# Patient Record
Sex: Male | Born: 1960 | Race: Black or African American | Hispanic: No | Marital: Single | Smoking: Current every day smoker
Health system: Southern US, Community
[De-identification: ages and names within clinical notes are randomized; demographics above are authoritative.]

## PROBLEM LIST (undated history)

## (undated) DIAGNOSIS — E785 Hyperlipidemia, unspecified: Secondary | ICD-10-CM

## (undated) DIAGNOSIS — I10 Essential (primary) hypertension: Secondary | ICD-10-CM

## (undated) DIAGNOSIS — I517 Cardiomegaly: Secondary | ICD-10-CM

## (undated) DIAGNOSIS — G4730 Sleep apnea, unspecified: Secondary | ICD-10-CM

## (undated) DIAGNOSIS — K219 Gastro-esophageal reflux disease without esophagitis: Secondary | ICD-10-CM

## (undated) DIAGNOSIS — G4733 Obstructive sleep apnea (adult) (pediatric): Secondary | ICD-10-CM

## (undated) DIAGNOSIS — I209 Angina pectoris, unspecified: Secondary | ICD-10-CM

## (undated) DIAGNOSIS — E042 Nontoxic multinodular goiter: Secondary | ICD-10-CM

## (undated) DIAGNOSIS — R9431 Abnormal electrocardiogram [ECG] [EKG]: Secondary | ICD-10-CM

## (undated) DIAGNOSIS — Z0000 Encounter for general adult medical examination without abnormal findings: Secondary | ICD-10-CM

## (undated) DIAGNOSIS — N189 Chronic kidney disease, unspecified: Secondary | ICD-10-CM

## (undated) DIAGNOSIS — Z8719 Personal history of other diseases of the digestive system: Secondary | ICD-10-CM

## (undated) DIAGNOSIS — K649 Unspecified hemorrhoids: Secondary | ICD-10-CM

## (undated) DIAGNOSIS — R079 Chest pain, unspecified: Secondary | ICD-10-CM

## (undated) DIAGNOSIS — R42 Dizziness and giddiness: Secondary | ICD-10-CM

## (undated) DIAGNOSIS — E119 Type 2 diabetes mellitus without complications: Secondary | ICD-10-CM

## (undated) HISTORY — DX: Obstructive sleep apnea (adult) (pediatric): G47.33

## (undated) HISTORY — PX: POLYPECTOMY: SHX149

## (undated) HISTORY — DX: Essential (primary) hypertension: I10

## (undated) HISTORY — DX: Personal history of other diseases of the digestive system: Z87.19

## (undated) HISTORY — DX: Chest pain, unspecified: R07.9

## (undated) HISTORY — DX: Hyperlipidemia, unspecified: E78.5

## (undated) HISTORY — DX: Dizziness and giddiness: R42

## (undated) HISTORY — DX: Cardiomegaly: I51.7

## (undated) HISTORY — DX: Nontoxic multinodular goiter: E04.2

## (undated) HISTORY — PX: COLONOSCOPY: SHX174

## (undated) HISTORY — DX: Sleep apnea, unspecified: G47.30

## (undated) HISTORY — DX: Gastro-esophageal reflux disease without esophagitis: K21.9

## (undated) HISTORY — DX: Abnormal electrocardiogram (ECG) (EKG): R94.31

## (undated) HISTORY — DX: Type 2 diabetes mellitus without complications: E11.9

## (undated) HISTORY — DX: Unspecified hemorrhoids: K64.9

## (undated) HISTORY — DX: Encounter for general adult medical examination without abnormal findings: Z00.00

---

## 2002-01-07 ENCOUNTER — Inpatient Hospital Stay (HOSPITAL_COMMUNITY): Admission: EM | Admit: 2002-01-07 | Discharge: 2002-01-08 | Payer: Self-pay | Admitting: *Deleted

## 2002-03-15 ENCOUNTER — Emergency Department (HOSPITAL_COMMUNITY): Admission: EM | Admit: 2002-03-15 | Discharge: 2002-03-15 | Payer: Self-pay | Admitting: Emergency Medicine

## 2002-03-27 ENCOUNTER — Emergency Department (HOSPITAL_COMMUNITY): Admission: EM | Admit: 2002-03-27 | Discharge: 2002-03-27 | Payer: Self-pay | Admitting: Emergency Medicine

## 2002-03-27 ENCOUNTER — Encounter: Payer: Self-pay | Admitting: Emergency Medicine

## 2002-05-10 DIAGNOSIS — R42 Dizziness and giddiness: Secondary | ICD-10-CM

## 2002-05-10 HISTORY — DX: Dizziness and giddiness: R42

## 2006-08-11 DIAGNOSIS — R9431 Abnormal electrocardiogram [ECG] [EKG]: Secondary | ICD-10-CM

## 2006-08-11 HISTORY — DX: Abnormal electrocardiogram (ECG) (EKG): R94.31

## 2007-06-08 ENCOUNTER — Emergency Department (HOSPITAL_COMMUNITY): Admission: EM | Admit: 2007-06-08 | Discharge: 2007-06-08 | Payer: Self-pay | Admitting: Emergency Medicine

## 2007-06-08 IMAGING — CR DG CHEST 1V PORT
1 series · 1 of 1 positions shown · non-contrast
Comparison: Report of a prior study [DATE]

CLINICAL DATA: Chest pain.
 PORTABLE CHEST - 1 VIEW:

[AP]
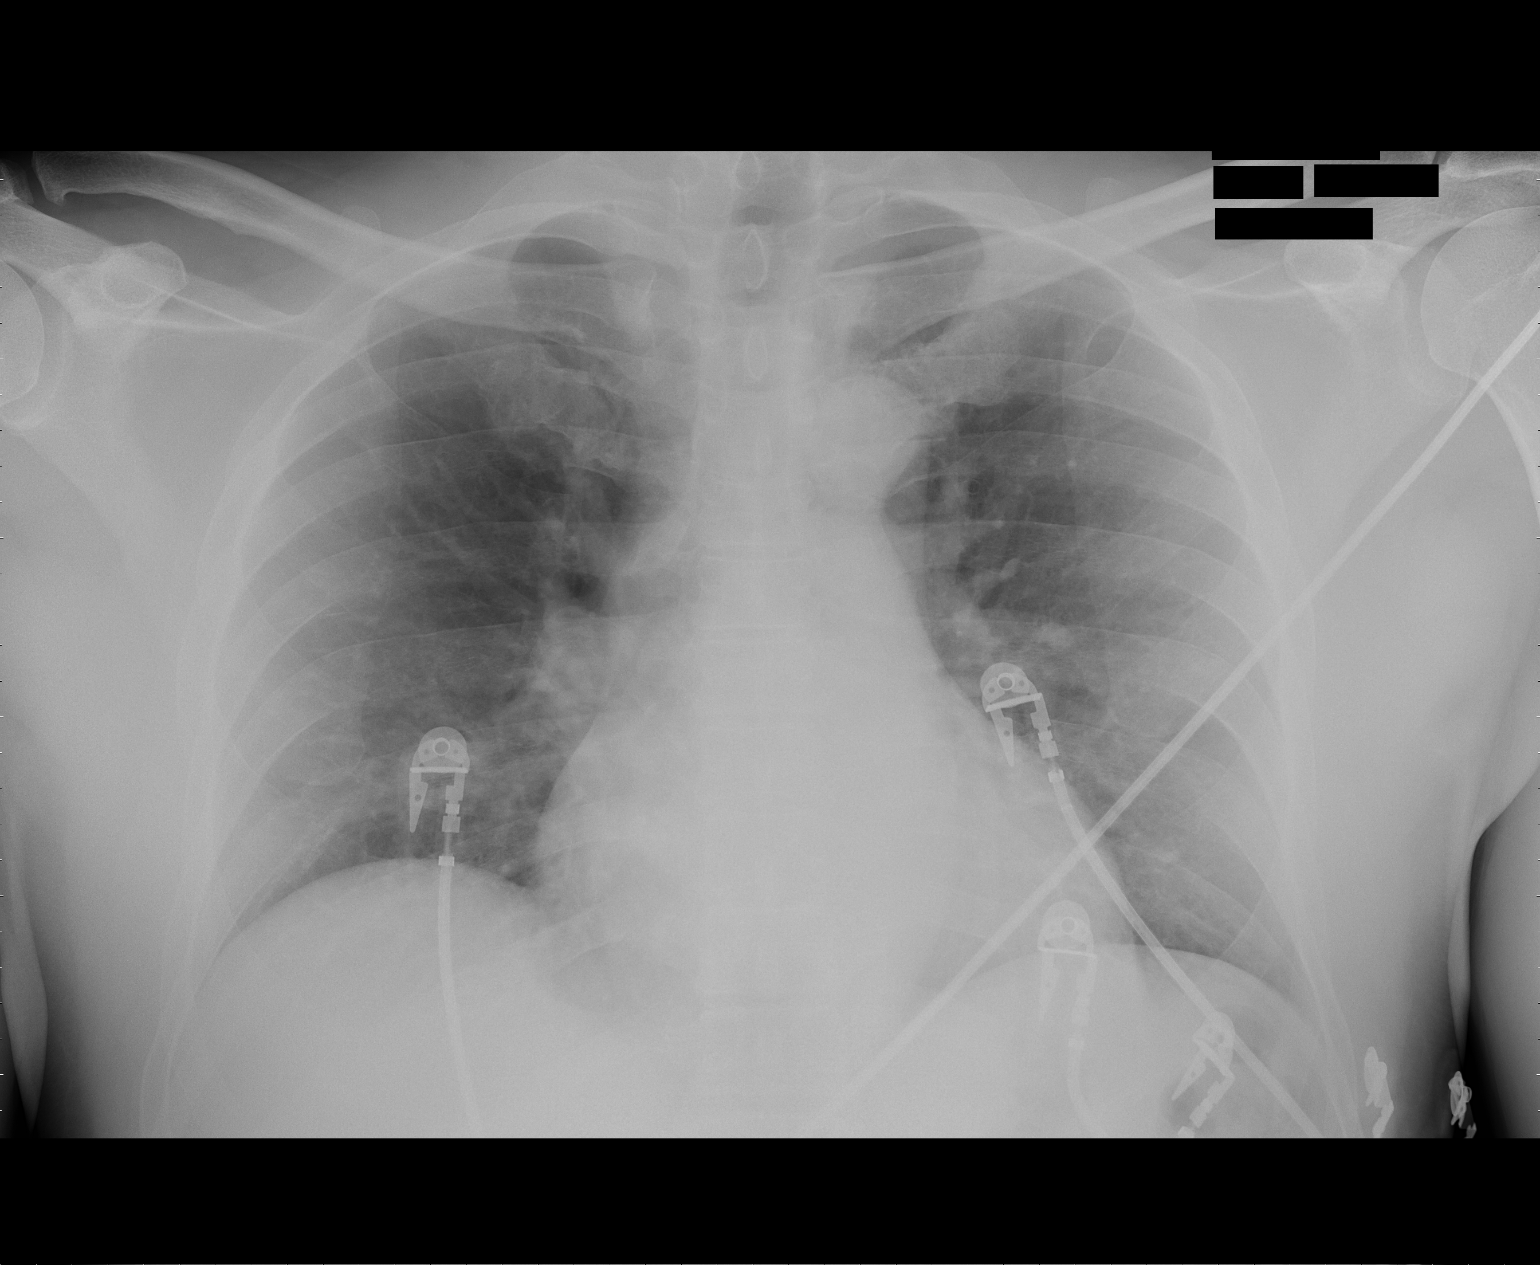

[1 of 1 positions shown; findings below may reference images not displayed]

FINDINGS: Suboptimal level of inspiration.  Heart size within normal limits considering low level of inspiration and AP projection.  Lungs clear.  No heart failure.
IMPRESSION: Suboptimal inspiration ? no active disease.

## 2007-06-09 DIAGNOSIS — R079 Chest pain, unspecified: Secondary | ICD-10-CM

## 2007-06-09 HISTORY — DX: Chest pain, unspecified: R07.9

## 2008-01-14 ENCOUNTER — Ambulatory Visit: Payer: Self-pay | Admitting: Internal Medicine

## 2008-01-14 DIAGNOSIS — G4733 Obstructive sleep apnea (adult) (pediatric): Secondary | ICD-10-CM | POA: Insufficient documentation

## 2008-01-14 DIAGNOSIS — I10 Essential (primary) hypertension: Secondary | ICD-10-CM

## 2008-01-14 HISTORY — DX: Essential (primary) hypertension: I10

## 2008-01-14 HISTORY — DX: Obstructive sleep apnea (adult) (pediatric): G47.33

## 2008-01-14 LAB — CONVERTED CEMR LAB
ALT: 30 units/L (ref 0–53)
AST: 26 units/L (ref 0–37)
Albumin: 4.1 g/dL (ref 3.5–5.2)
Alkaline Phosphatase: 84 units/L (ref 39–117)
BUN: 10 mg/dL (ref 6–23)
Basophils Absolute: 0 10*3/uL (ref 0.0–0.1)
Basophils Relative: 0.7 % (ref 0.0–3.0)
Bilirubin Urine: NEGATIVE
Bilirubin, Direct: 0.2 mg/dL (ref 0.0–0.3)
CO2: 30 meq/L (ref 19–32)
Calcium: 9.3 mg/dL (ref 8.4–10.5)
Chloride: 108 meq/L (ref 96–112)
Cholesterol: 204 mg/dL (ref 0–200)
Creatinine, Ser: 0.9 mg/dL (ref 0.4–1.5)
Crystals: NEGATIVE
Direct LDL: 117.9 mg/dL
Eosinophils Absolute: 0.4 10*3/uL (ref 0.0–0.7)
Eosinophils Relative: 6.2 % — ABNORMAL HIGH (ref 0.0–5.0)
GFR calc Af Amer: 116 mL/min
GFR calc non Af Amer: 96 mL/min
Glucose, Bld: 102 mg/dL — ABNORMAL HIGH (ref 70–99)
HCT: 43.8 % (ref 39.0–52.0)
HDL: 42.2 mg/dL (ref >39.0)
Hemoglobin, Urine: NEGATIVE
Hemoglobin: 15 g/dL (ref 13.0–17.0)
Ketones, ur: NEGATIVE mg/dL
Lymphocytes Relative: 36.3 % (ref 12.0–46.0)
MCHC: 34.1 g/dL (ref 30.0–36.0)
MCV: 95.6 fL (ref 78.0–100.0)
Monocytes Absolute: 1 10*3/uL (ref 0.1–1.0)
Monocytes Relative: 16 % — ABNORMAL HIGH (ref 3.0–12.0)
Neutro Abs: 2.5 10*3/uL (ref 1.4–7.7)
Neutrophils Relative %: 40.8 % — ABNORMAL LOW (ref 43.0–77.0)
Nitrite: NEGATIVE
PSA: 0.52 ng/mL (ref 0.10–4.00)
Platelets: 250 10*3/uL (ref 150–400)
Potassium: 3.8 meq/L (ref 3.5–5.1)
RBC / HPF: NONE SEEN
RBC: 4.58 M/uL (ref 4.22–5.81)
RDW: 13 % (ref 11.5–14.6)
Sodium: 143 meq/L (ref 135–145)
Specific Gravity, Urine: 1.025 (ref 1.000–1.03)
TSH: 0.74 microintl units/mL (ref 0.35–5.50)
Total Bilirubin: 0.8 mg/dL (ref 0.3–1.2)
Total CHOL/HDL Ratio: 4.8
Total Protein, Urine: NEGATIVE mg/dL
Total Protein: 6.9 g/dL (ref 6.0–8.3)
Triglycerides: 131 mg/dL (ref 0–149)
Urine Glucose: NEGATIVE mg/dL
Urobilinogen, UA: 0.2 (ref 0.0–1.0)
VLDL: 26 mg/dL (ref 0–40)
WBC: 6.1 10*3/uL (ref 4.5–10.5)
pH: 5.5 (ref 5.0–8.0)

## 2008-08-13 DIAGNOSIS — G4730 Sleep apnea, unspecified: Secondary | ICD-10-CM

## 2008-08-13 HISTORY — DX: Sleep apnea, unspecified: G47.30

## 2008-10-20 ENCOUNTER — Observation Stay (HOSPITAL_COMMUNITY): Admission: EM | Admit: 2008-10-20 | Discharge: 2008-10-23 | Payer: Self-pay | Admitting: Emergency Medicine

## 2008-10-20 IMAGING — CR DG CHEST 2V
2 series · 2 of 2 positions shown · non-contrast
Comparison: [DATE]

CLINICAL DATA: Chest pain

CHEST - 2 VIEW

[w chest pa]
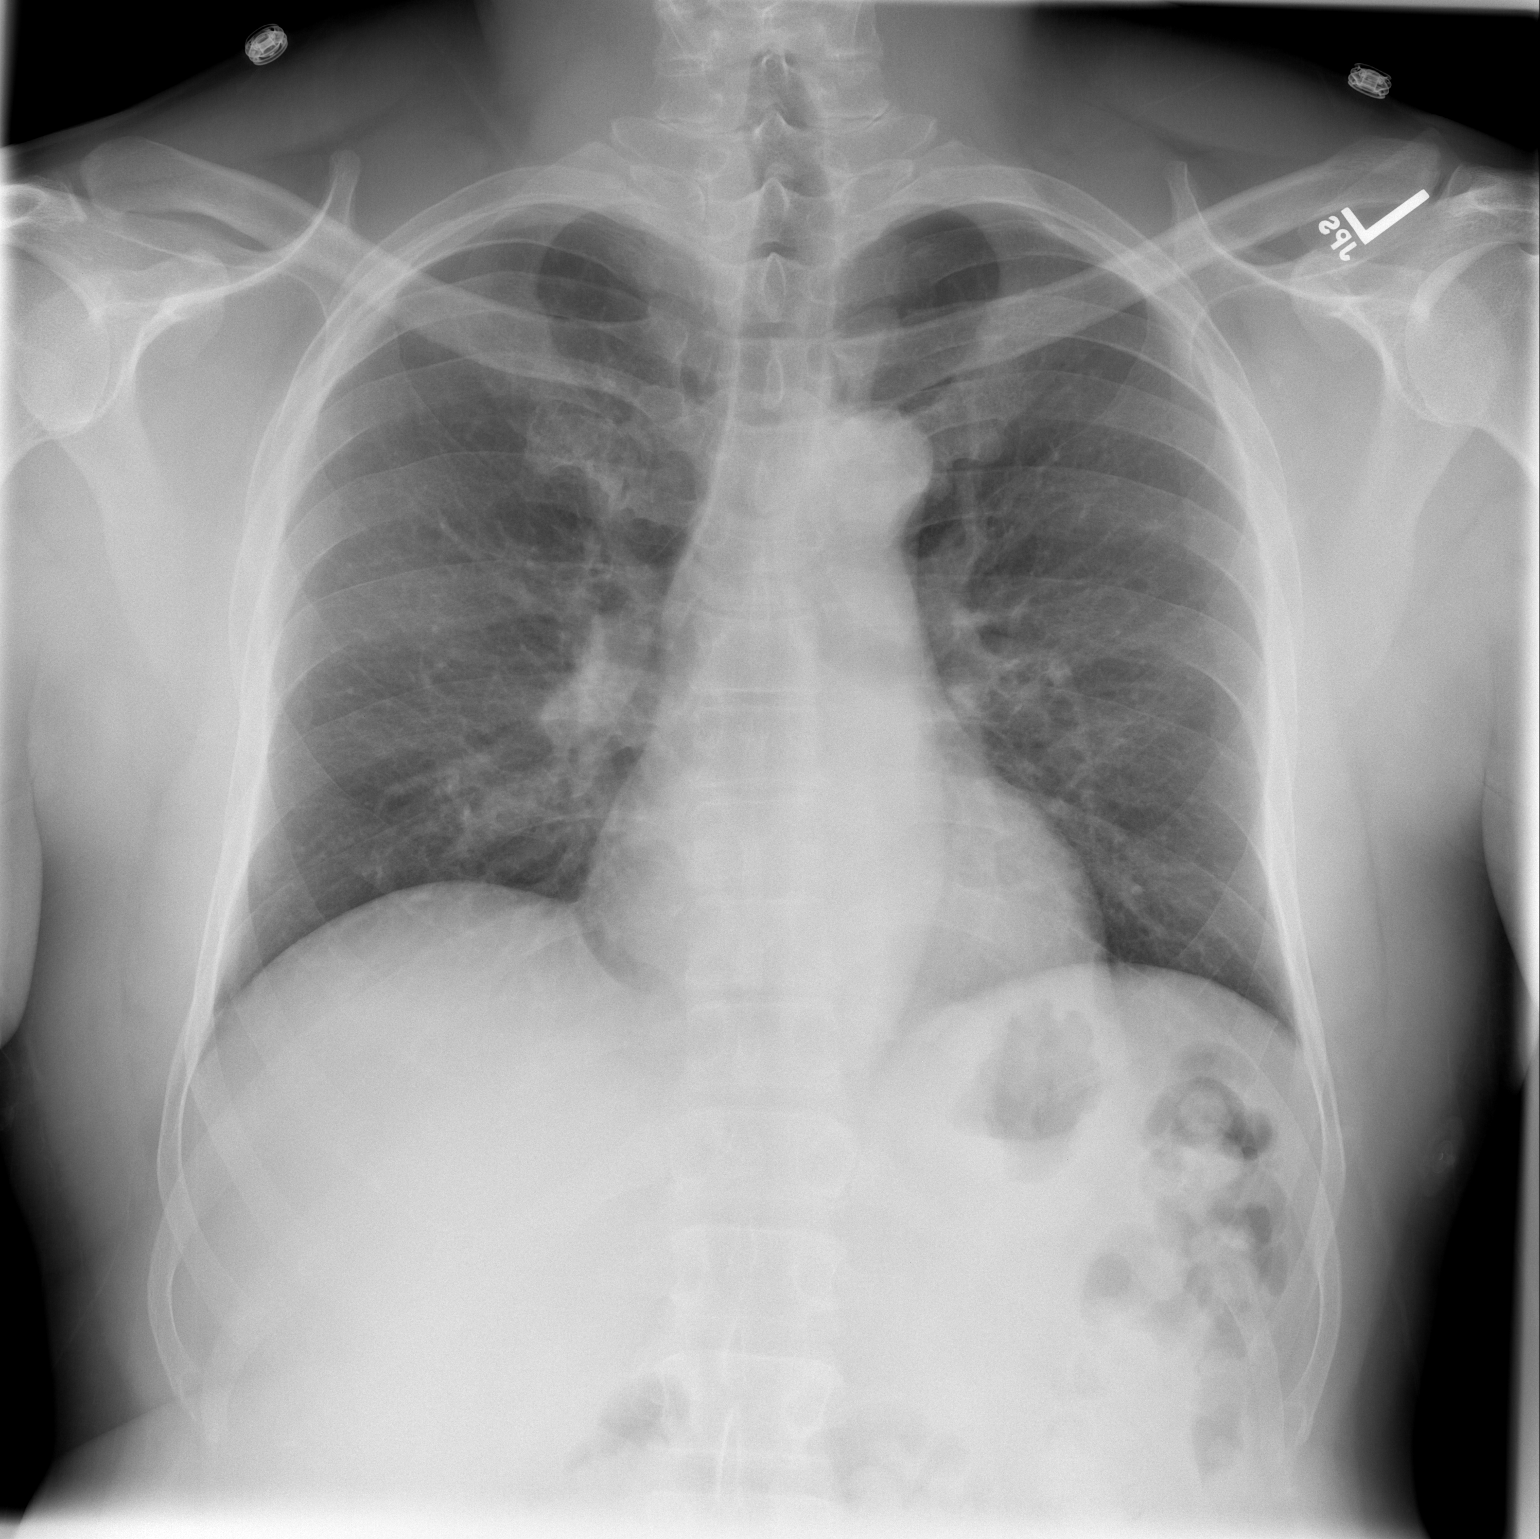

[w chest lat]
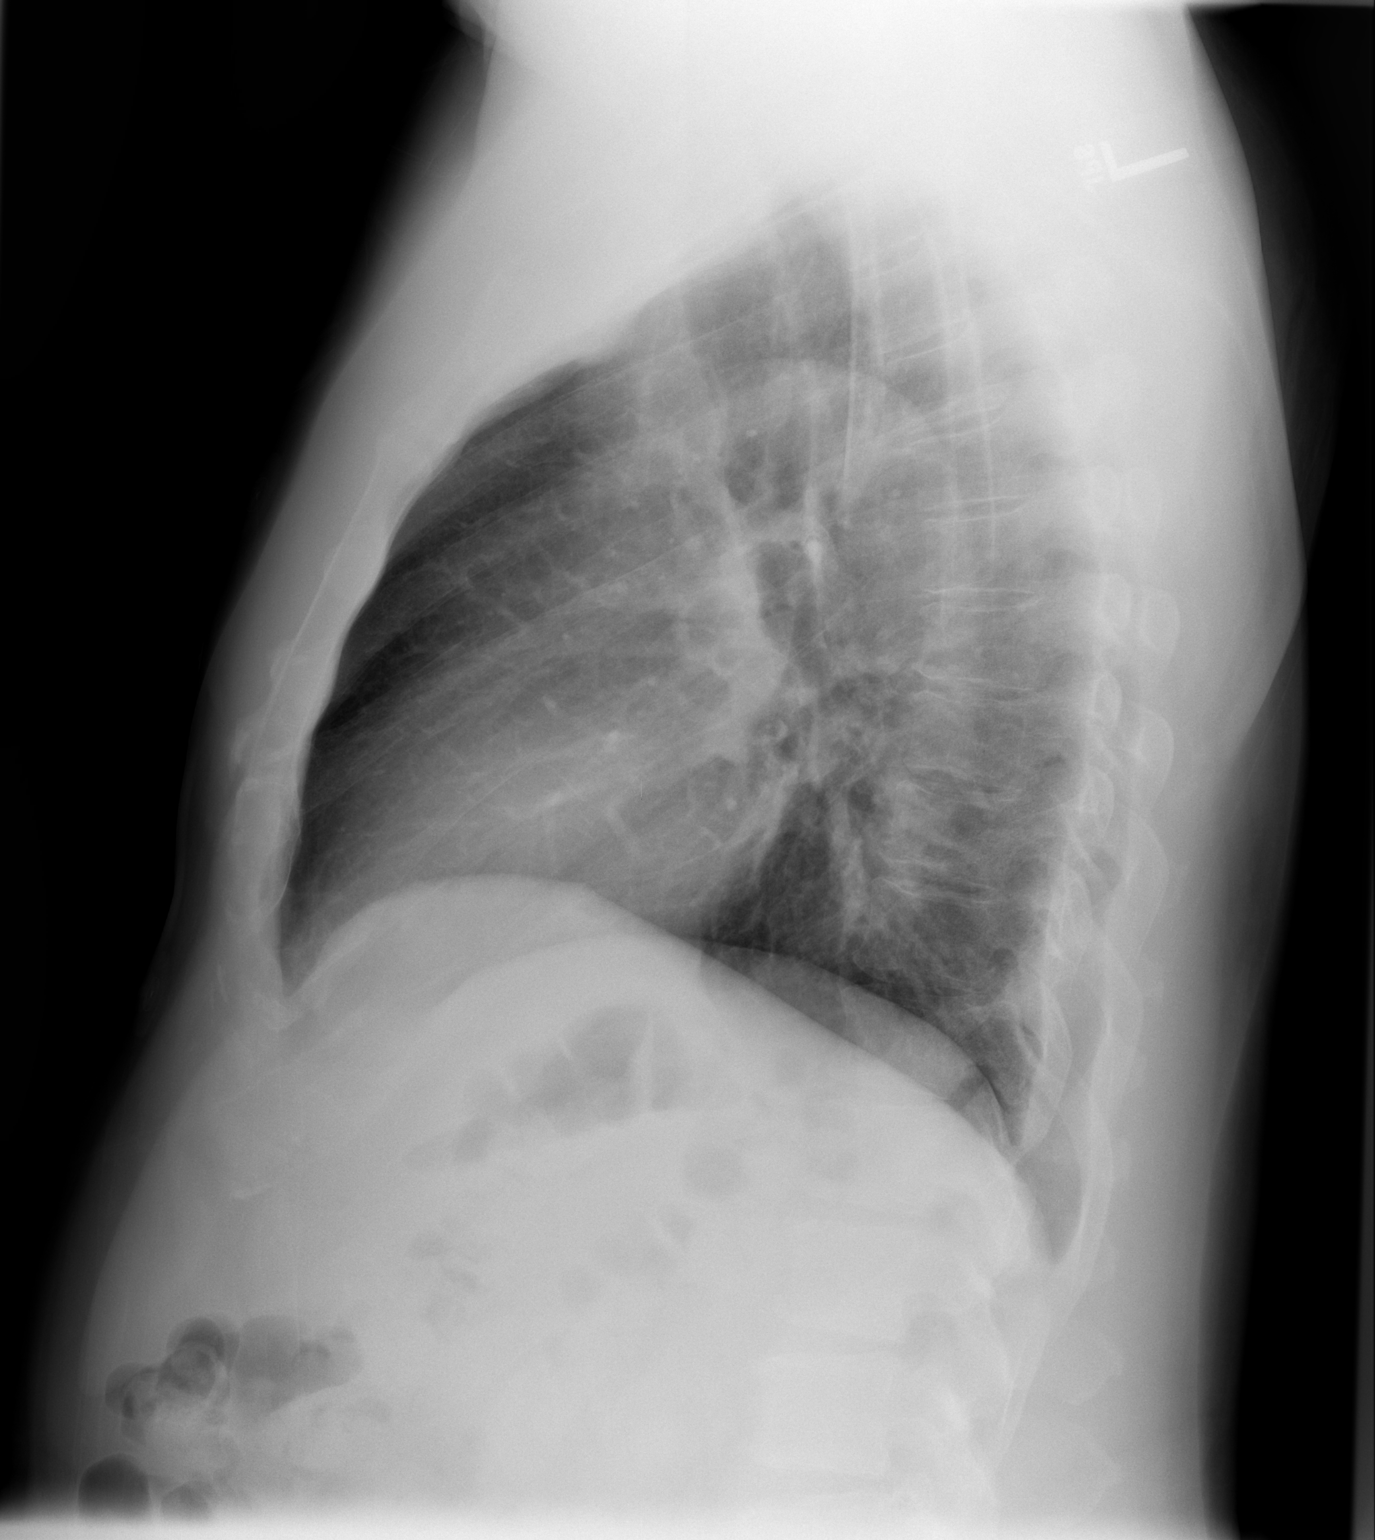

[2 of 2 positions shown; findings below may reference images not displayed]

FINDINGS: Cardiomediastinal silhouette is stable.  No acute
infiltrate or pleural effusion.  No pulmonary edema.  Bony thorax
is stable.
IMPRESSION: No active disease.  No significant change.

## 2008-10-23 HISTORY — PX: CARDIAC CATHETERIZATION: SHX172

## 2009-08-14 ENCOUNTER — Encounter: Payer: Self-pay | Admitting: Internal Medicine

## 2009-08-24 ENCOUNTER — Ambulatory Visit: Payer: Self-pay | Admitting: Internal Medicine

## 2009-08-25 LAB — CONVERTED CEMR LAB
ALT: 38 units/L (ref 0–53)
AST: 28 units/L (ref 0–37)
Albumin: 4.4 g/dL (ref 3.5–5.2)
Alkaline Phosphatase: 89 units/L (ref 39–117)
BUN: 12 mg/dL (ref 6–23)
Basophils Absolute: 0.1 10*3/uL (ref 0.0–0.1)
Basophils Relative: 0.8 % (ref 0.0–3.0)
Bilirubin, Direct: 0.1 mg/dL (ref 0.0–0.3)
CO2: 28 meq/L (ref 19–32)
Calcium: 9.4 mg/dL (ref 8.4–10.5)
Chloride: 110 meq/L (ref 96–112)
Cholesterol: 200 mg/dL (ref 0–200)
Creatinine, Ser: 0.9 mg/dL (ref 0.4–1.5)
Eosinophils Absolute: 0.3 10*3/uL (ref 0.0–0.7)
Eosinophils Relative: 4.9 % (ref 0.0–5.0)
GFR calc non Af Amer: 113.29 mL/min (ref >60)
Glucose, Bld: 130 mg/dL — ABNORMAL HIGH (ref 70–99)
HCT: 43.1 % (ref 39.0–52.0)
HDL: 42.1 mg/dL (ref >39.00)
Hemoglobin: 14.9 g/dL (ref 13.0–17.0)
LDL Cholesterol: 132 mg/dL — ABNORMAL HIGH (ref 0–99)
Lymphocytes Relative: 31.1 % (ref 12.0–46.0)
Lymphs Abs: 2.2 10*3/uL (ref 0.7–4.0)
MCHC: 34.6 g/dL (ref 30.0–36.0)
MCV: 95.7 fL (ref 78.0–100.0)
Monocytes Absolute: 0.8 10*3/uL (ref 0.1–1.0)
Monocytes Relative: 10.7 % (ref 3.0–12.0)
Neutro Abs: 3.7 10*3/uL (ref 1.4–7.7)
Neutrophils Relative %: 52.5 % (ref 43.0–77.0)
PSA: 0.32 ng/mL (ref 0.10–4.00)
Platelets: 263 10*3/uL (ref 150.0–400.0)
Potassium: 3.9 meq/L (ref 3.5–5.1)
RBC: 4.5 M/uL (ref 4.22–5.81)
RDW: 13.6 % (ref 11.5–14.6)
Sodium: 144 meq/L (ref 135–145)
TSH: 0.59 microintl units/mL (ref 0.35–5.50)
Total Bilirubin: 0.8 mg/dL (ref 0.3–1.2)
Total CHOL/HDL Ratio: 5
Total Protein: 7 g/dL (ref 6.0–8.3)
Triglycerides: 132 mg/dL (ref 0.0–149.0)
VLDL: 26.4 mg/dL (ref 0.0–40.0)
WBC: 7 10*3/uL (ref 4.5–10.5)

## 2009-08-29 ENCOUNTER — Ambulatory Visit: Payer: Self-pay | Admitting: Internal Medicine

## 2009-08-29 DIAGNOSIS — E739 Lactose intolerance, unspecified: Secondary | ICD-10-CM | POA: Insufficient documentation

## 2009-08-29 DIAGNOSIS — E785 Hyperlipidemia, unspecified: Secondary | ICD-10-CM

## 2009-08-29 DIAGNOSIS — E119 Type 2 diabetes mellitus without complications: Secondary | ICD-10-CM | POA: Insufficient documentation

## 2009-08-29 HISTORY — DX: Hyperlipidemia, unspecified: E78.5

## 2009-08-29 HISTORY — DX: Type 2 diabetes mellitus without complications: E11.9

## 2009-08-31 ENCOUNTER — Encounter (INDEPENDENT_AMBULATORY_CARE_PROVIDER_SITE_OTHER): Payer: Self-pay | Admitting: *Deleted

## 2009-09-03 ENCOUNTER — Telehealth: Payer: Self-pay | Admitting: Internal Medicine

## 2009-09-17 ENCOUNTER — Encounter: Payer: Self-pay | Admitting: Internal Medicine

## 2009-09-21 ENCOUNTER — Encounter (INDEPENDENT_AMBULATORY_CARE_PROVIDER_SITE_OTHER): Payer: Self-pay | Admitting: *Deleted

## 2009-09-24 ENCOUNTER — Ambulatory Visit: Payer: Self-pay | Admitting: Gastroenterology

## 2009-10-04 ENCOUNTER — Ambulatory Visit: Payer: Self-pay | Admitting: Gastroenterology

## 2009-10-04 LAB — HM COLONOSCOPY

## 2009-10-09 ENCOUNTER — Encounter: Payer: Self-pay | Admitting: Gastroenterology

## 2009-10-29 ENCOUNTER — Encounter: Payer: Self-pay | Admitting: Internal Medicine

## 2009-12-05 ENCOUNTER — Ambulatory Visit: Payer: Self-pay | Admitting: Internal Medicine

## 2009-12-05 DIAGNOSIS — IMO0002 Reserved for concepts with insufficient information to code with codable children: Secondary | ICD-10-CM | POA: Insufficient documentation

## 2010-02-04 ENCOUNTER — Ambulatory Visit: Payer: Self-pay | Admitting: Internal Medicine

## 2010-02-04 LAB — CONVERTED CEMR LAB
CO2: 26 meq/L (ref 19–32)
Chloride: 107 meq/L (ref 96–112)
Glucose, Bld: 144 mg/dL — ABNORMAL HIGH (ref 70–99)
HDL: 42.1 mg/dL (ref >39.00)
Hgb A1c MFr Bld: 7.2 % — ABNORMAL HIGH (ref 4.6–6.5)
LDL Cholesterol: 109 mg/dL — ABNORMAL HIGH (ref 0–99)
Potassium: 3.9 meq/L (ref 3.5–5.1)
Sodium: 144 meq/L (ref 135–145)
Total CHOL/HDL Ratio: 4
VLDL: 22.8 mg/dL (ref 0.0–40.0)

## 2010-02-06 ENCOUNTER — Ambulatory Visit: Payer: Self-pay | Admitting: Internal Medicine

## 2010-02-06 DIAGNOSIS — R51 Headache: Secondary | ICD-10-CM | POA: Insufficient documentation

## 2010-02-06 DIAGNOSIS — R519 Headache, unspecified: Secondary | ICD-10-CM | POA: Insufficient documentation

## 2010-02-18 ENCOUNTER — Ambulatory Visit: Payer: Self-pay | Admitting: Internal Medicine

## 2010-03-04 ENCOUNTER — Ambulatory Visit: Payer: Self-pay | Admitting: Internal Medicine

## 2010-04-28 LAB — CONVERTED CEMR LAB: Hgb A1c MFr Bld: 6.8 % — ABNORMAL HIGH (ref 4.6–6.5)

## 2010-04-30 NOTE — Miscellaneous (Signed)
Summary: [facility]  Clinical Lists Changes  Medications: Added new medication of MOVIPREP 100 GM  SOLR (PEG-KCL-NACL-NASULF-NA ASC-C) As per prep instructions. - Signed Rx of MOVIPREP 100 GM  SOLR (PEG-KCL-NACL-NASULF-NA ASC-C) As per prep instructions.;  #1 x 0;  Signed;  Entered by: Ezra Sites RN;  Authorized by: Louis Meckel MD;  Method used: [facility]  [address] [phone]*, [address] [place], [place], [place]  [address], Ph: [phone] or [phone], Fax: [phone] Observations: Added new observation of NKA: T (09/24/2009 16:20)    Prescriptions: MOVIPREP 100 GM  SOLR (PEG-KCL-NACL-NASULF-NA ASC-C) As per prep instructions.  #1 x 0   Entered by:   Ezra Sites RN   Authorized by:   Louis Meckel MD   Signed by:   Ezra Sites RN on 09/24/2009   Method used:   [facility]        [facility]  [address] [phone]* (retail)       [address]       [place], [place]  [address]       Ph: [phone] or [phone]       Fax: [phone]   RxID:   [id number]

## 2010-04-30 NOTE — Letter (Signed)
Summary: Previsit letter  [facility]  [address] [place], [place] [address]   Phone: [phone]  Fax: [phone]       08/31/2009 MRN: [id number]  [facility] [address] [place], [place]  [address]  Dear [name],  Welcome to the Gastroenterology Division at [facility].    You are scheduled to see a nurse for your pre-procedure visit on 09-24-09 at 4:30p.m. on the 3rd floor at [facility], [address]  We ask that you try to arrive at our office 15 minutes prior to your appointment time to allow for check-in.  Your nurse visit will consist of discussing your medical and surgical history, your immediate family medical history, and your medications.    Please bring a complete list of all your medications or, if you prefer, bring the medication bottles and we will list them.  We will need to be aware of both prescribed and over the counter drugs.  We will need to know exact dosage information as well.  If you are on blood thinners (Coumadin, Plavix, Aggrenox, Ticlid, etc.) please call our office today/prior to your appointment, as we need to consult with your physician about holding your medication.   Please be prepared to read and sign documents such as consent forms, a financial agreement, and acknowledgement forms.  If necessary, and with your consent, a friend or relative is welcome to sit-in on the nurse visit with you.  Please bring your insurance card so that we may make a copy of it.  If your insurance requires a referral to see a specialist, please bring your referral form from your primary care physician.  No co-pay is required for this nurse visit.     If you cannot keep your appointment, please call [phone] to cancel or reschedule prior to your appointment date.  This allows [place] the opportunity to schedule an appointment for another patient in need of care.    Thank you for choosing [facility] for your  medical needs.  We appreciate the opportunity to care for you.  Please visit [place] at our website  to learn more about our practice.                     Sincerely.                                                                                                                   The Gastroenterology Division
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
## 2010-04-30 NOTE — Progress Notes (Signed)
  Phone Note Call from Patient   Caller: Patient Summary of Call: Patient called upon receiving letter to call office about labs. Informed patient of lab information that has new diabetes no new medication at this time. Dr. Jonny Ruiz request to see pt for ROV in 6 months (not one year) with labs prior hgba1c, Lipids, BMET 250.02 (scheduled 03/04/2010) and pt did schedule ROV for December 2011. Initial call taken by: Scharlene Gloss,  September 03, 2009 10:10 AM

## 2010-04-30 NOTE — Letter (Signed)
Summary: Patient Notice- Polyp Results  [facility]  [address] [place], [place] [address]   Phone: [phone]  Fax: [phone]        October 09, 2009 MRN: [id number]    [facility] [address] [place], [place]  [address]    Dear [name],  I am pleased to inform you that the colon polyp(s) removed during your recent colonoscopy was (were) found to be benign (no cancer detected) upon pathologic examination.  I recommend you have a repeat colonoscopy examination in 3_ years to look for recurrent polyps, as having colon polyps increases your risk for having recurrent polyps or even colon cancer in the future.  Should you develop new or worsening symptoms of abdominal pain, bowel habit changes or bleeding from the rectum or bowels, please schedule an evaluation with either your primary care physician or with me.  Additional information/recommendations:  __ No further action with gastroenterology is needed at this time. Please      follow-up with your primary care physician for your other healthcare      needs.  __ Please call [phone] to schedule a return visit to review your      situation.  __ Please keep your follow-up visit as already scheduled.  _x_ Continue treatment plan as outlined the day of your exam.  Please call us if you are having persistent problems or have questions about your condition that have not been fully answered at this time.  Sincerely,  Jonathan Meckel MD  This letter has been electronically signed by your physician.  Appended Document: Patient Notice- Polyp Results letter mailed.

## 2010-04-30 NOTE — Procedures (Signed)
Summary: Colonoscopy  Patient: [name] Note: All result statuses are Final unless otherwise noted.  Tests: (1) Colonoscopy (COL)   COL Colonoscopy           DONE     [facility]     [address]     [place], [place]  [address]           COLONOSCOPY PROCEDURE REPORT           PATIENT:  [name], [name]  MR#:  [id number]     BIRTHDATE:  03/26/1961, 49 yrs. old  GENDER:  male           ENDOSCOPIST:  Barbette Hair. Arlyce Dice, MD     Referred by:  Oliver Barre, M.D.           PROCEDURE DATE:  10/04/2009     PROCEDURE:  Colonoscopy with biopsy, Colonoscopy with snare     polypectomy     ASA CLASS:  Class II     INDICATIONS:  1) Routine Risk Screening           MEDICATIONS:   Fentanyl 50 mcg IV, Versed 5 mg IV           DESCRIPTION OF PROCEDURE:   After the risks benefits and     alternatives of the procedure were thoroughly explained, informed     consent was obtained.  Digital rectal exam was performed and     revealed no abnormalities.   The [id number] endoscope was     introduced through the anus and advanced to the cecum, which was     identified by both the appendix and ileocecal valve, without     limitations.  The quality of the prep was excellent, using     MoviPrep.  The instrument was then slowly withdrawn as the colon     was fully examined.     <<PROCEDUREIMAGES>>           FINDINGS:  A pedunculated polyp was found in the descending colon.     It was 2 cm in size. Polyp was snared, then cauterized with     monopolar cautery. Retrieval was successful (see image11). snare     polyp  A sessile polyp was found in the descending colon. It was 3     mm in size. Polyp was snared without cautery. Retrieval was     successful (see image12). snare polyp  other finding in the     rectum. Enlarged but soft rectal folds just inside anal canal. Bxs     taken (see image17 and image16).  This was otherwise a normal     examination of the colon (see image3, image4,  image6, image8,     image9, image13, and image14).   Retroflexed views in the rectum     revealed no abnormalities.    The time to cecum =  2.0  minutes.     The scope was then withdrawn (time =  11.75  min) from the patient     and the procedure completed.           COMPLICATIONS:  None           ENDOSCOPIC IMPRESSION:     1) 2 cm pedunculated polyp in the descending colon     2) 3 mm sessile polyp in the descending colon     3) Other finding in the rectum     4) Otherwise normal examination  RECOMMENDATIONS:     1) Colonoscopy in 3 years in view of polyp size           REPEAT EXAM:  In 3 year(s) for Colonoscopy.           ______________________________     Barbette Hair. Arlyce Dice, MD           CC:           n.     eSIGNED:   Barbette Hair. Lurine Imel at 10/04/2009 11:50 AM           Page 2 of 3   [place], [place], [id number]  Note: An exclamation mark (!) indicates a result that was not dispersed into the flowsheet. Document Creation Date: 10/04/2009 11:51 AM _______________________________________________________________________  (1) Order result status: Final Collection or observation date-time: 10/04/2009 11:44 Requested date-time:  Receipt date-time:  Reported date-time:  Referring Physician:   Ordering Physician: Melvia Heaps [phone]) Specimen Source:  Source: Launa Grill Order Number: [id number] Lab site:   Appended Document: Colonoscopy     Procedures Next Due Date:    Colonoscopy: 09/2012

## 2010-04-30 NOTE — Assessment & Plan Note (Signed)
Summary: CPX/UNITED HC/#/CD   Vital Signs:  Patient profile:   50 year old male Height:      73 inches Weight:      214.75 pounds BMI:     28.44 O2 Sat:      98 % on Room air Temp:     99.2 degrees F oral Pulse rate:   70 / minute BP sitting:   130 / 82  (left arm) Cuff size:   large  Vitals Entered ByZella Ball Ewing (August 29, 2009 10:45 AM)  O2 Flow:  Room air  CC: Adult Physical/RE   CC:  Adult Physical/RE.  History of Present Illness: here to f/u - on antibx per urology /dr nesi with f/u june 20;  overall o/w donig well, Pt denies CP, sob, doe, wheezing, orthopnea, pnd, worsening LE edema, palps, dizziness or syncope  Pt denies new neuro symptoms such as headache, facial or extremity weakness  Denies polydipsia, polyuria  Preventive Screening-Counseling & Management      Drug Use:  no.    Problems Prior to Update: 1)  Diabetes Mellitus, Type II  (ICD-250.00) 2)  Glucose Intolerance  (ICD-271.3) 3)  Hyperlipidemia  (ICD-272.4) 4)  Preventive Health Care  (ICD-V70.0) 5)  Sleep Apnea, Obstructive  (ICD-327.23) 6)  Hypertension  (ICD-401.9)  Medications Prior to Update: 1)  Lotrel 10-20 Mg Caps (Amlodipine Besy-Benazepril Hcl) .Marland Kitchen.. 1 By Mouth Daily 2)  Bystolic 2.5 Mg Tabs (Nebivolol Hcl) .Marland Kitchen.. 1 By Mouth Daily 3)  Adult Aspirin Ec Low Strength 81 Mg Tbec (Aspirin) .Marland Kitchen.. 1po Once Daily 4)  Cpap At Night  Current Medications (verified): 1)  Lotrel 10-20 Mg Caps (Amlodipine Besy-Benazepril Hcl) .Marland Kitchen.. 1 By Mouth Daily 2)  Bystolic 2.5 Mg Tabs (Nebivolol Hcl) .Marland Kitchen.. 1 By Mouth Daily 3)  Adult Aspirin Ec Low Strength 81 Mg Tbec (Aspirin) .Marland Kitchen.. 1po Once Daily 4)  Cpap At Night 5)  Pravachol 20 Mg Tabs (Pravastatin Sodium) .Marland Kitchen.. 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 mother wtih HTN father with MI - died January 20, 20189yo  Social History: Reviewed history from 01/14/2008 and no changes required. 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         all otherwise negative per pt -    Physical Exam  General:  alert and overweight-appearing.   Head:  normocephalic and atraumatic.   Eyes:  vision grossly intact, pupils equal, and pupils round.   Ears:  R ear normal and L ear normal.   Nose:  no external deformity and no  nasal discharge.   Mouth:  no gingival abnormalities and pharynx pink and moist.   Neck:  supple and no masses.   Lungs:  normal respiratory effort and normal breath sounds.   Heart:  normal rate and regular rhythm.   Abdomen:  soft, non-tender, and normal bowel sounds.   Msk:  no joint tenderness and no joint swelling.   Extremities:  no edema, no erythema  Neurologic:  cranial nerves II-XII intact and strength normal in all extremities.     Impression & Recommendations:  Problem # 1:   PREVENTIVE HEALTH CARE (ICD-V70.0)  Overall doing well, age appropriate education and counseling updated and referral for appropriate preventive services done unless declined, immunizations up to date or declined, diet counseling done if overweight, urged to quit smoking if smokes , most recent labs reviewed and current ordered if appropriate, ecg reviewed or declined (interpretation per ECG scanned in the EMR if done); information regarding Medicare Prevention requirements given if appropriate; speciality referrals updated as appropriate   Orders: EKG w/ Interpretation (93000) Gastroenterology Referral (GI)  Problem # 2:  HYPERLIPIDEMIA (ICD-272.4) Assessment: Improved  His updated medication list for this problem includes:    Pravachol 20 Mg Tabs (Pravastatin sodium) .Marland Kitchen... 1 by mouth once daily treat as above, f/u any worsening signs or symptoms   Labs Reviewed: SGOT: 28 (08/24/2009)   SGPT: 38 (08/24/2009)   HDL:42.10 (08/24/2009), 42.2 (01/14/2008)  LDL:132 (08/24/2009), DEL (01/14/2008)  Chol:200 (08/24/2009), 204 (01/14/2008)  Trig:132.0 (08/24/2009), 131 (01/14/2008)  Problem # 3:  GLUCOSE INTOLERANCE (ICD-271.3) asympt - to check a1c Orders: TLB-A1C / Hgb A1C (Glycohemoglobin) (83036-A1C)  Complete Medication List: 1)  Lotrel 10-20 Mg Caps (Amlodipine besy-benazepril hcl) .Marland Kitchen.. 1 by mouth daily 2)  Bystolic 2.5 Mg Tabs (Nebivolol hcl) .Marland Kitchen.. 1 by mouth daily 3)  Adult Aspirin Ec Low Strength 81 Mg Tbec (Aspirin) .Marland Kitchen.. 1po once daily 4)  Cpap At Night  5)  Pravachol 20 Mg Tabs (Pravastatin sodium) .Marland Kitchen.. 1 by mouth once daily  Other Orders: Tdap => 45yrs IM (21308) Admin 1st Vaccine (65784)  Patient Instructions: 1)  you had the tetanus shot today 2)  You will be contacted about the referral(s) to: colonoscopy 3)  Please go to the Lab in the basement for your blood  tests today  - the [facility] 4)  start the pravachol at 20 mg per day 5)  Continue all previous medications as before  this visit 6)  Please schedule a follow-up appointment in 1 year or sooner if needed Prescriptions: BYSTOLIC 2.5 MG TABS (NEBIVOLOL HCL) 1 by mouth daily  #90 x 3   Entered and Authorized by:   Corwin Levins MD   Signed by:   Corwin Levins MD on 08/29/2009   Method used:   Print then Give to Patient   RxID:   [id number] LOTREL 10-20 MG CAPS (AMLODIPINE BESY-BENAZEPRIL HCL) 1 by mouth daily  #90 x 3   Entered and Authorized by:   Corwin Levins MD   Signed by:   Corwin Levins MD on 08/29/2009   Method used:   Print then Give to Patient   RxID:   [id number] PRAVACHOL 20 MG TABS (PRAVASTATIN SODIUM) 1 by mouth once daily  #90 x 3   Entered and Authorized by:   Corwin Levins MD   Signed by:   Corwin Levins MD on 08/29/2009   Method used:   Print then Give to Patient   RxID:  [id number]    Immunizations Administered:  Tetanus Vaccine:    Vaccine Type: Tdap    Site: left deltoid    Mfr: GlaxoSmithKline    Dose: 0.5 ml    Route: IM    Given by: Robin Ewing    Exp. Date: 06/23/2011    Lot #: [id number]    VIS given: 02/16/07 version given August 29, 2009.

## 2010-04-30 NOTE — Letter (Signed)
Summary: [facility]  [facility]   Imported By: Lester [place] 08/22/2009 10:35:44  _____________________________________________________________________  External Attachment:    Type:   Image     Comment:   External Document

## 2010-04-30 NOTE — Letter (Signed)
Summary: [facility] Instructions  [facility]  [address] [place], [place] [address]   Phone: [phone]  Fax: [phone]       [name]    1960-05-24    MRN: [id number]        Procedure Day /Date: Thursday 10-04-09     Arrival Time: 10:00 a.m.     Procedure Time: 11:00 a.m.     Location of Procedure:                    _x_  [facility] (4th Floor)                        PREPARATION FOR COLONOSCOPY WITH MOVIPREP   Starting 5 days prior to your procedure Sat. 09-29-09 do not eat nuts, seeds, popcorn, corn, beans, peas,  salads, or any raw vegetables.  Do not take any fiber supplements (e.g. Metamucil, Citrucel, and Benefiber).  THE DAY BEFORE YOUR PROCEDURE         DATE:  10-03-09  DAY:  Wednesday  1.  Drink clear liquids the entire day-NO SOLID FOOD  2.  Do not drink anything colored red or purple.  Avoid juices with pulp.  No orange juice.  3.  Drink at least 64 oz. (8 glasses) of fluid/clear liquids during the day to prevent dehydration and help the prep work efficiently.  CLEAR LIQUIDS INCLUDE: Water Jello Ice Popsicles Tea (sugar ok, no milk/cream) Powdered fruit flavored drinks Coffee (sugar ok, no milk/cream) Gatorade Juice: apple, white grape, white cranberry  Lemonade Clear bullion, consomm, broth Carbonated beverages (any kind) Strained chicken noodle soup Hard Candy                             4.  In the morning, mix first dose of MoviPrep solution:    Empty 1 Pouch A and 1 Pouch B into the disposable container    Add lukewarm drinking water to the top line of the container. Mix to dissolve    Refrigerate (mixed solution should be used within 24 hrs)  5.  Begin drinking the prep at 5:00 p.m. The MoviPrep container is divided by 4 marks.   Every 15 minutes drink the solution down to the next mark (approximately 8 oz) until the full liter is complete.   6.  Follow completed prep with 16 oz of clear liquid of your  choice (Nothing red or purple).  Continue to drink clear liquids until bedtime.  7.  Before going to bed, mix second dose of MoviPrep solution:    Empty 1 Pouch A and 1 Pouch B into the disposable container    Add lukewarm drinking water to the top line of the container. Mix to dissolve    Refrigerate  THE DAY OF YOUR PROCEDURE      DATE:  10-04-09  DAY:  Thursday  Beginning at  6:00 a.m. (5 hours before procedure):         1. Every 15 minutes, drink the solution down to the next mark (approx 8 oz) until the full liter is complete.  2. Follow completed prep with 16 oz. of clear liquid of your choice.    3. You may drink clear liquids until  9:00 a.m. (2 HOURS BEFORE PROCEDURE).   MEDICATION INSTRUCTIONS  Unless otherwise instructed, you should take regular prescription medications with a small sip of water  as early as possible the morning of your procedure.           OTHER INSTRUCTIONS  You will need a responsible adult at least 51 years of age to accompany you and drive you home.   This person must remain in the waiting room during your procedure.  Wear loose fitting clothing that is easily removed.  Leave jewelry and other valuables at home.  However, you may wish to bring a book to read or  an iPod/MP3 player to listen to music as you wait for your procedure to start.  Remove all body piercing jewelry and leave at home.  Total time from sign-in until discharge is approximately 2-3 hours.  You should go home directly after your procedure and rest.  You can resume normal activities the  day after your procedure.  The day of your procedure you should not:   Drive   Make legal decisions   Operate machinery   Drink alcohol   Return to work  You will receive specific instructions about eating, activities and medications before you leave.    The above instructions have been reviewed and explained to me by   Ezra Sites RN  September 24, 2009 4:46 PM     I  fully understand and can verbalize these instructions _____________________________ Date _________

## 2010-04-30 NOTE — Letter (Signed)
Summary: [facility]  [facility]   Imported By: Lester [place] 11/08/2009 09:51:01  _____________________________________________________________________  External Attachment:    Type:   Image     Comment:   External Document

## 2010-04-30 NOTE — Letter (Signed)
Summary: [facility]  [facility]   Imported By: Lester [place] 09/19/2009 09:44:46  _____________________________________________________________________  External Attachment:    Type:   Image     Comment:   External Document
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
## 2010-04-30 NOTE — Letter (Signed)
Summary: Generic Letter  [facility]  [address] [place], [place] [address]   Phone: [phone]  Fax: [phone]    08/31/2009  [facility] [address] [place], [place]  [address]  Dear Mr. Losada,   I have tried several times to reach you by phone and have been unable. Dr. Jonny Ruiz does have your lab results back and could you please call our office so we can inform you of your results. Our number is [phone] and you can ask for Robin.        Sincerely,   Robin Ewing

## 2010-07-07 LAB — CBC
HCT: 44.7 % (ref 39.0–52.0)
Hemoglobin: 14.1 g/dL (ref 13.0–17.0)
Hemoglobin: 14.9 g/dL (ref 13.0–17.0)
MCHC: 33.4 g/dL (ref 30.0–36.0)
MCV: 94.8 fL (ref 78.0–100.0)
RBC: 4.16 MIL/uL — ABNORMAL LOW (ref 4.22–5.81)
RBC: 4.36 MIL/uL (ref 4.22–5.81)
RDW: 13.6 % (ref 11.5–15.5)
WBC: 6.3 10*3/uL (ref 4.0–10.5)
WBC: 7.2 10*3/uL (ref 4.0–10.5)

## 2010-07-07 LAB — POCT CARDIAC MARKERS
CKMB, poc: 1.6 ng/mL (ref 1.0–8.0)
CKMB, poc: 2.4 ng/mL (ref 1.0–8.0)
Myoglobin, poc: 119 ng/mL (ref 12–200)
Troponin i, poc: <0.05 ng/mL (ref 0.00–0.09)
Troponin i, poc: <0.05 ng/mL (ref 0.00–0.09)

## 2010-07-07 LAB — TROPONIN I: Troponin I: 0.01 ng/mL (ref 0.00–0.06)

## 2010-07-07 LAB — BASIC METABOLIC PANEL
CO2: 26 mEq/L (ref 19–32)
Calcium: 9.6 mg/dL (ref 8.4–10.5)
Chloride: 108 mEq/L (ref 96–112)
GFR calc Af Amer: >60 mL/min (ref >60)
GFR calc non Af Amer: >60 mL/min (ref >60)
Glucose, Bld: 117 mg/dL — ABNORMAL HIGH (ref 70–99)
Potassium: 3.7 mEq/L (ref 3.5–5.1)
Sodium: 141 mEq/L (ref 135–145)
Sodium: 142 mEq/L (ref 135–145)

## 2010-07-07 LAB — CARDIAC PANEL(CRET KIN+CKTOT+MB+TROPI)
CK, MB: 3.9 ng/mL (ref 0.3–4.0)
Total CK: 400 U/L — ABNORMAL HIGH (ref 7–232)

## 2010-07-07 LAB — LIPID PANEL
Cholesterol: 180 mg/dL (ref 0–200)
HDL: 25 mg/dL — ABNORMAL LOW (ref >39)
LDL Cholesterol: UNDETERMINED mg/dL (ref 0–99)
Total CHOL/HDL Ratio: 7.2 RATIO

## 2010-07-07 LAB — CK TOTAL AND CKMB (NOT AT ARMC)
Relative Index: 1 (ref 0.0–2.5)
Relative Index: 1 (ref 0.0–2.5)
Total CK: 529 U/L — ABNORMAL HIGH (ref 7–232)

## 2010-07-07 LAB — DIFFERENTIAL
Lymphocytes Relative: 29 % (ref 12–46)
Monocytes Absolute: 0.8 10*3/uL (ref 0.1–1.0)
Monocytes Relative: 12 % (ref 3–12)
Neutro Abs: 3.3 10*3/uL (ref 1.7–7.7)
Neutrophils Relative %: 53 % (ref 43–77)

## 2010-07-07 LAB — MAGNESIUM: Magnesium: 2 mg/dL (ref 1.5–2.5)

## 2010-07-07 LAB — PROTIME-INR
INR: 1 (ref 0.00–1.49)
Prothrombin Time: 13.1 seconds (ref 11.6–15.2)

## 2010-07-22 ENCOUNTER — Telehealth: Payer: Self-pay

## 2010-07-22 NOTE — Telephone Encounter (Signed)
Blurred vision in his case more likely due to elevated blood sugar, where the metformin is not enough  If he can, he should check CBG at home, or can make nurse visit to check CBG here, or OV with me before his regular appt scheduled

## 2010-07-22 NOTE — Telephone Encounter (Signed)
Pt called stating he has developed blurred vision x 1-2 weeks. Pt says she believes it to be caused by Metformin even though he has been taking medication since 01/2010. Pt has been monitoring his BP and he says it is always within normal range. Please advise, pt has appt in 1 month.

## 2010-07-23 NOTE — Telephone Encounter (Signed)
Pt advised and states that he normally does not check his blood sugar at home. Pt will start monitoring and call back depending on reading if sooner appt is needed.

## 2010-07-23 NOTE — Telephone Encounter (Signed)
Noted  Ok to close note

## 2010-08-05 ENCOUNTER — Emergency Department (HOSPITAL_COMMUNITY): Payer: 59

## 2010-08-05 ENCOUNTER — Inpatient Hospital Stay (HOSPITAL_COMMUNITY)
Admission: EM | Admit: 2010-08-05 | Discharge: 2010-08-07 | DRG: 639 | Disposition: A | Discharge status: Home / Self Care | Payer: 59 | Attending: Internal Medicine | Admitting: Internal Medicine

## 2010-08-05 DIAGNOSIS — Z91199 Patient's noncompliance with other medical treatment and regimen due to unspecified reason: Secondary | ICD-10-CM

## 2010-08-05 DIAGNOSIS — E860 Dehydration: Secondary | ICD-10-CM | POA: Diagnosis present

## 2010-08-05 DIAGNOSIS — I10 Essential (primary) hypertension: Secondary | ICD-10-CM | POA: Diagnosis present

## 2010-08-05 DIAGNOSIS — Z9119 Patient's noncompliance with other medical treatment and regimen: Secondary | ICD-10-CM

## 2010-08-05 DIAGNOSIS — E1100 Type 2 diabetes mellitus with hyperosmolarity without nonketotic hyperglycemic-hyperosmolar coma (NKHHC): Principal | ICD-10-CM | POA: Diagnosis present

## 2010-08-05 DIAGNOSIS — F17200 Nicotine dependence, unspecified, uncomplicated: Secondary | ICD-10-CM | POA: Diagnosis present

## 2010-08-05 DIAGNOSIS — G4733 Obstructive sleep apnea (adult) (pediatric): Secondary | ICD-10-CM | POA: Diagnosis present

## 2010-08-05 DIAGNOSIS — K219 Gastro-esophageal reflux disease without esophagitis: Secondary | ICD-10-CM | POA: Diagnosis present

## 2010-08-05 DIAGNOSIS — E785 Hyperlipidemia, unspecified: Secondary | ICD-10-CM | POA: Diagnosis present

## 2010-08-05 DIAGNOSIS — E876 Hypokalemia: Secondary | ICD-10-CM | POA: Diagnosis present

## 2010-08-05 LAB — GLUCOSE, CAPILLARY
Glucose-Capillary: 562 mg/dL (ref 70–99)
Glucose-Capillary: 378 mg/dL — ABNORMAL HIGH (ref 70–99)
Glucose-Capillary: 290 mg/dL — ABNORMAL HIGH (ref 70–99)
Glucose-Capillary: 243 mg/dL — ABNORMAL HIGH (ref 70–99)
Glucose-Capillary: 190 mg/dL — ABNORMAL HIGH (ref 70–99)

## 2010-08-05 LAB — BASIC METABOLIC PANEL
BUN: 8 mg/dL (ref 6–23)
CO2: 25 mEq/L (ref 19–32)
Chloride: 107 mEq/L (ref 96–112)
GFR calc Af Amer: >60 mL/min (ref >60)
GFR calc non Af Amer: >60 mL/min (ref >60)
Glucose, Bld: 155 mg/dL — ABNORMAL HIGH (ref 70–99)
Glucose, Bld: 247 mg/dL — ABNORMAL HIGH (ref 70–99)
Potassium: 3.1 mEq/L — ABNORMAL LOW (ref 3.5–5.1)
Potassium: 3 mEq/L — ABNORMAL LOW (ref 3.5–5.1)
Sodium: 138 mEq/L (ref 135–145)

## 2010-08-05 LAB — DIFFERENTIAL
Basophils Absolute: 0 10*3/uL (ref 0.0–0.1)
Basophils Relative: 1 % (ref 0–1)
Eosinophils Relative: 2 % (ref 0–5)
Lymphocytes Relative: 18 % (ref 12–46)
Monocytes Absolute: 0.9 10*3/uL (ref 0.1–1.0)

## 2010-08-05 LAB — BLOOD GAS, VENOUS
Acid-Base Excess: 1.2 mmol/L (ref 0.0–2.0)
pCO2, Ven: 47.2 mmHg (ref 45.0–50.0)

## 2010-08-05 LAB — COMPREHENSIVE METABOLIC PANEL
ALT: 26 U/L (ref 0–53)
AST: 17 U/L (ref 0–37)
Albumin: 4.3 g/dL (ref 3.5–5.2)
CO2: 26 mEq/L (ref 19–32)
Calcium: 10.3 mg/dL (ref 8.4–10.5)
GFR calc Af Amer: >60 mL/min (ref >60)
Sodium: 130 mEq/L — ABNORMAL LOW (ref 135–145)
Total Protein: 7 g/dL (ref 6.0–8.3)

## 2010-08-05 LAB — TSH: TSH: 0.332 u[IU]/mL — ABNORMAL LOW (ref 0.350–4.500)

## 2010-08-05 LAB — URINALYSIS, ROUTINE W REFLEX MICROSCOPIC
Ketones, ur: NEGATIVE mg/dL
Leukocytes, UA: NEGATIVE
Nitrite: NEGATIVE
Specific Gravity, Urine: 1.039 — ABNORMAL HIGH (ref 1.005–1.030)
Urobilinogen, UA: 0.2 mg/dL (ref 0.0–1.0)
pH: 5 (ref 5.0–8.0)

## 2010-08-05 LAB — CBC
HCT: 42.6 % (ref 39.0–52.0)
MCHC: 34.7 g/dL (ref 30.0–36.0)
RDW: 11.6 % (ref 11.5–15.5)

## 2010-08-05 LAB — PROTIME-INR
INR: 1.09 (ref 0.00–1.49)
Prothrombin Time: 14.3 seconds (ref 11.6–15.2)

## 2010-08-05 LAB — CARDIAC PANEL(CRET KIN+CKTOT+MB+TROPI)
CK, MB: 3.9 ng/mL (ref 0.3–4.0)
Relative Index: 1.7 (ref 0.0–2.5)
Total CK: 227 U/L (ref 7–232)

## 2010-08-05 LAB — URINE MICROSCOPIC-ADD ON

## 2010-08-05 IMAGING — CR DG CHEST 2V
2 series · 2 of 2 positions shown · non-contrast
Comparison: [DATE]

CLINICAL DATA: Pain, fatigue, weakness.

CHEST - 2 VIEW

[w chest pa]
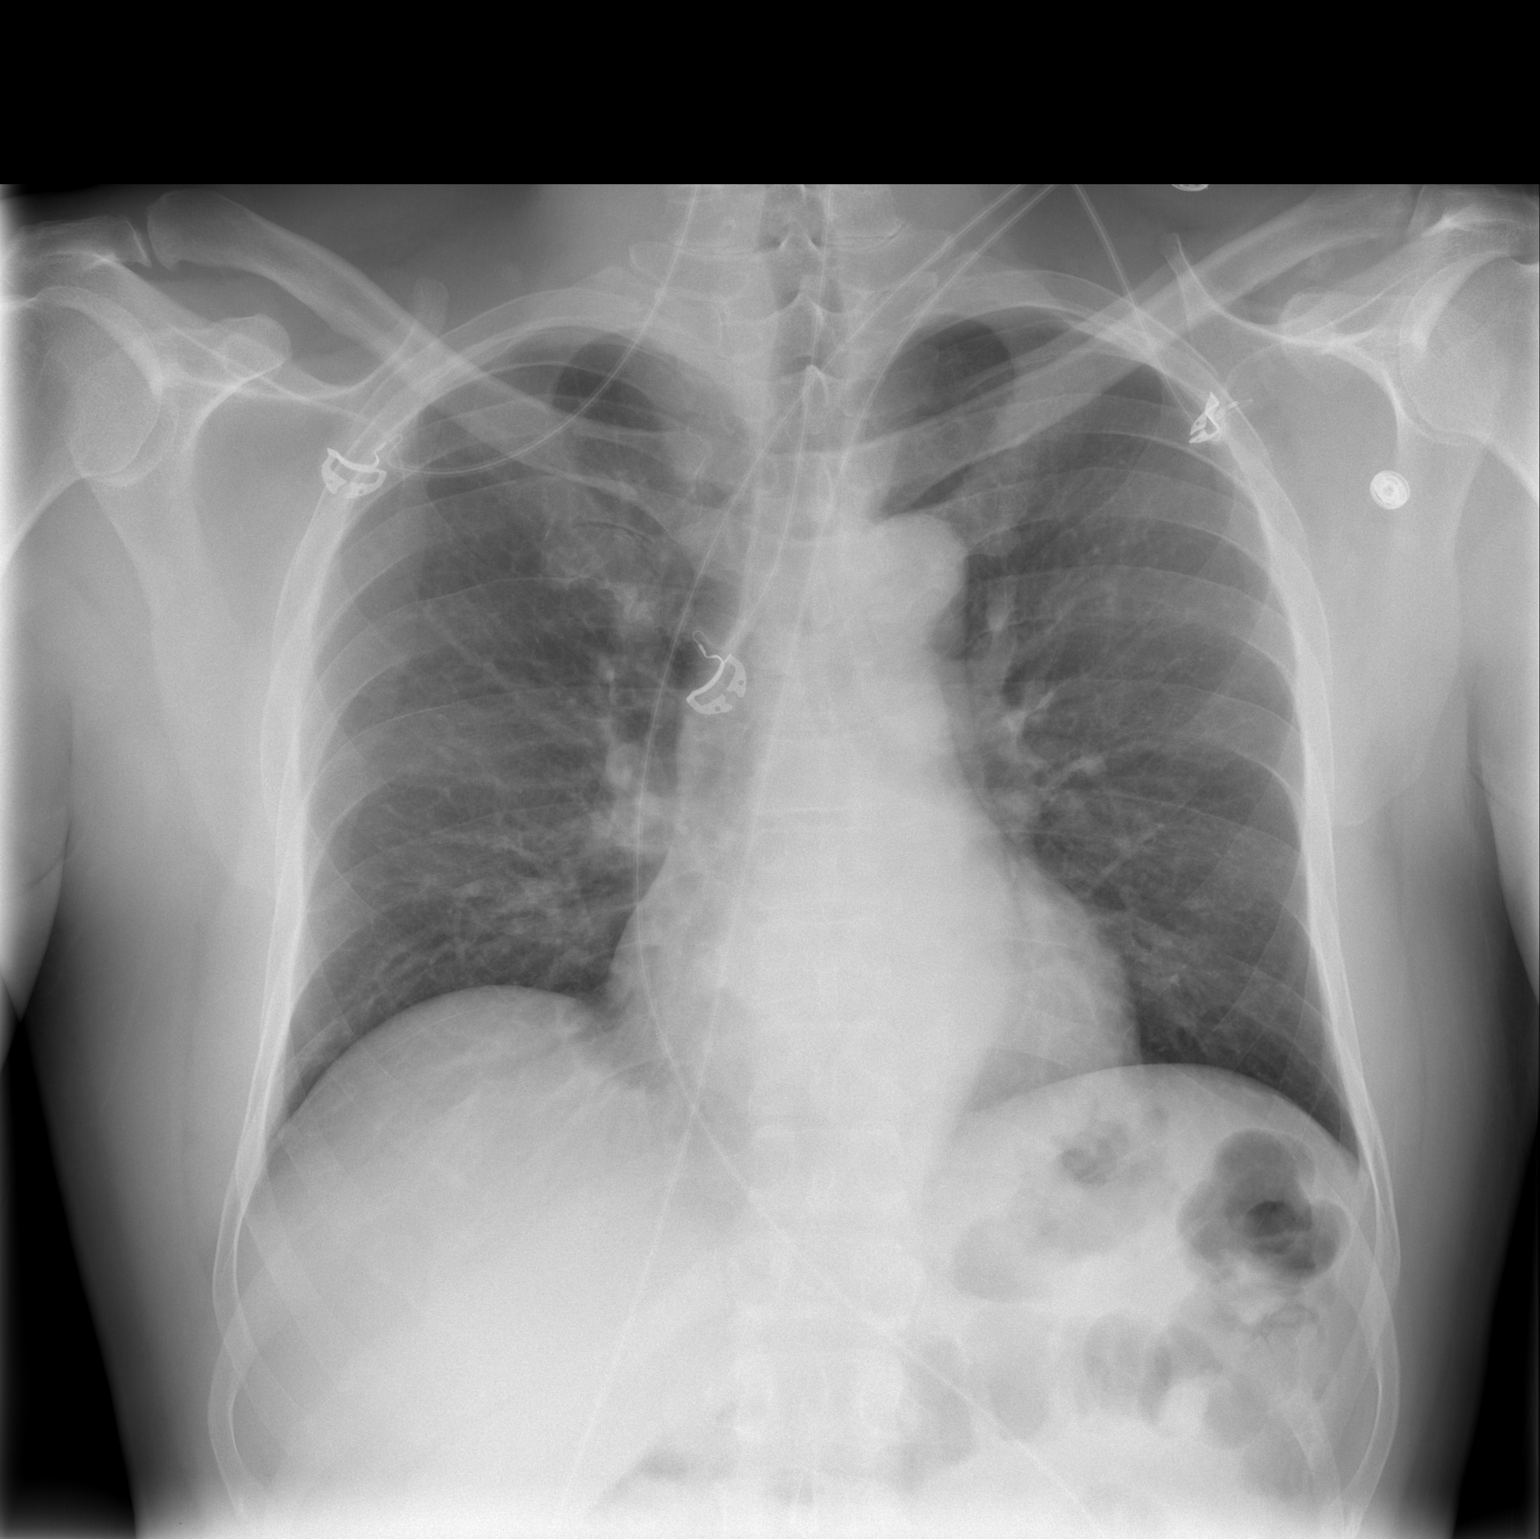

[w chest lat]
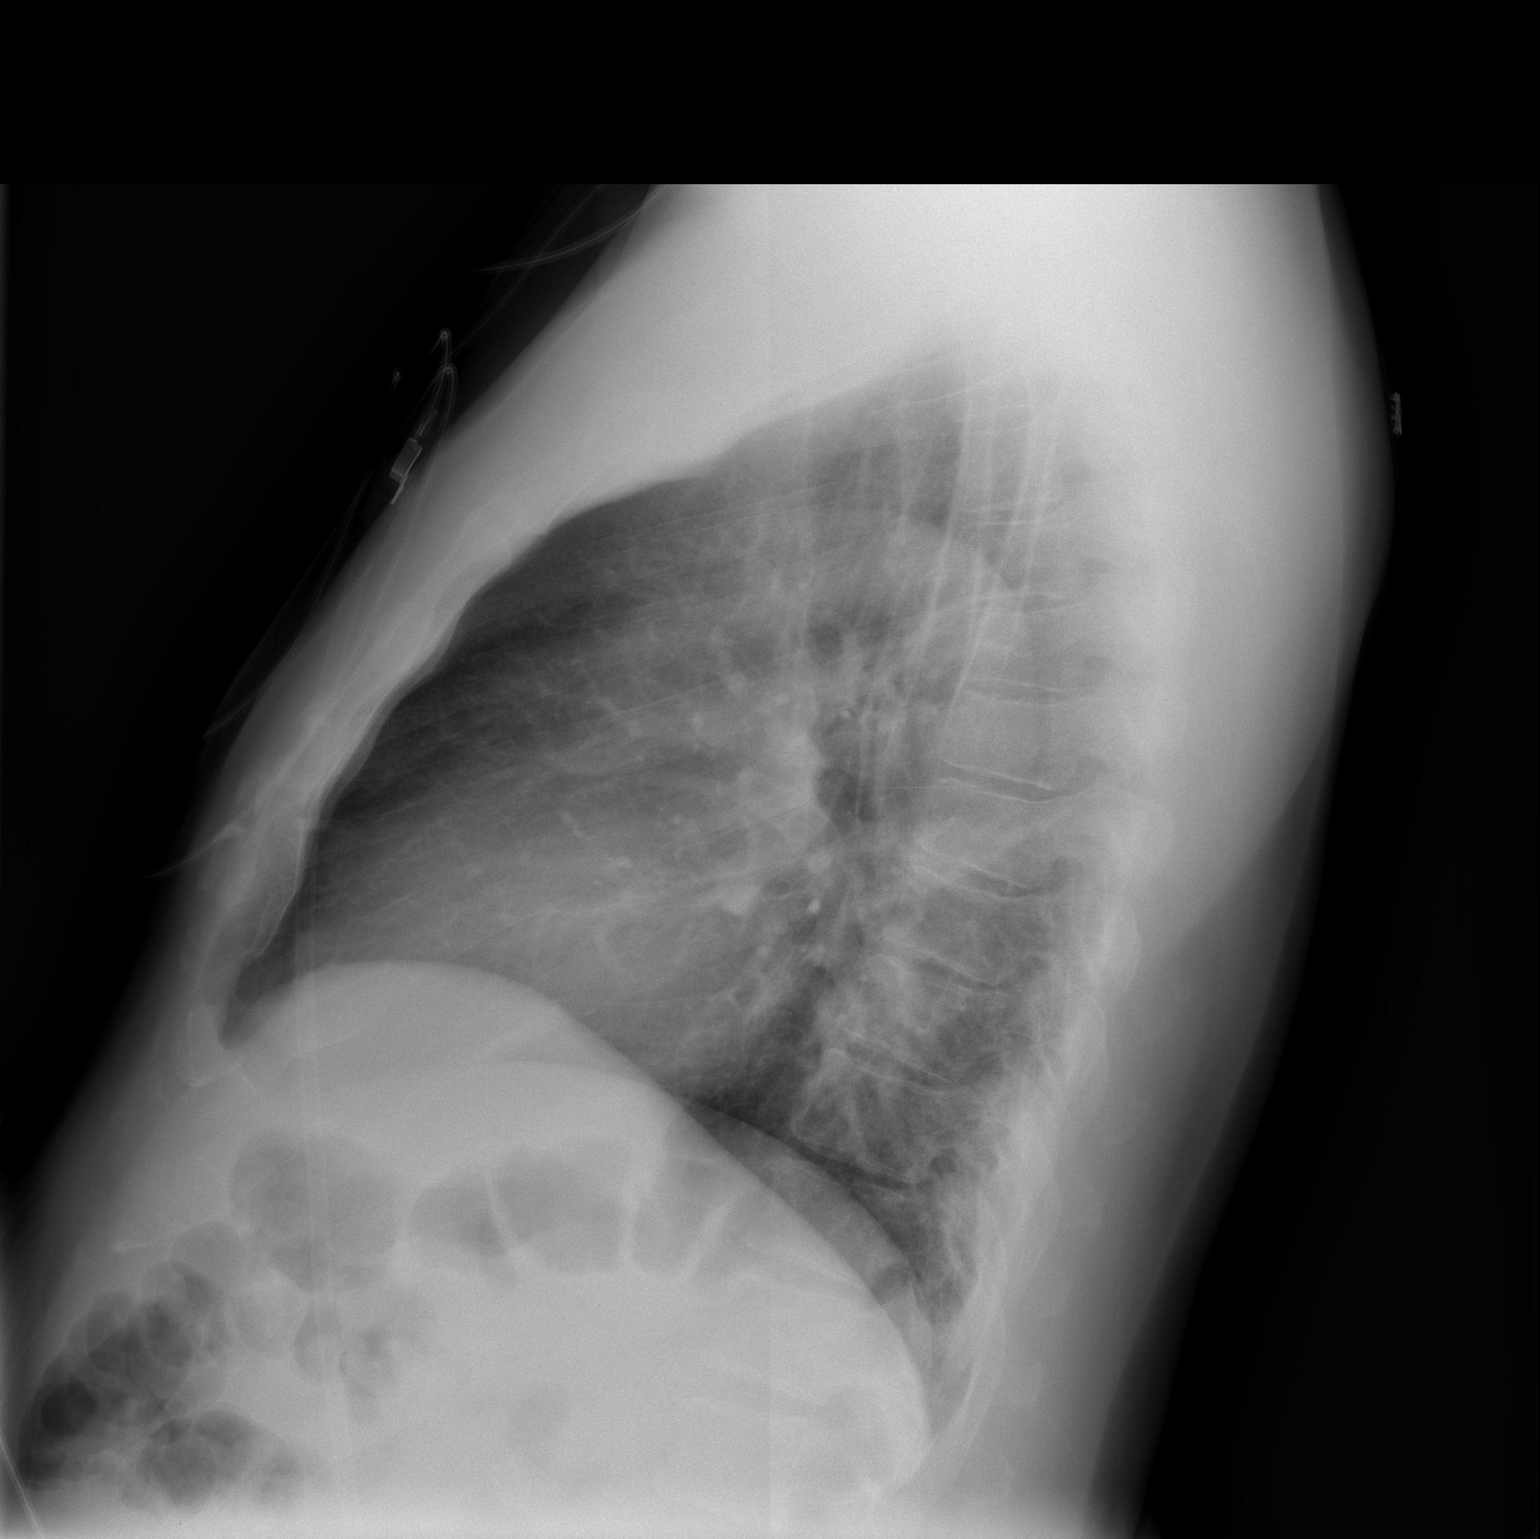

[2 of 2 positions shown; findings below may reference images not displayed]

FINDINGS: Heart and mediastinal contours are within normal limits.
No focal opacities or effusions.  No acute bony abnormality.
IMPRESSION: No active disease.

## 2010-08-06 ENCOUNTER — Other Ambulatory Visit: Payer: Self-pay

## 2010-08-06 ENCOUNTER — Other Ambulatory Visit: Payer: Self-pay | Admitting: Internal Medicine

## 2010-08-06 DIAGNOSIS — IMO0001 Reserved for inherently not codable concepts without codable children: Secondary | ICD-10-CM

## 2010-08-06 DIAGNOSIS — Z0000 Encounter for general adult medical examination without abnormal findings: Secondary | ICD-10-CM

## 2010-08-06 LAB — BASIC METABOLIC PANEL
CO2: 24 mEq/L (ref 19–32)
CO2: 22 mEq/L (ref 19–32)
CO2: 24 mEq/L (ref 19–32)
CO2: 23 mEq/L (ref 19–32)
Calcium: 8.8 mg/dL (ref 8.4–10.5)
Calcium: 8.4 mg/dL (ref 8.4–10.5)
Calcium: 9 mg/dL (ref 8.4–10.5)
Calcium: 8.9 mg/dL (ref 8.4–10.5)
Chloride: 102 mEq/L (ref 96–112)
Chloride: 102 mEq/L (ref 96–112)
Chloride: 101 mEq/L (ref 96–112)
Creatinine, Ser: 0.8 mg/dL (ref 0.4–1.5)
GFR calc Af Amer: >60 mL/min (ref >60)
GFR calc Af Amer: >60 mL/min (ref >60)
GFR calc Af Amer: >60 mL/min (ref >60)
GFR calc Af Amer: >60 mL/min (ref >60)
GFR calc Af Amer: >60 mL/min (ref >60)
GFR calc non Af Amer: >60 mL/min (ref >60)
GFR calc non Af Amer: >60 mL/min (ref >60)
Glucose, Bld: 185 mg/dL — ABNORMAL HIGH (ref 70–99)
Potassium: 3.2 mEq/L — ABNORMAL LOW (ref 3.5–5.1)
Potassium: 3.3 mEq/L — ABNORMAL LOW (ref 3.5–5.1)
Potassium: 3.4 mEq/L — ABNORMAL LOW (ref 3.5–5.1)
Potassium: 3.6 mEq/L (ref 3.5–5.1)
Sodium: 138 mEq/L (ref 135–145)
Sodium: 135 mEq/L (ref 135–145)
Sodium: 135 mEq/L (ref 135–145)
Sodium: 133 mEq/L — ABNORMAL LOW (ref 135–145)

## 2010-08-06 LAB — CARDIAC PANEL(CRET KIN+CKTOT+MB+TROPI)
CK, MB: 3.7 ng/mL (ref 0.3–4.0)
CK, MB: 3.8 ng/mL (ref 0.3–4.0)
Relative Index: 1.7 (ref 0.0–2.5)
Relative Index: 1.7 (ref 0.0–2.5)
Total CK: 218 U/L (ref 7–232)
Total CK: 222 U/L (ref 7–232)
Troponin I: <0.3 ng/mL (ref <0.30)
Troponin I: <0.3 ng/mL (ref <0.30)

## 2010-08-06 LAB — URINALYSIS, ROUTINE W REFLEX MICROSCOPIC
Ketones, ur: NEGATIVE mg/dL
Leukocytes, UA: NEGATIVE
Nitrite: NEGATIVE
pH: 6 (ref 5.0–8.0)

## 2010-08-06 LAB — GLUCOSE, CAPILLARY
Glucose-Capillary: 198 mg/dL — ABNORMAL HIGH (ref 70–99)
Glucose-Capillary: 278 mg/dL — ABNORMAL HIGH (ref 70–99)

## 2010-08-06 LAB — CBC
MCH: 30.9 pg (ref 26.0–34.0)
MCHC: 34.7 g/dL (ref 30.0–36.0)
Platelets: 215 10*3/uL (ref 150–400)
RBC: 4.47 MIL/uL (ref 4.22–5.81)

## 2010-08-06 LAB — DIFFERENTIAL
Basophils Absolute: 0 10*3/uL (ref 0.0–0.1)
Basophils Relative: 0 % (ref 0–1)
Eosinophils Absolute: 0.2 10*3/uL (ref 0.0–0.7)
Monocytes Relative: 8 % (ref 3–12)
Neutro Abs: 3.7 10*3/uL (ref 1.7–7.7)
Neutrophils Relative %: 53 % (ref 43–77)

## 2010-08-06 LAB — HEPATIC FUNCTION PANEL
ALT: 23 U/L (ref 0–53)
Albumin: 3.4 g/dL — ABNORMAL LOW (ref 3.5–5.2)
Indirect Bilirubin: 0.4 mg/dL (ref 0.3–0.9)
Total Protein: 5.8 g/dL — ABNORMAL LOW (ref 6.0–8.3)

## 2010-08-06 LAB — HEMOGLOBIN A1C: Hgb A1c MFr Bld: 13.9 % — ABNORMAL HIGH (ref <5.7)

## 2010-08-06 LAB — LIPID PANEL
Cholesterol: 131 mg/dL (ref 0–200)
LDL Cholesterol: 73 mg/dL (ref 0–99)

## 2010-08-07 LAB — BASIC METABOLIC PANEL
BUN: 8 mg/dL (ref 6–23)
Chloride: 101 mEq/L (ref 96–112)
GFR calc non Af Amer: >60 mL/min (ref >60)
Glucose, Bld: 252 mg/dL — ABNORMAL HIGH (ref 70–99)
Potassium: 3.3 mEq/L — ABNORMAL LOW (ref 3.5–5.1)

## 2010-08-07 LAB — GLUCOSE, CAPILLARY: Glucose-Capillary: 263 mg/dL — ABNORMAL HIGH (ref 70–99)
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
## 2010-08-08 LAB — URINE CULTURE
Colony Count: NO GROWTH
Culture: NO GROWTH

## 2010-08-09 ENCOUNTER — Telehealth: Payer: Self-pay

## 2010-08-09 NOTE — Telephone Encounter (Signed)
Pt called statin this CBG this am is 308. Pt was recently hospitalized (Monday) with blood sugar 700+. Pt's medications have been adjusted and his blood sugars have been coming down but slowly.Pt is concerned that the medications may not be working and is requesting further advisement form MD. Pt has appt Monday 05/14, please advise

## 2010-08-09 NOTE — Telephone Encounter (Signed)
Ok to increase the metformin to 1000 bid (2 x 500 mg twice per day)  rx done per emr  Will need ROV 6 wks (if does not already have OV scheduled)  With CPX labs , and hgba1c  250.02

## 2010-08-09 NOTE — Telephone Encounter (Signed)
Pt advised and has OV scheduled Monday 05/14

## 2010-08-09 NOTE — Telephone Encounter (Signed)
Pt says he takes Januvia 100 mg qam and Metformin 500 mg tid with meal. Pt CBG at time of call was 274.  He has not yet taken his medication or eaten

## 2010-08-09 NOTE — Telephone Encounter (Signed)
Pt last seen here dec 2011, and has been to ER  Please clarify his DM medications

## 2010-08-11 ENCOUNTER — Encounter: Payer: Self-pay | Admitting: Internal Medicine

## 2010-08-11 DIAGNOSIS — Z0000 Encounter for general adult medical examination without abnormal findings: Secondary | ICD-10-CM

## 2010-08-11 DIAGNOSIS — R7302 Impaired glucose tolerance (oral): Secondary | ICD-10-CM | POA: Insufficient documentation

## 2010-08-11 HISTORY — DX: Encounter for general adult medical examination without abnormal findings: Z00.00

## 2010-08-12 ENCOUNTER — Ambulatory Visit (INDEPENDENT_AMBULATORY_CARE_PROVIDER_SITE_OTHER): Payer: 59 | Admitting: Internal Medicine

## 2010-08-12 ENCOUNTER — Encounter: Payer: Self-pay | Admitting: Internal Medicine

## 2010-08-12 VITALS — BP 122/80 | HR 66 | Temp 98.8°F | Ht 73.0 in | Wt 201.2 lb

## 2010-08-12 DIAGNOSIS — Z0000 Encounter for general adult medical examination without abnormal findings: Secondary | ICD-10-CM

## 2010-08-12 DIAGNOSIS — K219 Gastro-esophageal reflux disease without esophagitis: Secondary | ICD-10-CM

## 2010-08-12 DIAGNOSIS — E119 Type 2 diabetes mellitus without complications: Secondary | ICD-10-CM

## 2010-08-12 HISTORY — DX: Gastro-esophageal reflux disease without esophagitis: K21.9

## 2010-08-12 NOTE — Assessment & Plan Note (Signed)
Improved on current meds, now on 4 per day metformin 500's and januvia 100;  Had DM educatioin in the hospital and declines further, to check sugars once daily (at different times of the day) and follow diet/excercise recommendations; encouraged med compliance as well; check f/u labs in 6 wks, as well as prior to next visit

## 2010-08-12 NOTE — Assessment & Plan Note (Addendum)

## 2010-08-12 NOTE — Progress Notes (Signed)
Subjective:    Patient ID: [name], male    DOB: Oct 25, 1960, 50 y.o.   MRN: [id number]  HPI  Here to f/u; overall doing ok after recent hospn for hyperosmolar elev glucose after stopping his meds,  Pt denies chest pain, increased sob or doe, wheezing, orthopnea, PND, increased LE swelling, palpitations, dizziness or syncope.  Pt denies new neurological symptoms such as new headache, or facial or extremity weakness or numbness   Pt denies polydipsia, polyuria, or low sugar symptoms such as weakness or confusion improved with po intake.  Pt states overall good compliance with meds, trying to follow lower cholesterol, diabetic diet, wt overall stable but little exercise however.  Tolerating new meds well. Most recent a1c over 13, prior to that 7.2 last nov 2011.  Needs ok to go back to work tomorrow Past Medical History  Diagnosis Date  . Hypertension   . Hyperlipidemia   . Diabetes mellitus     type II  . LVH (left ventricular hypertrophy)     with systolic dysfunction by echo 10/03, EF 45-50% - Dr. Elsie Lincoln  . OSA (obstructive sleep apnea)   . DIABETES MELLITUS, TYPE II 08/29/2009  . HYPERLIPIDEMIA 08/29/2009  . HYPERTENSION 01/14/2008  . SLEEP APNEA, OBSTRUCTIVE 01/14/2008  . GERD (gastroesophageal reflux disease) 08/12/2010   No past surgical history on file.  reports that he has been smoking.  He does not have any smokeless tobacco history on file. He reports that he drinks alcohol. He reports that he does not use illicit drugs. family history includes Hypertension in his mother. No Known Allergies Current Outpatient Prescriptions on File Prior to Visit  Medication Sig Dispense Refill  . amLODipine-benazepril (LOTREL) 10-20 MG per capsule Take 1 capsule by mouth daily.        . Nebivolol HCl (BYSTOLIC) 20 MG TABS Take by mouth daily.        . pravastatin (PRAVACHOL) 40 MG tablet Take 40 mg by mouth daily.        Marland Kitchen aspirin 81 MG EC tablet Take 81 mg by mouth daily.        . silver  sulfADIAZINE (SILVADENE) 1 % cream Apply topically daily.        Marland Kitchen DISCONTD: metFORMIN (GLUMETZA) 500 MG (MOD) 24 hr tablet Take 500 mg by mouth daily.         Review of Systems Review of Systems  Constitutional: Negative for diaphoresis, activity change, appetite change and unexpected weight change.  HENT: Negative for hearing loss, ear pain, facial swelling, mouth sores and neck stiffness.   Eyes: Negative for pain, redness and visual disturbance.  Respiratory: Negative for shortness of breath and wheezing.   Cardiovascular: Negative for chest pain and palpitations.  Gastrointestinal: Negative for diarrhea, blood in stool, abdominal distention and rectal pain.  Genitourinary: Negative for hematuria, flank pain and decreased urine volume.  Musculoskeletal: Negative for myalgias and joint swelling.  Skin: Negative for color change and wound.  Neurological: Negative for syncope and numbness.  Hematological: Negative for adenopathy.  Psychiatric/Behavioral: Negative for hallucinations, self-injury, decreased concentration and agitation.      Objective:   Physical Exam BP 122/80  Pulse 66  Temp(Src) 98.8 F (37.1 C) (Oral)  Ht 6\' 1"  (1.854 m)  Wt 201 lb 4 oz (91.286 kg)  BMI 26.55 kg/m2  SpO2 97% Physical Exam  VS noted Constitutional: Pt is oriented to person, place, and time. Appears well-developed and well-nourished.  HENT:  Head: Normocephalic and atraumatic.  Right  Ear: External ear normal.  Left Ear: External ear normal.  Nose: Nose normal.  Mouth/Throat: Oropharynx is clear and moist.  Eyes: Conjunctivae and EOM are normal. Pupils are equal, round, and reactive to light.  Neck: Normal range of motion. Neck supple. No JVD present. No tracheal deviation present.  Cardiovascular: Normal rate, regular rhythm, normal heart sounds and intact distal pulses.   Pulmonary/Chest: Effort normal and breath sounds normal.  Abdominal: Soft. Bowel sounds are normal. There is no  tenderness.  Musculoskeletal: Normal range of motion. Exhibits no edema.  Lymphadenopathy:  Has no cervical adenopathy.  Neurological: Pt is alert and oriented to person, place, and time. Pt has normal reflexes. No cranial nerve deficit.  Skin: Skin is warm and dry. No rash noted.  Psychiatric:  Has  normal mood and affect. Behavior is normal.        Assessment & Plan:

## 2010-08-12 NOTE — Patient Instructions (Addendum)
Continue all other medications as before Please check your sugars once per day, at different times of the day before meals Please return for LAB only in 6 weeks Please call the phone number [phone] (the [employer]Tree System) for results of testing in 2-3 days;  When calling, simply dial the number, and when prompted enter the MRN number above (the Medical Record Number) and the # key, then the message should start. Please return in 6 mo with Lab testing done 3-5 days before as well

## 2010-08-13 ENCOUNTER — Ambulatory Visit: Payer: Self-pay | Admitting: Internal Medicine

## 2010-08-13 NOTE — H&P (Signed)
NAMEREGINAL, WOJCICKI NO.:  [id number]   MEDICAL RECORD NO.:  [id number]          PATIENT TYPE:  EMS   LOCATION:  MAJO                         FACILITY:  [facility]   PHYSICIAN:  Nanetta Batty, M.D.   DATE OF BIRTH:  [birth year]   DATE OF ADMISSION:  10/20/2008  DATE OF DISCHARGE:                              HISTORY & PHYSICAL   CHIEF COMPLAINT:  Chest pain.   History of 1-2 weeks of intermittent chest pain, would come and go at  any time, not really with exertion at all.  He was more frustrated it  was left anterior chest pressure, though. Sometimes he would belch, and  it would feel better but described as a pressure. No associated symptoms  of nausea, shortness of breath or vomiting.  No dizziness or  lightheadedness. Would come on any time with rest or exertion the last  15 minutes or so and would not awaken him from sleep.  The patient does  continue to smoke 1 pack per day.   On the initial exam the patient did not want to stay, and Dr. Allyson Sabal was  notified to see the patient.   PAST MEDICAL HISTORY:  The patient does have hypertension and  dyslipidemia and has been on Prilosec in the past.  He underwent cardiac  catheterization in 2003 at [facility] due to abnormal  Cardiolite with suggestion of anterior wall ischemia. He had  hypertensive heart disease, patent coronary arteries but EF was down at  45%.  He also had normal renal arteries and normal abdominal aorta and  common iliacs.  He since that time had a stress test in March 2009 that  was negative for ischemia and EF was 53%. A 2-D echo at the same time,  EF was 45-50%.  There was mild global hypokinesis of the left ventricle.  LA was mildly dilated.  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 Mild MR and mild TR. Mild pulmonic valve  regurgitation.  The patient had been doing well until the last couple of  weeks.   FAMILY HISTORY:  Without change.   SOCIAL  HISTORY:  Single. His girlfriend and mother are with him here  today.  The patient does smoke 1 pack per day and continues to do so.   REVIEW OF SYSTEMS:  GENERAL:  No colds or fevers.  SKIN: No rashes.  HEENT: No blurred vision.  CARDIOVASCULAR:  As stated.  PULMONARY: No  shortness of breath. Positive tobacco use.  GI: No diarrhea,  constipation or melena.  GU: No hematuria or dysuria.  MUSCULOSKELETAL:  Negative.  NEURO:  No lightheadedness, dizziness, or syncope.   VITAL SIGNS:  Today blood pressure 146/89, pulse 78, respirations 20,  temperature 99, oxygen saturation 99%.  Blood pressure at times has been  as high as 141/104.   PHYSICAL EXAMINATION:  GENERAL:  Alert, oriented African-American male,  somewhat agitated currently, tired of waiting.  SKIN:  Warm and dry.  Brisk capillary refill.  HEENT: Normocephalic.  Sclerae are clear.  NECK: Supple.  No JVD, no bruits.  LUNGS:  Clear without rales, rhonchi or wheezes.  HEART:  S1 and S2. Regular rate and rhythm without obvious murmur,  gallop, rub, or click.  ABDOMEN: Soft, nontender, positive bowel sounds.  Do not palpate liver,  spleen, or masses.  EXTREMITIES:  Lower extremities without edema.  Pedal pulses are  present.  NEURO:  Alert and oriented x3.  Follows commands.   ASSESSMENT:  1. Chest pain, currently pain-free, rule out myocardial infarction.  2. Known decreased ejection fraction.  3. Hypertension.  4. Dyslipidemia.  5. Tobacco abuse.   PLAN:  Admit to telemetry bed.  Serial CK-MBs. Lovenox 1 mg/kg. Will  also add nitro paste, Xanax 0.5 mg q.12 h routinely and q.8 h p.r.n.  Oxygen 2 liters as tolerated.  And plans will be for cardiac  catheterization on Monday. Please note initially the patient refused to  stay in the hospital and was going to sign out AMA, but after discussion  with his mother and girlfriend, he has decided to stay in the hospital.  Currently he is scheduled for second case on Monday morning  with Dr.  Allyson Sabal.  The patient prefers to have it done as soon as possible on  Monday.      Darcella Gasman. Annie Paras, N.P.      Nanetta Batty, M.D.  Electronically Signed    LRI/MEDQ  D:  10/20/2008  T:  10/20/2008  Job:  [id number]   cc:   Nanetta Batty, M.D.  Corwin Levins, MD

## 2010-08-13 NOTE — Cardiovascular Report (Signed)
[name], [name] NO.:  [id number]   MEDICAL RECORD NO.:  [id number]          PATIENT TYPE:  INP   LOCATION:  2503                         FACILITY:  [facility]   PHYSICIAN:  Nanetta Batty, M.D.   DATE OF BIRTH:  1960/06/13   DATE OF PROCEDURE:  DATE OF DISCHARGE:  10/23/2008                            CARDIAC CATHETERIZATION   [name] is a 50 year old mildly overweight, married African  American male, positive risk factors, admitted on October 20, 2008, with  crescendo angina.  He ruled out by enzymes and was placed on IV heparin.  There were no EKG changes.  He presents now for diagnostic coronary  arteriography via the right radial approach.   DESCRIPTION OF PROCEDURE:  The patient was brought to the Second Floor  [facility] Cardiac Cath Lab in a postabsorptive state.  He was  premedicated with p.o. Valium.  His right radial and groin were prepped  and shaved in the usual sterile fashion.  Xylocaine 1% was used for  local anesthesia.  A 5-French Terumo sheath was inserted into the right  radial artery using standard Seldinger technique.  A 5-French Terumo  Tiger catheter and pigtail catheter were used for selective  cholangiography and left ventriculography respectively.  Visipaque dye  was used for the entirety case.  Retrograde aorta, left ventricular, and  pullback pressures were recorded.   HEMODYNAMICS:  1. Aortic systolic pressure 109, diastolic pressure 72.  2. Left ventricular systolic pressure 110 and end-diastolic pressure      7.   SELECTIVE CORONARY ANGIOGRAPHY:  1. Left main normal.  2. LAD normal.  3. Left circumflex normal.  4. Right coronary is dominant normal.  5. Left ventriculography; RAO left ventriculogram was performed using      25 mL of Visipaque dye at 12 mL per second.  The overall LVEF was      estimated a greater than 60% without focal wall motion abnormality.   IMPRESSION:  [name] has essentially normal coronaries  and normal  left ventricular function.  I believe his chest pain is noncardiac.  Empiric antireflux therapy will be recommended.  The patient did receive  2000 units of heparin intravenously as well as standard radial  cocktail for anti-spasm.  The arterial sheath was removed and a TR bit  band was applied at 12 mL of air achieving hemostasis.  The patient left  the Lab in stable condition.  He will be discharged home later today and  will be seen back in the office in followup.      Nanetta Batty, M.D.  Electronically Signed     JB/MEDQ  D:  10/23/2008  T:  10/24/2008  Job:  [id number]   cc:   [facility]

## 2010-08-13 NOTE — Discharge Summary (Signed)
[name], [name]           ACCOUNT NO.:  [id number]   MEDICAL RECORD NO.:  [id number]          PATIENT TYPE:  INP   LOCATION:  2503                         FACILITY:  [facility]   PHYSICIAN:  Nanetta Batty, M.D.   DATE OF BIRTH:  1961/01/24   DATE OF ADMISSION:  10/20/2008  DATE OF DISCHARGE:  10/23/2008                               DISCHARGE SUMMARY   DISCHARGE DIAGNOSES:  1. Chest pain most likely gastrointestinal.      a.     Cardiac catheterization with normal coronary arteries,       negative myocardial infarction, also normal left ventricular       function improved from previous cath in 2003.  2. Hypertension, controlled.  3. Obstructive sleep apnea with continuous positive airway pressure.  4. Tobacco use.  5. Anxiety/anger issues.   DISCHARGE CONDITION:  Improved.   PROCEDURES:  Combined left heart cath on October 23, 2008, by Dr. Nanetta Batty with normal course and normal LV function.   HOSPITAL COURSE:  The patient was admitted October 20, 2008, please see H  and P for complete details, secondary to 1-2 weeks of intermittent chest  pain.  It would come and go, not really with exertion.  He became  frustrated with his chest pressure.  Sometimes, he could belch and would  feel better, but other times it would not.  He had no associated  symptoms.  On the day of admission, he had increasing discomfort and was  instructed to come to the emergency room.  The patient does continue to  smoke one pack per day.  He had a cardiac cath in 2003 due to a positive  stress test.  He had patent coronary arteries, but his EF was 45%.  He  also had normal renals and normal abdominal aorta and common iliac  arteries.  Previously, he had a nuclear study in 2009 negative for  ischemia, EF was 53% and 2D echo, EF was 45-50 in 2009.   Initially, the patient was angry that having to wait in the ER, was  going to sign out AMA, but after discussion with his family, he decided  to stay for  the weekend for cardiac catheterization on Monday, which he  underwent and tolerated well.  It was a radial case.  He is now  postintervention and ready for discharge home.  He was instructed on  post-radial site catheterization sheet by his nurse.   DISCHARGE MEDICATIONS:  1. Lotrel 10/20 daily.  2. Bystolic 5 mg half a tab daily.  3. Aspirin 81 mg daily.  Continue CPAP as before and he was given a      prescription for Prilosec 20 mg daily.   DISCHARGE INSTRUCTIONS:  1. Increase activity slowly.  May shower, October 24, 2008.  No lifting      for 4 days.  No driving for 2 days.  No sexual activity for 2 days.  2. Wash cath site with soap and water.  Call if any bleeding,      swelling, or drainage.  3. Stop smoking.  4. Follow up with Dr. Allyson Sabal on  November 13, 2008, at 11:45 a.m.   The patient will follow up with Dr. Allyson Sabal as stated.      Darcella Gasman. Annie Paras, N.P.      Nanetta Batty, M.D.  Electronically Signed    LRI/MEDQ  D:  10/23/2008  T:  10/24/2008  Job:  [id number]   cc:   Corwin Levins, MD

## 2010-08-16 NOTE — Discharge Summary (Signed)
NAMEKAYRON, HICKLIN                       ACCOUNT NO.:  [id number]   MEDICAL RECORD NO.:  [id number]                   PATIENT TYPE:  INP   LOCATION:  0376                                 FACILITY:  [facility]   PHYSICIAN:  Deirdre Peer. Polite, M.D.              DATE OF BIRTH:  December 03, 1960   DATE OF ADMISSION:  01/06/2002  DATE OF DISCHARGE:  01/08/2002                                 DISCHARGE SUMMARY   DISCHARGE DIAGNOSES:  1. Hypertensive crisis with associated headache and shortness of breath, now     resolved, blood pressure controlled.  2. Left ventricular dysfunction per 2-D echocardiogram, ejection fraction 45     to 50%.  3. Acute bronchitis, improving.  4. Tobacco abuse.   DISCHARGE MEDICATIONS:  1. Lopressor 50 mg b.i.d.  2. Altace 2.5 mg q.d.  3. Azithromax 500 mg q.d. x2 days.   CONSULTATIONS:  None this hospitalization.   PROCEDURES AND STUDIES:  1. EKG on 01/06/02, with normal sinus rhythm, ST wave abnormality,     ventricular rate 86.  2. Chest x-ray on 01/06/02, with cardiomegaly, mild pulmonary vascular     congestion, question mild/incipient congestive heart failure.  3. CT scan of the head on 01/06/02, some atrophic changes involving the     frontal lobes, no acute intracranial abnormality.  4. A 2-D echocardiogram on 01/07/02, with decreased systolic function,     ejection fraction 45 to 50%, increased left ventricular wall thickness.     No wall motion abnormalities noted.   LABORATORY DATA:  White blood cell count 7.8, RBC 4.37, hemoglobin 13.7,  hematocrit 40.7, MCV 93.2.  Sodium 138, potassium 3.6, chloride 106, CO2 27,  glucose 124, BUN 11, creatinine 1.1.  Serial cardiac enzymes x3, CK 311,  281, 262, CK-MB 3.7, 3.1, 2.6, troponin 0.01, 0.01, 0.02.  Lipid panel with  a total cholesterol of 173, triglycerides 44, HDL 59, LDL 105.  TSH 0.289.  Cortisol level 33.4.  Urine drug screen was negative.  Urinalysis was  negative.  Glycosylated hemoglobin  5.6.   DISPOSITION:  The patient will be discharged home.   HISTORY OF PRESENT ILLNESS:  This is a 50 year old man with no previous past  medical history who presented to [facility] Emergency Room on 01/07/02,  with complaints of headache and elevated blood pressure for two days.  At  presentation, the patient did complain of some mild subjective shortness of  breath.  He denied any chest pain or palpitations, nor visual changes, no  history of drug use, no cough or sputum production.  His highest recorded  diastolic blood pressure in the emergency room was 126.  The patient did  report having used Sudafed recently for nasal congestion.  Initial chest x-  ray did reveal cardiomegaly with mild pulmonary edema.  He was admitted for  hypertensive crisis and to rule out myocardial infarction.   HOSPITAL COURSE:  #1 -  HYPERTENSIVE  CRISIS:  In the emergency room, the  patient was initially treated with IV Lopressor 5 mg x2, and  hydrochlorothiazide.  The patient's blood pressure did come down  significantly.  He was continued on Lopressor 50 mg b.i.d., along with  Altace 2.5 mg q.d.  His blood pressure responded nicely on this regimen.  Discharge blood pressure was 134/94.  Other causes of possible hypertensive  crisis were investigated.  TSH was normal at 0.289.  Cortisol normal at  33.4.  Urine drug screen was negative.  At discharge, his urine  catecholamine was pending.  An urinalysis was negative.  At this time, this  most likely represents an uncontrolled episode of essential hypertension.  We will continue the patient on a beta blocker and ACE inhibitor with  further evaluation and titration of his medications to be followed by his  primary care physician.  #2 -  HEADACHE:  Most likely associated with elevated blood pressure.  CT  scan of the head with results as noted above.  Complete resolution of his  headache was noted with control of his blood pressure.  #3 -  LEFT VENTRICULAR  DYSFUNCTION:  Again, initial chest x-ray revealed  questionable pulmonary edema/congestive heart failure.  A 2-D echocardiogram  was performed which did reveal left ventricular dysfunction with ejection  fraction of 45 to 50%, increased left ventricular wall thickness with no  wall motion abnormalities.  Ischemia was ruled out.  CK's were negative.  EKG was essentially normal with LV strain pattern.  Recommendations are for  an outpatient stress test.  #4 -  HYPERGLYCEMIA:  There was one episode of increased blood sugar on his  metabolic panel. A hemoglobin A1C was obtained with results as above.  #5 -  ACUTE BRONCHITIS:  The patient is a smoker with a history of one pack  per day for approximately 20 years.  Azithromax was started with good  results along with Combivent inhaler.  At discharge, his lung sounds are  clear.  He is afebrile.  Will continue Azithromax for two more days.  The  patient with no cough or sputum production.  #6 -  TOBACCO ABUSE:  The patient was strongly advised on tobacco cessation.   FOLLOWUP:  The patient was instructed to follow up with Dr. Dorothyann Peng,  and advised to get a followup stress test on an outpatient basis.      Stephanie Swaziland, NP                      Deirdre Peer. Polite, M.D.    SJ/MEDQ  D:  01/08/2002  T:  01/08/2002  Job:  [id number]   cc:   Candyce Churn. Allyne Gee, M.D.

## 2010-08-16 NOTE — H&P (Signed)
NAMEVENNIE, WAYMIRE                       ACCOUNT NO.:  [id number]   MEDICAL RECORD NO.:  [id number]                   PATIENT TYPE:  EMS   LOCATION:  ED                                   FACILITY:  [facility]   PHYSICIAN:  Jackie Plum, M.D.             DATE OF BIRTH:  01-23-1961   DATE OF ADMISSION:  01/06/2002  DATE OF DISCHARGE:                                HISTORY & PHYSICAL   PROBLEM LIST:  1. Reason for hospitalization:  Hypertensive emergency, resolving.     A. Highest recorded diastolic blood pressure at ED more than 120 mmHg        (126 mmHg) with complaints of headache, subjective mild breathing        difficulty with bilateral basilar rales and cardiomegaly and mild        pulmonary vascular congestion on chest x-ray, consistent with possible        mild pulmonary edema/heart failure.     B. BNP, 2-D echo, urine catecholamines ordered, results pending.  2. History of cigarette smoking one pack a day for more than 20 years.   CHIEF COMPLAINT:  I have a headache, and my blood pressure is up over the  last two days.   HISTORY OF PRESENT ILLNESS:  The patient is a 50 year old African-American  gentleman with history of no prior history of hypertension who presents with  headache associated with hypertension.  No evidence of chest pain,  palpitations, paroxysmal nocturnal dyspnea, orthopnea, cough, sputum  production but admitted to mild subjective breathing difficulty.  No history  of visual changes.  Denies dizziness or decreased urinary output.  No  history of illicit drug use.  Denies history of fever, chills, sore throat,  but admits to nasal congestion.  Denies abdominal pain, nausea, vomiting,  diaphoresis, diarrhea, black or bloody stools.   He has been having headaches over the last two days.  During a routine blood  drive he was found to be hypertensive with an unrecordable blood pressure by  the sphygmomanometer.  He did not seek medical attention.   However, with  worsening of his headaches he has had to come to [facility] Room  today for that reason.  According to the ED records they had recorded the  filling pressure was 128 mmHg.  He was given Lopressor 5 mg IV x 2 and  hydrochlorothiazide, and his blood pressure came down somewhat.   PAST MEDICAL HISTORY:  The patient denies any history of hypertension,  diabetes, heart disease, stroke previously.   ALLERGIES:  He is not allergic to any medicine.   MEDICATIONS:  He has been taking over-the-counter Sudafed for his nasal  congestion over the last few days.   FAMILY HISTORY:  He denies any family history of hypertension, diabetes,  heart disease, kidney disease, or stroke.   SOCIAL HISTORY:  The patient has never married.  He is a Naval architect.  Has  a 40 year old daughter.  He drinks alcohol occasionally.  Denies illicit  drug use.  He smokes one pack of cigarettes per day for more than 20 years.  He has two other siblings who are all healthy and well.   REVIEW OF SYSTEMS:  Significant positives as noted in the HPI, but otherwise  the review of systems was unremarkable.   PHYSICAL EXAMINATION:  VITAL SIGNS:  Blood pressure was 154/95 at the time  of my evaluation with a heart rate of 71, respiratory rate of 22 per minute,  O2 saturation of 98% on room air.  GENERAL:  Notable for a middle-aged gentleman lying on a stretcher.  He was  not in acute cardiopulmonary distress.  He was not pale-looking.  HEENT:  Normocephalic, atraumatic.  Pupils were equal, round, and reactive  to light.  Funduscopic exam was suboptimal on account of the patient's  inability to cooperate.  However, no hypertensive retinopathic changes were  appreciated.  He was not pale.  He was not icteric.  The oropharynx was  moist, without any exudate or erythema.  TMs were unremarkable.  The nares  were notable for flare, nasal discharge, without any erythema of the  turbinates.  NECK:  Supple.   No JVD was appreciated.  No thyromegaly was appreciated.  LUNGS:  He had vesicular breath sounds which were mildly decreased at the  bases with bilateral basilar rales.  No wheezes were appreciated.  CARDIAC:  PMI was palpable at the left intercostal space lateral to the  midclavicular line.  He had regular rate and rhythm, without any gallops or  murmurs.  ABDOMEN:  Abdomen was full.  Bowel sounds were present.  They were  normoactive.  No tenderness, no hepatosplenomegaly was appreciated.  EXTREMITIES:  He did not have any cyanosis or clubbing of the extremities.  There was no obvious pedal edema.  CNS:  Alert and oriented x 3.  There was no obvious focal deficit.   LABORATORY DATA:  WBC count 7.8, hemoglobin 13.7, hematocrit 40.7, MCV 12.2,  platelet count 230.  Sodium 139, potassium 3.8, chloride 108, CO2 26,  glucose 105, BUN 13, creatinine 1.0, calcium 8.9.  Total CPK 311, CK-MB 3.7,  troponin 0.01.  Urine:  Color, yellow.  Appearance, clear.  Specific gravity  of 1.017, pH 5.5, glucose negative, hemoglobin negative, bilirubin negative,  ketones negative, total protein negative, urobilinogen 1.0, urine nitrite  negative.  Leukocyte esterase was also negative.  Drug scan was negative for  opiates, cocaine, benzodiazepines, barbiturates, tetrahydrocannabinol.   Chest x-ray was notable for cardiomegaly with mild pulmonary vascular  congestion.   The 12-lead EKG was notable for sinus rhythm at 86 beats per minute.  LVH  and ST and T-wave changes consistent with strain pattern.   ASSESSMENT:  The patient is a 50 year old gentleman with no prior history of  hypertension, no family history of hypertension, who presents with headaches  and incidental finding of high blood pressure.  The patient had been taking  Afrin for his nasal congestion.  The patient does not have any significant cardiopulmonary complaints except for mild subjective breathing difficulty.  No visual changes, no  dizziness.  His urine output has been okay.  He denies  any illicit drug use and, indeed, his drug screen was negative.  He has  bilateral rales on exam.  He has cardiomegaly on x-ray with mild pulmonary  vascular congestion.   Assessment was diastolic blood pressure more than 120 mmHg on presentation  as  per ED records, associated with complaints of headaches and x-ray  findings.  I believe that this patient was in hypertensive emergency when he  came in.  His blood pressure at the time of my evaluation was as noted  above, and I think that his hypertensive emergency is resolving at this  moment.  He does not have any other significant cardiopulmonary  symptomatology.  Will admit the patient to a telemetry bed at this time.  Will complete an MI rule-out protocol.  Will obtain urine for  catecholamines.  Obtain a 2-D echo to evaluate his cardiac function.  Will  also obtain a TSH and BNP.  On account of his normal potassium, we are not  going to do any workup for hyperaldosteronism.   _______________ has a side effect of commonly causing increased blood  pressure.  I could not comfortably attribute the patient's condition to this  medication, especially with his x-ray findings.  Will therefore go ahead and  complete the above workup as noted above.  I believe that the patient will  be ready for discharge after getting all his baseline workup and be followed  as an outpatient.                                               Jackie Plum, M.D.    GO/MEDQ  D:  01/06/2002  T:  01/07/2002  Job:  [id number]   cc:   Deirdre Peer. Polite, M.D.  [address]  [place], [place] [address]  Fax: [phone]

## 2010-08-20 NOTE — Discharge Summary (Signed)
[name], [name]           ACCOUNT NO.:  [id number]  MEDICAL RECORD NO.:  [id number]           PATIENT TYPE:  I  LOCATION:  1427                         FACILITY:  [facility]  PHYSICIAN:  Erick Blinks, MD     DATE OF BIRTH:  1960/05/17  DATE OF ADMISSION:  08/05/2010 DATE OF DISCHARGE:                              DISCHARGE SUMMARY   PRIMARY CARE PHYSICIAN:  Corwin Levins, M.D. from [facility]  DISCHARGE DIAGNOSES: 1. Hyperosmolar hyperglycemic state, resolved. 2. Uncontrolled type 2 diabetes, new diagnosis. 3. Dehydration, resolved. 4. Hypertension, stable. 5. Hyperlipidemia. 6. Gastroesophageal reflux disease. 7. Obstructive sleep apnea on CPAP. 8. Hypokalemia.  DISCHARGE MEDICATIONS: 1. Metformin 500 mg p.o. t.i.d. with meals. 2. Hydrocortisone 2.5% cream per rectal b.i.d. for hemorrhoids. 3. Omeprazole 20 mg 1 tablet p.o. daily. 4. Bystolic 20 mg 1 tablet p.o. q.p.m. 5. Pravachol 40 mg 1 tablet p.o. daily. 6. Amlodipine and benazepril 10/20 mg 1 tablet p.o. q.p.m. 7. Colace 100 mg p.o. b.i.d. 8. Januvia 100 mg p.o. daily.  ADMISSION HISTORY:  This is a 50 year old African American gentleman with history of prediabetes in the past, who had been taking metformin, presents to the emergency room with complaints of dizziness, polydipsia, polyuria, blurry vision and generalized weakness for 2 weeks prior to admission.  The patient reported a 20-40 pound weight loss in the past 2 weeks.  In the ER, he was noted to have CBG greater than 600, and the patient was subsequently admitted for further evaluation.  For further details, please refer the history and physical dictated by Dr. Janee Morn on May 7.  HOSPITAL COURSE: 1. Hyperosmolar state.  The patient was placed on an insulin drip.  He     was aggressively hydrated with IV fluids.  His blood sugars have     since improved with these measures.  His A1c was checked and was     found be 13.9.  He was  initially started on long-acting insulin but     on further discussion with the patient he reports that due to his     occupation, he is unable to take injectables.  He works as a Biomedical engineer and reports if he is on injectable medicine, then he will     likely lose his job and therefore he does not wish to take any     insulin as an outpatient.  The patient was started on metformin.     His blood sugars have been ranging in the 200s.  He will also be     started on Januvia.  The case was discussed with his primary care     physician who will follow him up as an outpatient.  He has an     appointment on Monday to see him.  He will also be given a     prescription for a glucometer as well as testing supplies and was     recommended to check his sugars at least twice a day.  He will also     be set up with outpatient diabetes education. 2. The remainder of the  patient's medical problems have been stable     here in the hospital.  CONSULTATIONS:  None.  PROCEDURES:  None.  DIAGNOSTIC IMAGING:  Chest x-ray from May 7 shows no active disease.  DISCHARGE INSTRUCTIONS:  The patient to continue on heart-healthy, low- calorie diet.  Conduct his activity as tolerated.  He is a followup appointment with his primary care physician on Monday and has been advised to keep this appointment.  He should check his sugars at least twice a day for now.  Plan was discussed with the patient who was in agreement.  CONDITION AT TIME OF DISCHARGE:  Improved.     Erick Blinks, MD     JM/MEDQ  D:  08/07/2010  T:  08/07/2010  Job:  [id number]  cc:   Corwin Levins, MD [address] [place] [address]  Electronically Signed by Erick Blinks  on 08/20/2010 01:31:04 PM

## 2010-08-30 ENCOUNTER — Other Ambulatory Visit: Payer: Self-pay | Admitting: Internal Medicine

## 2010-08-30 ENCOUNTER — Other Ambulatory Visit: Payer: Self-pay | Admitting: *Deleted

## 2010-08-30 MED ORDER — METFORMIN HCL 500 MG PO TABS: 1000.0000 mg | ORAL_TABLET | Freq: Two times a day (BID) | ORAL | Status: DC

## 2010-09-04 ENCOUNTER — Other Ambulatory Visit: Payer: Self-pay

## 2010-09-04 MED ORDER — SITAGLIPTIN PHOSPHATE 100 MG PO TABS: 100.0000 mg | ORAL_TABLET | Freq: Every day | ORAL | Status: DC

## 2010-09-09 ENCOUNTER — Other Ambulatory Visit: Payer: Self-pay

## 2010-09-09 MED ORDER — AMLODIPINE BESY-BENAZEPRIL HCL 10-20 MG PO CAPS: 1.0000 | ORAL_CAPSULE | Freq: Every day | ORAL | Status: DC

## 2010-09-16 ENCOUNTER — Other Ambulatory Visit (INDEPENDENT_AMBULATORY_CARE_PROVIDER_SITE_OTHER): Payer: 59

## 2010-09-16 DIAGNOSIS — E119 Type 2 diabetes mellitus without complications: Secondary | ICD-10-CM

## 2010-09-16 DIAGNOSIS — Z0000 Encounter for general adult medical examination without abnormal findings: Secondary | ICD-10-CM

## 2010-09-16 LAB — BASIC METABOLIC PANEL
Calcium: 9.2 mg/dL (ref 8.4–10.5)
Creatinine, Ser: 0.9 mg/dL (ref 0.4–1.5)
GFR: 120.41 mL/min (ref >60.00)
Glucose, Bld: 114 mg/dL — ABNORMAL HIGH (ref 70–99)
Sodium: 144 mEq/L (ref 135–145)

## 2010-09-16 LAB — HEMOGLOBIN A1C: Hgb A1c MFr Bld: 9.8 % — ABNORMAL HIGH (ref 4.6–6.5)

## 2010-09-17 ENCOUNTER — Other Ambulatory Visit: Payer: Self-pay | Admitting: Internal Medicine

## 2010-09-17 LAB — PSA: PSA: 0.56 ng/mL (ref 0.10–4.00)

## 2010-09-17 MED ORDER — PIOGLITAZONE HCL 45 MG PO TABS: 45.0000 mg | ORAL_TABLET | Freq: Every day | ORAL | Status: DC

## 2010-09-23 ENCOUNTER — Telehealth: Payer: Self-pay

## 2010-09-23 NOTE — Telephone Encounter (Signed)
Pt had appt scheduled fot tomorrow with Dr Jonny Ruiz

## 2010-09-23 NOTE — Telephone Encounter (Signed)
[facility] Triage Call Report Triage Record Num: [id number] Operator: Alphonsa Overall Patient Name: [facility] Call Date & Time: 09/21/2010 9:33:12PM Patient Phone: [phone] PCP: Oliver Barre Patient Gender: Male PCP Fax : [phone] Patient DOB: 11-24-1960 Practice Name: [facility] Reason for Call: Mr Markson calling about anal symptoms. Onset 1 month. Cream not helping. Afebrile. No bleeding 09/21/10. Pain persisting and severe, pt unable to sit down. Mr Mizrahi aware needs ED evaluation. Protocol(s) Used: Rectal Symptoms Recommended Outcome per Protocol: See ED Immediately Reason for Outcome: Severe, unrelenting rectal pain Care Advice: ~ Another adult should drive. ~ Do not give the patient anything to eat or drink. ~ IMMEDIATE ACTION Write down provider's name. List or place the following in a bag for transport with the patient: current prescription and/or nonprescription medications; alternative treatments, therapies and medications; and street drugs. ~ 09/21/2010 9:43:36PM Page 1 of 1 [email or web address]

## 2010-09-24 ENCOUNTER — Ambulatory Visit (INDEPENDENT_AMBULATORY_CARE_PROVIDER_SITE_OTHER): Payer: 59 | Admitting: Internal Medicine

## 2010-09-24 ENCOUNTER — Encounter: Payer: Self-pay | Admitting: Internal Medicine

## 2010-09-24 DIAGNOSIS — E119 Type 2 diabetes mellitus without complications: Secondary | ICD-10-CM

## 2010-09-24 DIAGNOSIS — K6289 Other specified diseases of anus and rectum: Secondary | ICD-10-CM | POA: Insufficient documentation

## 2010-09-24 DIAGNOSIS — I10 Essential (primary) hypertension: Secondary | ICD-10-CM

## 2010-09-24 MED ORDER — GLIMEPIRIDE 4 MG PO TABS: 4.0000 mg | ORAL_TABLET | Freq: Two times a day (BID) | ORAL | Status: DC

## 2010-09-24 MED ORDER — HYDROCORTISONE ACETATE 25 MG RE SUPP: 25.0000 mg | Freq: Two times a day (BID) | RECTAL | Status: DC

## 2010-09-24 NOTE — Progress Notes (Signed)
Subjective:    Patient ID: [name], male    DOB: March 12, 1961, 50 y.o.   MRN: [id number]  HPI  Here with 3-4 days onset rectal discomfort with trace bleeding, worse in the first 2 days, somewhat better now and can sit for more than a few minutes, no hx of hemorrhoid, last colonscopy 2 yrs.   Pt denies fever, wt loss, night sweats, loss of appetite, or other constitutional symptoms  Pt denies chest pain, increased sob or doe, wheezing, orthopnea, PND, increased LE swelling, palpitations, dizziness or syncope.  Pt denies new neurological symptoms such as new headache, or facial or extremity weakness or numbness   Pt denies polydipsia, polyuria. Past Medical History  Diagnosis Date  . Hypertension   . Hyperlipidemia   . Diabetes mellitus     type II  . LVH (left ventricular hypertrophy)     with systolic dysfunction by echo 10/03, EF 45-50% - Dr. Elsie Lincoln  . OSA (obstructive sleep apnea)   . DIABETES MELLITUS, TYPE II 08/29/2009  . HYPERLIPIDEMIA 08/29/2009  . HYPERTENSION 01/14/2008  . SLEEP APNEA, OBSTRUCTIVE 01/14/2008  . GERD (gastroesophageal reflux disease) 08/12/2010   No past surgical history on file.  reports that he has been smoking.  He does not have any smokeless tobacco history on file. He reports that he drinks alcohol. He reports that he does not use illicit drugs. family history includes Hypertension in his mother. No Known Allergies Current Outpatient Prescriptions on File Prior to Visit  Medication Sig Dispense Refill  . amLODipine-benazepril (LOTREL) 10-20 MG per capsule Take 1 capsule by mouth daily.  90 capsule  3  . aspirin 81 MG EC tablet Take 81 mg by mouth daily.        Tery Sanfilippo Calcium ([employer] STOOL SOFTENER PO) Take by mouth 2 (two) times daily.        . Lancets (ONETOUCH ULTRASOFT) lancets TEST AS DIRECTED  100 each  11  . metFORMIN (GLUCOPHAGE) 500 MG tablet Take 2 tablets (1,000 mg total) by mouth 2 (two) times daily.  120 tablet  6  . Nebivolol HCl  (BYSTOLIC) 20 MG TABS Take by mouth daily.        Marland Kitchen omeprazole (PRILOSEC) 20 MG capsule Take 20 mg by mouth daily.        . ONE TOUCH TEST STRIPS test strip TEST EVERY MORNING BEFORE EATING AND BEFORE LUNCH AND DINNER  100 each  11  . pioglitazone (ACTOS) 45 MG tablet Take 1 tablet (45 mg total) by mouth daily.  30 tablet  11  . pravastatin (PRAVACHOL) 40 MG tablet Take 40 mg by mouth daily.        . silver sulfADIAZINE (SILVADENE) 1 % cream Apply topically daily.        . sitaGLIPtan (JANUVIA) 100 MG tablet Take 1 tablet (100 mg total) by mouth daily.  90 tablet  1   Review of Systems Review of Systems  Constitutional: Negative for diaphoresis and unexpected weight change.  HENT: Negative for drooling and tinnitus.   Eyes: Negative for photophobia and visual disturbance.  Respiratory: Negative for choking and stridor.    Objective:   Physical Exam BP 136/84  Pulse 70  Temp(Src) 98.1 F (36.7 C) (Oral)  Ht 6\' 1"  (1.854 m)  Wt 206 lb (93.441 kg)  BMI 27.18 kg/m2  SpO2 97% Physical Exam  VS noted Constitutional: Pt appears well-developed and well-nourished.  HENT: Head: Normocephalic.  Right Ear: External ear normal.  Left Ear:  External ear normal.  Eyes: Conjunctivae and EOM are normal. Pupils are equal, round, and reactive to light.  Neck: Normal range of motion. Neck supple.  Cardiovascular: Normal rate and regular rhythm.   Pulmonary/Chest: Effort normal and breath sounds normal.  Abd:  Soft, NT, non-distended, + BS Neurological: Pt is alert. No cranial nerve deficit.  DRE:  Normal tone, heme neg, fissure noted, no hemorrhoid, no masses       Assessment & Plan:

## 2010-09-24 NOTE — Assessment & Plan Note (Signed)
Pt uncomfortable with continuing the actos due to ads on TV about lawsuit,  Will change to glimeparide 4 bid, Continue all other medications as before, f/u next visit

## 2010-09-24 NOTE — Assessment & Plan Note (Signed)
With anal fissure -for anusol HC asd,  to f/u any worsening symptoms or concerns., consider gen surgury if not improved or recurrent

## 2010-09-24 NOTE — Assessment & Plan Note (Signed)
stable overall by hx and exam, most recent data reviewed with pt, and pt to continue medical treatment as before  BP Readings from Last 3 Encounters:  09/24/10 136/84  08/12/10 122/80  02/06/10 140/92

## 2010-09-24 NOTE — Patient Instructions (Signed)
Take all new medications as prescribed Continue all other medications as before, except stop the actos when you are finished

## 2010-10-07 ENCOUNTER — Other Ambulatory Visit: Payer: Self-pay | Admitting: Internal Medicine

## 2010-10-17 ENCOUNTER — Telehealth: Payer: Self-pay | Admitting: *Deleted

## 2010-10-17 NOTE — Telephone Encounter (Signed)
Pt started Amaryl 4mg  Rx today; BSL dropped to 50. Pt having No: Confusion, Dizziness, Feeling shaky, Hunger, Headaches, Irritability, Palpitations, racing pulse, Pale skin, Sweating, Trembling, Weakness  Pt informed to d/c medication, eat frequent small meals, monitor BSL Informed Pt will call back w/MD instructions first thing tomorrow morning; any above symptoms develop patient will go to ED to be assessed/evaluated.

## 2010-10-17 NOTE — Telephone Encounter (Signed)
Ok to take HALF amaryl 4 mg =  2 mg for now  Cont to monitor cbgs at least twice daily  Call Monday with cbg results

## 2010-10-18 NOTE — Telephone Encounter (Signed)
Per VO Dr Jonny Ruiz, Obtained CBGs from Pt for this morning [110 fast, 163 PC] Ok to follow previously given orders this morning & informed that MD feels anxiety could be causing heart racing vs medication. Pt will monitor CBGs, split medication dosage in half, and call on Monday w/numbers.

## 2010-10-18 NOTE — Telephone Encounter (Signed)
Patient now states that he had heart palpations last evening and were worse last night throughout the night. Do we still continue w/previous order?

## 2010-10-21 ENCOUNTER — Telehealth: Payer: Self-pay

## 2010-10-21 NOTE — Telephone Encounter (Signed)
Pt's girlfriend called on pt's behalf stating he is still experiencing palpitations, sweating and weakness with Amaryl, please advise.

## 2010-10-21 NOTE — Telephone Encounter (Signed)
If the sugar is ok at the time (such as more than 90) he should be ok to continue the medication  Please check sugars bid for 2-3 days and call with results

## 2010-10-21 NOTE — Telephone Encounter (Signed)
Ok to just stop amaryl at this point , check cbg's and call with results one wk;  Options would include lower dose amaryl (like 1 mg once or twice per day) or insulin

## 2010-10-21 NOTE — Telephone Encounter (Signed)
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 Pt says his blood sugars was as low as 50 last Thursday, 80 yesterday morning and 110 this morning. Pt does not want to continue Amaryl and is requesting an alternative, please advise.

## 2010-10-22 NOTE — Telephone Encounter (Signed)
Pt advised in detailed and understood.

## 2010-12-13 ENCOUNTER — Inpatient Hospital Stay (HOSPITAL_COMMUNITY)
Admission: AD | Admit: 2010-12-13 | Discharge: 2010-12-16 | DRG: 728 | Disposition: A | Discharge status: Home / Self Care | Payer: 59 | Source: Ambulatory Visit | Attending: Urology | Admitting: Urology

## 2010-12-13 DIAGNOSIS — I10 Essential (primary) hypertension: Secondary | ICD-10-CM | POA: Diagnosis present

## 2010-12-13 DIAGNOSIS — F17200 Nicotine dependence, unspecified, uncomplicated: Secondary | ICD-10-CM | POA: Diagnosis present

## 2010-12-13 DIAGNOSIS — G4730 Sleep apnea, unspecified: Secondary | ICD-10-CM | POA: Diagnosis present

## 2010-12-13 DIAGNOSIS — N452 Orchitis: Principal | ICD-10-CM | POA: Diagnosis present

## 2010-12-13 DIAGNOSIS — K219 Gastro-esophageal reflux disease without esophagitis: Secondary | ICD-10-CM | POA: Diagnosis present

## 2010-12-13 LAB — CBC
Hemoglobin: 13.8 g/dL (ref 13.0–17.0)
MCH: 32.2 pg (ref 26.0–34.0)
MCHC: 34.8 g/dL (ref 30.0–36.0)
Platelets: 213 10*3/uL (ref 150–400)
RBC: 4.29 MIL/uL (ref 4.22–5.81)

## 2010-12-13 LAB — BASIC METABOLIC PANEL
Calcium: 9.3 mg/dL (ref 8.4–10.5)
GFR calc non Af Amer: >60 mL/min (ref >60)
Glucose, Bld: 172 mg/dL — ABNORMAL HIGH (ref 70–99)
Potassium: 3 mEq/L — ABNORMAL LOW (ref 3.5–5.1)
Sodium: 138 mEq/L (ref 135–145)

## 2010-12-14 LAB — GLUCOSE, CAPILLARY: Glucose-Capillary: 178 mg/dL — ABNORMAL HIGH (ref 70–99)

## 2010-12-14 LAB — GENTAMICIN LEVEL, RANDOM: Gentamicin Rm: 1 ug/mL

## 2010-12-14 NOTE — H&P (Signed)
[name], FRITH NO.:  [id number]  MEDICAL RECORD NO.:  [id number]  LOCATION:  1413                         FACILITY:  [facility]  PHYSICIAN:  Natalia Leatherwood, MD    DATE OF BIRTH:  [birth year]  DATE OF ADMISSION:  12/13/2010 DATE OF DISCHARGE:                             HISTORY & PHYSICAL   ADMISSION DIAGNOSIS:  Acute right epididymitis.  CHIEF COMPLAINT:  Epididymitis.  HISTORY OF PRESENT ILLNESS:  This is a 50 year old gentleman who has a history of epididymitis.  He had acute onset of sharp pain less than 24 hours ago in his right scrotum.  It radiates to his right back.  It is not associated with gross hematuria or dysuria.  It is not associated with fever, chills, nausea, or emesis.  He had never had pain this excruciating before though he has had similar pains.  PAST MEDICAL HISTORY: 1. Sleep apnea. 2. Esophageal reflux. 3. Heart disease. 4. Heartburn. 5. Hypertension.  PAST SURGICAL HISTORY:  History of oral surgery.  MEDICATIONS: 1. Bystolic 20 mg daily. 2. Januvia 100 mg daily. 3. Lotrel 10/20 mg p.o. daily. 4. Metformin 500 mg daily. 5. Omeprazole 20 mg daily. 6. Pravastatin 20 mg daily.  ALLERGIES:  No known drug allergies.  FAMILY HISTORY:  Positive for heart disease in father.  SOCIAL HISTORY:  One to two drinks alcohol per day.  Smokes one-pack of cigarettes per day.  REVIEW OF SYSTEMS:  Positive for abdominal pain, fever, fatigue, and back pain.  Negative for vomiting, night sweats, and joint pain. Further 10-point review of systems is negative.  PHYSICAL EXAMINATION:  VITAL SIGNS:  Blood pressure 151/87, temperature 99.6, heart rate 71. GENERAL:  No acute distress.  Alert and oriented. ENT:  Ears and nose are normal in appearance.  Oropharynx is normal. NECK:  The appearance of the neck is normal.  No neck masses present. PULMONARY:  No respiratory distress.  Normal respiratory rhythm. CARDIOVASCULAR:  Heart is regular.   Rhythm is regular.  Rate is regular. No peripheral edema. ABDOMEN:  Soft.  No tenderness to palpation. PENIS:  No swelling.  No tenderness.  No balanitis. SCROTUM:  No edema.  No erythema.  No lesions. EPIDIDYMIS:  Left epididymis is slightly tender to palpation.  No mass. Right epididymis has approximately 1 cm enlargement in the head and the tail that are tender to palpation.  No fluctuance. LYMPHATICS:  Posterior cervical lymph node is negative for lymphadenopathy.  Supraclavicular lymph node is negative for lymphadenopathy. SKIN:  Normal skin turgor.  No visible rash. NEUROLOGIC:  Mood and affect are appropriate.  No focal sensory deficits.  RADIOLOGICAL DATA:  The patient had a scrotal ultrasound done in my office today showing no epididymal or scrotal abscess, but it did show epididymitis  because there is a large amount of blood flow to the right epididymis.  ASSESSMENT:  Acute epididymitis.  PLAN: 1. Admit to the hospital to prevent development of an abscess.     He needs IV     antibiotics. 2. He will also need good and adequate pain control. 3. I have ordered a urine culture to be done as soon as he gets to the  floor and then antibiotics to start immediately after he is given a     sample.  He will be started vancomycin and gentamicin.  We will     follow him in the hospital.          ______________________________ Natalia Leatherwood, MD     DW/MEDQ  D:  12/13/2010  T:  12/13/2010  Job:  [id number]  Electronically Signed by Natalia Leatherwood MD on 12/14/2010 05:33:34 PM

## 2010-12-15 LAB — COMPREHENSIVE METABOLIC PANEL
ALT: 11 U/L (ref 0–53)
AST: 13 U/L (ref 0–37)
Albumin: 3.5 g/dL (ref 3.5–5.2)
Alkaline Phosphatase: 60 U/L (ref 39–117)
Calcium: 8.7 mg/dL (ref 8.4–10.5)
Potassium: 3.3 mEq/L — ABNORMAL LOW (ref 3.5–5.1)
Sodium: 137 mEq/L (ref 135–145)
Total Protein: 6.8 g/dL (ref 6.0–8.3)

## 2010-12-15 LAB — GLUCOSE, CAPILLARY
Glucose-Capillary: 145 mg/dL — ABNORMAL HIGH (ref 70–99)
Glucose-Capillary: 139 mg/dL — ABNORMAL HIGH (ref 70–99)

## 2010-12-15 LAB — URINE CULTURE
Colony Count: NO GROWTH
Culture  Setup Time: 201209150321
Culture: NO GROWTH

## 2010-12-15 LAB — CBC
HCT: 42 % (ref 39.0–52.0)
Hemoglobin: 14.2 g/dL (ref 13.0–17.0)
MCH: 31.5 pg (ref 26.0–34.0)
MCV: 93.1 fL (ref 78.0–100.0)
RBC: 4.51 MIL/uL (ref 4.22–5.81)
WBC: 8 10*3/uL (ref 4.0–10.5)

## 2010-12-15 LAB — VANCOMYCIN, TROUGH: Vancomycin Tr: 8.1 ug/mL — ABNORMAL LOW (ref 10.0–20.0)

## 2010-12-16 LAB — GLUCOSE, CAPILLARY: Glucose-Capillary: 107 mg/dL — ABNORMAL HIGH (ref 70–99)

## 2010-12-23 LAB — CBC
MCV: 93.5
Platelets: 253
WBC: 7.1

## 2010-12-23 LAB — POCT CARDIAC MARKERS
Myoglobin, poc: 79.3
Operator id: 234501
Operator id: 234501
Troponin i, poc: <0.05

## 2010-12-23 LAB — I-STAT 8, (EC8 V) (CONVERTED LAB)
Bicarbonate: 24.3 — ABNORMAL HIGH
Glucose, Bld: 113 — ABNORMAL HIGH
Sodium: 139
TCO2: 26
pH, Ven: 7.331 — ABNORMAL HIGH

## 2010-12-23 LAB — DIFFERENTIAL
Basophils Relative: 0
Eosinophils Absolute: 0.1
Lymphs Abs: 1.3
Neutro Abs: 4.9
Neutrophils Relative %: 69

## 2010-12-23 LAB — APTT: aPTT: 26

## 2010-12-23 LAB — PROTIME-INR
INR: 1
Prothrombin Time: 12.9

## 2010-12-27 NOTE — Discharge Summary (Signed)
  [name], [name]           ACCOUNT NO.:  [id number]  MEDICAL RECORD NO.:  [id number]  LOCATION:  1413                         FACILITY:  [facility]  PHYSICIAN:  Danae Chen, M.D.  DATE OF BIRTH:  December 10, 1960  DATE OF ADMISSION:  12/13/2010 DATE OF DISCHARGE:  12/16/2010                              DISCHARGE SUMMARY   DISCHARGE DIAGNOSES: 1. Right epididymitis. 2. Hypertension. 3. Diabetes. 4. Hypercholesterolemia.  The patient is a 50 year old male who was admitted with history of acute onset of sharp and right testicular pain.  The pain radiated to his back.  It was not associated with chills, fever, nausea, or vomiting. Scrotal ultrasound showed enlarged tail of the right epididymis without any fluctuation.  The patient was admitted with the diagnosis of right epididymitis and was started on IV gentamicin and vancomycin.  PAST MEDICAL HISTORY: 1. Positive for heart disease. 2. Hypertension. 3. Hypercholesterolemia. 4. Diabetes.  PHYSICAL EXAMINATION:  VITAL SIGNS:  His blood pressure was 151/87, pulse 71, and temperature 99.6. CHEST:  Lungs were clear. HEART:  Regular rhythm. ABDOMEN:  Soft and nondistended, nontender.  No CVA tenderness.  Kidneys are not palpable. GU:  The right epididymis was exquisitely tender and the tail of the epididymis was also enlarged.  The patient was treated with IV vancomycin and gentamicin.  He responded well to the treatment.  The pain and swelling of the scrotum gradually decreased.  He tolerated his diet well.  Urine culture was negative. His white count went down to 8000 from 14.5.  On September 17, he was afebrile, and he was discharged home on all his home medications and on Bactrim DS 1 tablet twice a day and hydrocodone/APAP 5/325, 1 or 2 tablets every 4 hours as needed for pain.  He is instructed to wear his scrotal supporter.  He will be followed in the office as an outpatient.    Danae Chen, M.D.    MN/MEDQ  D:   12/16/2010  T:  12/16/2010  Job:  [id number]  Electronically Signed by Lindaann Slough M.D. on 12/27/2010 10:16:36 AM

## 2011-02-07 ENCOUNTER — Other Ambulatory Visit (INDEPENDENT_AMBULATORY_CARE_PROVIDER_SITE_OTHER): Payer: 59

## 2011-02-07 DIAGNOSIS — E119 Type 2 diabetes mellitus without complications: Secondary | ICD-10-CM

## 2011-02-07 LAB — LIPID PANEL
HDL: 60 mg/dL (ref >39.00)
LDL Cholesterol: 69 mg/dL (ref 0–99)
Total CHOL/HDL Ratio: 2
VLDL: 10.4 mg/dL (ref 0.0–40.0)

## 2011-02-07 LAB — BASIC METABOLIC PANEL
Calcium: 9.2 mg/dL (ref 8.4–10.5)
GFR: 127.02 mL/min (ref >60.00)
Sodium: 143 mEq/L (ref 135–145)

## 2011-02-12 ENCOUNTER — Encounter: Payer: Self-pay | Admitting: Internal Medicine

## 2011-02-12 ENCOUNTER — Ambulatory Visit (INDEPENDENT_AMBULATORY_CARE_PROVIDER_SITE_OTHER): Payer: 59 | Admitting: Internal Medicine

## 2011-02-12 DIAGNOSIS — I10 Essential (primary) hypertension: Secondary | ICD-10-CM

## 2011-02-12 DIAGNOSIS — Z0000 Encounter for general adult medical examination without abnormal findings: Secondary | ICD-10-CM

## 2011-02-12 DIAGNOSIS — E119 Type 2 diabetes mellitus without complications: Secondary | ICD-10-CM

## 2011-02-12 DIAGNOSIS — E785 Hyperlipidemia, unspecified: Secondary | ICD-10-CM

## 2011-02-12 MED ORDER — AMLODIPINE BESY-BENAZEPRIL HCL 10-40 MG PO CAPS: 1.0000 | ORAL_CAPSULE | Freq: Every day | ORAL | Status: DC

## 2011-02-12 MED ORDER — PRAVASTATIN SODIUM 40 MG PO TABS: 40.0000 mg | ORAL_TABLET | Freq: Every day | ORAL | Status: DC

## 2011-02-12 NOTE — Patient Instructions (Addendum)
Please change the generic for Lotrel from the 10-20 mg, to the 10-40 mg (1 per day) Continue all other medications as before (the pravachol was also sent to the pharmacy) Please return in 6 mo with Lab testing done 3-5 days before The labs will be forwarded to Dr Berry/cardiology

## 2011-02-16 ENCOUNTER — Encounter: Payer: Self-pay | Admitting: Internal Medicine

## 2011-02-16 NOTE — Assessment & Plan Note (Signed)
stable overall by hx and exam, most recent data reviewed with pt, and pt to continue medical treatment as before  Lab Results  Component Value Date   HGBA1C 6.0 02/07/2011

## 2011-02-16 NOTE — Assessment & Plan Note (Signed)
stable overall by hx and exam, most recent data reviewed with pt, and pt to continue medical treatment as before  Lab Results  Component Value Date   LDLCALC 69 02/07/2011

## 2011-02-16 NOTE — Progress Notes (Signed)
Subjective:    Patient ID: [name], male    DOB: [birth year], 50 y.o.   MRN: [id number]  HPI  Here to f/u; overall doing ok,  Pt denies chest pain, increased sob or doe, wheezing, orthopnea, PND, increased LE swelling, palpitations, dizziness or syncope.  Pt denies new neurological symptoms such as new headache, or facial or extremity weakness or numbness   Pt denies polydipsia, polyuria, or low sugar symptoms such as weakness or confusion improved with po intake.  Pt states overall good compliance with meds, trying to follow lower cholesterol, diabetic diet, wt overall stable but little exercise however.  No other acute complaints Past Medical History  Diagnosis Date  . Hypertension   . Hyperlipidemia   . Diabetes mellitus     type II  . LVH (left ventricular hypertrophy)     with systolic dysfunction by echo 10/03, EF 45-50% - Dr. Elsie Lincoln  . OSA (obstructive sleep apnea)   . DIABETES MELLITUS, TYPE II 08/29/2009  . HYPERLIPIDEMIA 08/29/2009  . HYPERTENSION 01/14/2008  . SLEEP APNEA, OBSTRUCTIVE 01/14/2008  . GERD (gastroesophageal reflux disease) 08/12/2010   No past surgical history on file.  reports that he has been smoking.  He does not have any smokeless tobacco history on file. He reports that he drinks alcohol. He reports that he does not use illicit drugs. family history includes Hypertension in his mother. No Known Allergies Current Outpatient Prescriptions on File Prior to Visit  Medication Sig Dispense Refill  . aspirin 81 MG EC tablet Take 81 mg by mouth daily.        Tery Sanfilippo Calcium ([employer] STOOL SOFTENER PO) Take by mouth 2 (two) times daily.        . Lancets (ONETOUCH ULTRASOFT) lancets TEST AS DIRECTED  100 each  11  . metFORMIN (GLUCOPHAGE) 500 MG tablet Take 2 tablets (1,000 mg total) by mouth 2 (two) times daily.  120 tablet  6  . Nebivolol HCl (BYSTOLIC) 20 MG TABS Take by mouth daily.        Marland Kitchen omeprazole (PRILOSEC) 20 MG capsule Take 20 mg by mouth daily.         . ONE TOUCH TEST STRIPS test strip TEST EVERY MORNING BEFORE EATING AND BEFORE LUNCH AND DINNER  100 each  11  . sitaGLIPtan (JANUVIA) 100 MG tablet Take 1 tablet (100 mg total) by mouth daily.  90 tablet  1   Review of Systems Review of Systems  Constitutional: Negative for diaphoresis and unexpected weight change.  HENT: Negative for drooling and tinnitus.   Eyes: Negative for photophobia and visual disturbance.  Respiratory: Negative for choking and stridor.   Gastrointestinal: Negative for vomiting and blood in stool.  Genitourinary: Negative for hematuria and decreased urine volume.  Musculoskeletal: Negative for gait problem.     Objective:   Physical Exam BP 142/90  Pulse 71  Temp(Src) 99 F (37.2 C) (Oral)  Ht 6\' 1"  (1.854 m)  Wt 205 lb 8 oz (93.214 kg)  BMI 27.11 kg/m2  SpO2 97% Physical Exam  VS noted Constitutional: Pt appears well-developed and well-nourished.  HENT: Head: Normocephalic.  Right Ear: External ear normal.  Left Ear: External ear normal.  Eyes: Conjunctivae and EOM are normal. Pupils are equal, round, and reactive to light.  Neck: Normal range of motion. Neck supple.  Cardiovascular: Normal rate and regular rhythm.   Pulmonary/Chest: Effort normal and breath sounds normal.  Abd:  Soft, NT, non-distended, + BS Neurological: Pt is alert.  No cranial nerve deficit.  Skin: Skin is warm. No erythema.  Psychiatric: Pt behavior is normal. Thought content normal.     Assessment & Plan:

## 2011-02-16 NOTE — Assessment & Plan Note (Addendum)
stable overall by hx and exam but BP elev, most recent data reviewed with pt, and pt to continue medical treatment as before except change the lotrol to the 10-40 mg, f/u next visit  BP Readings from Last 3 Encounters:  02/12/11 142/90  09/24/10 136/84  08/12/10 122/80

## 2011-03-03 ENCOUNTER — Other Ambulatory Visit: Payer: Self-pay | Admitting: Internal Medicine

## 2011-04-02 ENCOUNTER — Other Ambulatory Visit: Payer: Self-pay

## 2011-04-02 MED ORDER — METFORMIN HCL 500 MG PO TABS: 1000.0000 mg | ORAL_TABLET | Freq: Two times a day (BID) | ORAL | Status: DC

## 2011-06-11 ENCOUNTER — Other Ambulatory Visit (INDEPENDENT_AMBULATORY_CARE_PROVIDER_SITE_OTHER): Payer: 59

## 2011-06-11 DIAGNOSIS — E119 Type 2 diabetes mellitus without complications: Secondary | ICD-10-CM

## 2011-06-11 LAB — MICROALBUMIN / CREATININE URINE RATIO: Creatinine,U: 210.2 mg/dL

## 2011-06-12 ENCOUNTER — Encounter: Payer: Self-pay | Admitting: Internal Medicine

## 2011-06-12 ENCOUNTER — Ambulatory Visit (INDEPENDENT_AMBULATORY_CARE_PROVIDER_SITE_OTHER): Payer: 59 | Admitting: Internal Medicine

## 2011-06-12 DIAGNOSIS — K649 Unspecified hemorrhoids: Secondary | ICD-10-CM | POA: Insufficient documentation

## 2011-06-12 DIAGNOSIS — E785 Hyperlipidemia, unspecified: Secondary | ICD-10-CM

## 2011-06-12 DIAGNOSIS — E119 Type 2 diabetes mellitus without complications: Secondary | ICD-10-CM

## 2011-06-12 DIAGNOSIS — I10 Essential (primary) hypertension: Secondary | ICD-10-CM

## 2011-06-12 MED ORDER — LIDOCAINE-HYDROCORTISONE ACE 3-0.5 % RE CREA: 1.0000 | TOPICAL_CREAM | Freq: Two times a day (BID) | RECTAL | Status: AC

## 2011-06-12 NOTE — Assessment & Plan Note (Signed)
stable overall by hx and exam, most recent data reviewed with pt, and pt to continue medical treatment as before  BP Readings from Last 3 Encounters:  06/12/11 140/80  02/12/11 142/90  09/24/10 136/84

## 2011-06-12 NOTE — Patient Instructions (Addendum)
Take all new medications as prescribed Continue all other medications as before You will be contacted regarding the referral for: general surgury 

## 2011-06-12 NOTE — Progress Notes (Signed)
Subjective:    Patient ID: [name], male    DOB: 04/21/1960, 51 y.o.   MRN: [id number]  HPI  Here with acute complaint of rectal discomfort, some improved with topical rx cream, but discomfort soon returns, has been intermittent for 4-5 months, now worse in the past week.  Had noticed blood in years past, and last colonoscopy 2011, not due for f/u until next yr, no blood with current symptoms.  No fever, drainage, blood, chills, n/v, abd pain, or other change in stool, though often has hard stools and takes stool softeners.  Has what sounds like int hemorrhoids protruding with BM, and has to push back in.   No prior hx of known rectal fissure or hemorrhoid tx,  Pt denies chest pain, increased sob or doe, wheezing, orthopnea, PND, increased LE swelling, palpitations, dizziness or syncope.   Pt denies polydipsia, polyuria, no low sugars, wt stable, Overall good compliance with treatment, and good medicine tolerability. Past Medical History  Diagnosis Date  . Hypertension   . Hyperlipidemia   . Diabetes mellitus     type II  . LVH (left ventricular hypertrophy)     with systolic dysfunction by echo 10/03, EF 45-50% - Dr. Elsie Lincoln  . OSA (obstructive sleep apnea)   . DIABETES MELLITUS, TYPE II 08/29/2009  . HYPERLIPIDEMIA 08/29/2009  . HYPERTENSION 01/14/2008  . SLEEP APNEA, OBSTRUCTIVE 01/14/2008  . GERD (gastroesophageal reflux disease) 08/12/2010   No past surgical history on file.  reports that he has been smoking.  He does not have any smokeless tobacco history on file. He reports that he drinks alcohol. He reports that he does not use illicit drugs. family history includes Hypertension in his mother. No Known Allergies Current Outpatient Prescriptions on File Prior to Visit  Medication Sig Dispense Refill  . amLODipine-benazepril (LOTREL) 10-40 MG per capsule Take 1 capsule by mouth daily.  90 capsule  3  . aspirin 81 MG EC tablet Take 81 mg by mouth daily.        Marland Kitchen BYSTOLIC 20 MG  TABS TAKE 1 TABLET EVERY DAY  90 tablet  3  . Docusate Calcium ([employer] STOOL SOFTENER PO) Take by mouth 2 (two) times daily.        Marland Kitchen JANUVIA 100 MG tablet TAKE 1 TABLET (100 MG TOTAL) BY MOUTH DAILY.  90 tablet  3  . Lancets (ONETOUCH ULTRASOFT) lancets TEST AS DIRECTED  100 each  11  . metFORMIN (GLUCOPHAGE) 500 MG tablet Take 2 tablets (1,000 mg total) by mouth 2 (two) times daily.  120 tablet  11  . omeprazole (PRILOSEC) 20 MG capsule Take 20 mg by mouth daily.        . ONE TOUCH TEST STRIPS test strip TEST EVERY MORNING BEFORE EATING AND BEFORE LUNCH AND DINNER  100 each  11  . pravastatin (PRAVACHOL) 40 MG tablet Take 1 tablet (40 mg total) by mouth daily.  90 tablet  3   Review of Systems Review of Systems  Constitutional: Negative for diaphoresis and unexpected weight change.  HENT: Negative for drooling and tinnitus.   Eyes: Negative for photophobia and visual disturbance.  Respiratory: Negative for choking and stridor.   Gastrointestinal: Negative for vomiting and blood in stool.  Genitourinary: Negative for hematuria and decreased urine volume.    Objective:   Physical Exam BP 140/80  Pulse 71  Temp(Src) 98.5 F (36.9 C) (Oral)  Ht 6' 1.2" (1.859 m)  Wt 200 lb 4 oz (90.833 kg)  BMI 26.28 kg/m2  SpO2 96% Physical Exam  VS noted Constitutional: Pt appears well-developed and well-nourished.  HENT: Head: Normocephalic.  Right Ear: External ear normal.  Left Ear: External ear normal.  Eyes: Conjunctivae and EOM are normal. Pupils are equal, round, and reactive to light.  Neck: Normal range of motion. Neck supple.  Cardiovascular: Normal rate and regular rhythm.   Pulmonary/Chest: Effort normal and breath sounds normal.  Abd:  Soft, NT, non-distended, + BS Neurological: Pt is alert. No cranial nerve deficit.  Skin: Skin is warm. No erythema.  Psychiatric: Pt behavior is normal. Thought content normal.  Rectal:  Small ext hemorrhoid tender noted 11oclock, no bleeding, no  rectal mass, heme neg, prostate normal size, no mass    Assessment & Plan:

## 2011-06-12 NOTE — Assessment & Plan Note (Signed)
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

## 2011-06-12 NOTE — Assessment & Plan Note (Signed)
stable overall by hx and exam, most recent data reviewed with pt, and pt to continue medical treatment as before  Lab Results  Component Value Date   HGBA1C 6.0 06/11/2011

## 2011-06-12 NOTE — Assessment & Plan Note (Signed)
stable overall by hx and exam, most recent data reviewed with pt, and pt to continue medical treatment as before  Lab Results  Component Value Date   LDLCALC 69 02/07/2011    

## 2011-06-19 ENCOUNTER — Telehealth (INDEPENDENT_AMBULATORY_CARE_PROVIDER_SITE_OTHER): Payer: Self-pay | Admitting: General Surgery

## 2011-06-19 ENCOUNTER — Encounter (INDEPENDENT_AMBULATORY_CARE_PROVIDER_SITE_OTHER): Payer: Self-pay | Admitting: General Surgery

## 2011-06-19 ENCOUNTER — Ambulatory Visit (INDEPENDENT_AMBULATORY_CARE_PROVIDER_SITE_OTHER): Payer: 59 | Admitting: General Surgery

## 2011-06-19 VITALS — BP 138/84 | HR 70 | Temp 97.0°F | Resp 16 | Ht 73.0 in | Wt 201.4 lb

## 2011-06-19 DIAGNOSIS — K602 Anal fissure, unspecified: Secondary | ICD-10-CM

## 2011-06-19 NOTE — Progress Notes (Signed)
Chief complaint: Rectal pain  History: Patient is a 51 year old male referred by Dr. Jonny Ruiz for persistent rectal pain. This has been an intermittent problem for him for almost a year but in recent weeks to months has become more of a constant issue. He describes significant burning pain after bowel movements. It then we'll gradually get better until his next bowel movement but tends to bother and throughout the day. He has had occasional small amount of bleeding. He feels there is swelling and tissue at his anus after bowel movements. He had a colonoscopy in 2011. He is using a stool softener in his stools are now soft and easily pass. Past Medical History  Diagnosis Date  . Hypertension   . Hyperlipidemia   . Diabetes mellitus     type II  . LVH (left ventricular hypertrophy)     with systolic dysfunction by echo 10/03, EF 45-50% - Dr. Elsie Lincoln  . OSA (obstructive sleep apnea)   . DIABETES MELLITUS, TYPE II 08/29/2009  . HYPERLIPIDEMIA 08/29/2009  . HYPERTENSION 01/14/2008  . SLEEP APNEA, OBSTRUCTIVE 01/14/2008  . GERD (gastroesophageal reflux disease) 08/12/2010   No past surgical history on file. Current Outpatient Prescriptions  Medication Sig Dispense Refill  . amLODipine-benazepril (LOTREL) 10-40 MG per capsule Take 1 capsule by mouth daily.  90 capsule  3  . aspirin 81 MG EC tablet Take 81 mg by mouth daily.        Marland Kitchen BYSTOLIC 20 MG TABS TAKE 1 TABLET EVERY DAY  90 tablet  3  . Docusate Calcium ([employer] STOOL SOFTENER PO) Take by mouth 2 (two) times daily.        Marland Kitchen JANUVIA 100 MG tablet TAKE 1 TABLET (100 MG TOTAL) BY MOUTH DAILY.  90 tablet  3  . Lancets (ONETOUCH ULTRASOFT) lancets TEST AS DIRECTED  100 each  11  . lidocaine-hydrocortisone (ANAMANTEL HC) 3-0.5 % CREA Place 1 Applicatorful rectally 2 (two) times daily.  28.35 g  0  . metFORMIN (GLUCOPHAGE) 500 MG tablet Take 2 tablets (1,000 mg total) by mouth 2 (two) times daily.  120 tablet  11  . omeprazole (PRILOSEC) 20 MG capsule Take 20  mg by mouth daily.        . ONE TOUCH TEST STRIPS test strip TEST EVERY MORNING BEFORE EATING AND BEFORE LUNCH AND DINNER  100 each  11  . pravastatin (PRAVACHOL) 40 MG tablet Take 1 tablet (40 mg total) by mouth daily.  90 tablet  3   No Known Allergies History  Substance Use Topics  . Smoking status: Current Everyday Smoker -- 1.0 packs/day    Types: Cigarettes  . Smokeless tobacco: Not on file  . Alcohol Use: No   Exam:  Gen.: Well-developed African American male in no distress Rectal: Exam is limited rectal exam. There is a chronic appearing very tender fissure in the posterior midline. No other external abnormalities.  Assessment and plan: Chronic anal fissure. We discussed medical and surgical treatment options. Were initially going to try diltiazem cream. We discussed that his fissure appears chronic and this may not be effective. He may require anal sphincterotomy. He was given literature regarding the diagnosis and surgery. He will return in 4 weeks for followup.

## 2011-06-19 NOTE — Telephone Encounter (Signed)
Per Dr. Edwyna Ready, pt to return in 1 month, please call to schedule.

## 2011-06-26 NOTE — Telephone Encounter (Signed)
Called patient to give 1 month follow up appointment information, no answer or voice mail available.

## 2011-07-10 ENCOUNTER — Ambulatory Visit (INDEPENDENT_AMBULATORY_CARE_PROVIDER_SITE_OTHER): Payer: Self-pay | Admitting: General Surgery

## 2011-07-25 ENCOUNTER — Encounter (INDEPENDENT_AMBULATORY_CARE_PROVIDER_SITE_OTHER): Payer: Self-pay | Admitting: General Surgery

## 2011-07-25 ENCOUNTER — Ambulatory Visit (INDEPENDENT_AMBULATORY_CARE_PROVIDER_SITE_OTHER): Payer: 59 | Admitting: General Surgery

## 2011-07-25 VITALS — BP 150/99 | HR 83 | Temp 97.3°F | Ht 73.5 in | Wt 197.0 lb

## 2011-07-25 DIAGNOSIS — K602 Anal fissure, unspecified: Secondary | ICD-10-CM

## 2011-07-25 NOTE — Progress Notes (Signed)
Chief complaint: Followup anal fissure  History: Patient returns for followup of his chronic posterior anal fissure. He reports the diltiazem cream has helped his symptoms but he still has fairly frequent pain.  Pertinent exam: Rectal exam today again shows a tender chronic posterior midline fissure.  Assessment and plan: Persistent anal fissure despite medical management. I recommended proceeding with internal anal sphincterotomy under general anesthesia as an outpatient. We discussed the indications for the procedure and risks of bleeding, infection, nonhealing of the fissure, and slight risk of degrees of incontinence. His questions were answered and he is in agreement and we'll get this scheduled for him.

## 2011-08-20 DIAGNOSIS — K602 Anal fissure, unspecified: Secondary | ICD-10-CM

## 2011-08-20 HISTORY — PX: ANAL SPHINCTEROTOMY: SHX1140

## 2011-08-21 ENCOUNTER — Telehealth (INDEPENDENT_AMBULATORY_CARE_PROVIDER_SITE_OTHER): Payer: Self-pay | Admitting: General Surgery

## 2011-08-21 NOTE — Telephone Encounter (Signed)
Pt had surgery and is having trouble controlling pain adequately.  He does not get relief with just one pain pill, but gets itchy with two.  Suggested he could take ibuprofen or naproxyn in addition to help with pain control.  He should also do the warm tub soaks.  Questions answered and emotional support offered.  [name] understands the instructions.

## 2011-08-22 ENCOUNTER — Telehealth (INDEPENDENT_AMBULATORY_CARE_PROVIDER_SITE_OTHER): Payer: Self-pay | Admitting: General Surgery

## 2011-08-22 NOTE — Telephone Encounter (Signed)
Patient called with complaints of anal pain and bleeding after sphincterotomy.  He is changing his Depends every 2 hours. I recommended that he come into ER for evaluation and possible packing.

## 2011-08-23 ENCOUNTER — Encounter (HOSPITAL_COMMUNITY): Admission: EM | Disposition: A | Discharge status: Home / Self Care | Payer: Self-pay | Source: Home / Self Care

## 2011-08-23 ENCOUNTER — Encounter (HOSPITAL_COMMUNITY): Payer: Self-pay | Admitting: Anesthesiology

## 2011-08-23 ENCOUNTER — Observation Stay (HOSPITAL_COMMUNITY): Payer: 59 | Admitting: Anesthesiology

## 2011-08-23 ENCOUNTER — Encounter (HOSPITAL_COMMUNITY): Payer: Self-pay | Admitting: *Deleted

## 2011-08-23 ENCOUNTER — Inpatient Hospital Stay (HOSPITAL_COMMUNITY)
Admission: EM | Admit: 2011-08-23 | Discharge: 2011-08-25 | DRG: 349 | Disposition: A | Discharge status: Home / Self Care | Payer: 59 | Attending: Surgery | Admitting: Surgery

## 2011-08-23 DIAGNOSIS — K648 Other hemorrhoids: Secondary | ICD-10-CM

## 2011-08-23 DIAGNOSIS — K623 Rectal prolapse: Secondary | ICD-10-CM

## 2011-08-23 DIAGNOSIS — E119 Type 2 diabetes mellitus without complications: Secondary | ICD-10-CM | POA: Diagnosis present

## 2011-08-23 DIAGNOSIS — K644 Residual hemorrhoidal skin tags: Secondary | ICD-10-CM

## 2011-08-23 DIAGNOSIS — G4733 Obstructive sleep apnea (adult) (pediatric): Secondary | ICD-10-CM | POA: Diagnosis present

## 2011-08-23 LAB — GLUCOSE, CAPILLARY
Glucose-Capillary: 136 mg/dL — ABNORMAL HIGH (ref 70–99)
Glucose-Capillary: 150 mg/dL — ABNORMAL HIGH (ref 70–99)

## 2011-08-23 LAB — COMPREHENSIVE METABOLIC PANEL
AST: 11 U/L (ref 0–37)
BUN: 10 mg/dL (ref 6–23)
CO2: 26 mEq/L (ref 19–32)
Calcium: 8.9 mg/dL (ref 8.4–10.5)
Chloride: 101 mEq/L (ref 96–112)
Creatinine, Ser: 0.83 mg/dL (ref 0.50–1.35)
GFR calc Af Amer: >90 mL/min (ref >90)
GFR calc non Af Amer: >90 mL/min (ref >90)
Glucose, Bld: 124 mg/dL — ABNORMAL HIGH (ref 70–99)
Total Bilirubin: 0.2 mg/dL — ABNORMAL LOW (ref 0.3–1.2)

## 2011-08-23 LAB — DIFFERENTIAL
Basophils Relative: 0 % (ref 0–1)
Eosinophils Absolute: 0.2 10*3/uL (ref 0.0–0.7)
Eosinophils Relative: 2 % (ref 0–5)
Lymphs Abs: 2.8 10*3/uL (ref 0.7–4.0)
Monocytes Absolute: 0.8 10*3/uL (ref 0.1–1.0)
Monocytes Relative: 9 % (ref 3–12)
Neutrophils Relative %: 58 % (ref 43–77)

## 2011-08-23 LAB — CBC
HCT: 41.9 % (ref 39.0–52.0)
Hemoglobin: 14.4 g/dL (ref 13.0–17.0)
MCH: 32.2 pg (ref 26.0–34.0)
MCHC: 34.4 g/dL (ref 30.0–36.0)
MCV: 93.7 fL (ref 78.0–100.0)
RBC: 4.47 MIL/uL (ref 4.22–5.81)

## 2011-08-23 LAB — SAMPLE TO BLOOD BANK

## 2011-08-23 SURGERY — EXAM UNDER ANESTHESIA WITH HEMORRHOIDECTOMY
Anesthesia: General | Site: Rectum | Wound class: Clean Contaminated

## 2011-08-23 MED ORDER — PANTOPRAZOLE SODIUM 40 MG PO TBEC
40.0000 mg | DELAYED_RELEASE_TABLET | Freq: Every day | ORAL | Status: DC
  Administered 2011-08-24: 40 mg via ORAL
  Filled 2011-08-23 (×2): qty 1

## 2011-08-23 MED ORDER — 0.9 % SODIUM CHLORIDE (POUR BTL) OPTIME
TOPICAL | Status: DC | PRN
  Administered 2011-08-23: 50 mL

## 2011-08-23 MED ORDER — ONDANSETRON HCL 4 MG/2ML IJ SOLN: 4.0000 mg | Freq: Four times a day (QID) | INTRAMUSCULAR | Status: DC | PRN

## 2011-08-23 MED ORDER — KETOROLAC TROMETHAMINE 30 MG/ML IJ SOLN
INTRAMUSCULAR | Status: AC
  Filled 2011-08-23: qty 1

## 2011-08-23 MED ORDER — HYDROMORPHONE HCL PF 1 MG/ML IJ SOLN: 1.0000 mg | INTRAMUSCULAR | Status: DC | PRN

## 2011-08-23 MED ORDER — HYDROMORPHONE HCL PF 1 MG/ML IJ SOLN
1.0000 mg | Freq: Once | INTRAMUSCULAR | Status: AC
  Administered 2011-08-23: 1 mg via INTRAVENOUS
  Filled 2011-08-23: qty 1

## 2011-08-23 MED ORDER — ONDANSETRON HCL 4 MG/2ML IJ SOLN: 4.0000 mg | Freq: Once | INTRAMUSCULAR | Status: DC | PRN

## 2011-08-23 MED ORDER — OXYCODONE-ACETAMINOPHEN 5-325 MG PO TABS
1.0000 | ORAL_TABLET | ORAL | Status: DC | PRN
  Administered 2011-08-24 – 2011-08-25 (×4): 2 via ORAL
  Filled 2011-08-23 (×4): qty 2

## 2011-08-23 MED ORDER — ATORVASTATIN CALCIUM 10 MG PO TABS
10.0000 mg | ORAL_TABLET | Freq: Every day | ORAL | Status: DC
  Administered 2011-08-23 – 2011-08-24 (×2): 10 mg via ORAL
  Filled 2011-08-23 (×3): qty 1

## 2011-08-23 MED ORDER — HYDROMORPHONE HCL PF 1 MG/ML IJ SOLN
0.2500 mg | INTRAMUSCULAR | Status: DC | PRN
  Administered 2011-08-23 (×2): 0.5 mg via INTRAVENOUS

## 2011-08-23 MED ORDER — DOCUSATE SODIUM 100 MG PO CAPS
100.0000 mg | ORAL_CAPSULE | Freq: Two times a day (BID) | ORAL | Status: DC
  Administered 2011-08-23 – 2011-08-25 (×3): 100 mg via ORAL
  Filled 2011-08-23 (×5): qty 1

## 2011-08-23 MED ORDER — PIPERACILLIN-TAZOBACTAM 3.375 G IVPB 30 MIN
3.3750 g | Freq: Three times a day (TID) | INTRAVENOUS | Status: DC
  Administered 2011-08-23 – 2011-08-25 (×5): 3.375 g via INTRAVENOUS
  Filled 2011-08-23 (×7): qty 50

## 2011-08-23 MED ORDER — ASPIRIN EC 81 MG PO TBEC
81.0000 mg | DELAYED_RELEASE_TABLET | Freq: Every day | ORAL | Status: DC
  Filled 2011-08-23: qty 1

## 2011-08-23 MED ORDER — METFORMIN HCL 500 MG PO TABS
1000.0000 mg | ORAL_TABLET | Freq: Two times a day (BID) | ORAL | Status: DC
  Administered 2011-08-23 – 2011-08-25 (×4): 1000 mg via ORAL
  Filled 2011-08-23 (×7): qty 2

## 2011-08-23 MED ORDER — NEBIVOLOL HCL 20 MG PO TABS: 20.0000 mg | ORAL_TABLET | Freq: Every day | ORAL | Status: DC

## 2011-08-23 MED ORDER — INSULIN ASPART 100 UNIT/ML ~~LOC~~ SOLN
4.0000 [IU] | Freq: Three times a day (TID) | SUBCUTANEOUS | Status: DC
  Administered 2011-08-23 – 2011-08-25 (×6): 4 [IU] via SUBCUTANEOUS

## 2011-08-23 MED ORDER — HEMOSTATIC AGENTS (NO CHARGE) OPTIME
TOPICAL | Status: DC | PRN
  Administered 2011-08-23: 1

## 2011-08-23 MED ORDER — POTASSIUM CHLORIDE IN NACL 20-0.45 MEQ/L-% IV SOLN
INTRAVENOUS | Status: DC
  Administered 2011-08-23 – 2011-08-25 (×5): via INTRAVENOUS
  Filled 2011-08-23 (×6): qty 1000

## 2011-08-23 MED ORDER — BUPIVACAINE LIPOSOME 1.3 % IJ SUSP
INTRAMUSCULAR | Status: DC | PRN
  Administered 2011-08-23: 20 mL

## 2011-08-23 MED ORDER — FENTANYL CITRATE 0.05 MG/ML IJ SOLN
INTRAMUSCULAR | Status: DC | PRN
  Administered 2011-08-23 (×2): 50 ug via INTRAVENOUS

## 2011-08-23 MED ORDER — BUPIVACAINE LIPOSOME 1.3 % IJ SUSP
20.0000 mL | Freq: Once | INTRAMUSCULAR | Status: DC
  Filled 2011-08-23: qty 20

## 2011-08-23 MED ORDER — KETOROLAC TROMETHAMINE 15 MG/ML IJ SOLN
15.0000 mg | Freq: Four times a day (QID) | INTRAMUSCULAR | Status: DC
  Administered 2011-08-23 – 2011-08-25 (×6): 15 mg via INTRAVENOUS
  Filled 2011-08-23 (×13): qty 1

## 2011-08-23 MED ORDER — DOCUSATE SODIUM 100 MG PO CAPS
200.0000 mg | ORAL_CAPSULE | Freq: Two times a day (BID) | ORAL | Status: DC
  Filled 2011-08-23 (×2): qty 2

## 2011-08-23 MED ORDER — DIBUCAINE 1 % RE OINT
TOPICAL_OINTMENT | RECTAL | Status: DC | PRN
  Administered 2011-08-23: 1 via RECTAL

## 2011-08-23 MED ORDER — IBUPROFEN 400 MG PO TABS
400.0000 mg | ORAL_TABLET | Freq: Four times a day (QID) | ORAL | Status: DC | PRN
  Filled 2011-08-23: qty 1

## 2011-08-23 MED ORDER — LACTATED RINGERS IV SOLN
INTRAVENOUS | Status: DC | PRN
  Administered 2011-08-23: 11:00:00 via INTRAVENOUS

## 2011-08-23 MED ORDER — INSULIN ASPART 100 UNIT/ML ~~LOC~~ SOLN
0.0000 [IU] | Freq: Three times a day (TID) | SUBCUTANEOUS | Status: DC
  Administered 2011-08-23: 2 [IU] via SUBCUTANEOUS

## 2011-08-23 MED ORDER — HYDROMORPHONE HCL PF 1 MG/ML IJ SOLN
1.0000 mg | INTRAMUSCULAR | Status: DC | PRN
  Administered 2011-08-23: 1 mg via INTRAVENOUS
  Filled 2011-08-23: qty 1

## 2011-08-23 MED ORDER — AMLODIPINE BESYLATE 10 MG PO TABS
10.0000 mg | ORAL_TABLET | Freq: Every day | ORAL | Status: DC
  Administered 2011-08-24 – 2011-08-25 (×2): 10 mg via ORAL
  Filled 2011-08-23 (×3): qty 1

## 2011-08-23 MED ORDER — ACETAMINOPHEN 325 MG PO TABS
650.0000 mg | ORAL_TABLET | ORAL | Status: DC | PRN
  Administered 2011-08-23: 650 mg via ORAL
  Filled 2011-08-23: qty 2

## 2011-08-23 MED ORDER — ONDANSETRON HCL 4 MG PO TABS: 4.0000 mg | ORAL_TABLET | Freq: Four times a day (QID) | ORAL | Status: DC | PRN

## 2011-08-23 MED ORDER — MORPHINE SULFATE 4 MG/ML IJ SOLN
4.0000 mg | Freq: Once | INTRAMUSCULAR | Status: AC
  Administered 2011-08-23: 4 mg via INTRAVENOUS
  Filled 2011-08-23: qty 1

## 2011-08-23 MED ORDER — SODIUM CHLORIDE 0.9 % IV SOLN: INTRAVENOUS | Status: DC

## 2011-08-23 MED ORDER — MIDAZOLAM HCL 5 MG/5ML IJ SOLN
INTRAMUSCULAR | Status: DC | PRN
  Administered 2011-08-23: 2 mg via INTRAVENOUS

## 2011-08-23 MED ORDER — HYDRALAZINE HCL 20 MG/ML IJ SOLN
10.0000 mg | Freq: Once | INTRAMUSCULAR | Status: AC
  Administered 2011-08-23: 5 mg via INTRAVENOUS
  Filled 2011-08-23: qty 0.5

## 2011-08-23 MED ORDER — LINAGLIPTIN 5 MG PO TABS
5.0000 mg | ORAL_TABLET | Freq: Every day | ORAL | Status: DC
  Administered 2011-08-24 – 2011-08-25 (×2): 5 mg via ORAL
  Filled 2011-08-23 (×3): qty 1

## 2011-08-23 MED ORDER — LIDOCAINE HCL (CARDIAC) 20 MG/ML IV SOLN
INTRAVENOUS | Status: DC | PRN
  Administered 2011-08-23: 100 mg via INTRAVENOUS

## 2011-08-23 MED ORDER — HYDRALAZINE HCL 20 MG/ML IJ SOLN
5.0000 mg | Freq: Once | INTRAMUSCULAR | Status: AC
  Administered 2011-08-23: 5 mg via INTRAVENOUS

## 2011-08-23 MED ORDER — NEBIVOLOL HCL 10 MG PO TABS
20.0000 mg | ORAL_TABLET | Freq: Every day | ORAL | Status: DC
  Administered 2011-08-24 – 2011-08-25 (×2): 20 mg via ORAL
  Filled 2011-08-23 (×3): qty 2

## 2011-08-23 MED ORDER — BENAZEPRIL HCL 40 MG PO TABS
40.0000 mg | ORAL_TABLET | Freq: Every day | ORAL | Status: DC
  Administered 2011-08-24 – 2011-08-25 (×2): 40 mg via ORAL
  Filled 2011-08-23 (×3): qty 1

## 2011-08-23 MED ORDER — HYDROCORTISONE ACE-PRAMOXINE 2.5-1 % RE CREA
TOPICAL_CREAM | Freq: Two times a day (BID) | RECTAL | Status: DC
  Filled 2011-08-23: qty 30

## 2011-08-23 MED ORDER — PROPOFOL 10 MG/ML IV EMUL
INTRAVENOUS | Status: DC | PRN
  Administered 2011-08-23: 200 mg via INTRAVENOUS
  Administered 2011-08-23: 50 mg via INTRAVENOUS

## 2011-08-23 MED ORDER — ONDANSETRON HCL 4 MG/2ML IJ SOLN
INTRAMUSCULAR | Status: DC | PRN
  Administered 2011-08-23: 4 mg via INTRAVENOUS

## 2011-08-23 SURGICAL SUPPLY — 32 items
BLADE SURG 15 STRL LF DISP TIS (BLADE) ×1 IMPLANT
BLADE SURG 15 STRL SS (BLADE) ×1
CLOTH BEACON ORANGE TIMEOUT ST (SAFETY) ×2 IMPLANT
COVER MAYO STAND STRL (DRAPES) ×2 IMPLANT
COVER SURGICAL LIGHT HANDLE (MISCELLANEOUS) ×2 IMPLANT
DRAPE UTILITY 15X26 W/TAPE STR (DRAPE) ×2 IMPLANT
DRSG PAD ABDOMINAL 8X10 ST (GAUZE/BANDAGES/DRESSINGS) ×2 IMPLANT
ELECT CAUTERY BLADE 6.4 (BLADE) ×2 IMPLANT
ELECT REM PT RETURN 9FT ADLT (ELECTROSURGICAL) ×2
ELECTRODE REM PT RTRN 9FT ADLT (ELECTROSURGICAL) ×1 IMPLANT
GAUZE SPONGE 4X4 16PLY XRAY LF (GAUZE/BANDAGES/DRESSINGS) ×2 IMPLANT
GLOVE BIO SURGEON STRL SZ 6.5 (GLOVE) ×2 IMPLANT
GLOVE BIO SURGEON STRL SZ7 (GLOVE) ×2 IMPLANT
GLOVE BIOGEL PI IND STRL 7.5 (GLOVE) ×2 IMPLANT
GLOVE BIOGEL PI INDICATOR 7.5 (GLOVE) ×2
GOWN STRL NON-REIN LRG LVL3 (GOWN DISPOSABLE) ×2 IMPLANT
KIT BASIN OR (CUSTOM PROCEDURE TRAY) ×2 IMPLANT
KIT ROOM TURNOVER OR (KITS) ×2 IMPLANT
NEEDLE HYPO 25GX1X1/2 BEV (NEEDLE) ×2 IMPLANT
NS IRRIG 1000ML POUR BTL (IV SOLUTION) ×2 IMPLANT
PACK LITHOTOMY IV (CUSTOM PROCEDURE TRAY) ×2 IMPLANT
PAD ARMBOARD 7.5X6 YLW CONV (MISCELLANEOUS) ×2 IMPLANT
PENCIL BUTTON HOLSTER BLD 10FT (ELECTRODE) ×2 IMPLANT
SHEARS HARMONIC 9CM CVD (BLADE) ×2 IMPLANT
SPECIMEN JAR MEDIUM (MISCELLANEOUS) ×2 IMPLANT
SURGILUBE 2OZ TUBE FLIPTOP (MISCELLANEOUS) ×2 IMPLANT
SUT VIC AB 3-0 SH 18 (SUTURE) ×2 IMPLANT
SYR CONTROL 10ML LL (SYRINGE) ×2 IMPLANT
TOWEL OR 17X24 6PK STRL BLUE (TOWEL DISPOSABLE) ×2 IMPLANT
TOWEL OR 17X26 10 PK STRL BLUE (TOWEL DISPOSABLE) ×2 IMPLANT
TUBE CONNECTING 12X1/4 (SUCTIONS) ×2 IMPLANT
YANKAUER SUCT BULB TIP NO VENT (SUCTIONS) ×2 IMPLANT

## 2011-08-23 NOTE — ED Notes (Signed)
The pt is having rectal bleeding since he has rectal surgery this past Wednesday.  He has not stopped bleeding and the bleeding is increasing in amounts

## 2011-08-23 NOTE — Anesthesia Preprocedure Evaluation (Addendum)
Anesthesia Evaluation  Patient identified by MRN, date of birth, ID band Patient awake    Reviewed: Allergy & Precautions, H&P , NPO status , reviewed documented beta blocker date and time   Airway Mallampati: II TM Distance: >3 FB Neck ROM: Full    Dental  (+) Dental Advisory Given and Teeth Intact   Pulmonary sleep apnea and Continuous Positive Airway Pressure Ventilation , Current Smoker,          Cardiovascular hypertension, Pt. on home beta blockers Rhythm:Regular Rate:Normal     Neuro/Psych    GI/Hepatic GERD-  Medicated and Controlled,  Endo/Other  Diabetes mellitus-, Type 2, Oral Hypoglycemic Agents  Renal/GU      Musculoskeletal   Abdominal   Peds  Hematology   Anesthesia Other Findings   Reproductive/Obstetrics                         Anesthesia Physical Anesthesia Plan  ASA: III and Emergent  Anesthesia Plan: General   Post-op Pain Management:    Induction: Intravenous  Airway Management Planned: LMA  Additional Equipment:   Intra-op Plan:   Post-operative Plan: Extubation in OR  Informed Consent: I have reviewed the patients History and Physical, chart, labs and discussed the procedure including the risks, benefits and alternatives for the proposed anesthesia with the patient or authorized representative who has indicated his/her understanding and acceptance.   Dental advisory given  Plan Discussed with: CRNA, Anesthesiologist and Surgeon  Anesthesia Plan Comments:        Anesthesia Quick Evaluation

## 2011-08-23 NOTE — ED Notes (Signed)
MD at bedside. CCS

## 2011-08-23 NOTE — ED Notes (Signed)
MD at bedside. 

## 2011-08-23 NOTE — ED Provider Notes (Signed)
History     CSN: [id number]  Arrival date & time 08/23/11  0206   First MD Initiated Contact with Patient 08/23/11 0413      Chief Complaint  Patient presents with  . Rectal Bleeding    (Consider location/radiation/quality/duration/timing/severity/associated sxs/prior treatment) The history is provided by the patient and the spouse.  pt had surgery for rectal fissure 3 d ago by dr. Jaclynn Guarneri.  Now c/o pain in rectal area with swelling. No other sxs.   Not on anticoagulants.    Past Medical History  Diagnosis Date  . Hypertension   . Hyperlipidemia   . Diabetes mellitus     type II  . LVH (left ventricular hypertrophy)     with systolic dysfunction by echo 10/03, EF 45-50% - Dr. Elsie Lincoln  . OSA (obstructive sleep apnea)   . DIABETES MELLITUS, TYPE II 08/29/2009  . HYPERLIPIDEMIA 08/29/2009  . HYPERTENSION 01/14/2008  . SLEEP APNEA, OBSTRUCTIVE 01/14/2008  . GERD (gastroesophageal reflux disease) 08/12/2010    History reviewed. No pertinent past surgical history.  Family History  Problem Relation Age of Onset  . Hypertension Mother     History  Substance Use Topics  . Smoking status: Current Everyday Smoker -- 1.0 packs/day    Types: Cigarettes  . Smokeless tobacco: Not on file  . Alcohol Use: No      Review of Systems  Constitutional: Negative for fever.  Gastrointestinal: Positive for anal bleeding. Negative for nausea, vomiting and abdominal pain.       Rectal pain and mass  All other systems reviewed and are negative.    Allergies  Sulfa antibiotics and Vicodin  Home Medications   Current Outpatient Rx  Name Route Sig Dispense Refill  . AMLODIPINE BESY-BENAZEPRIL HCL 10-40 MG PO CAPS Oral Take 1 capsule by mouth daily. 90 capsule 3  . ASPIRIN 81 MG PO TBEC Oral Take 81 mg by mouth daily.      Marland Kitchen BYSTOLIC 20 MG PO TABS  TAKE 1 TABLET EVERY DAY 90 tablet 3  . [employer] STOOL SOFTENER PO Oral Take 2 tablets by mouth 2 (two) times daily.     Marland Kitchen  HYDROCODONE-ACETAMINOPHEN 5-500 MG PO TABS Oral Take 1.5 tablets by mouth every 4 (four) hours as needed. For pain    . IBUPROFEN 200 MG PO TABS Oral Take 400 mg by mouth every 6 (six) hours as needed. For pain    . JANUVIA 100 MG PO TABS  TAKE 1 TABLET (100 MG TOTAL) BY MOUTH DAILY. 90 tablet 3  . LIDOCAINE-HYDROCORTISONE ACE 3-0.5 % RE CREA Rectal Place 1 Applicatorful rectally 2 (two) times daily. 28.35 g 0  . METFORMIN HCL 500 MG PO TABS Oral Take 2 tablets (1,000 mg total) by mouth 2 (two) times daily. 120 tablet 11  . OMEPRAZOLE 20 MG PO CPDR Oral Take 20 mg by mouth daily.      Marland Kitchen PRAVASTATIN SODIUM 40 MG PO TABS Oral Take 1 tablet (40 mg total) by mouth daily. 90 tablet 3  . ONETOUCH ULTRASOFT LANCETS MISC  TEST AS DIRECTED 100 each 11  . ONETOUCH TEST VI STRP  TEST EVERY MORNING BEFORE EATING AND BEFORE LUNCH AND DINNER 100 each 11    BP 166/96  Pulse 67  Temp(Src) 99 F (37.2 C) (Oral)  Resp 18  SpO2 98%  Physical Exam  Constitutional: He is oriented to person, place, and time. He appears well-developed and well-nourished. No distress.  HENT:  Head: Normocephalic and atraumatic.  Eyes: EOM are normal.  Neck: Normal range of motion.  Pulmonary/Chest: Effort normal.  Genitourinary:       Mass protruding from rectum the size of an orange with purple mucosa. No active bleeding.   Musculoskeletal: Normal range of motion.  Neurological: He is alert and oriented to person, place, and time.  Skin: Skin is warm and dry.  Psychiatric: He has a normal mood and affect. Thought content normal.    ED Course  Procedures (including critical care time) Rectal prolapse vs hemorrhoids Will check labs and consult surgery. I think pt will need to go to or for eval and reduction of prolapse under sedation  Labs Reviewed  COMPREHENSIVE METABOLIC PANEL - Abnormal; Notable for the following:    Potassium 3.1 (*)    Glucose, Bld 124 (*)    Total Bilirubin 0.2 (*)    All other components  within normal limits  CBC  DIFFERENTIAL  SAMPLE TO BLOOD BANK   No results found.   No diagnosis found.  4:49 AM Spoke with Dr. Janee Morn.  He will come eval pt in ed  MDM  Rectal prolapse - suspected vs hemorrhoids        Cheri Guppy, MD 08/23/11 [phone]

## 2011-08-23 NOTE — Anesthesia Procedure Notes (Addendum)
Procedure Name: LMA Insertion Date/Time: 08/23/2011 11:17 AM Performed by: Sharlene Dory E Pre-anesthesia Checklist: Patient identified, Emergency Drugs available, Suction available, Patient being monitored and Timeout performed Patient Re-evaluated:Patient Re-evaluated prior to inductionOxygen Delivery Method: Circle system utilized Preoxygenation: Pre-oxygenation with 100% oxygen Intubation Type: IV induction LMA: LMA with gastric port inserted LMA Size: 4.0 Number of attempts: 1 Placement Confirmation: ETT inserted through vocal cords under direct vision,  positive ETCO2 and breath sounds checked- equal and bilateral Tube secured with: Tape Dental Injury: Teeth and Oropharynx as per pre-operative assessment    Performed by: Quentin Ore

## 2011-08-23 NOTE — Progress Notes (Signed)
Notified Dr. Corliss Skains of pt's temp. 102.7. Patient denies pain. Gave him incentive spirometer and instructed him in use. He was able to perform return demonstration.

## 2011-08-23 NOTE — ED Notes (Signed)
Pt presents w/ rectal pain and bleeding - pt states he had a sphincterectomy on Wednesday for "a knot on the rectum" pt states he was not told was for hemorrhoids. Pt reports bleeding since Wednesday, has gotten progressively worse and noted clots - pt w/ x1 episode of diarrhea since surgery, pt denies dizziness or lightheadedness - c/o extreme pain to rectum and abd cramping. Pt noted to have rectal prolapse on exam, no gross bleeding noted however pt did have a saturated brief prior to assessment. Pt A&Ox4, lying prone on stretcher for comfort, family at bedside x1.

## 2011-08-23 NOTE — Preoperative (Signed)
Beta Blockers   Reason not to administer Beta Blockers:Not Applicable 

## 2011-08-23 NOTE — ED Notes (Signed)
Iv started rt a-c nss 250ml/hr

## 2011-08-23 NOTE — H&P (Signed)
This patient underwent a sphincterotomy on 5/22 by Dr. Johna Sheriff.  This was performed at [facility], so the operative report is not available.  Ever since surgery, he has developed worsening swelling and severe pain at his perirectal region.  There has been some bleeding as well.  He has been able to have two bowel movements.    Lab Results  Component Value Date   WBC 8.8 08/23/2011   HGB 14.4 08/23/2011   HCT 41.9 08/23/2011   MCV 93.7 08/23/2011   PLT 201 08/23/2011   Exam. Filed Vitals:   08/23/11 0657  BP: 155/93  Pulse: 67  Temp: 98.5 F (36.9 C)  Resp: 18   WDWN in obvious discomfort Rectal - very large swelling bilaterally - hematoma trapped under closure; exquisitely tender  Imp:  Post-op hematoma after sphincterotomy Plan:  Admit to hospital To OR for EUA/ evacuation of hematoma  The surgical procedure has been discussed with the patient.  Potential risks, benefits, alternative treatments, and expected outcomes have been explained.  All of the patient's questions at this time have been answered.  The likelihood of reaching the patient's treatment goal is good.  The patient understand the proposed surgical procedure and wishes to proceed. Wilmon Arms. Corliss Skains, MD, [facility]  08/23/2011 8:03 AM     Progress Notes     Chief complaint: Rectal pain  History: Patient is a 51 year old male referred by Dr. Jonny Ruiz for persistent rectal pain. This has been an intermittent problem for him for almost a year but in recent weeks to months has become more of a constant issue. He describes significant burning pain after bowel movements. It then we'll gradually get better until his next bowel movement but tends to bother and throughout the day. He has had occasional small amount of bleeding. He feels there is swelling and tissue at his anus after bowel movements. He had a colonoscopy in 2011. He is using a stool softener in his stools are now soft and easily pass.     Past Medical  History     Diagnosis  Date     .  Hypertension      .  Hyperlipidemia      .  Diabetes mellitus        type II     .  LVH (left ventricular hypertrophy)        with systolic dysfunction by echo 10/03, EF 45-50% - Dr. Elsie Lincoln     .  OSA (obstructive sleep apnea)      .  DIABETES MELLITUS, TYPE II  08/29/2009     .  HYPERLIPIDEMIA  08/29/2009     .  HYPERTENSION  01/14/2008     .  SLEEP APNEA, OBSTRUCTIVE  01/14/2008     .  GERD (gastroesophageal reflux disease)  08/12/2010     No past surgical history on file.     Current Outpatient Prescriptions     Medication  Sig  Dispense  Refill     .  amLODipine-benazepril (LOTREL) 10-40 MG per capsule  Take 1 capsule by mouth daily.  90 capsule  3     .  aspirin 81 MG EC tablet  Take 81 mg by mouth daily.       Marland Kitchen  BYSTOLIC 20 MG TABS  TAKE 1 TABLET EVERY DAY  90 tablet  3     .  Docusate Calcium ([employer]S STOOL SOFTENER PO)  Take by mouth 2 (two) times daily.       Marland Kitchen  JANUVIA 100 MG tablet  TAKE 1 TABLET (100 MG TOTAL) BY MOUTH DAILY.  90 tablet  3     .  Lancets (ONETOUCH ULTRASOFT) lancets  TEST AS DIRECTED  100 each  11     .  lidocaine-hydrocortisone (ANAMANTEL HC) 3-0.5 % CREA  Place 1 Applicatorful rectally 2 (two) times daily.  28.35 g  0     .  metFORMIN (GLUCOPHAGE) 500 MG tablet  Take 2 tablets (1,000 mg total) by mouth 2 (two) times daily.  120 tablet  11     .  omeprazole (PRILOSEC) 20 MG capsule  Take 20 mg by mouth daily.       .  ONE TOUCH TEST STRIPS test strip  TEST EVERY MORNING BEFORE EATING AND BEFORE LUNCH AND DINNER  100 each  11     .  pravastatin (PRAVACHOL) 40 MG tablet  Take 1 tablet (40 mg total) by mouth daily.  90 tablet  3     No Known Allergies     History     Substance Use Topics     .  Smoking status:  Current Everyday Smoker -- 1.0 packs/day       Types:  Cigarettes     .  Smokeless tobacco:  Not on file     .  Alcohol Use:  No

## 2011-08-23 NOTE — Op Note (Signed)
Preop diagnosis: Enlarged prolapsed thrombosed internal and external hemorrhoids Postop diagnosis: Same Procedure performed: Examination under anesthesia with 3 column hemorrhoidectomy Surgeon Harlie Buening K. Anesthesia: Gen. 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 Since surgery the patient has had progressively worsening swelling and bleeding. His pain has become uncontrolled with by mouth pain medications so he came to the emergency department. He was noted to have massively enlarged thrombosed and bleeding hemorrhoids. We were then consulted to see the patient. I made the decision to proceed directly to the operating room.  Description of procedure: The patient was brought to the operating room and placed in a supine position on the operating room table. After an adequate level of general anesthesia was obtained, the patient's legs were placed in lithotomy position in yellowfin stirrups. His perineum was prepped with Betadine and draped in sterile fashion. A timeout was taken to ensure the proper patient and proper procedure. The 3 hemorrhoids are massively enlarged. We infiltrated in the intersphincteric groove with 10 mL of Exparel.  I used the harmonic scalpel to remove the enlarged hemorrhoidal tissue.  There was large amounts of clot in each hemorrhoid.  We inspected carefully for hemostasis. The rectal mucosa is a couple of centimeters above the dentate line and the perianal skin. I used 4 separate 3-0 Vicryl sutures to reapproximate some of the mucosa and the perianal skin. We then packed the anal canal with Gelfoam and applied dibucaine ointment. I used the remainder of the Exparel. A dry dressing was applied. The patient was then extubated and brought to the recovery room in stable condition. All sponge, initially, and needle counts are correct.  Wilmon Arms. Corliss Skains, MD, [facility]  08/23/2011 12:56 PM

## 2011-08-23 NOTE — Transfer of Care (Signed)
Immediate Anesthesia Transfer of Care Note  Patient: [name]  Procedure(s) Performed: Procedure(s) (LRB): EXAM UNDER ANESTHESIA WITH HEMORRHOIDECTOMY (N/A)  Patient Location: PACU  Anesthesia Type: General  Level of Consciousness: sedated  Airway & Oxygen Therapy: Patient Spontanous Breathing and Patient connected to face mask oxygen  Post-op Assessment: Report given to PACU RN, Post -op Vital signs reviewed and stable and Patient moving all extremities X 4  Post vital signs: Reviewed and stable  Complications: No apparent anesthesia complications

## 2011-08-23 NOTE — Anesthesia Postprocedure Evaluation (Signed)
  Anesthesia Post-op Note  Patient: [name]  Procedure(s) Performed: Procedure(s) (LRB): EXAM UNDER ANESTHESIA WITH HEMORRHOIDECTOMY (N/A)  Patient Location: PACU  Anesthesia Type: General  Level of Consciousness: awake, alert  and oriented  Airway and Oxygen Therapy: Patient Spontanous Breathing and Patient connected to nasal cannula oxygen  Post-op Pain: mild  Post-op Assessment: Post-op Vital signs reviewed  Post-op Vital Signs: Reviewed  Complications: No apparent anesthesia complications

## 2011-08-24 LAB — GLUCOSE, CAPILLARY
Glucose-Capillary: 111 mg/dL — ABNORMAL HIGH (ref 70–99)
Glucose-Capillary: 120 mg/dL — ABNORMAL HIGH (ref 70–99)

## 2011-08-24 LAB — BASIC METABOLIC PANEL
Calcium: 9.2 mg/dL (ref 8.4–10.5)
GFR calc non Af Amer: >90 mL/min (ref >90)
Sodium: 139 mEq/L (ref 135–145)

## 2011-08-24 LAB — CBC
MCH: 32.8 pg (ref 26.0–34.0)
MCHC: 34.9 g/dL (ref 30.0–36.0)
Platelets: 184 10*3/uL (ref 150–400)

## 2011-08-24 LAB — HEMOGLOBIN A1C: Hgb A1c MFr Bld: 5.9 % — ABNORMAL HIGH (ref <5.7)

## 2011-08-24 MED ORDER — MAGNESIUM HYDROXIDE 400 MG/5ML PO SUSP
30.0000 mL | Freq: Two times a day (BID) | ORAL | Status: DC
  Administered 2011-08-24: 30 mL via ORAL
  Filled 2011-08-24 (×4): qty 30

## 2011-08-24 NOTE — Progress Notes (Signed)
1 Day Post-Op  Subjective: Sore.  Objective: Vital signs in last 24 hours: Temp:  [97 F (36.1 C)-102.7 F (39.3 C)] 98.4 F (36.9 C) (05/26 0628) Pulse Rate:  [64-122] 75  (05/26 0628) Resp:  [7-23] 18  (05/26 0628) BP: (133-207)/(72-135) 134/85 mmHg (05/26 0628) SpO2:  [87 %-100 %] 98 % (05/26 0628) Weight:  [194 lb (87.998 kg)] 194 lb (87.998 kg) (05/25 1545)    Intake/Output from previous day: 05/25 0701 - 05/26 0700 In: 700 [I.V.:700] Out: 950 [Urine:850; Blood:100] Intake/Output this shift:    PE: Anorectal-some dried blood and swelling present  Lab Results:   Basename 08/24/11 0703 08/23/11 0219  WBC 16.0* 8.8  HGB 13.5 14.4  HCT 38.7* 41.9  PLT 184 201   BMET  Basename 08/24/11 0703 08/23/11 0219  NA 139 139  K 3.2* 3.1*  CL 102 101  CO2 25 26  GLUCOSE 114* 124*  BUN 14 10  CREATININE 0.98 0.83  CALCIUM 9.2 8.9   PT/INR No results found for this basename: LABPROT:2,INR:2 in the last 72 hours Comprehensive Metabolic Panel:    Component Value Date/Time   NA 139 08/24/2011 0703   K 3.2* 08/24/2011 0703   CL 102 08/24/2011 0703   CO2 25 08/24/2011 0703   BUN 14 08/24/2011 0703   CREATININE 0.98 08/24/2011 0703   GLUCOSE 114* 08/24/2011 0703   CALCIUM 9.2 08/24/2011 0703   AST 11 08/23/2011 0219   ALT 10 08/23/2011 0219   ALKPHOS 57 08/23/2011 0219   BILITOT 0.2* 08/23/2011 0219   PROT 6.8 08/23/2011 0219   ALBUMIN 3.9 08/23/2011 0219     Studies/Results: No results found.  Anti-infectives: Anti-infectives     Start     Dose/Rate Route Frequency Ordered Stop   08/23/11 2230  piperacillin-tazobactam (ZOSYN) IVPB 3.375 g       3.375 g 100 mL/hr over 30 Minutes Intravenous 3 times per day 08/23/11 2150            Assessment Active Problems:  Prolapsed and thrombosed hemorrhoid s/p 3 column hemorrhoidectomy-fever spike this AM    LOS: 1 day   Plan: Bowel regimen.  Start sitz baths.  Continue IV abxs.   Adolph Pollack 08/24/2011

## 2011-08-24 NOTE — Progress Notes (Addendum)
Patient had sitz bath x 2 this shift. Sat for less than 20 minutes. Had 2 soft bowel movements. Mild bleeding to anal area. Abdominal pads applied with underwear mesh.

## 2011-08-25 LAB — CBC
HCT: 35.5 % — ABNORMAL LOW (ref 39.0–52.0)
MCHC: 34.6 g/dL (ref 30.0–36.0)
RDW: 13 % (ref 11.5–15.5)
WBC: 14.5 10*3/uL — ABNORMAL HIGH (ref 4.0–10.5)

## 2011-08-25 LAB — GLUCOSE, CAPILLARY

## 2011-08-25 MED ORDER — CIPROFLOXACIN HCL 500 MG PO TABS: 500.0000 mg | ORAL_TABLET | Freq: Two times a day (BID) | ORAL | Status: AC

## 2011-08-25 MED ORDER — OXYCODONE-ACETAMINOPHEN 5-325 MG PO TABS: 1.0000 | ORAL_TABLET | ORAL | Status: AC | PRN

## 2011-08-25 MED ORDER — METRONIDAZOLE 500 MG PO TABS: 500.0000 mg | ORAL_TABLET | Freq: Three times a day (TID) | ORAL | Status: AC

## 2011-08-25 NOTE — Progress Notes (Signed)
Discharge instructions/Med Rec Sheet reviewed w/ pt. Pt expressed understanding and copies given w/ prescriptions. Pt d/c'd in stable condition via w/c, accompanied by NT  

## 2011-08-25 NOTE — Progress Notes (Signed)
2 Days Post-Op  Subjective: Had a BM.  Less sore.  Objective: Vital signs in last 24 hours: Temp:  [98.6 F (37 C)-99.3 F (37.4 C)] 98.6 F (37 C) (05/27 1100) Pulse Rate:  [69-81] 69  (05/27 1100) Resp:  [18] 18  (05/27 1100) BP: (141-148)/(76-93) 148/93 mmHg (05/27 1100) SpO2:  [95 %-100 %] 100 % (05/27 1100) Last BM Date: 08/24/11  Intake/Output from previous day: 05/26 0701 - 05/27 0700 In: 1320 [P.O.:1320] Out: 2077 [Urine:2075; Stool:2] Intake/Output this shift: Total I/O In: 120 [P.O.:120] Out: -   PE: Anorectal-some thin bloody drainage is present but no redness or sign of infection  Lab Results:   Basename 08/25/11 0550 08/24/11 0703  WBC 14.5* 16.0*  HGB 12.3* 13.5  HCT 35.5* 38.7*  PLT 174 184   BMET  Basename 08/24/11 0703 08/23/11 0219  NA 139 139  K 3.2* 3.1*  CL 102 101  CO2 25 26  GLUCOSE 114* 124*  BUN 14 10  CREATININE 0.98 0.83  CALCIUM 9.2 8.9   PT/INR No results found for this basename: LABPROT:2,INR:2 in the last 72 hours Comprehensive Metabolic Panel:    Component Value Date/Time   NA 139 08/24/2011 0703   K 3.2* 08/24/2011 0703   CL 102 08/24/2011 0703   CO2 25 08/24/2011 0703   BUN 14 08/24/2011 0703   CREATININE 0.98 08/24/2011 0703   GLUCOSE 114* 08/24/2011 0703   CALCIUM 9.2 08/24/2011 0703   AST 11 08/23/2011 0219   ALT 10 08/23/2011 0219   ALKPHOS 57 08/23/2011 0219   BILITOT 0.2* 08/23/2011 0219   PROT 6.8 08/23/2011 0219   ALBUMIN 3.9 08/23/2011 0219     Studies/Results: No results found.  Anti-infectives: Anti-infectives     Start     Dose/Rate Route Frequency Ordered Stop   08/23/11 2230   piperacillin-tazobactam (ZOSYN) IVPB 3.375 g        3.375 g 100 mL/hr over 30 Minutes Intravenous 3 times per day 08/23/11 2150            Assessment Active Problems:  Prolapsed and thrombosed hemorrhoid s/p 3 column hemorrhoidectomy-doing better    LOS: 2 days   Plan: Discharge on po abxs.  Instructions given.  Follow  up with Dr. Johna Sheriff Friday.   [name] 08/25/2011

## 2011-08-25 NOTE — Discharge Instructions (Signed)
[facility] _______[facility]  RECTAL SURGERY POST OP INSTRUCTIONS: POST OP INSTRUCTIONS  Always review your discharge instruction sheet given to you by the facility where your surgery was performed. IF YOU HAVE DISABILITY OR FAMILY LEAVE FORMS, YOU MUST BRING THEM TO THE OFFICE FOR PROCESSING.   DO NOT GIVE THEM TO YOUR DOCTOR.  1. A  prescription for pain medication may be given to you upon discharge.  Take your pain medication as prescribed, if needed.  If narcotic pain medicine is not needed, then you may take acetaminophen (Tylenol) or ibuprofen (Advil) as needed. 2. Take your usually prescribed medications unless otherwise directed. 3. If you need a refill on your pain medication, please contact your pharmacy.  They will contact our office to request authorization. Prescriptions will not be filled after 5 pm or on week-ends. 4. You should follow a light diet the first 48 hours after arrival home, such as soup and crackers, etc.  Be sure to include lots of fluids daily.  Resume your normal diet 2-3 days after surgery.. 5. Most patients will experience some swelling and discomfort in the rectal area. Ice packs, reclining and warm tub soaks will help.  Swelling and discomfort can take several days to resolve.  6. It is common to experience some constipation if taking pain medication after surgery.  Increasing fluid intake and taking a stool softener (such as Colace) will usually help or prevent this problem from occurring.  A mild laxative (Milk of Magnesia or Miralax) should be taken according to package directions if there are no bowel movements after 48 hours. 7. Unless discharge instructions indicate otherwise, leave your bandage dry and in place for 24 hours, or remove the bandage if you have a bowel movement. You may notice a small amount of bleeding with bowel movements for the first few days. You may have some packing in the rectum which will come out over the first day or two. You  will need to wear an absorbent pad or soft cotton gauze in your underwear until the drainage stops.it. 8. ACTIVITIES:  You may resume regular (light) daily activities beginning the next day--such as daily self-care, walking, climbing stairs--gradually increasing activities as tolerated.  You may have sexual intercourse when it is comfortable.  Refrain from any heavy lifting or straining until approved by your doctor. a. You may drive when you are no longer taking prescription pain medication, you can comfortably wear a seatbelt, and you can safely maneuver your car and apply brakes. b. RETURN TO WORK: : ____When released by MD________________ c.  9. You should see your doctor in the office for a follow-up appointment approximately 2-3 weeks after your surgery.  Make sure that you call for this appointment within a day or two after you arrive home to insure a convenient appointment time. 10. OTHER INSTRUCTIONS:  __________________________________________________________________________________________________________________________________________________________________________________________  WHEN TO CALL YOUR DOCTOR: 1. Fever over 101.5 2. Inability to urinate 3. Nausea and/or vomiting 4. Extreme swelling or bruising 5. Continued bleeding from rectum. 6. Increased pain, redness, or drainage from the incision 7. Constipation  The clinic staff is available to answer your questions during regular business hours.  Please don't hesitate to call and ask to speak to one of the nurses for clinical concerns.  If you have a medical emergency, go to the nearest emergency room or call 911.  A surgeon from [facility] is always on call at the hospital   [address], [place],  [place]  [address] ?  [address] [address], [place], [place]   [address] [phone] ? [phone] ? FAX [phone] Web site: [email or web address]  

## 2011-08-27 NOTE — Discharge Summary (Signed)
Date of admission 08/23/11 Date of discharge 08/25/11 Admission diagnosis - thrombosed prolapsed internal hemorrhoids Discharge diagnosis - same Procedure - hemorrhoidectomy  Brief history: 51 year old male who is status post a lateral internal sphincterotomy on 08/20/11 presented to the emergency department with severe swelling and bleeding. He was examined and was noted to have massively enlarged prolapsed thrombosed internal hemorrhoids.  Hospital course the patient is brought to the operating room for 3 column hemorrhoidectomy. We tried to reapproximate the mucosa down to the edge of the anoderm. Postoperatively the patient felt much better with still having considerable discomfort. He is much better on postop day #2 and is ready to go home and p.r.n. pain medications and stool softeners.  Discharge structures: Frequent sitz baths. Stool softeners as needed to avoid constipation. Followup in one to 2 weeks with Dr. Johna Sheriff. No heavy lifting.  Wilmon Arms. Corliss Skains, MD, [facility]  08/27/2011 2:13 PM

## 2011-08-29 ENCOUNTER — Encounter (INDEPENDENT_AMBULATORY_CARE_PROVIDER_SITE_OTHER): Payer: Self-pay | Admitting: General Surgery

## 2011-08-29 ENCOUNTER — Ambulatory Visit (INDEPENDENT_AMBULATORY_CARE_PROVIDER_SITE_OTHER): Payer: 59 | Admitting: General Surgery

## 2011-08-29 VITALS — BP 150/100 | HR 78 | Temp 97.4°F | Resp 14 | Ht 73.5 in | Wt 190.2 lb

## 2011-08-29 DIAGNOSIS — Z09 Encounter for follow-up examination after completed treatment for conditions other than malignant neoplasm: Secondary | ICD-10-CM

## 2011-08-29 NOTE — Progress Notes (Signed)
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 At the time of exam under anesthesia he also was found to have large internal hemorrhoids although they were not inflamed or thrombosed. Unfortunately postoperatively he developed acute thrombosis and had to be returned to the operating room for formal hemorrhoidectomy. He's had a fair amount of expected pain but is starting to feel better. No unusual bleeding or other concerns.  On exam his hemorrhoidectomy incisions are healing well. There is no perianal swelling or tenderness or erythema.  Assessment plan: Doing well at this point following anal sphincterotomy and subsequent emergent hemorrhoidectomy. He'll need to be off work for 3 weeks. I refilled his Tylox prescription. I will see him back in one month

## 2011-09-01 ENCOUNTER — Encounter (INDEPENDENT_AMBULATORY_CARE_PROVIDER_SITE_OTHER): Payer: Self-pay

## 2011-09-29 ENCOUNTER — Other Ambulatory Visit: Payer: Self-pay

## 2011-09-29 MED ORDER — GLUCOSE BLOOD VI STRP: ORAL_STRIP | Status: DC

## 2011-10-14 ENCOUNTER — Encounter (INDEPENDENT_AMBULATORY_CARE_PROVIDER_SITE_OTHER): Payer: 59 | Admitting: General Surgery

## 2011-10-17 ENCOUNTER — Ambulatory Visit (INDEPENDENT_AMBULATORY_CARE_PROVIDER_SITE_OTHER): Payer: 59 | Admitting: General Surgery

## 2011-10-17 ENCOUNTER — Encounter (INDEPENDENT_AMBULATORY_CARE_PROVIDER_SITE_OTHER): Payer: Self-pay | Admitting: General Surgery

## 2011-10-17 VITALS — BP 154/92 | HR 69 | Temp 98.2°F | Ht 73.0 in | Wt 196.8 lb

## 2011-10-17 DIAGNOSIS — Z09 Encounter for follow-up examination after completed treatment for conditions other than malignant neoplasm: Secondary | ICD-10-CM

## 2011-10-17 NOTE — Progress Notes (Signed)
History: Patient returns for more long-term followup after sphincterotomy for chronic anal fissure after which he developed severe thrombosed hemorrhoids requiring emergency hemorrhoidectomy. He now is feeling well. He denies any anal discomfort or bleeding. No incontinence.  Exam: On rectal exam all wounds are well-healed. Digital exam is negative and nontender.  Assessment and plan: Doing well with symptoms resolved and no complications. He's discharged return as needed.

## 2011-10-21 ENCOUNTER — Ambulatory Visit (INDEPENDENT_AMBULATORY_CARE_PROVIDER_SITE_OTHER): Payer: 59 | Admitting: Internal Medicine

## 2011-10-21 ENCOUNTER — Encounter: Payer: Self-pay | Admitting: Internal Medicine

## 2011-10-21 VITALS — BP 148/92 | HR 71 | Temp 98.0°F | Ht 73.0 in | Wt 196.4 lb

## 2011-10-21 DIAGNOSIS — E785 Hyperlipidemia, unspecified: Secondary | ICD-10-CM

## 2011-10-21 DIAGNOSIS — I10 Essential (primary) hypertension: Secondary | ICD-10-CM

## 2011-10-21 DIAGNOSIS — E119 Type 2 diabetes mellitus without complications: Secondary | ICD-10-CM

## 2011-10-21 MED ORDER — NEBIVOLOL HCL 20 MG PO TABS: 2.0000 | ORAL_TABLET | Freq: Every day | ORAL | Status: DC

## 2011-10-21 NOTE — Patient Instructions (Addendum)
Please increase the bystolic to 40 mg per day Continue all other medications as before Please check your blood pressure on a regular basis, your goal is to be at least 140/90

## 2011-10-25 ENCOUNTER — Encounter: Payer: Self-pay | Admitting: Internal Medicine

## 2011-10-25 NOTE — Assessment & Plan Note (Signed)
Mild uncontroled, to increase bystolic to 40 mg, f/u with BP at home, and next visit

## 2011-10-25 NOTE — Assessment & Plan Note (Signed)
stable overall by hx and exam, most recent data reviewed with pt, and pt to continue medical treatment as before Lab Results  Component Value Date   HGBA1C 5.9* 08/23/2011

## 2011-10-25 NOTE — Assessment & Plan Note (Signed)
stable overall by hx and exam, most recent data reviewed with pt, and pt to continue medical treatment as before  Lab Results  Component Value Date   LDLCALC 69 02/07/2011    

## 2011-10-25 NOTE — Progress Notes (Signed)
Subjective:    Patient ID: [name], male    DOB: [birth year], 51 y.o.   MRN: [id number]  HPI  Here to f/u; overall doing ok,  Pt denies chest pain, increased sob or doe, wheezing, orthopnea, PND, increased LE swelling, palpitations, dizziness or syncope.  Pt denies new neurological symptoms such as new headache, or facial or extremity weakness or numbness   Pt denies polydipsia, polyuria, or low sugar symptoms such as weakness or confusion improved with po intake.  Pt states overall good compliance with meds, trying to follow lower cholesterol, diabetic diet, wt overall stable but little exercise however, but BP has been mild elevated with several checks at home in the past 2 wks.  Does have sense of ongoing fatigue, but denies signficant hypersomnolence. Past Medical History  Diagnosis Date  . Hypertension   . Hyperlipidemia   . Diabetes mellitus     type II  . LVH (left ventricular hypertrophy)     with systolic dysfunction by echo 10/03, EF 45-50% - Dr. Elsie Lincoln  . OSA (obstructive sleep apnea)   . DIABETES MELLITUS, TYPE II 08/29/2009  . HYPERLIPIDEMIA 08/29/2009  . HYPERTENSION 01/14/2008  . SLEEP APNEA, OBSTRUCTIVE 01/14/2008  . GERD (gastroesophageal reflux disease) 08/12/2010   Past Surgical History  Procedure Date  . Anal sphincterotomy 08/20/11    reports that he has been smoking Cigarettes.  He has been smoking about 1 pack per day. He does not have any smokeless tobacco history on file. He reports that he does not drink alcohol or use illicit drugs. family history includes Hypertension in his mother. Allergies  Allergen Reactions  . Sulfa Antibiotics Hives and Swelling  . Vicodin (Hydrocodone-Acetaminophen) Itching   Current Outpatient Prescriptions on File Prior to Visit  Medication Sig Dispense Refill  . amLODipine-benazepril (LOTREL) 10-40 MG per capsule Take 1 capsule by mouth daily.  90 capsule  3  . aspirin 81 MG EC tablet Take 81 mg by mouth daily.        Tery Sanfilippo Calcium ([facility] STOOL SOFTENER PO) Take 2 tablets by mouth 2 (two) times daily.       Marland Kitchen glucose blood (ONE TOUCH TEST STRIPS) test strip Test three times a day as instructed.  100 each  11  . JANUVIA 100 MG tablet TAKE 1 TABLET (100 MG TOTAL) BY MOUTH DAILY.  90 tablet  3  . Lancets (ONETOUCH ULTRASOFT) lancets TEST AS DIRECTED  100 each  11  . metFORMIN (GLUCOPHAGE) 500 MG tablet Take 2 tablets (1,000 mg total) by mouth 2 (two) times daily.  120 tablet  11  . omeprazole (PRILOSEC) 20 MG capsule Take 20 mg by mouth daily.        . pravastatin (PRAVACHOL) 40 MG tablet Take 1 tablet (40 mg total) by mouth daily.  90 tablet  3  . HYDROcodone-acetaminophen (VICODIN) 5-500 MG per tablet Take 1.5 tablets by mouth every 4 (four) hours as needed. For pain      . ibuprofen (ADVIL,MOTRIN) 200 MG tablet Take 400 mg by mouth every 6 (six) hours as needed. For pain      . lidocaine-hydrocortisone (ANAMANTEL HC) 3-0.5 % CREA Place 1 Applicatorful rectally 2 (two) times daily.  28.35 g  0  . Nebivolol HCl (BYSTOLIC) 20 MG TABS Take 2 tablets (40 mg total) by mouth daily.  60 tablet  11   Review of Systems Constitutional: Negative for diaphoresis and unexpected weight change.  Eyes: Negative for photophobia and visual disturbance.  Respiratory: Negative for choking and stridor.   Gastrointestinal: Negative for vomiting and blood in stool.  Genitourinary: Negative for hematuria and decreased urine volume.  Musculoskeletal: Negative for gait problem.  Skin: Negative for color change and wound.  Neurological: Negative for tremors and numbness.      Objective:   Physical Exam BP 148/92  Pulse 71  Temp 98 F (36.7 C) (Oral)  Ht 6\' 1"  (1.854 m)  Wt 196 lb 6 oz (89.075 kg)  BMI 25.91 kg/m2  SpO2 97% Physical Exam  VS noted Constitutional: Pt appears well-developed and well-nourished.  HENT: Head: Normocephalic.  Right Ear: External ear normal.  Left Ear: External ear normal.  Eyes: Conjunctivae  and EOM are normal. Pupils are equal, round, and reactive to light.  Neck: Normal range of motion. Neck supple.  Cardiovascular: Normal rate and regular rhythm.   Pulmonary/Chest: Effort normal and breath sounds normal.  Neurological: Pt is alert. Not confused Skin: Skin is warm. No erythema.  Psychiatric: Pt behavior is normal. Thought content normal.     Assessment & Plan:

## 2011-12-05 ENCOUNTER — Other Ambulatory Visit: Payer: Self-pay | Admitting: Internal Medicine

## 2012-01-28 ENCOUNTER — Other Ambulatory Visit: Payer: Self-pay | Admitting: Internal Medicine

## 2012-02-02 ENCOUNTER — Other Ambulatory Visit: Payer: Self-pay | Admitting: Internal Medicine

## 2012-02-25 ENCOUNTER — Other Ambulatory Visit: Payer: Self-pay | Admitting: Internal Medicine

## 2012-03-22 ENCOUNTER — Other Ambulatory Visit: Payer: Self-pay | Admitting: Internal Medicine

## 2012-05-13 ENCOUNTER — Encounter: Payer: Self-pay | Admitting: Pharmacist Clinician (PhC)/ Clinical Pharmacy Specialist

## 2012-08-16 ENCOUNTER — Ambulatory Visit (INDEPENDENT_AMBULATORY_CARE_PROVIDER_SITE_OTHER): Payer: 59 | Admitting: Physician Assistant

## 2012-08-16 ENCOUNTER — Encounter: Payer: Self-pay | Admitting: Physician Assistant

## 2012-08-16 VITALS — BP 120/72 | HR 68 | Ht 73.0 in | Wt 197.0 lb

## 2012-08-16 DIAGNOSIS — E785 Hyperlipidemia, unspecified: Secondary | ICD-10-CM

## 2012-08-16 DIAGNOSIS — F17200 Nicotine dependence, unspecified, uncomplicated: Secondary | ICD-10-CM

## 2012-08-16 DIAGNOSIS — I10 Essential (primary) hypertension: Secondary | ICD-10-CM

## 2012-08-16 DIAGNOSIS — E119 Type 2 diabetes mellitus without complications: Secondary | ICD-10-CM

## 2012-08-16 DIAGNOSIS — Z720 Tobacco use: Secondary | ICD-10-CM

## 2012-08-16 DIAGNOSIS — I517 Cardiomegaly: Secondary | ICD-10-CM

## 2012-08-16 NOTE — Assessment & Plan Note (Signed)
Currently treated with pravastatin.

## 2012-08-16 NOTE — Assessment & Plan Note (Signed)
Pretreat with Januvia and metformin

## 2012-08-16 NOTE — Progress Notes (Signed)
Patient ID: [name], male   DOB: 10-Feb-1961, 52 y.o.   MRN: [id number] Date:  08/16/2012   ID:  [name], DOB 02-21-1961, MRN [id number]  PCP:  Oliver Barre, MD  Primary Cardiologist: Allyson Sabal    History of Present Illness: [name] is a 52 y.o. 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 He has a history of tobacco abuse hypertension, hyper and anemia. He had left heart catheterization in 10/23/2008 which revealed normal coronary arteries and normal LV function. 2-D echocardiogram completed on 02/13/2012 showed ejection fraction greater than 55% and normal left ventricular wall motion. 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 Mild to moderate left atrial tablet rotation. Right ventricular systolic pressure was 30-40 mm of mercury.   Patient presents for a six-month followup visit. Reports had worked for the weekend but is now on vacation for the next 10 days. He'll exercise that he gets his when he mows the lawn. He has lost approximately 45 pounds over the last year 2. He reports starting to smoke the "BLUE" cigarettes for the last 2 months. He reports feeling good with no complaints. He was last seen 01/15/2012 during which time as her blood pressure was elevated 162/108.  He has recently been started on hydralazine. Currently denies any nausea, vomiting, fever, chest pain, shortness of breath, orthopnea, PND, Lorsch of edema, abdominal pain, hematochezia, melena, hematuria, dizziness.  Wt Readings from Last 3 Encounters:  08/16/12 197 lb (89.359 kg)  10/21/11 196 lb 6 oz (89.075 kg)  10/17/11 196 lb 12.8 oz (89.268 kg)     Past Medical History  Diagnosis Date  . Hypertension   . Hyperlipidemia   . Diabetes mellitus     type II  . LVH (left ventricular hypertrophy)     with systolic dysfunction by echo 10/03, EF 45-50% - Dr. Elsie Lincoln  . OSA (obstructive sleep apnea)   . DIABETES MELLITUS, TYPE II 08/29/2009  . HYPERLIPIDEMIA 08/29/2009  . HYPERTENSION 01/14/2008  . SLEEP  APNEA, OBSTRUCTIVE 01/14/2008  . GERD (gastroesophageal reflux disease) 08/12/2010  . Chest pain 02/13/2012    echo - EF >55%;mod concentric L ventricular hypertrophy; atrial septum aneurysmal; patent foramen ovale suspected on color flow, LA mil/mod dilation; RV systolic pressure  . Chest pain 06/09/2007    echo - EF 45-50%; interarterial shunt; mild valvular regurgitation;   . Chest pain 06/09/2007    Myoview - EF 53%; normal perfusion all regions;EKG negative for ischemia   . Nonspecific ST-T wave electrocardiographic changes 08/11/2006    Myoview - EF 49%; normal perfusion all regions, EKG negative for ischemia  . Lightheadedness 05/10/2002    30d monitor unremarkable  . Sleep apnea 08/13/2008    AHI 1.2; sleep study 10/2007 - AHI 9.47at 15cm H2O and 31.3 at 16cm H2O    Current Outpatient Prescriptions  Medication Sig Dispense Refill  . amLODipine-benazepril (LOTREL) 10-40 MG per capsule TAKE 1 CAPSULE BY MOUTH DAILY.  90 capsule  1  . aspirin 81 MG EC tablet Take 81 mg by mouth daily.        Tery Sanfilippo Calcium ([facility] STOOL SOFTENER PO) Take 2 tablets by mouth 2 (two) times daily.       Marland Kitchen glucose blood (ONE TOUCH TEST STRIPS) test strip Test three times a day as instructed.  100 each  11  . hydrALAZINE (APRESOLINE) 25 MG tablet Take 25 mg by mouth 3 (three) times daily.      Marland Kitchen ibuprofen (ADVIL,MOTRIN) 200 MG tablet Take 400  mg by mouth every 6 (six) hours as needed. For pain      . JANUVIA 100 MG tablet TAKE 1 TABLET (100 MG TOTAL) BY MOUTH DAILY.  90 tablet  3  . Lancets (ONETOUCH ULTRASOFT) lancets TEST AS DIRECTED  100 each  11  . metFORMIN (GLUCOPHAGE) 500 MG tablet TAKE 2 TABLETS BY MOUTH TWICE DAILY.  120 tablet  11  . Nebivolol HCl (BYSTOLIC) 20 MG TABS Take 2 tablets (40 mg total) by mouth daily.  60 tablet  11  . omeprazole (PRILOSEC) 20 MG capsule Take 20 mg by mouth daily.        . pravastatin (PRAVACHOL) 40 MG tablet TAKE 1 TABLET BY MOUTH DAILY.  90 tablet  3   No  current facility-administered medications for this visit.    Allergies:    Allergies  Allergen Reactions  . Sulfa Antibiotics Hives and Swelling  . Vicodin (Hydrocodone-Acetaminophen) Itching    Social History:  The patient  reports that he has been smoking Cigarettes.  He has been smoking about 0.50 packs per day. He does not have any smokeless tobacco history on file. He reports that  drinks alcohol. He reports that he does not use illicit drugs.   ROS:  Please see the history of present illness.  All other systems reviewed and negative.   PHYSICAL EXAM: VS:  BP 120/72  Pulse 68  Ht 6\' 1"  (1.854 m)  Wt 197 lb (89.359 kg)  BMI 26 kg/m2 Well nourished, well developed, in no acute distress HEENT: Pupils are equal round react to light accommodation extraocular movements are intact.  Neck: no JVDNo cervical lymphadenopathy. Cardiac: Regular rate and rhythm without murmurs rubs or gallops. Lungs:  clear to auscultation bilaterally, no wheezing, rhonchi or rales Abd: soft, nontender, positive bowel sounds all quadrants, no hepatosplenomegaly Ext: no lower extremity edema.  2+ radial and dorsalis pedis pulses. Skin: warm and dry Neuro:  Grossly normal  EKG:  EKG shows normal sinus rhythm T-wave flattening inferiorly and laterally. Rate of 60 beats per minute     ASSESSMENT AND PLAN:  Problem List Items Addressed This Visit   HYPERTENSION - Primary     Blood pressure currently well-controlled. Hydralazine was recently added.    DIABETES MELLITUS, TYPE II     Pretreat with Januvia and metformin    HYPERLIPIDEMIA     Currently treated with pravastatin.    Tobacco abuse     Tobacco cessation was discussed. Patient started using "blue" cigarettes for the last 2 months. He does still does smoke intermittently.     Other Visit Diagnoses   LVH (left ventricular hypertrophy)        Relevant Orders       EKG 12-Lead

## 2012-08-16 NOTE — Patient Instructions (Signed)
Your physician recommends that you schedule a follow-up appointment in:6 months with Dr Berry 

## 2012-08-16 NOTE — Assessment & Plan Note (Signed)
Blood pressure currently well-controlled. Hydralazine was recently added.

## 2012-08-16 NOTE — Assessment & Plan Note (Signed)
Tobacco cessation was discussed. Patient started using "blue" cigarettes for the last 2 months. He does still does smoke intermittently.

## 2012-08-17 ENCOUNTER — Other Ambulatory Visit: Payer: Self-pay | Admitting: Internal Medicine

## 2012-09-07 ENCOUNTER — Encounter: Payer: Self-pay | Admitting: Gastroenterology

## 2012-09-09 ENCOUNTER — Encounter: Payer: Self-pay | Admitting: Gastroenterology

## 2012-09-27 ENCOUNTER — Telehealth: Payer: Self-pay | Admitting: *Deleted

## 2012-09-27 MED ORDER — HYDRALAZINE HCL 50 MG PO TABS: 50.0000 mg | ORAL_TABLET | Freq: Three times a day (TID) | ORAL | Status: DC

## 2012-09-27 NOTE — Telephone Encounter (Signed)
Returning your call. °

## 2012-09-27 NOTE — Telephone Encounter (Signed)
Hydralazine has been increased to 50mg  tid by Belenda Cruise.  [facility]

## 2012-10-26 ENCOUNTER — Ambulatory Visit (AMBULATORY_SURGERY_CENTER): Payer: 59

## 2012-10-26 VITALS — Ht 73.0 in | Wt 200.4 lb

## 2012-10-26 DIAGNOSIS — Z860100 Personal history of colon polyps, unspecified: Secondary | ICD-10-CM

## 2012-10-26 MED ORDER — NA SULFATE-K SULFATE-MG SULF 17.5-3.13-1.6 GM/177ML PO SOLN: 1.0000 | Freq: Once | ORAL | Status: DC

## 2012-10-27 ENCOUNTER — Encounter: Payer: Self-pay | Admitting: Gastroenterology

## 2012-10-31 ENCOUNTER — Other Ambulatory Visit: Payer: Self-pay | Admitting: Cardiovascular Disease

## 2012-11-01 ENCOUNTER — Other Ambulatory Visit: Payer: Self-pay | Admitting: Internal Medicine

## 2012-11-01 NOTE — Telephone Encounter (Signed)
Rx was sent to pharmacy electronically. 

## 2012-11-09 ENCOUNTER — Ambulatory Visit (AMBULATORY_SURGERY_CENTER): Payer: 59 | Admitting: Gastroenterology

## 2012-11-09 ENCOUNTER — Encounter: Payer: Self-pay | Admitting: Gastroenterology

## 2012-11-09 VITALS — BP 115/76 | HR 60 | Temp 95.9°F | Resp 20 | Ht 73.0 in | Wt 200.0 lb

## 2012-11-09 DIAGNOSIS — Z860100 Personal history of colon polyps, unspecified: Secondary | ICD-10-CM

## 2012-11-09 DIAGNOSIS — D126 Benign neoplasm of colon, unspecified: Secondary | ICD-10-CM

## 2012-11-09 MED ORDER — SODIUM CHLORIDE 0.9 % IV SOLN: 500.0000 mL | INTRAVENOUS | Status: DC

## 2012-11-09 NOTE — Progress Notes (Addendum)
Patient did not have preoperative order for IV antibiotic SSI prophylaxis. (G8918)  Patient did not experience any of the following events: a burn prior to discharge; a fall within the facility; wrong site/side/patient/procedure/implant event; or a hospital transfer or hospital admission upon discharge from the facility. (G8907)  

## 2012-11-09 NOTE — Progress Notes (Signed)
Called to room to assist during endoscopic procedure.  Patient ID and intended procedure confirmed with present staff. Received instructions for my participation in the procedure from the performing physician.  

## 2012-11-09 NOTE — Progress Notes (Signed)
Pt stable to RR 

## 2012-11-09 NOTE — Op Note (Signed)
[facility] [address] [place], [address]   COLONOSCOPY PROCEDURE REPORT  PATIENT: [name], [name]  MR#: [id number] BIRTHDATE: Oct 13, 1960 , 52  yrs. old GENDER: Male ENDOSCOPIST: Louis Meckel, MD REFERRED BY: PROCEDURE DATE:  11/09/2012 PROCEDURE:   Colonoscopy with snare polypectomy First Screening Colonoscopy - Avg.  risk and is 50 yrs.  old or older - No.  Prior Negative Screening - Now for repeat screening. N/A  History of Adenoma - Now for follow-up colonoscopy & has been > or = to 3 yrs.  Yes hx of adenoma.  Has been 3 or more years since last colonoscopy.  Polyps Removed Today? Yes. ASA CLASS:   Class II INDICATIONS:Patient's personal history of adenomatous colon polyps. Index polypectomy 2011 MEDICATIONS: MAC sedation, administered by CRNA and propofol (Diprivan) 250mg  IV  DESCRIPTION OF PROCEDURE:   After the risks benefits and alternatives of the procedure were thoroughly explained, informed consent was obtained.  A digital rectal exam revealed no abnormalities of the rectum.   The LB [id number]  endoscope was introduced through the anus and advanced to the cecum, which was identified by both the appendix and ileocecal valve. No adverse events experienced.   The quality of the prep was excellent using Suprep  The instrument was then slowly withdrawn as the colon was fully examined.      COLON FINDINGS: A sessile polyp measuring 3 mm in size was found in the descending colon.  A polypectomy was performed with a cold snare.  The resection was complete and the polyp tissue was completely retrieved.   The colon mucosa was otherwise normal. The time to cecum=2 minutes 51 seconds.  Withdrawal time=12 minutes 10 seconds.  The scope was withdrawn and the procedure completed. COMPLICATIONS: There were no complications.  ENDOSCOPIC IMPRESSION: 1.   Sessile polyp measuring 3 mm in size was found in the descending colon; polypectomy was  performed with a cold snare 2.   The colon mucosa was otherwise normal  RECOMMENDATIONS: If the polyp(s) removed today are proven to be adenomatous (pre-cancerous) polyps, you will need a repeat colonoscopy in 5 years.  Otherwise you should undergo colonoscopy in 7 years.  You will receive a letter within 1-2 weeks with the results of your biopsy as well as final recommendations.  Please call my office if you have not received a letter after 3 weeks.   eSigned:  Louis Meckel, MD 11/09/2012 9:11 AM   cc: Corwin Levins, MD

## 2012-11-09 NOTE — Progress Notes (Signed)
No egg or soy allergy. ewm No problems with past sedation. ewm 

## 2012-11-09 NOTE — Patient Instructions (Addendum)
YOU HAD AN ENDOSCOPIC PROCEDURE TODAY AT THE [facility]: Refer to the procedure report that was given to you for any specific questions about what was found during the examination.  If the procedure report does not answer your questions, please call your gastroenterologist to clarify.  If you requested that your care partner not be given the details of your procedure findings, then the procedure report has been included in a sealed envelope for you to review at your convenience later.  YOU SHOULD EXPECT: Some feelings of bloating in the abdomen. Passage of more gas than usual.  Walking can help get rid of the air that was put into your GI tract during the procedure and reduce the bloating. If you had a lower endoscopy (such as a colonoscopy or flexible sigmoidoscopy) you may notice spotting of blood in your stool or on the toilet paper. If you underwent a bowel prep for your procedure, then you may not have a normal bowel movement for a few days.  DIET: Your first meal following the procedure should be a light meal and then it is ok to progress to your normal diet.  A half-sandwich or bowl of soup is an example of a good first meal.  Heavy or fried foods are harder to digest and may make you feel nauseous or bloated.  Likewise meals heavy in dairy and vegetables can cause extra gas to form and this can also increase the bloating.  Drink plenty of fluids but you should avoid alcoholic beverages for 24 hours.  ACTIVITY: Your care partner should take you home directly after the procedure.  You should plan to take it easy, moving slowly for the rest of the day.  You can resume normal activity the day after the procedure however you should NOT DRIVE or use heavy machinery for 24 hours (because of the sedation medicines used during the test).    SYMPTOMS TO REPORT IMMEDIATELY: A gastroenterologist can be reached at any hour.  During normal business hours, 8:30 AM to 5:00 PM Monday through Friday,  call [phone].  After hours and on weekends, please call the GI answering service at [phone] who will take a message and have the physician on call contact you.   Following lower endoscopy (colonoscopy or flexible sigmoidoscopy):  Excessive amounts of blood in the stool  Significant tenderness or worsening of abdominal pains  Swelling of the abdomen that is new, acute  Fever of 100F or higher    FOLLOW UP: If any biopsies were taken you will be contacted by phone or by letter within the next 1-3 weeks.  Call your gastroenterologist if you have not heard about the biopsies in 3 weeks.  Our staff will call the home number listed on your records the next business day following your procedure to check on you and address any questions or concerns that you may have at that time regarding the information given to you following your procedure. This is a courtesy call and so if there is no answer at the home number and we have not heard from you through the emergency physician on call, we will assume that you have returned to your regular daily activities without incident.  SIGNATURES/CONFIDENTIALITY: You and/or your care partner have signed paperwork which will be entered into your electronic medical record.  These signatures attest to the fact that that the information above on your After Visit Summary has been reviewed and is understood.  Full responsibility of the confidentiality   of this discharge information lies with you and/or your care-partner.     

## 2012-11-10 ENCOUNTER — Telehealth: Payer: Self-pay | Admitting: *Deleted

## 2012-11-10 NOTE — Telephone Encounter (Signed)
  Follow up Call-  Call back number 11/09/2012  Post procedure Call Back phone  # [phone]  Permission to leave phone message Yes     Patient questions:  Do you have a fever, pain , or abdominal swelling? no Pain Score  0 *  Have you tolerated food without any problems? yes  Have you been able to return to your normal activities? yes  Do you have any questions about your discharge instructions: Diet   no Medications  no Follow up visit  no  Do you have questions or concerns about your Care? no  Actions: * If pain score is 4 or above: No action needed, pain <4.

## 2012-11-25 ENCOUNTER — Encounter: Payer: Self-pay | Admitting: Gastroenterology

## 2012-11-26 ENCOUNTER — Telehealth: Payer: Self-pay

## 2012-11-26 DIAGNOSIS — Z0000 Encounter for general adult medical examination without abnormal findings: Secondary | ICD-10-CM

## 2012-11-26 NOTE — Telephone Encounter (Signed)
CPX labs entered  

## 2012-12-03 ENCOUNTER — Ambulatory Visit: Payer: 59 | Admitting: Cardiovascular Disease

## 2012-12-07 ENCOUNTER — Other Ambulatory Visit: Payer: Self-pay | Admitting: Internal Medicine

## 2012-12-21 ENCOUNTER — Other Ambulatory Visit (INDEPENDENT_AMBULATORY_CARE_PROVIDER_SITE_OTHER): Payer: 59

## 2012-12-21 DIAGNOSIS — Z0000 Encounter for general adult medical examination without abnormal findings: Secondary | ICD-10-CM

## 2012-12-21 LAB — CBC WITH DIFFERENTIAL/PLATELET
Basophils Absolute: 0.1 10*3/uL (ref 0.0–0.1)
Eosinophils Absolute: 0.3 10*3/uL (ref 0.0–0.7)
Lymphocytes Relative: 33 % (ref 12.0–46.0)
MCHC: 33.1 g/dL (ref 30.0–36.0)
MCV: 96.1 fl (ref 78.0–100.0)
Monocytes Absolute: 1 10*3/uL (ref 0.1–1.0)
Neutrophils Relative %: 47.7 % (ref 43.0–77.0)
RDW: 13.4 % (ref 11.5–14.6)

## 2012-12-21 LAB — URINALYSIS, ROUTINE W REFLEX MICROSCOPIC
Bilirubin Urine: NEGATIVE
Leukocytes, UA: NEGATIVE
Nitrite: NEGATIVE
Specific Gravity, Urine: >=1.03 (ref 1.000–1.030)
Total Protein, Urine: NEGATIVE
Urine Glucose: NEGATIVE
pH: 5.5 (ref 5.0–8.0)

## 2012-12-21 LAB — BASIC METABOLIC PANEL
BUN: 24 mg/dL — ABNORMAL HIGH (ref 6–23)
Chloride: 104 mEq/L (ref 96–112)
Creatinine, Ser: 1.4 mg/dL (ref 0.4–1.5)
GFR: 69.16 mL/min (ref >60.00)
Potassium: 3.5 mEq/L (ref 3.5–5.1)

## 2012-12-21 LAB — HEPATIC FUNCTION PANEL
ALT: 16 U/L (ref 0–53)
Bilirubin, Direct: 0.1 mg/dL (ref 0.0–0.3)
Total Bilirubin: 0.6 mg/dL (ref 0.3–1.2)

## 2012-12-21 LAB — LIPID PANEL
Cholesterol: 146 mg/dL (ref 0–200)
HDL: 47.1 mg/dL (ref >39.00)

## 2012-12-21 LAB — TSH: TSH: 0.69 u[IU]/mL (ref 0.35–5.50)

## 2012-12-22 ENCOUNTER — Ambulatory Visit: Payer: 59 | Admitting: Cardiovascular Disease

## 2012-12-22 LAB — PSA: PSA: 0.19 ng/mL (ref 0.10–4.00)

## 2012-12-24 ENCOUNTER — Ambulatory Visit (INDEPENDENT_AMBULATORY_CARE_PROVIDER_SITE_OTHER): Payer: 59 | Admitting: Cardiovascular Disease

## 2012-12-24 ENCOUNTER — Encounter: Payer: Self-pay | Admitting: Cardiovascular Disease

## 2012-12-24 VITALS — BP 110/80 | HR 65 | Ht 73.0 in | Wt 193.0 lb

## 2012-12-24 DIAGNOSIS — E785 Hyperlipidemia, unspecified: Secondary | ICD-10-CM

## 2012-12-24 DIAGNOSIS — E119 Type 2 diabetes mellitus without complications: Secondary | ICD-10-CM

## 2012-12-24 DIAGNOSIS — I10 Essential (primary) hypertension: Secondary | ICD-10-CM

## 2012-12-24 NOTE — Assessment & Plan Note (Signed)
Well-controlled on current medications 

## 2012-12-24 NOTE — Assessment & Plan Note (Signed)
On statin therapy with recent lipid profile performed 12/21/12 revealing a total cholesterol of 146, LDL of 80 and HDL 47

## 2012-12-24 NOTE — Patient Instructions (Addendum)
Your physician wants you to follow-up in: 1 year with Dr Berry. You will receive a reminder letter in the mail two months in advance. If you don't receive a letter, please call our office to schedule the follow-up appointment.  

## 2012-12-24 NOTE — Progress Notes (Signed)
12/24/2012 [name]   06-Apr-1960  [id number]  Primary Physician Oliver Barre, MD Primary Cardiologist: Runell Gess MD [name]   HPI:  The patient is a 52 year old, mildly overweight, single [place] American male, father of 1, grandfather to 2 grandchildren who works as a Naval architect for [employer]. I saw him a year ago. His risk factors include tobacco abuse, hypertension and dyslipidemia. I catheterized him radially October 23, 2008, revealing normal coronary arteries and normal LV function. I felt his chest pain symptoms were related to reflux. Dr. Jonny Ruiz has been adjusting antihypertensive medications. He is aware of salt intake. He does have significant LVH on his EKG with a past echo performed 4-1/2 years ago which showed an EF of 45% to 50% with mild global hypokinesia.since I saw him a year ago he is completely asymptomatic. His blood pressure is in the much better control and the addition of hydralazine.    Current Outpatient Prescriptions  Medication Sig Dispense Refill  . amLODipine-benazepril (LOTREL) 10-40 MG per capsule TAKE 1 CAPSULE BY MOUTH DAILY.  90 capsule  1  . aspirin 81 MG EC tablet Take 81 mg by mouth daily.        Marland Kitchen BYSTOLIC 20 MG TABS TAKE 2 TABLETS BY MOUTH EVERY DAY  60 tablet  0  . chlorthalidone (HYGROTON) 25 MG tablet TAKE 1 TABLET BY MOUTH EVERY MORNING  30 tablet  9  . Docusate Calcium ([employer] STOOL SOFTENER PO) Take 2 tablets by mouth 2 (two) times daily.       Marland Kitchen glucose blood (ONE TOUCH TEST STRIPS) test strip Test three times a day as instructed.  100 each  11  . hydrALAZINE (APRESOLINE) 50 MG tablet Take 1 tablet (50 mg total) by mouth 3 (three) times daily.  270 tablet  3  . ibuprofen (ADVIL,MOTRIN) 200 MG tablet Take 400 mg by mouth every 6 (six) hours as needed. For pain      . JANUVIA 100 MG tablet TAKE 1 TABLET (100 MG TOTAL) BY MOUTH DAILY.  90 tablet  3  . Lancets (ONETOUCH ULTRASOFT) lancets TEST AS DIRECTED  100 each  11   . metFORMIN (GLUCOPHAGE) 500 MG tablet TAKE 2 TABLETS BY MOUTH TWICE DAILY.  120 tablet  11  . omeprazole (PRILOSEC) 20 MG capsule Take 20 mg by mouth daily.        . pravastatin (PRAVACHOL) 40 MG tablet TAKE 1 TABLET BY MOUTH DAILY.  90 tablet  3   No current facility-administered medications for this visit.    Allergies  Allergen Reactions  . Sulfa Antibiotics Hives and Swelling  . Vicodin [Hydrocodone-Acetaminophen] Itching    History   Social History  . Marital Status: Single    Spouse Name: N/A    Number of Children: 1  . Years of Education: N/A   Occupational History  . work as truck Copywriter, [employer]   Social History Main Topics  . Smoking status: Current Every Day Smoker -- 0.50 packs/day    Types: Cigarettes  . Smokeless tobacco: Never Used  . Alcohol Use: 1.5 oz/week    3 drink(s) per week     Comment: occaisionally  . Drug Use: No  . Sexual Activity: Not on file   Other Topics Concern  . Not on file   Social History Narrative  . No narrative on file     Review of Systems: General: negative for chills, fever, night sweats or weight changes.  Cardiovascular: negative  for chest pain, dyspnea on exertion, edema, orthopnea, palpitations, paroxysmal nocturnal dyspnea or shortness of breath Dermatological: negative for rash Respiratory: negative for cough or wheezing Urologic: negative for hematuria Abdominal: negative for nausea, vomiting, diarrhea, bright red blood per rectum, melena, or hematemesis Neurologic: negative for visual changes, syncope, or dizziness All other systems reviewed and are otherwise negative except as noted above.    Blood pressure 110/80, pulse 65, height 6\' 1"  (1.854 m), weight 193 lb (87.544 kg).  General appearance: alert and no distress Neck: no adenopathy, no carotid bruit, no JVD, supple, symmetrical, trachea midline and thyroid not enlarged, symmetric, no tenderness/mass/nodules Lungs: clear to auscultation  bilaterally Heart: regular rate and rhythm, S1, S2 normal, no murmur, click, rub or gallop Extremities: extremities normal, atraumatic, no cyanosis or edema  EKG normal sinus rhythm at 65 with borderline LVH  ASSESSMENT AND PLAN:   HYPERTENSION Well-controlled on current medications  HYPERLIPIDEMIA On statin therapy with recent lipid profile performed 12/21/12 revealing a total cholesterol of 146, LDL of 80 and HDL 47      Runell Gess MD [facility], [facility] 12/24/2012 4:38 PM

## 2012-12-29 ENCOUNTER — Encounter: Payer: Self-pay | Admitting: Internal Medicine

## 2012-12-29 ENCOUNTER — Ambulatory Visit (INDEPENDENT_AMBULATORY_CARE_PROVIDER_SITE_OTHER): Payer: 59 | Admitting: Internal Medicine

## 2012-12-29 ENCOUNTER — Other Ambulatory Visit (INDEPENDENT_AMBULATORY_CARE_PROVIDER_SITE_OTHER): Payer: 59

## 2012-12-29 VITALS — BP 120/70 | HR 69 | Temp 97.6°F | Ht 73.0 in | Wt 195.4 lb

## 2012-12-29 DIAGNOSIS — Z720 Tobacco use: Secondary | ICD-10-CM

## 2012-12-29 DIAGNOSIS — Z0000 Encounter for general adult medical examination without abnormal findings: Secondary | ICD-10-CM

## 2012-12-29 DIAGNOSIS — E119 Type 2 diabetes mellitus without complications: Secondary | ICD-10-CM

## 2012-12-29 DIAGNOSIS — F17200 Nicotine dependence, unspecified, uncomplicated: Secondary | ICD-10-CM

## 2012-12-29 DIAGNOSIS — IMO0001 Reserved for inherently not codable concepts without codable children: Secondary | ICD-10-CM

## 2012-12-29 LAB — HEMOGLOBIN A1C: Hgb A1c MFr Bld: 6.1 % (ref 4.6–6.5)

## 2012-12-29 NOTE — Assessment & Plan Note (Signed)
Urged to stop 

## 2012-12-29 NOTE — Assessment & Plan Note (Signed)

## 2012-12-29 NOTE — Assessment & Plan Note (Signed)
unfort a1c not done, will ask pt to do this today, last was excellent may 2013 at 5.9, cont same tx, for f/u labs 6 mo

## 2012-12-29 NOTE — Patient Instructions (Signed)
There is no change in treatment today Please continue all other medications as before Please have the pharmacy call with any other refills you may need. 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 You are otherwise up to date with prevention measures today. Please go to the LAB in the Basement (turn left off the elevator) for the tests to be done today - JUST the A1c today since this was not done You will be contacted by phone if any changes need to be made immediately.  Otherwise, you will receive a letter about your results with an explanation, but please check with MyChart first.  Please remember to sign up for My Chart if you have not done so, as this will be important to you in the future with finding out test results, communicating by private email, and scheduling acute appointments online when needed.  Please return in 6 months, or sooner if needed, with Lab testing done 3-5 days before

## 2012-12-29 NOTE — Progress Notes (Signed)
Subjective:    Patient ID: [name], male    DOB: November 23, 1960, 52 y.o.   MRN: [id number]  HPI  Here for wellness and f/u;  Overall doing ok;  Pt denies CP, worsening SOB, DOE, wheezing, orthopnea, PND, worsening LE edema, palpitations, dizziness or syncope.  Pt denies neurological change such as new headache, facial or extremity weakness.  Pt denies polydipsia, polyuria, or low sugar symptoms. Pt states overall good compliance with treatment and medications, good tolerability, and has been trying to follow lower cholesterol diet.  Pt denies worsening depressive symptoms, suicidal ideation or panic. No fever, night sweats, wt loss, loss of appetite, or other constitutional symptoms.  Pt states good ability with ADL's, has low fall risk, home safety reviewed and adequate, no other significant changes in hearing or vision, and only occasionally active with exercise.  No acute complaints.  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  Saw cardiology recently, doing ok per Dr Allyson Sabal Past Medical History  Diagnosis Date  . Hypertension   . Hyperlipidemia   . Diabetes mellitus     type II  . LVH (left ventricular hypertrophy)     with systolic dysfunction by echo 10/03, EF 45-50% - Dr. Elsie Lincoln  . OSA (obstructive sleep apnea)   . DIABETES MELLITUS, TYPE II 08/29/2009  . HYPERLIPIDEMIA 08/29/2009  . HYPERTENSION 01/14/2008  . SLEEP APNEA, OBSTRUCTIVE 01/14/2008  . GERD (gastroesophageal reflux disease) 08/12/2010  . Chest pain 02/13/2012    echo - EF >55%;mod concentric L ventricular hypertrophy; atrial septum aneurysmal; patent foramen ovale suspected on color flow, LA mil/mod dilation; RV systolic pressure  . Chest pain 06/09/2007    echo - EF 45-50%; interarterial shunt; mild valvular regurgitation;   . Chest pain 06/09/2007    Myoview - EF 53%; normal perfusion all regions;EKG negative for ischemia   . Nonspecific ST-T wave electrocardiographic changes  08/11/2006    Myoview - EF 49%; normal perfusion all regions, EKG negative for ischemia  . Lightheadedness 05/10/2002    30d monitor unremarkable  . Sleep apnea 08/13/2008    AHI 1.2; sleep study 10/2007 - AHI 9.47at 15cm H2O and 31.3 at 16cm H2O  . Hemorrhoids   . History of anal fissures    Past Surgical History  Procedure Laterality Date  . Anal sphincterotomy  08/20/11  . Cardiac catheterization  10/23/2008    normal coronary arteries and LV function  . Colonoscopy    . Polypectomy      reports that he has been smoking Cigarettes.  He has been smoking about 0.50 packs per day. He has never used smokeless tobacco. He reports that he drinks about 1.5 ounces of alcohol per week. He reports that he does not use illicit drugs. family history includes Heart disease in his father; Hypertension in his mother. There is no history of Colon cancer. Allergies  Allergen Reactions  . Sulfa Antibiotics Hives and Swelling  . Vicodin [Hydrocodone-Acetaminophen] Itching   Current Outpatient Prescriptions on File Prior to Visit  Medication Sig Dispense Refill  . amLODipine-benazepril (LOTREL) 10-40 MG per capsule TAKE 1 CAPSULE BY MOUTH DAILY.  90 capsule  1  . aspirin 81 MG EC tablet Take 81 mg by mouth daily.        Marland Kitchen BYSTOLIC 20 MG TABS TAKE 2 TABLETS BY MOUTH EVERY DAY  60 tablet  0  . chlorthalidone (HYGROTON) 25 MG tablet TAKE 1 TABLET BY MOUTH EVERY MORNING  30  tablet  9  . Docusate Calcium ([employer]S STOOL SOFTENER PO) Take 2 tablets by mouth 2 (two) times daily.       Marland Kitchen glucose blood (ONE TOUCH TEST STRIPS) test strip Test three times a day as instructed.  100 each  11  . hydrALAZINE (APRESOLINE) 50 MG tablet Take 1 tablet (50 mg total) by mouth 3 (three) times daily.  270 tablet  3  . ibuprofen (ADVIL,MOTRIN) 200 MG tablet Take 400 mg by mouth every 6 (six) hours as needed. For pain      . JANUVIA 100 MG tablet TAKE 1 TABLET (100 MG TOTAL) BY MOUTH DAILY.  90 tablet  3  . Lancets (ONETOUCH  ULTRASOFT) lancets TEST AS DIRECTED  100 each  11  . metFORMIN (GLUCOPHAGE) 500 MG tablet TAKE 2 TABLETS BY MOUTH TWICE DAILY.  120 tablet  11  . omeprazole (PRILOSEC) 20 MG capsule Take 20 mg by mouth daily.        . pravastatin (PRAVACHOL) 40 MG tablet TAKE 1 TABLET BY MOUTH DAILY.  90 tablet  3   No current facility-administered medications on file prior to visit.   Review of Systems Constitutional: Negative for diaphoresis, activity change, appetite change or unexpected weight change.  HENT: Negative for hearing loss, ear pain, facial swelling, mouth sores and neck stiffness.   Eyes: Negative for pain, redness and visual disturbance.  Respiratory: Negative for shortness of breath and wheezing.   Cardiovascular: Negative for chest pain and palpitations.  Gastrointestinal: Negative for diarrhea, blood in stool, abdominal distention or other pain Genitourinary: Negative for hematuria, flank pain or change in urine volume.  Musculoskeletal: Negative for myalgias and joint swelling.  Skin: Negative for color change and wound.  Neurological: Negative for syncope and numbness. other than noted Hematological: Negative for adenopathy.  Psychiatric/Behavioral: Negative for hallucinations, self-injury, decreased concentration and agitation.      Objective:   Physical Exam BP 120/70  Pulse 69  Temp(Src) 97.6 F (36.4 C) (Oral)  Ht 6\' 1"  (1.854 m)  Wt 195 lb 6 oz (88.622 kg)  BMI 25.78 kg/m2  SpO2 97% VS noted,  Constitutional: Pt is oriented to person, place, and time. Appears well-developed and well-nourished.  Head: Normocephalic and atraumatic.  Right Ear: External ear normal.  Left Ear: External ear normal.  Nose: Nose normal.  Mouth/Throat: Oropharynx is clear and moist.  Eyes: Conjunctivae and EOM are normal. Pupils are equal, round, and reactive to light.  Neck: Normal range of motion. Neck supple. No JVD present. No tracheal deviation present.  Cardiovascular: Normal rate,  regular rhythm, normal heart sounds and intact distal pulses.   Pulmonary/Chest: Effort normal and breath sounds normal.  Abdominal: Soft. Bowel sounds are normal. There is no tenderness. No HSM  Musculoskeletal: Normal range of motion. Exhibits no edema.  Lymphadenopathy:  Has no cervical adenopathy.  Neurological: Pt is alert and oriented to person, place, and time. Pt has normal reflexes. No cranial nerve deficit.  Skin: Skin is warm and dry. No rash noted.  Psychiatric:  Has  normal mood and affect. Behavior is normal.      Assessment & Plan:

## 2012-12-30 ENCOUNTER — Other Ambulatory Visit: Payer: Self-pay | Admitting: Internal Medicine

## 2012-12-31 ENCOUNTER — Other Ambulatory Visit: Payer: Self-pay | Admitting: Internal Medicine

## 2013-01-12 ENCOUNTER — Other Ambulatory Visit: Payer: Self-pay | Admitting: Internal Medicine

## 2013-01-25 ENCOUNTER — Other Ambulatory Visit: Payer: Self-pay | Admitting: Internal Medicine

## 2013-02-04 ENCOUNTER — Other Ambulatory Visit: Payer: Self-pay | Admitting: Cardiovascular Disease

## 2013-02-04 NOTE — Telephone Encounter (Signed)
Rx was sent to pharmacy electronically. 

## 2013-02-11 ENCOUNTER — Other Ambulatory Visit: Payer: Self-pay | Admitting: Cardiovascular Disease

## 2013-02-16 ENCOUNTER — Other Ambulatory Visit: Payer: Self-pay | Admitting: Cardiovascular Disease

## 2013-02-16 NOTE — Telephone Encounter (Signed)
Rx was sent to pharmacy electronically. 

## 2013-02-25 ENCOUNTER — Other Ambulatory Visit: Payer: Self-pay | Admitting: Cardiovascular Disease

## 2013-02-25 ENCOUNTER — Other Ambulatory Visit: Payer: Self-pay | Admitting: Internal Medicine

## 2013-02-28 NOTE — Telephone Encounter (Signed)
Rx was sent to pharmacy electronically. 

## 2013-03-23 ENCOUNTER — Other Ambulatory Visit: Payer: Self-pay | Admitting: Internal Medicine

## 2013-03-28 ENCOUNTER — Other Ambulatory Visit: Payer: Self-pay | Admitting: *Deleted

## 2013-03-28 MED ORDER — NEBIVOLOL HCL 10 MG PO TABS: 10.0000 mg | ORAL_TABLET | Freq: Every day | ORAL | Status: DC

## 2013-05-04 ENCOUNTER — Other Ambulatory Visit: Payer: Self-pay | Admitting: Internal Medicine

## 2013-05-05 ENCOUNTER — Other Ambulatory Visit: Payer: Self-pay | Admitting: Internal Medicine

## 2013-06-21 ENCOUNTER — Other Ambulatory Visit (INDEPENDENT_AMBULATORY_CARE_PROVIDER_SITE_OTHER): Payer: 59

## 2013-06-21 DIAGNOSIS — E1165 Type 2 diabetes mellitus with hyperglycemia: Principal | ICD-10-CM

## 2013-06-21 DIAGNOSIS — IMO0001 Reserved for inherently not codable concepts without codable children: Secondary | ICD-10-CM

## 2013-06-21 LAB — LIPID PANEL
CHOL/HDL RATIO: 3
Cholesterol: 124 mg/dL (ref 0–200)
HDL: 47.7 mg/dL (ref >39.00)
LDL Cholesterol: 50 mg/dL (ref 0–99)
Triglycerides: 133 mg/dL (ref 0.0–149.0)
VLDL: 26.6 mg/dL (ref 0.0–40.0)

## 2013-06-21 LAB — BASIC METABOLIC PANEL
BUN: 19 mg/dL (ref 6–23)
CHLORIDE: 101 meq/L (ref 96–112)
CO2: 30 mEq/L (ref 19–32)
Calcium: 9.4 mg/dL (ref 8.4–10.5)
Creatinine, Ser: 1.3 mg/dL (ref 0.4–1.5)
GFR: 73.31 mL/min (ref >60.00)
Glucose, Bld: 99 mg/dL (ref 70–99)
POTASSIUM: 3.3 meq/L — AB (ref 3.5–5.1)
Sodium: 139 mEq/L (ref 135–145)

## 2013-06-21 LAB — HEPATIC FUNCTION PANEL
ALK PHOS: 46 U/L (ref 39–117)
ALT: 19 U/L (ref 0–53)
AST: 23 U/L (ref 0–37)
Albumin: 4.4 g/dL (ref 3.5–5.2)
BILIRUBIN DIRECT: 0.1 mg/dL (ref 0.0–0.3)
BILIRUBIN TOTAL: 0.6 mg/dL (ref 0.3–1.2)
Total Protein: 7 g/dL (ref 6.0–8.3)

## 2013-06-21 LAB — HEMOGLOBIN A1C: Hgb A1c MFr Bld: 6 % (ref 4.6–6.5)

## 2013-06-29 ENCOUNTER — Encounter: Payer: Self-pay | Admitting: Internal Medicine

## 2013-06-29 ENCOUNTER — Ambulatory Visit (INDEPENDENT_AMBULATORY_CARE_PROVIDER_SITE_OTHER): Payer: 59 | Admitting: Internal Medicine

## 2013-06-29 VITALS — BP 102/70 | HR 76 | Temp 98.9°F | Ht 73.0 in | Wt 195.2 lb

## 2013-06-29 DIAGNOSIS — I10 Essential (primary) hypertension: Secondary | ICD-10-CM

## 2013-06-29 DIAGNOSIS — IMO0001 Reserved for inherently not codable concepts without codable children: Secondary | ICD-10-CM

## 2013-06-29 DIAGNOSIS — E1165 Type 2 diabetes mellitus with hyperglycemia: Secondary | ICD-10-CM

## 2013-06-29 DIAGNOSIS — E785 Hyperlipidemia, unspecified: Secondary | ICD-10-CM

## 2013-06-29 DIAGNOSIS — E119 Type 2 diabetes mellitus without complications: Secondary | ICD-10-CM

## 2013-06-29 DIAGNOSIS — Z0000 Encounter for general adult medical examination without abnormal findings: Secondary | ICD-10-CM

## 2013-06-29 MED ORDER — SITAGLIPTIN PHOSPHATE 100 MG PO TABS: 100.0000 mg | ORAL_TABLET | Freq: Every day | ORAL | Status: DC

## 2013-06-29 MED ORDER — METFORMIN HCL 500 MG PO TABS: ORAL_TABLET | ORAL | Status: DC

## 2013-06-29 MED ORDER — PRAVASTATIN SODIUM 40 MG PO TABS: 40.0000 mg | ORAL_TABLET | Freq: Every day | ORAL | Status: DC

## 2013-06-29 MED ORDER — AMLODIPINE BESY-BENAZEPRIL HCL 10-40 MG PO CAPS: 1.0000 | ORAL_CAPSULE | Freq: Every day | ORAL | Status: DC

## 2013-06-29 MED ORDER — OMEPRAZOLE 20 MG PO CPDR: 20.0000 mg | DELAYED_RELEASE_CAPSULE | Freq: Every day | ORAL | Status: DC

## 2013-06-29 NOTE — Progress Notes (Signed)
Pre visit review using our clinic review tool, if applicable. No additional management support is needed unless otherwise documented below in the visit note. 

## 2013-06-29 NOTE — Progress Notes (Signed)
Subjective:    Patient ID: [name], male    DOB: Oct 10, 1960, 53 y.o.   MRN: [id number]  HPI  Here to f/u; overall doing ok,  Pt denies chest pain, increased sob or doe, wheezing, orthopnea, PND, increased LE swelling, palpitations, dizziness or syncope.  Pt denies polydipsia, polyuria, or low sugar symptoms such as weakness or confusion improved with po intake.  Pt denies new neurological symptoms such as new headache, or facial or extremity weakness or numbness.   Pt states overall good compliance with meds, has been trying to follow lower cholesterol, diabetic diet, with wt overall stable,  but little exercise however. No current complaints. Denies worsening depressive symptoms, suicidal ideation, or panic Past Medical History  Diagnosis Date  . Hypertension   . Hyperlipidemia   . Diabetes mellitus     type II  . LVH (left ventricular hypertrophy)     with systolic dysfunction by echo 10/03, EF 45-50% - Dr. Melvern Banker  . OSA (obstructive sleep apnea)   . DIABETES MELLITUS, TYPE II 08/29/2009  . HYPERLIPIDEMIA 08/29/2009  . HYPERTENSION 01/14/2008  . SLEEP APNEA, OBSTRUCTIVE 01/14/2008  . GERD (gastroesophageal reflux disease) 08/12/2010  . Chest pain 02/13/2012    echo - EF >55%;mod concentric L ventricular hypertrophy; atrial septum aneurysmal; patent foramen ovale suspected on color flow, LA mil/mod dilation; RV systolic pressure 24MWNU  . Chest pain 06/09/2007    echo - EF 45-50%; interarterial shunt; mild valvular regurgitation;   . Chest pain 06/09/2007    Myoview - EF 53%; normal perfusion all regions;EKG negative for ischemia   . Nonspecific ST-T wave electrocardiographic changes 08/11/2006    Myoview - EF 49%; normal perfusion all regions, EKG negative for ischemia  . Lightheadedness 05/10/2002    30d monitor unremarkable  . Sleep apnea 08/13/2008    AHI 1.2; sleep study 10/2007 - AHI 9.47at 15cm H2O and 31.3 at 16cm H2O  . Hemorrhoids   . History of anal fissures    Past  Surgical History  Procedure Laterality Date  . Anal sphincterotomy  08/20/11  . Cardiac catheterization  10/23/2008    normal coronary arteries and LV function  . Colonoscopy    . Polypectomy      reports that he has been smoking Cigarettes.  He has been smoking about 0.50 packs per day. He has never used smokeless tobacco. He reports that he drinks about 1.5 ounces of alcohol per week. He reports that he does not use illicit drugs. family history includes Heart disease in his father; Hypertension in his mother. There is no history of Colon cancer. Allergies  Allergen Reactions  . Sulfa Antibiotics Hives and Swelling  . Vicodin [Hydrocodone-Acetaminophen] Itching   Current Outpatient Prescriptions on File Prior to Visit  Medication Sig Dispense Refill  . aspirin 81 MG EC tablet Take 81 mg by mouth daily.        Marland Kitchen BYSTOLIC 20 MG TABS TAKE 2 TABLETS BY MOUTH EVERY DAY  60 tablet  10  . chlorthalidone (HYGROTON) 25 MG tablet TAKE 1 TABLET BY MOUTH EVERY MORNING  30 tablet  5  . Docusate Calcium ([employer]S STOOL SOFTENER PO) Take 2 tablets by mouth 2 (two) times daily.       . hydrALAZINE (APRESOLINE) 50 MG tablet Take 1 tablet (50 mg total) by mouth 3 (three) times daily.  270 tablet  3  . ibuprofen (ADVIL,MOTRIN) 200 MG tablet Take 400 mg by mouth every 6 (six) hours as needed. For pain      .  Lancets (ONETOUCH ULTRASOFT) lancets TEST AS DIRECTED  100 each  0  . nebivolol (BYSTOLIC) 10 MG tablet Take 1 tablet (10 mg total) by mouth daily. Take 4 tablets (40mg  total) by mouth daily.  120 tablet  11  . ONE TOUCH ULTRA TEST test strip TEST THREE TIMES A DAY AS INSTRUCTED.  100 each  1   No current facility-administered medications on file prior to visit.   Review of Systems  Constitutional: Negative for unexpected weight change, or unusual diaphoresis  HENT: Negative for tinnitus.   Eyes: Negative for photophobia and visual disturbance.  Respiratory: Negative for choking and stridor.     Gastrointestinal: Negative for vomiting and blood in stool.  Genitourinary: Negative for hematuria and decreased urine volume.  Musculoskeletal: Negative for acute joint swelling Skin: Negative for color change and wound.  Neurological: Negative for tremors and numbness other than noted  Psychiatric/Behavioral: Negative for decreased concentration or  hyperactivity.       Objective:   Physical Exam BP 102/70  Pulse 76  Temp(Src) 98.9 F (37.2 C) (Oral)  Ht 6\' 1"  (1.854 m)  Wt 195 lb 4 oz (88.565 kg)  BMI 25.77 kg/m2  SpO2 96% VS noted,  Constitutional: Pt appears well-developed and well-nourished.  HENT: Head: NCAT.  Right Ear: External ear normal.  Left Ear: External ear normal.  Eyes: Conjunctivae and EOM are normal. Pupils are equal, round, and reactive to light.  Neck: Normal range of motion. Neck supple.  Cardiovascular: Normal rate and regular rhythm.   Pulmonary/Chest: Effort normal and breath sounds normal.  Abd:  Soft, NT, non-distended, + BS Neurological: Pt is alert. Not confused  Skin: Skin is warm. No erythema.  Psychiatric: Pt behavior is normal. Thought content normal. not depressed affect    Assessment & Plan:

## 2013-06-29 NOTE — Patient Instructions (Signed)
Please continue all other medications as before, and refills have been done if requested. Please have the pharmacy call with any other refills you may need.  Please continue your efforts at being more active, low cholesterol diet, and weight control.  Please return in 6 months, or sooner if needed, with Lab testing done 3-5 days before  

## 2013-07-01 NOTE — Assessment & Plan Note (Signed)
stable overall by history and exam, recent data reviewed with pt, and pt to continue medical treatment as before,  to f/u any worsening symptoms or concerns BP Readings from Last 3 Encounters:  06/29/13 102/70  12/29/12 120/70  12/24/12 110/80

## 2013-07-01 NOTE — Assessment & Plan Note (Signed)
stable overall by history and exam, recent data reviewed with pt, and pt to continue medical treatment as before,  to f/u any worsening symptoms or concerns Lab Results  Component Value Date   HGBA1C 6.0 06/21/2013    

## 2013-07-01 NOTE — Assessment & Plan Note (Signed)
stable overall by history and exam, recent data reviewed with pt, and pt to continue medical treatment as before,  to f/u any worsening symptoms or concerns Lab Results  Component Value Date   LDLCALC 50 06/21/2013

## 2013-07-22 ENCOUNTER — Telehealth: Payer: Self-pay

## 2013-07-22 NOTE — Telephone Encounter (Signed)
Relevant patient education mailed to patient.  

## 2013-08-23 ENCOUNTER — Encounter: Payer: Self-pay | Admitting: Internal Medicine

## 2013-08-23 ENCOUNTER — Ambulatory Visit (INDEPENDENT_AMBULATORY_CARE_PROVIDER_SITE_OTHER): Payer: 59 | Admitting: Internal Medicine

## 2013-08-23 ENCOUNTER — Telehealth: Payer: Self-pay | Admitting: Internal Medicine

## 2013-08-23 VITALS — BP 120/80 | HR 65 | Temp 98.4°F | Ht 73.0 in | Wt 189.0 lb

## 2013-08-23 DIAGNOSIS — W57XXXA Bitten or stung by nonvenomous insect and other nonvenomous arthropods, initial encounter: Secondary | ICD-10-CM

## 2013-08-23 DIAGNOSIS — IMO0001 Reserved for inherently not codable concepts without codable children: Secondary | ICD-10-CM

## 2013-08-23 DIAGNOSIS — E119 Type 2 diabetes mellitus without complications: Secondary | ICD-10-CM

## 2013-08-23 DIAGNOSIS — I10 Essential (primary) hypertension: Secondary | ICD-10-CM

## 2013-08-23 MED ORDER — DOXYCYCLINE HYCLATE 100 MG PO TABS: 100.0000 mg | ORAL_TABLET | Freq: Two times a day (BID) | ORAL | Status: DC

## 2013-08-23 NOTE — Patient Instructions (Signed)
Please take all new medication as prescribed  Please continue all other medications as before, and refills have been done if requested.  Please have the pharmacy call with any other refills you may need.   

## 2013-08-23 NOTE — Telephone Encounter (Signed)
Patient Information:  Caller Name: Makayla  Phone: [phone]  Patient: [name], [name]  Gender: Male  DOB: [birth year]  Age: 53 Years  PCP: Cathlean Cower (Adults only)  Office Follow Up:  Does the office need to follow up with this patient?: No  Instructions For The Office: N/A   Symptoms  Reason For Call & Symptoms: Pt reports he had tick bite 08/20/13, pt states he removed the tick and has a red knot at the site.  Pt request an appt today if possible.  Reviewed Health History In EMR: Yes  Reviewed Medications In EMR: Yes  Reviewed Allergies In EMR: Yes  Reviewed Surgeries / Procedures: Yes  Date of Onset of Symptoms: 08/20/2013  Guideline(s) Used:  Tick Bite  Disposition Per Guideline:   See Today in Office  Reason For Disposition Reached:   Patient wants to be seen  Advice Given:  Antibiotic Ointment:  Wash the wound and your hands with soap and water after removal to prevent catching any tick disease. Apply an over-the-counter antibiotic ointment (e.g., bacitracin) to the bite once.  Expected Course:  Tick bites normally do not itch or hurt. That is why they often go unnoticed.  Call Back If:  Fever or rash occur in the next 2 weeks  Bite begins to look infected  You become worse.  Patient Will Follow Care Advice:  YES  Appointment Scheduled:  08/23/2013 18:15:00 Appointment Scheduled Provider:  Cathlean Cower (Adults only)

## 2013-08-23 NOTE — Assessment & Plan Note (Signed)
Mild to mod, for antibx course,  to f/u any worsening symptoms or concerns 

## 2013-08-23 NOTE — Progress Notes (Signed)
Pre visit review using our clinic review tool, if applicable. No additional management support is needed unless otherwise documented below in the visit note. 

## 2013-08-23 NOTE — Assessment & Plan Note (Signed)
stable overall by history and exam, recent data reviewed with pt, and pt to continue medical treatment as before,  to f/u any worsening symptoms or concerns Lab Results  Component Value Date   HGBA1C 6.0 06/21/2013

## 2013-08-23 NOTE — Assessment & Plan Note (Signed)
stable overall by history and exam, recent data reviewed with pt, and pt to continue medical treatment as before,  to f/u any worsening symptoms or concerns / BP Readings from Last 3 Encounters:  08/23/13 120/80  06/29/13 102/70  12/29/12 120/70

## 2013-08-23 NOTE — Progress Notes (Signed)
Subjective:    Patient ID: [name], male    DOB: May 25, 1960, 53 y.o.   MRN: [id number]  HPI Here with acute onset red/tender/swelling to right lateral penile shaft x 2 days after mowing grass, later found tick and removed.  Pt denies chest pain, increased sob or doe, wheezing, orthopnea, PND, increased LE swelling, palpitations, dizziness or syncope.  Pt denies new neurological symptoms such as new headache, or facial or extremity weakness or numbness   Pt denies polydipsia, polyuria, Past Medical History  Diagnosis Date  . Hypertension   . Hyperlipidemia   . Diabetes mellitus     type II  . LVH (left ventricular hypertrophy)     with systolic dysfunction by echo 10/03, EF 45-50% - Dr. Melvern Banker  . OSA (obstructive sleep apnea)   . DIABETES MELLITUS, TYPE II 08/29/2009  . HYPERLIPIDEMIA 08/29/2009  . HYPERTENSION 01/14/2008  . SLEEP APNEA, OBSTRUCTIVE 01/14/2008  . GERD (gastroesophageal reflux disease) 08/12/2010  . Chest pain 02/13/2012    echo - EF >55%;mod concentric L ventricular hypertrophy; atrial septum aneurysmal; patent foramen ovale suspected on color flow, LA mil/mod dilation; RV systolic pressure [id number]  . Chest pain 06/09/2007    echo - EF 45-50%; interarterial shunt; mild valvular regurgitation;   . Chest pain 06/09/2007    Myoview - EF 53%; normal perfusion all regions;EKG negative for ischemia   . Nonspecific ST-T wave electrocardiographic changes 08/11/2006    Myoview - EF 49%; normal perfusion all regions, EKG negative for ischemia  . Lightheadedness 05/10/2002    30d monitor unremarkable  . Sleep apnea 08/13/2008    AHI 1.2; sleep study 10/2007 - AHI 9.47at 15cm H2O and 31.3 at 16cm H2O  . Hemorrhoids   . History of anal fissures    Past Surgical History  Procedure Laterality Date  . Anal sphincterotomy  08/20/11  . Cardiac catheterization  10/23/2008    normal coronary arteries and LV function  . Colonoscopy    . Polypectomy      reports that he has been  smoking Cigarettes.  He has been smoking about 0.50 packs per day. He has never used smokeless tobacco. He reports that he drinks about 1.5 ounces of alcohol per week. He reports that he does not use illicit drugs. family history includes Heart disease in his father; Hypertension in his mother. There is no history of Colon cancer. Allergies  Allergen Reactions  . Sulfa Antibiotics Hives and Swelling  . Vicodin [Hydrocodone-Acetaminophen] Itching   Current Outpatient Prescriptions on File Prior to Visit  Medication Sig Dispense Refill  . amLODipine-benazepril (LOTREL) 10-40 MG per capsule Take 1 capsule by mouth daily.  90 capsule  3  . aspirin 81 MG EC tablet Take 81 mg by mouth daily.        Marland Kitchen BYSTOLIC 20 MG TABS TAKE 2 TABLETS BY MOUTH EVERY DAY  60 tablet  10  . chlorthalidone (HYGROTON) 25 MG tablet TAKE 1 TABLET BY MOUTH EVERY MORNING  30 tablet  5  . Docusate Calcium ([employer] STOOL SOFTENER PO) Take 2 tablets by mouth 2 (two) times daily.       . hydrALAZINE (APRESOLINE) 50 MG tablet Take 1 tablet (50 mg total) by mouth 3 (three) times daily.  270 tablet  3  . ibuprofen (ADVIL,MOTRIN) 200 MG tablet Take 400 mg by mouth every 6 (six) hours as needed. For pain      . Lancets (ONETOUCH ULTRASOFT) lancets TEST AS DIRECTED  100 each  0  . metFORMIN (GLUCOPHAGE) 500 MG tablet Take 2 tablets by mouth twice daily  120 tablet  11  . nebivolol (BYSTOLIC) 10 MG tablet Take 1 tablet (10 mg total) by mouth daily. Take 4 tablets (40mg  total) by mouth daily.  120 tablet  11  . omeprazole (PRILOSEC) 20 MG capsule Take 1 capsule (20 mg total) by mouth daily.  30 capsule  11  . ONE TOUCH ULTRA TEST test strip TEST THREE TIMES A DAY AS INSTRUCTED.  100 each  1  . pravastatin (PRAVACHOL) 40 MG tablet Take 1 tablet (40 mg total) by mouth daily.  90 tablet  3  . sitaGLIPtin (JANUVIA) 100 MG tablet Take 1 tablet (100 mg total) by mouth daily.  90 tablet  3   No current facility-administered medications on file  prior to visit.   Review of Systems  Constitutional: Negative for unusual diaphoresis or other sweats  HENT: Negative for ringing in ear Eyes: Negative for double vision or worsening visual disturbance.  Respiratory: Negative for choking and stridor.   Gastrointestinal: Negative for vomiting or other signifcant bowel change Genitourinary: Negative for hematuria or decreased urine volume.  Musculoskeletal: Negative for other MSK pain or swelling Skin: Negative for color change and worsening wound.  Neurological: Negative for tremors and numbness other than noted  Psychiatric/Behavioral: Negative for decreased concentration or agitation other than above       Objective:   Physical Exam BP 120/80  Pulse 65  Temp(Src) 98.4 F (36.9 C) (Oral)  Ht 6\' 1"  (1.854 m)  Wt 189 lb (85.73 kg)  BMI 24.94 kg/m2  SpO2 97% VS noted, nontoxic Constitutional: Pt appears well-developed, well-nourished.  HENT: Head: NCAT.  Right Ear: External ear normal.  Left Ear: External ear normal.  Eyes: . Pupils are equal, round, and reactive to light. Conjunctivae and EOM are normal Neck: Normal range of motion. Neck supple.  Cardiovascular: Normal rate and regular rhythm.   Pulmonary/Chest: Effort normal and breath sounds normal.  Neurological: Pt is alert. Not confused , motor grossly intact Skin: Skin is warm. 1 cm area with central bite to right lateral penile shaft with mild tender, swelling, no drainage Psychiatric: Pt behavior is normal. No agitation.     Assessment & Plan:

## 2013-10-03 ENCOUNTER — Telehealth: Payer: Self-pay | Admitting: Cardiovascular Disease

## 2013-10-04 NOTE — Telephone Encounter (Signed)
Closed encounter °

## 2013-12-06 ENCOUNTER — Encounter: Payer: Self-pay | Admitting: Internal Medicine

## 2013-12-06 ENCOUNTER — Other Ambulatory Visit: Payer: Self-pay | Admitting: Internal Medicine

## 2013-12-06 ENCOUNTER — Encounter: Payer: Self-pay | Admitting: Cardiovascular Disease

## 2013-12-06 ENCOUNTER — Ambulatory Visit (INDEPENDENT_AMBULATORY_CARE_PROVIDER_SITE_OTHER): Payer: 59 | Admitting: Cardiovascular Disease

## 2013-12-06 ENCOUNTER — Ambulatory Visit (INDEPENDENT_AMBULATORY_CARE_PROVIDER_SITE_OTHER): Payer: 59 | Admitting: Internal Medicine

## 2013-12-06 ENCOUNTER — Other Ambulatory Visit (INDEPENDENT_AMBULATORY_CARE_PROVIDER_SITE_OTHER): Payer: 59

## 2013-12-06 VITALS — BP 118/70 | HR 63 | Ht 73.0 in | Wt 190.4 lb

## 2013-12-06 VITALS — BP 104/70 | HR 73 | Temp 98.3°F | Wt 190.0 lb

## 2013-12-06 DIAGNOSIS — S91339A Puncture wound without foreign body, unspecified foot, initial encounter: Secondary | ICD-10-CM | POA: Insufficient documentation

## 2013-12-06 DIAGNOSIS — Z23 Encounter for immunization: Secondary | ICD-10-CM

## 2013-12-06 DIAGNOSIS — S91331S Puncture wound without foreign body, right foot, sequela: Secondary | ICD-10-CM

## 2013-12-06 DIAGNOSIS — E119 Type 2 diabetes mellitus without complications: Secondary | ICD-10-CM

## 2013-12-06 DIAGNOSIS — E785 Hyperlipidemia, unspecified: Secondary | ICD-10-CM

## 2013-12-06 DIAGNOSIS — I10 Essential (primary) hypertension: Secondary | ICD-10-CM

## 2013-12-06 DIAGNOSIS — IMO0002 Reserved for concepts with insufficient information to code with codable children: Secondary | ICD-10-CM

## 2013-12-06 DIAGNOSIS — Z0000 Encounter for general adult medical examination without abnormal findings: Secondary | ICD-10-CM

## 2013-12-06 LAB — URINALYSIS, ROUTINE W REFLEX MICROSCOPIC
Bilirubin Urine: NEGATIVE
Hgb urine dipstick: NEGATIVE
KETONES UR: NEGATIVE
LEUKOCYTES UA: NEGATIVE
NITRITE: NEGATIVE
PH: 6 (ref 5.0–8.0)
SPECIFIC GRAVITY, URINE: 1.02 (ref 1.000–1.030)
Total Protein, Urine: NEGATIVE
Urine Glucose: NEGATIVE
Urobilinogen, UA: 0.2 (ref 0.0–1.0)

## 2013-12-06 LAB — HEPATIC FUNCTION PANEL
ALT: 21 U/L (ref 0–53)
AST: 22 U/L (ref 0–37)
Albumin: 4.4 g/dL (ref 3.5–5.2)
Alkaline Phosphatase: 50 U/L (ref 39–117)
BILIRUBIN TOTAL: 0.8 mg/dL (ref 0.2–1.2)
Bilirubin, Direct: 0.1 mg/dL (ref 0.0–0.3)
Total Protein: 7.1 g/dL (ref 6.0–8.3)

## 2013-12-06 LAB — BASIC METABOLIC PANEL
BUN: 18 mg/dL (ref 6–23)
CO2: 29 mEq/L (ref 19–32)
CREATININE: 1.3 mg/dL (ref 0.4–1.5)
Calcium: 9.9 mg/dL (ref 8.4–10.5)
Chloride: 102 mEq/L (ref 96–112)
GFR: 74.49 mL/min (ref >60.00)
GLUCOSE: 83 mg/dL (ref 70–99)
Potassium: 3.2 mEq/L — ABNORMAL LOW (ref 3.5–5.1)
Sodium: 142 mEq/L (ref 135–145)

## 2013-12-06 LAB — CBC WITH DIFFERENTIAL/PLATELET
BASOS PCT: 0.5 % (ref 0.0–3.0)
Basophils Absolute: 0 10*3/uL (ref 0.0–0.1)
EOS PCT: 5 % (ref 0.0–5.0)
Eosinophils Absolute: 0.4 10*3/uL (ref 0.0–0.7)
HEMATOCRIT: 40.7 % (ref 39.0–52.0)
Hemoglobin: 13.6 g/dL (ref 13.0–17.0)
LYMPHS ABS: 1.9 10*3/uL (ref 0.7–4.0)
Lymphocytes Relative: 26.7 % (ref 12.0–46.0)
MCHC: 33.5 g/dL (ref 30.0–36.0)
MCV: 97.4 fl (ref 78.0–100.0)
Monocytes Absolute: 0.9 10*3/uL (ref 0.1–1.0)
Monocytes Relative: 13.2 % — ABNORMAL HIGH (ref 3.0–12.0)
Neutro Abs: 3.9 10*3/uL (ref 1.4–7.7)
Neutrophils Relative %: 54.6 % (ref 43.0–77.0)
PLATELETS: 225 10*3/uL (ref 150.0–400.0)
RBC: 4.17 Mil/uL — AB (ref 4.22–5.81)
RDW: 14.2 % (ref 11.5–15.5)
WBC: 7.2 10*3/uL (ref 4.0–10.5)

## 2013-12-06 LAB — MICROALBUMIN / CREATININE URINE RATIO
CREATININE, U: 225.8 mg/dL
MICROALB UR: 2.7 mg/dL — AB (ref 0.0–1.9)
MICROALB/CREAT RATIO: 1.2 mg/g (ref 0.0–30.0)

## 2013-12-06 LAB — LIPID PANEL
Cholesterol: 144 mg/dL (ref 0–200)
HDL: 55.9 mg/dL (ref >39.00)
LDL CALC: 71 mg/dL (ref 0–99)
NonHDL: 88.1
TRIGLYCERIDES: 84 mg/dL (ref 0.0–149.0)
Total CHOL/HDL Ratio: 3
VLDL: 16.8 mg/dL (ref 0.0–40.0)

## 2013-12-06 LAB — PSA: PSA: 0.25 ng/mL (ref 0.10–4.00)

## 2013-12-06 LAB — HEMOGLOBIN A1C: Hgb A1c MFr Bld: 5.9 % (ref 4.6–6.5)

## 2013-12-06 LAB — TSH: TSH: 0.41 u[IU]/mL (ref 0.35–4.50)

## 2013-12-06 MED ORDER — POTASSIUM CHLORIDE ER 10 MEQ PO TBCR: 10.0000 meq | EXTENDED_RELEASE_TABLET | Freq: Every day | ORAL | Status: DC

## 2013-12-06 NOTE — Patient Instructions (Addendum)
You had the Tdap (tetanus) shot today  Please continue all other medications as before, and refills have been done if requested.  Please have the pharmacy call with any other refills you may need.  Please continue your efforts at being more active, low cholesterol diet, and weight control.  You are otherwise up to date with prevention measures today.  Please keep your appointments with your specialists as you may have planned  Please go to the LAB in the Basement (turn left off the elevator) for the tests to be done today  You will be contacted by phone if any changes need to be made immediately.  Otherwise, you will receive a letter about your results with an explanation, but please check with MyChart first.  Please remember to sign up for MyChart if you have not done so, as this will be important to you in the future with finding out test results, communicating by private email, and scheduling acute appointments online when needed.  Please return in 6 months, or sooner if needed  OK to cancel the Oct 2015 appt with me

## 2013-12-06 NOTE — Progress Notes (Signed)
Subjective:    Patient ID: [name], male    DOB: 01-06-1961, 53 y.o.   MRN: [id number]  HPI Here for wellness and f/u;  Overall doing ok;  Pt denies CP, worsening SOB, DOE, wheezing, orthopnea, PND, worsening LE edema, palpitations, dizziness or syncope.  Pt denies neurological change such as new headache, facial or extremity weakness.  Pt denies polydipsia, polyuria, or low sugar symptoms. Pt states overall good compliance with treatment and medications, good tolerability, and has been trying to follow lower cholesterol diet.  Pt denies worsening depressive symptoms, suicidal ideation or panic. No fever, night sweats, wt loss, loss of appetite, or other constitutional symptoms.  Pt states good ability with ADL's, has low fall risk, home safety reviewed and adequate, no other significant changes in hearing or vision, and only occasionally active with exercise.  Saw cardiology earlier today, doing well.  Did step on nail several days ago with small puncture wound now healing to right instep, but no red.sweling or tender or drainage.  Last Tetanus 2011 Past Medical History  Diagnosis Date  . Hypertension   . Hyperlipidemia   . Diabetes mellitus     type II  . LVH (left ventricular hypertrophy)     with systolic dysfunction by echo 10/03, EF 45-50% - Dr. Melvern Banker  . OSA (obstructive sleep apnea)   . DIABETES MELLITUS, TYPE II 08/29/2009  . HYPERLIPIDEMIA 08/29/2009  . HYPERTENSION 01/14/2008  . SLEEP APNEA, OBSTRUCTIVE 01/14/2008  . GERD (gastroesophageal reflux disease) 08/12/2010  . Chest pain 02/13/2012    echo - EF >55%;mod concentric L ventricular hypertrophy; atrial septum aneurysmal; patent foramen ovale suspected on color flow, LA mil/mod dilation; RV systolic pressure [id number]  . Chest pain 06/09/2007    echo - EF 45-50%; interarterial shunt; mild valvular regurgitation;   . Chest pain 06/09/2007    Myoview - EF 53%; normal perfusion all regions;EKG negative for ischemia   .  Nonspecific ST-T wave electrocardiographic changes 08/11/2006    Myoview - EF 49%; normal perfusion all regions, EKG negative for ischemia  . Lightheadedness 05/10/2002    30d monitor unremarkable  . Sleep apnea 08/13/2008    AHI 1.2; sleep study 10/2007 - AHI 9.47at 15cm H2O and 31.3 at 16cm H2O  . Hemorrhoids   . History of anal fissures    Past Surgical History  Procedure Laterality Date  . Anal sphincterotomy  08/20/11  . Cardiac catheterization  10/23/2008    normal coronary arteries and LV function  . Colonoscopy    . Polypectomy      reports that he has been smoking Cigarettes.  He has been smoking about 0.50 packs per day. He has never used smokeless tobacco. He reports that he drinks about 1.5 ounces of alcohol per week. He reports that he does not use illicit drugs. family history includes Heart disease in his father; Hypertension in his mother. There is no history of Colon cancer. Allergies  Allergen Reactions  . Sulfa Antibiotics Hives and Swelling  . Vicodin [Hydrocodone-Acetaminophen] Itching   Current Outpatient Prescriptions on File Prior to Visit  Medication Sig Dispense Refill  . amLODipine-benazepril (LOTREL) 10-40 MG per capsule Take 1 capsule by mouth daily.  90 capsule  3  . aspirin 81 MG EC tablet Take 81 mg by mouth daily.        . chlorthalidone (HYGROTON) 25 MG tablet TAKE 1 TABLET BY MOUTH EVERY MORNING  30 tablet  5  . Docusate Calcium ([employer] STOOL SOFTENER PO)  Take 2 tablets by mouth 2 (two) times daily.       . hydrALAZINE (APRESOLINE) 50 MG tablet Take 1 tablet (50 mg total) by mouth 3 (three) times daily.  270 tablet  3  . ibuprofen (ADVIL,MOTRIN) 200 MG tablet Take 400 mg by mouth every 6 (six) hours as needed. For pain      . Lancets (ONETOUCH ULTRASOFT) lancets TEST AS DIRECTED  100 each  0  . metFORMIN (GLUCOPHAGE) 500 MG tablet Take 2 tablets by mouth twice daily  120 tablet  11  . nebivolol (BYSTOLIC) 10 MG tablet Take 20 mg by mouth 2 (two) times  daily.      Marland Kitchen omeprazole (PRILOSEC) 20 MG capsule Take 1 capsule (20 mg total) by mouth daily.  30 capsule  11  . ONE TOUCH ULTRA TEST test strip TEST THREE TIMES A DAY AS INSTRUCTED.  100 each  1  . pravastatin (PRAVACHOL) 40 MG tablet Take 1 tablet (40 mg total) by mouth daily.  90 tablet  3  . sitaGLIPtin (JANUVIA) 100 MG tablet Take 1 tablet (100 mg total) by mouth daily.  90 tablet  3   No current facility-administered medications on file prior to visit.   Review of Systems Constitutional: Negative for increased diaphoresis, other activity, appetite or other siginficant weight change  HENT: Negative for worsening hearing loss, ear pain, facial swelling, mouth sores and neck stiffness.   Eyes: Negative for other worsening pain, redness or visual disturbance.  Respiratory: Negative for shortness of breath and wheezing.   Cardiovascular: Negative for chest pain and palpitations.  Gastrointestinal: Negative for diarrhea, blood in stool, abdominal distention or other pain Genitourinary: Negative for hematuria, flank pain or change in urine volume.  Musculoskeletal: Negative for myalgias or other joint complaints.  Skin: Negative for color change and wound.  Neurological: Negative for syncope and numbness. other than noted Hematological: Negative for adenopathy. or other swelling Psychiatric/Behavioral: Negative for hallucinations, self-injury, decreased concentration or other worsening agitation.      Objective:   Physical Exam BP 104/70  Pulse 73  Temp(Src) 98.3 F (36.8 C) (Oral)  Wt 190 lb (86.183 kg)  SpO2 97% VS noted,  Constitutional: Pt is oriented to person, place, and time. Appears well-developed and well-nourished.  Head: Normocephalic and atraumatic.  Right Ear: External ear normal.  Left Ear: External ear normal.  Nose: Nose normal.  Mouth/Throat: Oropharynx is clear and moist.  Eyes: Conjunctivae and EOM are normal. Pupils are equal, round, and reactive to light.    Neck: Normal range of motion. Neck supple. No JVD present. No tracheal deviation present.  Cardiovascular: Normal rate, regular rhythm, normal heart sounds and intact distal pulses.   Pulmonary/Chest: Effort normal and breath sounds without rales or wheezing  Abdominal: Soft. Bowel sounds are normal. NT. No HSM  Musculoskeletal: Normal range of motion. Exhibits no edema.  Lymphadenopathy:  Has no cervical adenopathy.  Neurological: Pt is alert and oriented to person, place, and time. Pt has normal reflexes. No cranial nerve deficit. Motor grossly intact Skin: Skin is warm and dry. No rash noted. Right foot instep with mild erythea/swelling at small puncture wound site but nontender, no drainage or red streaks Psychiatric:  Has normal mood and affect. Behavior is normal.     Assessment & Plan:

## 2013-12-06 NOTE — Addendum Note (Signed)
Addended by: Sharon Seller B on: 12/06/2013 02:07 PM   Modules accepted: Orders

## 2013-12-06 NOTE — Assessment & Plan Note (Signed)
Doing well without cellultitis, for tdap today,  to f/u any worsening symptoms or concerns

## 2013-12-06 NOTE — Assessment & Plan Note (Signed)
stable overall by history and exam, recent data reviewed with pt, and pt to continue medical treatment as before,  to f/u any worsening symptoms or concerns Lab Results  Component Value Date   HGBA1C 6.0 06/21/2013   For f/u lab today

## 2013-12-06 NOTE — Progress Notes (Signed)
12/06/2013 [name]   1960/07/10  [id number]  Primary Physician Cathlean Cower, MD Primary Cardiologist: Jonathan Harp MD Renae Gloss   HPI:  The patient is a 53 year old, mildly overweight, single [place] American male, father of 44, grandfather to 2 grandchildren who works as a Administrator for [employer]. I saw him a year ago. His risk factors include tobacco abuse, hypertension and dyslipidemia. I catheterized him radially October 23, 2008, revealing normal coronary arteries and normal LV function. I felt his chest pain symptoms were related to reflux. Dr. Jenny Reichmann has been adjusting antihypertensive medications. He is aware of salt intake. He does have significant LVH on his EKG with a past echo performed 2 years ago revealed moderate concentric LVH with normal LV function..Since I saw him a year ago he is completely asymptomatic. His blood pressure is in the much better control and the addition of hydralazine. His most recent lipid profile performed 06/21/13 with a glucose of 124, LDL 50 and HDL 47.    Current Outpatient Prescriptions  Medication Sig Dispense Refill  . amLODipine-benazepril (LOTREL) 10-40 MG per capsule Take 1 capsule by mouth daily.  90 capsule  3  . aspirin 81 MG EC tablet Take 81 mg by mouth daily.        . chlorthalidone (HYGROTON) 25 MG tablet TAKE 1 TABLET BY MOUTH EVERY MORNING  30 tablet  5  . Docusate Calcium ([employer] STOOL SOFTENER PO) Take 2 tablets by mouth 2 (two) times daily.       Marland Kitchen doxycycline (VIBRA-TABS) 100 MG tablet Take 1 tablet (100 mg total) by mouth 2 (two) times daily.  20 tablet  0  . hydrALAZINE (APRESOLINE) 50 MG tablet Take 1 tablet (50 mg total) by mouth 3 (three) times daily.  270 tablet  3  . ibuprofen (ADVIL,MOTRIN) 200 MG tablet Take 400 mg by mouth every 6 (six) hours as needed. For pain      . Lancets (ONETOUCH ULTRASOFT) lancets TEST AS DIRECTED  100 each  0  . metFORMIN (GLUCOPHAGE) 500 MG tablet Take 2 tablets by  mouth twice daily  120 tablet  11  . nebivolol (BYSTOLIC) 10 MG tablet Take 20 mg by mouth 2 (two) times daily.      Marland Kitchen omeprazole (PRILOSEC) 20 MG capsule Take 1 capsule (20 mg total) by mouth daily.  30 capsule  11  . ONE TOUCH ULTRA TEST test strip TEST THREE TIMES A DAY AS INSTRUCTED.  100 each  1  . pravastatin (PRAVACHOL) 40 MG tablet Take 1 tablet (40 mg total) by mouth daily.  90 tablet  3  . sitaGLIPtin (JANUVIA) 100 MG tablet Take 1 tablet (100 mg total) by mouth daily.  90 tablet  3   No current facility-administered medications for this visit.    Allergies  Allergen Reactions  . Sulfa Antibiotics Hives and Swelling  . Vicodin [Hydrocodone-Acetaminophen] Itching    History   Social History  . Marital Status: Single    Spouse Name: N/A    Number of Children: 1  . Years of Education: N/A   Occupational History  . work as truck Environmental health practitioner Tobacco   Social History Main Topics  . Smoking status: Current Every Day Smoker -- 0.50 packs/day    Types: Cigarettes  . Smokeless tobacco: Never Used  . Alcohol Use: 1.5 oz/week    3 drink(s) per week     Comment: occaisionally  . Drug Use: No  . Sexual  Activity: Not on file   Other Topics Concern  . Not on file   Social History Narrative  . No narrative on file     Review of Systems: General: negative for chills, fever, night sweats or weight changes.  Cardiovascular: negative for chest pain, dyspnea on exertion, edema, orthopnea, palpitations, paroxysmal nocturnal dyspnea or shortness of breath Dermatological: negative for rash Respiratory: negative for cough or wheezing Urologic: negative for hematuria Abdominal: negative for nausea, vomiting, diarrhea, bright red blood per rectum, melena, or hematemesis Neurologic: negative for visual changes, syncope, or dizziness All other systems reviewed and are otherwise negative except as noted above.    Blood pressure 118/70, pulse 63, height 6\' 1"  (1.854 m), weight  190 lb 6.4 oz (86.365 kg).  General appearance: alert and no distress Neck: no adenopathy, no carotid bruit, no JVD, supple, symmetrical, trachea midline and thyroid not enlarged, symmetric, no tenderness/mass/nodules Lungs: clear to auscultation bilaterally Heart: regular rate and rhythm, S1, S2 normal, no murmur, click, rub or gallop Extremities: extremities normal, atraumatic, no cyanosis or edema  EKG normal sinus rhythm at 63 without ST or T wave changes.  ASSESSMENT AND PLAN:   HYPERLIPIDEMIA On statin therapy with recent lipid profile performed 06/21/13 revealing a total cholesterol of 124, LDL 50 and HDL of 47  HYPERTENSION Controlled on current medications      Jonathan Harp MD [facility], [facility] 12/06/2013 8:56 AM

## 2013-12-06 NOTE — Progress Notes (Signed)
Pre visit review using our clinic review tool, if applicable. No additional management support is needed unless otherwise documented below in the visit note. 

## 2013-12-06 NOTE — Assessment & Plan Note (Signed)
Controlled on current medications 

## 2013-12-06 NOTE — Patient Instructions (Signed)
Dr Gwenlyn Found wants you to follow-up in 1 year. You will receive a reminder letter in the mail one to two months in advance. If you don't receive a letter, please call our office to schedule the follow-up appointment.

## 2013-12-06 NOTE — Assessment & Plan Note (Signed)
On statin therapy with recent lipid profile performed 06/21/13 revealing a total cholesterol of 124, LDL 50 and HDL of 47

## 2013-12-06 NOTE — Assessment & Plan Note (Signed)

## 2013-12-08 ENCOUNTER — Other Ambulatory Visit: Payer: Self-pay | Admitting: Physician Assistant

## 2013-12-08 NOTE — Telephone Encounter (Signed)
Rx was sent to pharmacy electronically. 

## 2013-12-30 ENCOUNTER — Ambulatory Visit: Payer: 59 | Admitting: Internal Medicine

## 2014-02-12 ENCOUNTER — Other Ambulatory Visit: Payer: Self-pay | Admitting: Cardiovascular Disease

## 2014-02-13 NOTE — Telephone Encounter (Signed)
Rx refill sent to patient pharmacy   

## 2014-03-12 ENCOUNTER — Other Ambulatory Visit: Payer: Self-pay | Admitting: Internal Medicine

## 2014-05-26 ENCOUNTER — Other Ambulatory Visit: Payer: Self-pay

## 2014-05-30 ENCOUNTER — Encounter: Payer: Self-pay | Admitting: Internal Medicine

## 2014-05-30 ENCOUNTER — Ambulatory Visit (INDEPENDENT_AMBULATORY_CARE_PROVIDER_SITE_OTHER): Payer: 59 | Admitting: Internal Medicine

## 2014-05-30 VITALS — BP 116/80 | HR 60 | Temp 98.7°F | Resp 18 | Ht 73.0 in | Wt 189.0 lb

## 2014-05-30 DIAGNOSIS — Z0000 Encounter for general adult medical examination without abnormal findings: Secondary | ICD-10-CM

## 2014-05-30 DIAGNOSIS — J029 Acute pharyngitis, unspecified: Secondary | ICD-10-CM

## 2014-05-30 DIAGNOSIS — Z0189 Encounter for other specified special examinations: Secondary | ICD-10-CM

## 2014-05-30 DIAGNOSIS — I10 Essential (primary) hypertension: Secondary | ICD-10-CM

## 2014-05-30 DIAGNOSIS — E119 Type 2 diabetes mellitus without complications: Secondary | ICD-10-CM

## 2014-05-30 MED ORDER — AZITHROMYCIN 250 MG PO TABS: ORAL_TABLET | ORAL | Status: DC

## 2014-05-30 NOTE — Patient Instructions (Signed)
Please take all new medication as prescribed  Please continue all other medications as before, and refills have been done if requested.  Please have the pharmacy call with any other refills you may need.  Please continue your efforts at being more active, low cholesterol diet, and weight control.  Please keep your appointments with your specialists as you may have planned  Please go to the LAB in the Basement (turn left off the elevator) for the tests to be done tomorrow  You will be contacted by phone if any changes need to be made immediately.  Otherwise, you will receive a letter about your results with an explanation, but please check with MyChart first.  Please remember to sign up for MyChart if you have not done so, as this will be important to you in the future with finding out test results, communicating by private email, and scheduling acute appointments online when needed.  Please return in 6 months, or sooner if needed, with Lab testing done 3-5 days before (the office will call regarding changing the appointment)

## 2014-05-30 NOTE — Progress Notes (Signed)
Pre visit review using our clinic review tool, if applicable. No additional management support is needed unless otherwise documented below in the visit note. 

## 2014-05-30 NOTE — Progress Notes (Signed)
Subjective:    Patient ID: [name], male    DOB: 07-04-1960, 54 y.o.   MRN: [id number]  HPI  Here with 2-3 days acute onset fever, severe ST pressure, headache, general weakness and malaise, and greenish d/c, with mild non prodcough, but pt denies chest pain, wheezing, increased sob or doe, orthopnea, PND, increased LE swelling, palpitations, dizziness or syncope.  Pt denies new neurological symptoms such as new headache, or facial or extremity weakness or numbness   Pt denies polydipsia, polyuria, or low sugar symptoms such as weakness or confusion improved with po intake.  Pt states overall good compliance with meds, trying to follow lower cholesterol, diabetic diet, wt overall stable but little exercise however.    Past Medical History  Diagnosis Date  . Hypertension   . Hyperlipidemia   . Diabetes mellitus     type II  . LVH (left ventricular hypertrophy)     with systolic dysfunction by echo 10/03, EF 45-50% - Dr. Melvern Banker  . OSA (obstructive sleep apnea)   . DIABETES MELLITUS, TYPE II 08/29/2009  . HYPERLIPIDEMIA 08/29/2009  . HYPERTENSION 01/14/2008  . SLEEP APNEA, OBSTRUCTIVE 01/14/2008  . GERD (gastroesophageal reflux disease) 08/12/2010  . Chest pain 02/13/2012    echo - EF >55%;mod concentric L ventricular hypertrophy; atrial septum aneurysmal; patent foramen ovale suspected on color flow, LA mil/mod dilation; RV systolic pressure [id number]  . Chest pain 06/09/2007    echo - EF 45-50%; interarterial shunt; mild valvular regurgitation;   . Chest pain 06/09/2007    Myoview - EF 53%; normal perfusion all regions;EKG negative for ischemia   . Nonspecific ST-T wave electrocardiographic changes 08/11/2006    Myoview - EF 49%; normal perfusion all regions, EKG negative for ischemia  . Lightheadedness 05/10/2002    30d monitor unremarkable  . Sleep apnea 08/13/2008    AHI 1.2; sleep study 10/2007 - AHI 9.47at 15cm H2O and 31.3 at 16cm H2O  . Hemorrhoids   . History of anal fissures     Past Surgical History  Procedure Laterality Date  . Anal sphincterotomy  08/20/11  . Cardiac catheterization  10/23/2008    normal coronary arteries and LV function  . Colonoscopy    . Polypectomy      reports that he has been smoking Cigarettes.  He has been smoking about 0.50 packs per day. He has never used smokeless tobacco. He reports that he drinks about 1.5 oz of alcohol per week. He reports that he does not use illicit drugs. family history includes Heart disease in his father; Hypertension in his mother. There is no history of Colon cancer. Allergies  Allergen Reactions  . Sulfa Antibiotics Hives and Swelling  . Vicodin [Hydrocodone-Acetaminophen] Itching   Current Outpatient Prescriptions on File Prior to Visit  Medication Sig Dispense Refill  . amLODipine-benazepril (LOTREL) 10-40 MG per capsule Take 1 capsule by mouth daily. 90 capsule 3  . aspirin 81 MG EC tablet Take 81 mg by mouth daily.      Marland Kitchen BYSTOLIC 10 MG tablet TAKE 4 TABLETS (40MG  TOTAL) BY MOUTH DAILY. 120 tablet 5  . chlorthalidone (HYGROTON) 25 MG tablet TAKE 1 TABLET BY MOUTH EVERY MORNING 30 tablet 10  . Docusate Calcium ([facility] STOOL SOFTENER PO) Take 2 tablets by mouth 2 (two) times daily.     . hydrALAZINE (APRESOLINE) 50 MG tablet TAKE 1 TABLET BY MOUTH 3 TIMES A DAY 270 tablet 3  . ibuprofen (ADVIL,MOTRIN) 200 MG tablet Take 400 mg by  mouth every 6 (six) hours as needed. For pain    . Lancets (ONETOUCH ULTRASOFT) lancets TEST AS DIRECTED 100 each 0  . metFORMIN (GLUCOPHAGE) 500 MG tablet Take 2 tablets by mouth twice daily 120 tablet 11  . nebivolol (BYSTOLIC) 10 MG tablet Take 20 mg by mouth 2 (two) times daily.    Marland Kitchen omeprazole (PRILOSEC) 20 MG capsule Take 1 capsule (20 mg total) by mouth daily. 30 capsule 11  . ONE TOUCH ULTRA TEST test strip TEST THREE TIMES A DAY AS INSTRUCTED. 100 each 1  . potassium chloride (KLOR-CON 10) 10 MEQ tablet Take 1 tablet (10 mEq total) by mouth daily. 90 tablet 3  .  pravastatin (PRAVACHOL) 40 MG tablet Take 1 tablet (40 mg total) by mouth daily. 90 tablet 3  . sitaGLIPtin (JANUVIA) 100 MG tablet Take 1 tablet (100 mg total) by mouth daily. 90 tablet 3   No current facility-administered medications on file prior to visit.   Review of Systems  Constitutional: Negative for unusual diaphoresis or other sweats  HENT: Negative for ringing in ear Eyes: Negative for double vision or worsening visual disturbance.  Respiratory: Negative for choking and stridor.   Gastrointestinal: Negative for vomiting or other signifcant bowel change Genitourinary: Negative for hematuria or decreased urine volume.  Musculoskeletal: Negative for other MSK pain or swelling Skin: Negative for color change and worsening wound.  Neurological: Negative for tremors and numbness other than noted  Psychiatric/Behavioral: Negative for decreased concentration or agitation other than above       Objective:   Physical Exam BP 116/80 mmHg  Pulse 60  Temp(Src) 98.7 F (37.1 C) (Oral)  Resp 18  Ht 6\' 1"  (1.854 m)  Wt 189 lb 0.6 oz (85.748 kg)  BMI 24.95 kg/m2  SpO2 96% VS noted, mild ill Constitutional: Pt appears well-developed, well-nourished.  HENT: Head: NCAT.  Right Ear: External ear normal.  Left Ear: External ear normal.  Eyes: . Pupils are equal, round, and reactive to light. Conjunctivae and EOM are normal Bilat tm's with mild erythema.  Max sinus areas mild tender.  Pharynx with severe erythema, + exudate Neck: Normal range of motion. Neck supple.  Cardiovascular: Normal rate and regular rhythm.   Pulmonary/Chest: Effort normal and breath sounds without rales or wheezing.  Neurological: Pt is alert. Not confused , motor grossly intact Skin: Skin is warm. No rash Psychiatric: Pt behavior is normal. No agitation.     Assessment & Plan:

## 2014-06-01 NOTE — Assessment & Plan Note (Signed)
mod, for antibx course,  to f/u any worsening symptoms or concerns

## 2014-06-01 NOTE — Assessment & Plan Note (Signed)
stable overall by history and exam, recent data reviewed with pt, and pt to continue medical treatment as before,  to f/u any worsening symptoms or concerns Lab Results  Component Value Date   HGBA1C 5.9 12/06/2013

## 2014-06-01 NOTE — Assessment & Plan Note (Signed)
stable overall by history and exam, recent data reviewed with pt, and pt to continue medical treatment as before,  to f/u any worsening symptoms or concerns BP Readings from Last 3 Encounters:  05/30/14 116/80  12/06/13 104/70  12/06/13 118/70

## 2014-06-06 ENCOUNTER — Ambulatory Visit: Payer: 59 | Admitting: Internal Medicine

## 2014-06-19 ENCOUNTER — Other Ambulatory Visit: Payer: Self-pay | Admitting: Internal Medicine

## 2014-07-21 ENCOUNTER — Other Ambulatory Visit: Payer: Self-pay | Admitting: Internal Medicine

## 2014-08-08 ENCOUNTER — Other Ambulatory Visit: Payer: Self-pay | Admitting: Internal Medicine

## 2014-08-29 ENCOUNTER — Emergency Department (HOSPITAL_COMMUNITY)
Admission: EM | Admit: 2014-08-29 | Discharge: 2014-08-29 | Disposition: A | Discharge status: Home / Self Care | Payer: 59 | Source: Home / Self Care | Attending: Family Medicine | Admitting: Family Medicine

## 2014-08-29 ENCOUNTER — Encounter (HOSPITAL_COMMUNITY): Payer: Self-pay | Admitting: Family Medicine

## 2014-08-29 DIAGNOSIS — W57XXXA Bitten or stung by nonvenomous insect and other nonvenomous arthropods, initial encounter: Secondary | ICD-10-CM | POA: Diagnosis not present

## 2014-08-29 DIAGNOSIS — T148 Other injury of unspecified body region: Secondary | ICD-10-CM | POA: Diagnosis not present

## 2014-08-29 MED ORDER — DOXYCYCLINE HYCLATE 100 MG PO TABS: 100.0000 mg | ORAL_TABLET | Freq: Two times a day (BID) | ORAL | Status: DC

## 2014-08-29 NOTE — ED Provider Notes (Signed)
CSN: [id number]     Arrival date & time 08/29/14  1615 History   First MD Initiated Contact with Patient 08/29/14 1706     No chief complaint on file.  (Consider location/radiation/quality/duration/timing/severity/associated sxs/prior Treatment) HPI tick bite: Tick was removed from left antecubital fossa 7 days ago. Developed hard tender lump. Eugenio Hoes remained but is no longer tender. Patient has not tried anything to make the lesion better. Lesion is constant. Tick was in patient's arm for approximately 24 hours. Single subjective fever reported. Denies any rash, or pain, joint effusions, palpitations, headache, neck stiffness.  Past Medical History  Diagnosis Date  . Hypertension   . Hyperlipidemia   . Diabetes mellitus     type II  . LVH (left ventricular hypertrophy)     with systolic dysfunction by echo 10/03, EF 45-50% - Dr. Melvern Banker  . OSA (obstructive sleep apnea)   . DIABETES MELLITUS, TYPE II 08/29/2009  . HYPERLIPIDEMIA 08/29/2009  . HYPERTENSION 01/14/2008  . SLEEP APNEA, OBSTRUCTIVE 01/14/2008  . GERD (gastroesophageal reflux disease) 08/12/2010  . Chest pain 02/13/2012    echo - EF >55%;mod concentric L ventricular hypertrophy; atrial septum aneurysmal; patent foramen ovale suspected on color flow, LA mil/mod dilation; RV systolic pressure [id number]  . Chest pain 06/09/2007    echo - EF 45-50%; interarterial shunt; mild valvular regurgitation;   . Chest pain 06/09/2007    Myoview - EF 53%; normal perfusion all regions;EKG negative for ischemia   . Nonspecific ST-T wave electrocardiographic changes 08/11/2006    Myoview - EF 49%; normal perfusion all regions, EKG negative for ischemia  . Lightheadedness 05/10/2002    30d monitor unremarkable  . Sleep apnea 08/13/2008    AHI 1.2; sleep study 10/2007 - AHI 9.47at 15cm H2O and 31.3 at 16cm H2O  . Hemorrhoids   . History of anal fissures    Past Surgical History  Procedure Laterality Date  . Anal sphincterotomy  08/20/11  . Cardiac  catheterization  10/23/2008    normal coronary arteries and LV function  . Colonoscopy    . Polypectomy     Family History  Problem Relation Age of Onset  . Hypertension Mother   . Heart disease Father   . Colon cancer Neg Hx    History  Substance Use Topics  . Smoking status: Current Every Day Smoker -- 0.50 packs/day    Types: Cigarettes  . Smokeless tobacco: Never Used  . Alcohol Use: 1.5 oz/week    3 drink(s) per week     Comment: occaisionally    Review of Systems Per HPI with all other pertinent systems negative.   Allergies  Sulfa antibiotics and Vicodin  Home Medications   Prior to Admission medications   Medication Sig Start Date End Date Taking? Authorizing Provider  amLODipine-benazepril (LOTREL) 10-40 MG per capsule TAKE 1 CAPSULE BY MOUTH DAILY. 08/08/14   Biagio Borg, MD  aspirin 81 MG EC tablet Take 81 mg by mouth daily.      Historical Provider, MD  azithromycin (ZITHROMAX Z-PAK) 250 MG tablet Use as directed 05/30/14   Biagio Borg, MD  BYSTOLIC 10 MG tablet TAKE 4 TABLETS (40MG  TOTAL) BY MOUTH DAILY. 03/13/14   Biagio Borg, MD  chlorthalidone (HYGROTON) 25 MG tablet TAKE 1 TABLET BY MOUTH EVERY MORNING 02/13/14   Lorretta Harp, MD  Docusate Calcium ([employer] STOOL SOFTENER PO) Take 2 tablets by mouth 2 (two) times daily.     Historical Provider, MD  doxycycline (VIBRA-TABS)  100 MG tablet Take 1 tablet (100 mg total) by mouth 2 (two) times daily. 08/29/14   Waldemar Dickens, MD  hydrALAZINE (APRESOLINE) 50 MG tablet TAKE 1 TABLET BY MOUTH 3 TIMES A DAY 12/08/13   Lorretta Harp, MD  ibuprofen (ADVIL,MOTRIN) 200 MG tablet Take 400 mg by mouth every 6 (six) hours as needed. For pain    Historical Provider, MD  JANUVIA 100 MG tablet TAKE 1 TABLET (100 MG TOTAL) BY MOUTH DAILY. 07/24/14   Biagio Borg, MD  Lancets [facility] ULTRASOFT) lancets TEST AS DIRECTED 05/05/13   Biagio Borg, MD  metFORMIN (GLUCOPHAGE) 500 MG tablet TAKE 2 TABLETS BY MOUTH TWICE DAILY 07/21/14    Biagio Borg, MD  nebivolol (BYSTOLIC) 10 MG tablet Take 20 mg by mouth 2 (two) times daily. 03/28/13   Biagio Borg, MD  omeprazole (PRILOSEC) 20 MG capsule TAKE 1 CAPSULE (20 MG TOTAL) BY MOUTH DAILY. 06/19/14   Biagio Borg, MD  ONE TOUCH ULTRA TEST test strip TEST THREE TIMES A DAY AS INSTRUCTED. 05/04/13   Biagio Borg, MD  potassium chloride (KLOR-CON 10) 10 MEQ tablet Take 1 tablet (10 mEq total) by mouth daily. 12/06/13   Biagio Borg, MD  pravastatin (PRAVACHOL) 40 MG tablet Take 1 tablet (40 mg total) by mouth daily. 06/29/13   Biagio Borg, MD   BP 114/73 mmHg  Pulse 70  Temp(Src) 97.5 F (36.4 C) (Oral)  Resp 18  SpO2 96% Physical Exam Physical Exam  Constitutional: oriented to person, place, and time. appears well-developed and well-nourished. No distress.  HENT:  Head: Normocephalic and atraumatic.  Eyes: EOMI. PERRL.  Neck: Normal range of motion.  Cardiovascular: RRR, no m/r/g, 2+ distal pulses,  Pulmonary/Chest: Effort normal and breath sounds normal. No respiratory distress.  Abdominal: Soft. Bowel sounds are normal. NonTTP, no distension.  Musculoskeletal: Normal range of motion. Non ttp, no effusion.  Neurological: alert and oriented to person, place, and time.  Skin: Cubital fossa with pea-sized firm lesion without tenderness or surrounding erythema induration or fluctuance. No residual tick left and patient.Marland Kitchen  Psychiatric: normal mood and affect. behavior is normal. Judgment and thought content normal.   ED Course  Procedures (including critical care time) Labs Review Labs Reviewed - No data to display  Imaging Review No results found.   MDM   1. Tick bite    Patient states he came in because his significant other was concerned as he had a subjective fever the other night. This is not confirmed with a thermometer. No other symptoms concerning for tick borne illnesses such as ehrlichiosis, RMSF, Lyme. No evidence of cellulitis. Will give patient prescription for  Doxy and instructed to start if he does develop true documented fevers or other symptoms concerning for infectious processes listed above. Otherwise patient will plan glottic ointment and vitamin E lotion or ointment to the area to soften the area of scarring.Waldemar Dickens, MD 08/29/14 [phone]

## 2014-08-29 NOTE — ED Notes (Signed)
Patient c/o tick bite x 1 week ago. Patient reports that he removed the tick from his left forearm. Patient is concerned about bite healing. Patient is in NAD.

## 2014-08-29 NOTE — Discharge Instructions (Signed)
The small bowel and your arm is likely from an inflammatory response from the tick bite and will soften with time. Place some antibiotic ointment or vitamin E lotion or ointment on the area to help soften it over time. If you develop fevers rash or if the tick bite area becomes painful please start antibiotics.

## 2014-09-15 ENCOUNTER — Other Ambulatory Visit: Payer: Self-pay | Admitting: Cardiovascular Disease

## 2014-09-18 NOTE — Telephone Encounter (Signed)
Rx(s) sent to pharmacy electronically.  

## 2014-09-19 ENCOUNTER — Other Ambulatory Visit: Payer: Self-pay | Admitting: Internal Medicine

## 2014-10-15 ENCOUNTER — Other Ambulatory Visit: Payer: Self-pay | Admitting: Internal Medicine

## 2014-10-16 ENCOUNTER — Other Ambulatory Visit: Payer: Self-pay

## 2014-10-16 MED ORDER — NEBIVOLOL HCL 10 MG PO TABS
ORAL_TABLET | ORAL | Status: DC
Start: 2014-10-16 — End: 2014-11-21

## 2014-10-18 ENCOUNTER — Other Ambulatory Visit: Payer: Self-pay | Admitting: Internal Medicine

## 2014-11-09 ENCOUNTER — Encounter: Payer: Self-pay | Admitting: Internal Medicine

## 2014-11-09 ENCOUNTER — Ambulatory Visit (INDEPENDENT_AMBULATORY_CARE_PROVIDER_SITE_OTHER): Payer: 59 | Admitting: Internal Medicine

## 2014-11-09 VITALS — BP 118/86 | HR 76 | Temp 98.8°F | Ht 73.0 in | Wt 181.0 lb

## 2014-11-09 DIAGNOSIS — R221 Localized swelling, mass and lump, neck: Secondary | ICD-10-CM

## 2014-11-09 DIAGNOSIS — E119 Type 2 diabetes mellitus without complications: Secondary | ICD-10-CM

## 2014-11-09 DIAGNOSIS — I10 Essential (primary) hypertension: Secondary | ICD-10-CM | POA: Diagnosis not present

## 2014-11-09 MED ORDER — ACETAMINOPHEN-CODEINE 300-30 MG PO TABS
1.0000 | ORAL_TABLET | Freq: Four times a day (QID) | ORAL | Status: DC | PRN
Start: 2014-11-09 — End: 2015-12-25

## 2014-11-09 MED ORDER — KETOROLAC TROMETHAMINE 30 MG/ML IM SOLN
30.0000 mg | Freq: Once | INTRAMUSCULAR | Status: AC
  Administered 2014-11-09: 30 mg via INTRAMUSCULAR

## 2014-11-09 MED ORDER — AMOXICILLIN-POT CLAVULANATE 875-125 MG PO TABS: 1.0000 | ORAL_TABLET | Freq: Two times a day (BID) | ORAL | Status: DC

## 2014-11-09 NOTE — Assessment & Plan Note (Signed)
stable overall by history and exam, recent data reviewed with pt, and pt to continue medical treatment as before,  to f/u any worsening symptoms or concerns BP Readings from Last 3 Encounters:  11/09/14 118/86  08/29/14 114/73  05/30/14 116/80

## 2014-11-09 NOTE — Addendum Note (Signed)
Addended by: Biagio Borg on: 11/09/2014 12:20 PM   Modules accepted: Miquel Dunn

## 2014-11-09 NOTE — Patient Instructions (Signed)
You had the pain shot in the office today (toradol)  Please take all new medication as prescribed - the antibiotic, as well as the pain medication if needed  Please call tomorrow if you are not at least a little better (or are worse) as you may need a CT scan of neck  Please continue all other medications as before, and refills have been done if requested.  Please have the pharmacy call with any other refills you may need.  Please keep your appointments with your specialists as you may have planned

## 2014-11-09 NOTE — Progress Notes (Signed)
Subjective:    Patient ID: [name], male    DOB: [birth year], 54 y.o.   MRN: [id number]  HPI    Here to f/u with 2-3 days onset large right neck swelling with mod to severe pain, without skin change or drainage, no fever, no ST, cough and Pt denies chest pain, increased sob or doe, wheezing, orthopnea, PND, increased LE swelling, palpitations, dizziness or syncope.  Pt denies new neurological symptoms such as new headache, or facial or extremity weakness or numbness   Pt denies polydipsia, polyuria, Past Medical History  Diagnosis Date  . Hypertension   . Hyperlipidemia   . Diabetes mellitus     type II  . LVH (left ventricular hypertrophy)     with systolic dysfunction by echo 10/03, EF 45-50% - Dr. Melvern Banker  . OSA (obstructive sleep apnea)   . DIABETES MELLITUS, TYPE II 08/29/2009  . HYPERLIPIDEMIA 08/29/2009  . HYPERTENSION 01/14/2008  . SLEEP APNEA, OBSTRUCTIVE 01/14/2008  . GERD (gastroesophageal reflux disease) 08/12/2010  . Chest pain 02/13/2012    echo - EF >55%;mod concentric L ventricular hypertrophy; atrial septum aneurysmal; patent foramen ovale suspected on color flow, LA mil/mod dilation; RV systolic pressure [id number]  . Chest pain 06/09/2007    echo - EF 45-50%; interarterial shunt; mild valvular regurgitation;   . Chest pain 06/09/2007    Myoview - EF 53%; normal perfusion all regions;EKG negative for ischemia   . Nonspecific ST-T wave electrocardiographic changes 08/11/2006    Myoview - EF 49%; normal perfusion all regions, EKG negative for ischemia  . Lightheadedness 05/10/2002    30d monitor unremarkable  . Sleep apnea 08/13/2008    AHI 1.2; sleep study 10/2007 - AHI 9.47at 15cm H2O and 31.3 at 16cm H2O  . Hemorrhoids   . History of anal fissures    Past Surgical History  Procedure Laterality Date  . Anal sphincterotomy  08/20/11  . Cardiac catheterization  10/23/2008    normal coronary arteries and LV function  . Colonoscopy    . Polypectomy      reports that  he has been smoking Cigarettes.  He has been smoking about 0.50 packs per day. He has never used smokeless tobacco. He reports that he drinks about 1.5 oz of alcohol per week. He reports that he does not use illicit drugs. family history includes Heart disease in his father; Hypertension in his mother. There is no history of Colon cancer. Allergies  Allergen Reactions  . Sulfa Antibiotics Hives and Swelling  . Vicodin [Hydrocodone-Acetaminophen] Itching   Current Outpatient Prescriptions on File Prior to Visit  Medication Sig Dispense Refill  . amLODipine-benazepril (LOTREL) 10-40 MG per capsule TAKE 1 CAPSULE BY MOUTH DAILY. 90 capsule 1  . aspirin 81 MG EC tablet Take 81 mg by mouth daily.      Marland Kitchen BYSTOLIC 10 MG tablet TAKE 4 TABLETS (40MG  TOTAL) BY MOUTH DAILY. 120 tablet 5  . chlorthalidone (HYGROTON) 25 MG tablet TAKE 1 TABLET BY MOUTH EVERY MORNING 30 tablet 10  . Docusate Calcium ([employer] STOOL SOFTENER PO) Take 2 tablets by mouth 2 (two) times daily.     . hydrALAZINE (APRESOLINE) 50 MG tablet TAKE 1 TABLET BY MOUTH 3 TIMES A DAY 270 tablet 0  . ibuprofen (ADVIL,MOTRIN) 200 MG tablet Take 400 mg by mouth every 6 (six) hours as needed. For pain    . JANUVIA 100 MG tablet TAKE 1 TABLET (100 MG TOTAL) BY MOUTH DAILY. 90 tablet 1  . Lancets (  ONETOUCH ULTRASOFT) lancets TEST AS DIRECTED 100 each 0  . metFORMIN (GLUCOPHAGE) 500 MG tablet TAKE 2 TABLETS BY MOUTH TWICE DAILY 120 tablet 5  . nebivolol (BYSTOLIC) 10 MG tablet Take 20 mg by mouth 2 (two) times daily.    . nebivolol (BYSTOLIC) 10 MG tablet TAKE 4 TABLETS (40MG  TOTAL) BY MOUTH DAILY. 120 tablet 2  . omeprazole (PRILOSEC) 20 MG capsule TAKE 1 CAPSULE (20 MG TOTAL) BY MOUTH DAILY. 30 capsule 11  . ONE TOUCH ULTRA TEST test strip TEST THREE TIMES A DAY AS INSTRUCTED. 100 each 1  . potassium chloride (KLOR-CON 10) 10 MEQ tablet Take 1 tablet (10 mEq total) by mouth daily. 90 tablet 3  . pravastatin (PRAVACHOL) 40 MG tablet TAKE 1 TABLET  (40 MG TOTAL) BY MOUTH DAILY. 90 tablet 0   No current facility-administered medications on file prior to visit.   Review of Systems  Constitutional: Negative for unusual diaphoresis or night sweats HENT: Negative for ringing in ear or discharge Eyes: Negative for double vision or worsening visual disturbance.  Respiratory: Negative for choking and stridor.   Gastrointestinal: Negative for vomiting or other signifcant bowel change Genitourinary: Negative for hematuria or change in urine volume.  Musculoskeletal: Negative for other MSK pain or swelling Skin: Negative for color change and worsening wound.  Neurological: Negative for tremors and numbness other than noted  Psychiatric/Behavioral: Negative for decreased concentration or agitation other than above       Objective:   Physical Exam BP 118/86 mmHg  Pulse 76  Temp(Src) 98.8 F (37.1 C) (Oral)  Ht 6\' 1"  (1.854 m)  Wt 181 lb (82.101 kg)  BMI 23.89 kg/m2  SpO2 97% VS noted, mild ill Constitutional: Pt appears in no significant distress HENT: Head: NCAT.  Right Ear: External ear normal.  Left Ear: External ear normal.  Eyes: . Pupils are equal, round, and reactive to light. Conjunctivae and EOM are normal Neck: Normal range of motion. Neck supple. but with nondiscrete large tender 4-5 cm egg shaped firm but not hard/non fluctuant subq submandibular mass, no skin change or drainage, pharynx with some vague right cheek sided diffuse swelling but no oral mucosa other change Cardiovascular: Normal rate and regular rhythm.   Pulmonary/Chest: Effort normal and breath sounds without rales or wheezing.  Neurological: Pt is alert. Not confused , motor grossly intact Skin: Skin is warm. No rash, no LE edema Psychiatric: Pt behavior is normal. No agitation.     Assessment & Plan:

## 2014-11-09 NOTE — Assessment & Plan Note (Addendum)
Likely infectious mass, suspect salivary gland? Vs other - for augmentin course, consider CT soon if not improving, consider ENT referral, for toradol IM today, and tylenol #3 prn for home pain control

## 2014-11-09 NOTE — Progress Notes (Signed)
Pre visit review using our clinic review tool, if applicable. No additional management support is needed unless otherwise documented below in the visit note. 

## 2014-11-09 NOTE — Assessment & Plan Note (Signed)
stable overall by history and exam, recent data reviewed with pt, and pt to continue medical treatment as before,  to f/u any worsening symptoms or concerns Lab Results  Component Value Date   HGBA1C 5.9 12/06/2013

## 2014-11-10 ENCOUNTER — Telehealth: Payer: Self-pay | Admitting: Internal Medicine

## 2014-11-10 DIAGNOSIS — R221 Localized swelling, mass and lump, neck: Secondary | ICD-10-CM

## 2014-11-10 NOTE — Telephone Encounter (Signed)
Patient was in yesterday and was told if he was no better this morning then you'll order a CT scan. He feels he will need the scan Please call pt at [phone]

## 2014-11-10 NOTE — Telephone Encounter (Signed)
Pt informed

## 2014-11-10 NOTE — Telephone Encounter (Signed)
Ct ordred stat

## 2014-11-15 ENCOUNTER — Ambulatory Visit (INDEPENDENT_AMBULATORY_CARE_PROVIDER_SITE_OTHER)
Admission: RE | Admit: 2014-11-15 | Discharge: 2014-11-15 | Disposition: A | Discharge status: Home / Self Care | Payer: 59 | Source: Ambulatory Visit | Attending: Internal Medicine | Admitting: Internal Medicine

## 2014-11-15 ENCOUNTER — Other Ambulatory Visit (INDEPENDENT_AMBULATORY_CARE_PROVIDER_SITE_OTHER): Payer: 59

## 2014-11-15 DIAGNOSIS — Z0189 Encounter for other specified special examinations: Secondary | ICD-10-CM | POA: Diagnosis not present

## 2014-11-15 DIAGNOSIS — R221 Localized swelling, mass and lump, neck: Secondary | ICD-10-CM

## 2014-11-15 DIAGNOSIS — Z0000 Encounter for general adult medical examination without abnormal findings: Secondary | ICD-10-CM

## 2014-11-15 DIAGNOSIS — E119 Type 2 diabetes mellitus without complications: Secondary | ICD-10-CM

## 2014-11-15 LAB — CREATININE, SERUM: Creatinine, Ser: 1.29 mg/dL (ref 0.40–1.50)

## 2014-11-15 LAB — CBC WITH DIFFERENTIAL/PLATELET
BASOS PCT: 0.6 % (ref 0.0–3.0)
Basophils Absolute: 0 10*3/uL (ref 0.0–0.1)
EOS PCT: 6.3 % — AB (ref 0.0–5.0)
Eosinophils Absolute: 0.5 10*3/uL (ref 0.0–0.7)
HEMATOCRIT: 39.3 % (ref 39.0–52.0)
HEMOGLOBIN: 12.9 g/dL — AB (ref 13.0–17.0)
LYMPHS PCT: 17.2 % (ref 12.0–46.0)
Lymphs Abs: 1.4 10*3/uL (ref 0.7–4.0)
MCHC: 32.9 g/dL (ref 30.0–36.0)
MCV: 97.9 fl (ref 78.0–100.0)
MONO ABS: 0.8 10*3/uL (ref 0.1–1.0)
MONOS PCT: 10.5 % (ref 3.0–12.0)
Neutro Abs: 5.2 10*3/uL (ref 1.4–7.7)
Neutrophils Relative %: 65.4 % (ref 43.0–77.0)
Platelets: 269 10*3/uL (ref 150.0–400.0)
RBC: 4.01 Mil/uL — AB (ref 4.22–5.81)
RDW: 13.5 % (ref 11.5–15.5)
WBC: 7.9 10*3/uL (ref 4.0–10.5)

## 2014-11-15 LAB — URINALYSIS, ROUTINE W REFLEX MICROSCOPIC
BILIRUBIN URINE: NEGATIVE
Hgb urine dipstick: NEGATIVE
KETONES UR: NEGATIVE
LEUKOCYTES UA: NEGATIVE
NITRITE: NEGATIVE
RBC / HPF: NONE SEEN (ref 0–?)
URINE GLUCOSE: NEGATIVE
Urobilinogen, UA: 0.2 (ref 0.0–1.0)
pH: 5.5 (ref 5.0–8.0)

## 2014-11-15 LAB — BASIC METABOLIC PANEL
BUN: 19 mg/dL (ref 6–23)
CALCIUM: 9.9 mg/dL (ref 8.4–10.5)
CO2: 29 mEq/L (ref 19–32)
CREATININE: 1.25 mg/dL (ref 0.40–1.50)
Chloride: 103 mEq/L (ref 96–112)
GFR: 76.98 mL/min (ref >60.00)
GLUCOSE: 96 mg/dL (ref 70–99)
POTASSIUM: 3.9 meq/L (ref 3.5–5.1)
Sodium: 143 mEq/L (ref 135–145)

## 2014-11-15 LAB — LIPID PANEL
CHOL/HDL RATIO: 3
CHOLESTEROL: 136 mg/dL (ref 0–200)
HDL: 51.9 mg/dL (ref >39.00)
LDL CALC: 70 mg/dL (ref 0–99)
NonHDL: 84.24
TRIGLYCERIDES: 69 mg/dL (ref 0.0–149.0)
VLDL: 13.8 mg/dL (ref 0.0–40.0)

## 2014-11-15 LAB — PSA: PSA: 0.27 ng/mL (ref 0.10–4.00)

## 2014-11-15 LAB — MICROALBUMIN / CREATININE URINE RATIO
Creatinine,U: 298.1 mg/dL
Microalb Creat Ratio: 1.7 mg/g (ref 0.0–30.0)
Microalb, Ur: 5.1 mg/dL — ABNORMAL HIGH (ref 0.0–1.9)

## 2014-11-15 LAB — HEPATIC FUNCTION PANEL
ALBUMIN: 4.4 g/dL (ref 3.5–5.2)
ALT: 10 U/L (ref 0–53)
AST: 14 U/L (ref 0–37)
Alkaline Phosphatase: 56 U/L (ref 39–117)
BILIRUBIN TOTAL: 0.5 mg/dL (ref 0.2–1.2)
Bilirubin, Direct: 0.1 mg/dL (ref 0.0–0.3)
Total Protein: 6.8 g/dL (ref 6.0–8.3)

## 2014-11-15 LAB — HEMOGLOBIN A1C: HEMOGLOBIN A1C: 5.7 % (ref 4.6–6.5)

## 2014-11-15 LAB — BUN: BUN: 19 mg/dL (ref 6–23)

## 2014-11-15 LAB — TSH: TSH: 0.54 u[IU]/mL (ref 0.35–4.50)

## 2014-11-15 IMAGING — CT CT NECK W/ CM
4 of 5 series · 16 of 33 positions shown, 18 images · IV contrast (omnipaque)
Comparison: None.

CLINICAL DATA: Two episodes of nontender right neck swelling with
difficulty swelling, in [REDACTED] and again 1 week ago.

EXAM:
CT NECK WITH CONTRAST
TECHNIQUE: Multidetector CT imaging of the neck was performed using the
standard protocol following the bolus administration of intravenous
contrast.
CONTRAST:  75mL OMNIPAQUE IOHEXOL 300 MG/ML  SOLN

[Series 3: neck_routine 3.0 b40s st · axial · 0.42mm/px · z∈[-209,-77]mm · 3 of 90 slices shown, 4 images]
[im 23/90  soft-tissue]
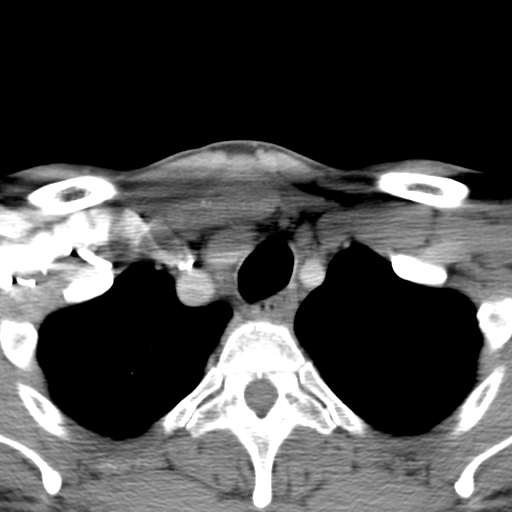
[im 23/90  bone]
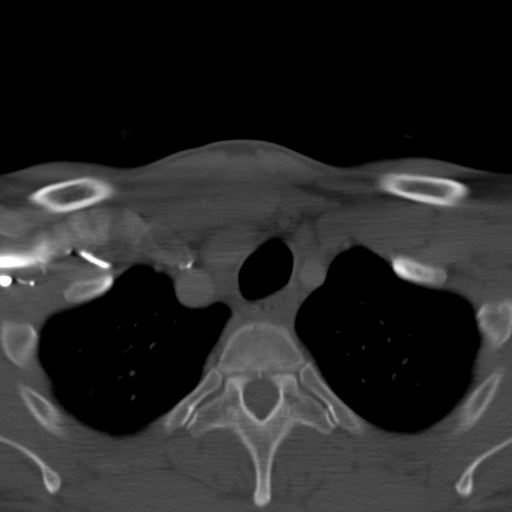
[im 45/90  bone]
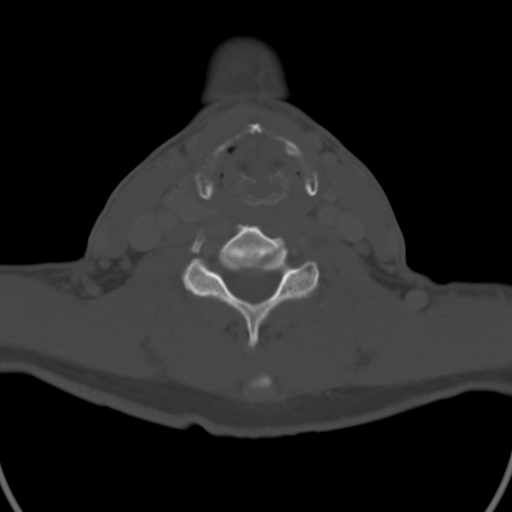
[im 67/90  bone]
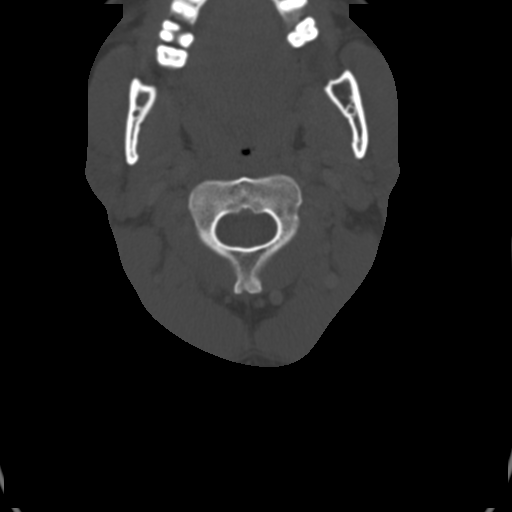

[Series 602: cor · coronal · 0.53mm/px · 3 of 108 slices shown]
[im 22/108  bone]
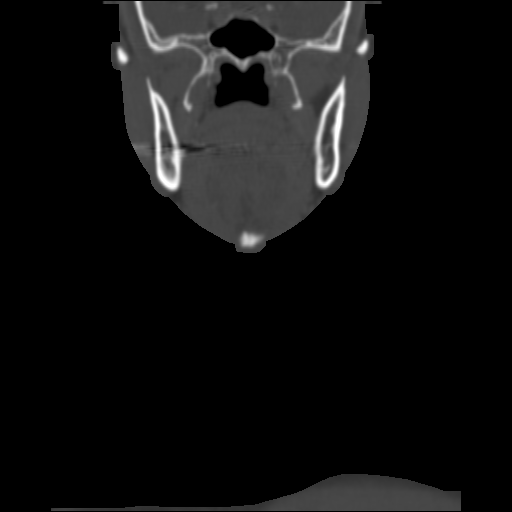
[im 43/108  bone]
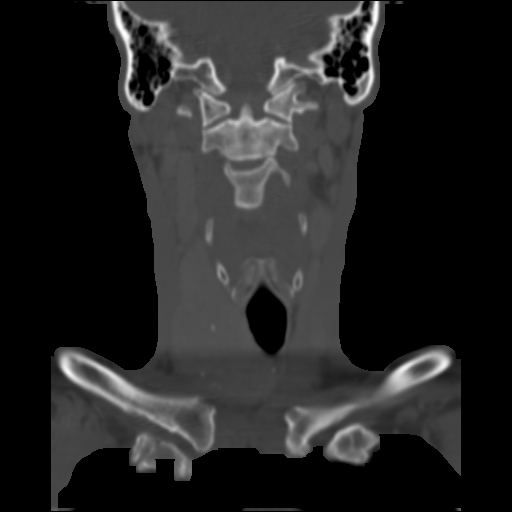
[im 65/108  bone]
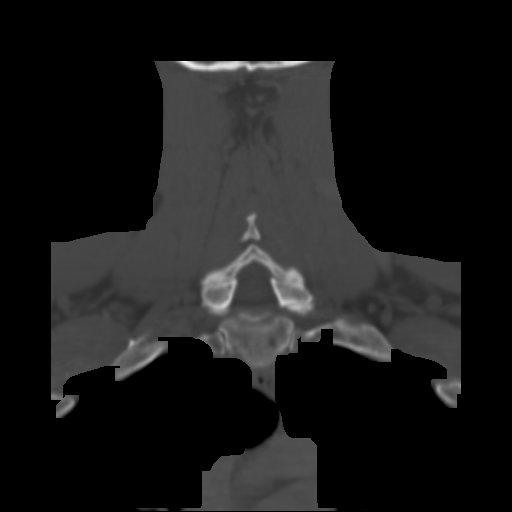

[Series 603: sag · sagittal · 0.53mm/px · 5 of 108 slices shown, 6 images]
[im 36/108  bone]
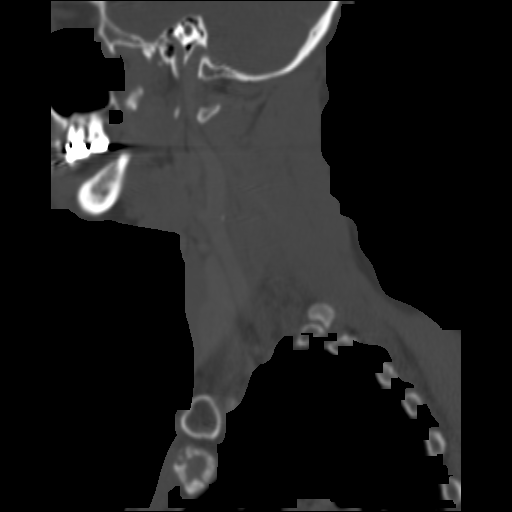
[im 45/108  bone]
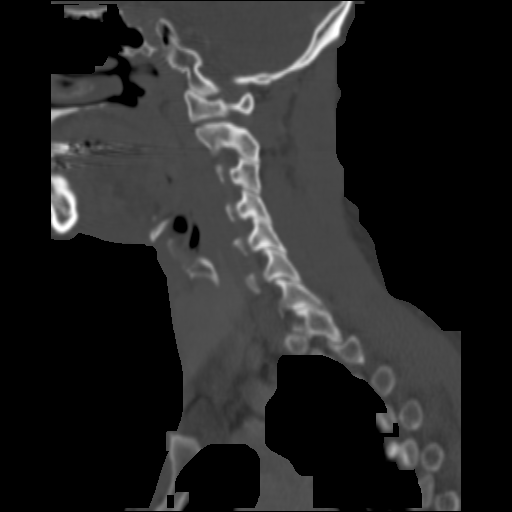
[im 54/108  soft-tissue]
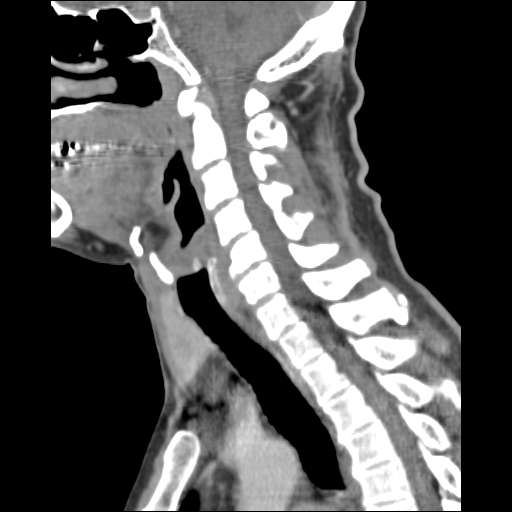
[im 54/108  bone]
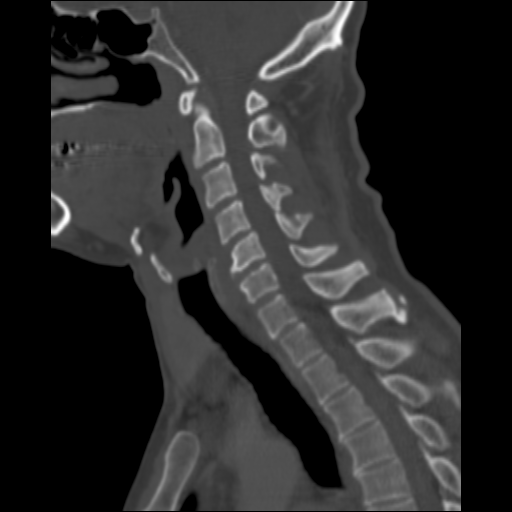
[im 63/108  bone]
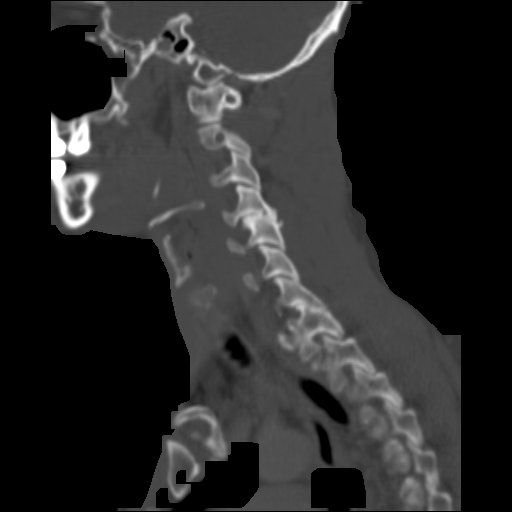
[im 72/108  bone]
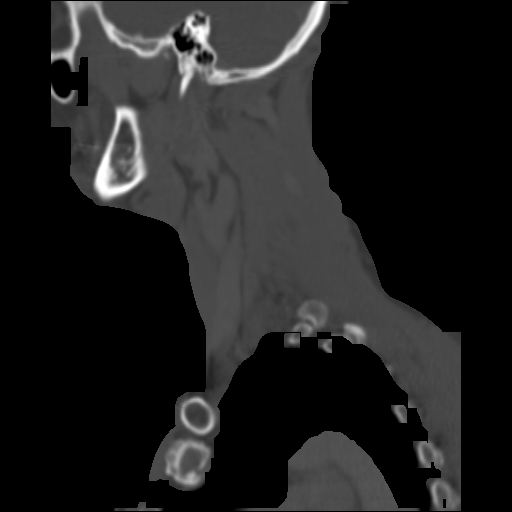

[Series 604: axials · axial · 0.38mm/px · z∈[-266,-105]mm · 5 of 130 slices shown]
[im 22/130  bone]
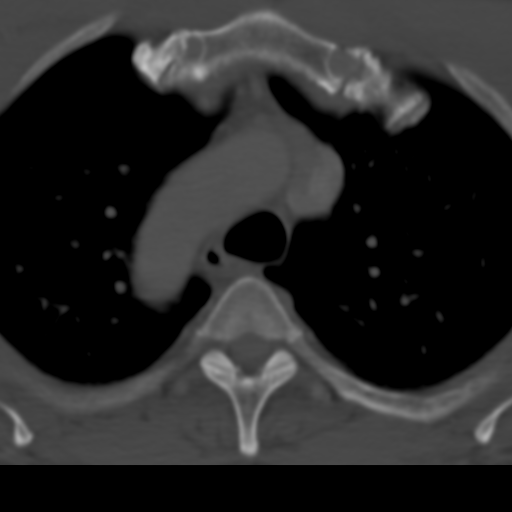
[im 44/130  bone]
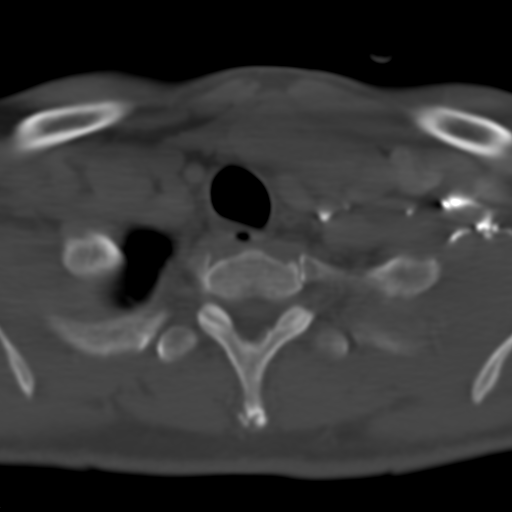
[im 65/130  bone]
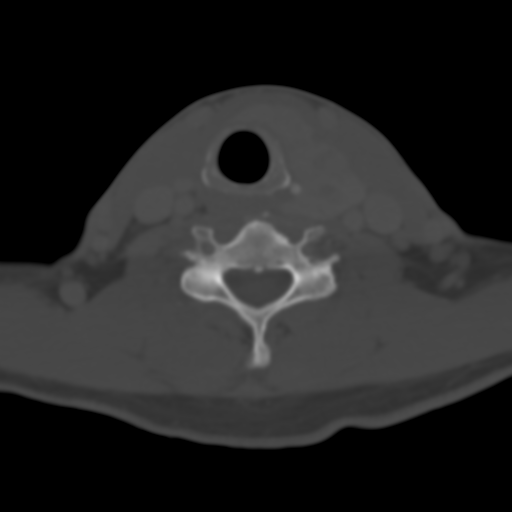
[im 87/130  bone]
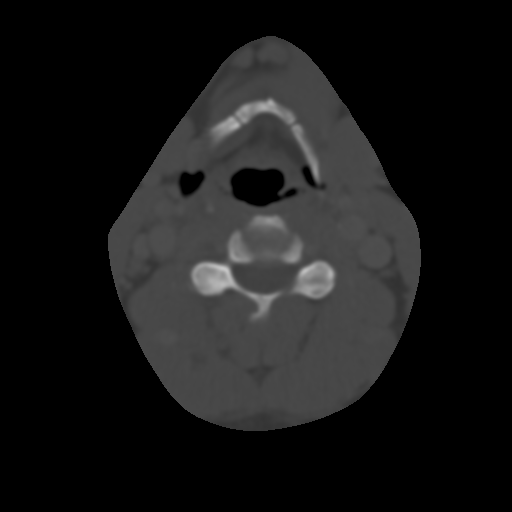
[im 108/130  bone]
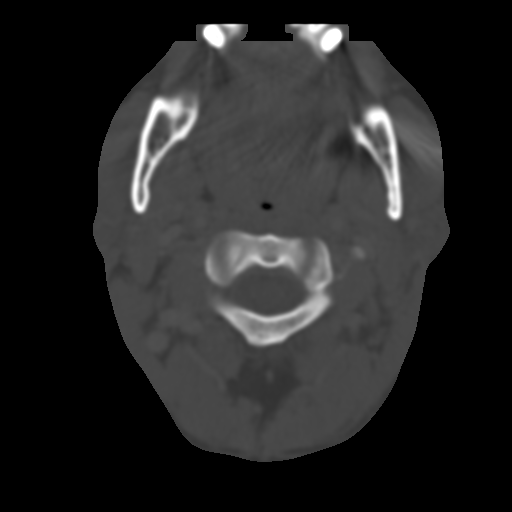

[16 of 33 positions shown; findings below may reference images not displayed]

FINDINGS: Pharynx and larynx: Unremarkable.

Salivary glands: Unremarkable.

Thyroid: Left thyroid lobe is absent, possibly previously resected
or atrophied. There is enlargement of the right thyroid lobe with
numerous low-density areas and calcifications throughout the right
thyroid lobe.

Lymph nodes: Small scattered lymph nodes bilaterally, none
pathologically enlarged.

Vascular: Vessels widely patent. Calcifications noted in the right
carotid bulb.

Limited intracranial: Unremarkable.

Visualized orbits: Unremarkable.

Mastoids and visualized paranasal sinuses: Clear.

Skeleton: No acute bony abnormality.

Upper chest: Lung apices clear.

Other: BB overlies the right submandibular gland which is
unremarkable.
IMPRESSION: Enlarged, heterogeneous right thyroid lobe with low-density areas
and calcifications. This could reflect multinodular goiter. This
could be further evaluated with elective thyroid ultrasound.

No other cervical mass.

BB overlies the right submandibular gland which is unremarkable.

## 2014-11-15 MED ORDER — IOHEXOL 300 MG/ML  SOLN
75.0000 mL | Freq: Once | INTRAMUSCULAR | Status: AC | PRN
  Administered 2014-11-15: 75 mL via INTRAVENOUS

## 2014-11-15 NOTE — Telephone Encounter (Signed)
Pt scheduled for 11/15/14.

## 2014-11-16 ENCOUNTER — Telehealth: Payer: Self-pay | Admitting: Internal Medicine

## 2014-11-16 DIAGNOSIS — R9389 Abnormal findings on diagnostic imaging of other specified body structures: Secondary | ICD-10-CM

## 2014-11-16 NOTE — Telephone Encounter (Signed)
-----   Message from Lyman Bishop, [place] sent at 11/15/2014  6:41 PM EDT ----- Pt advised and does want referral

## 2014-11-16 NOTE — Telephone Encounter (Signed)
Referral done

## 2014-11-21 ENCOUNTER — Ambulatory Visit (INDEPENDENT_AMBULATORY_CARE_PROVIDER_SITE_OTHER): Payer: 59 | Admitting: Internal Medicine

## 2014-11-21 ENCOUNTER — Encounter: Payer: Self-pay | Admitting: Internal Medicine

## 2014-11-21 VITALS — BP 116/78 | HR 72 | Temp 98.7°F | Ht 73.0 in | Wt 181.0 lb

## 2014-11-21 DIAGNOSIS — R221 Localized swelling, mass and lump, neck: Secondary | ICD-10-CM

## 2014-11-21 DIAGNOSIS — Z23 Encounter for immunization: Secondary | ICD-10-CM | POA: Diagnosis not present

## 2014-11-21 DIAGNOSIS — Z0000 Encounter for general adult medical examination without abnormal findings: Secondary | ICD-10-CM

## 2014-11-21 DIAGNOSIS — R634 Abnormal weight loss: Secondary | ICD-10-CM | POA: Diagnosis not present

## 2014-11-21 DIAGNOSIS — Z720 Tobacco use: Secondary | ICD-10-CM | POA: Diagnosis not present

## 2014-11-21 DIAGNOSIS — E119 Type 2 diabetes mellitus without complications: Secondary | ICD-10-CM

## 2014-11-21 NOTE — Assessment & Plan Note (Signed)
Also for cxr, urged to quit smoking

## 2014-11-21 NOTE — Assessment & Plan Note (Signed)
stable overall by history and exam, recent data reviewed with pt, and pt to continue medical treatment as before,  to f/u any worsening symptoms or concerns Lab Results  Component Value Date   HGBA1C 5.7 11/15/2014   Also for podiatry referral

## 2014-11-21 NOTE — Patient Instructions (Addendum)
You had the Pneumovax shot today  Please continue all other medications as before, and refills have been done if requested.  Please have the pharmacy call with any other refills you may need.  Please continue your efforts at being more active, low cholesterol diet, and weight control.  You are otherwise up to date with prevention measures today.  Please keep your appointments with your specialists as you may have planned  Pleas- for the routine Hep C testing  Please go to the XRAY Department in the Basement (go straight as you get off the elevator) for the x-ray testing such as tomorrow or after (no appointment needed)  Please stop smoking  You will be contacted regarding the referral for: podiatry (foot doctor)  Please return in 6 months, or sooner if needed, with Lab testing done 3-5 days before - the routine Hep C testing can be done at that time

## 2014-11-21 NOTE — Addendum Note (Signed)
Addended by: Lyman Bishop on: 11/21/2014 05:40 PM   Modules accepted: Orders

## 2014-11-21 NOTE — Progress Notes (Signed)
Pre visit review using our clinic review tool, if applicable. No additional management support is needed unless otherwise documented below in the visit note. 

## 2014-11-21 NOTE — Progress Notes (Signed)
Subjective:    Patient ID: [name], male    DOB: January 22, 1961, 54 y.o.   MRN: [id number]  HPI  Here for wellness and f/u;  Overall doing ok;  Pt denies Chest pain, worsening SOB, DOE, wheezing, orthopnea, PND, worsening LE edema, palpitations, dizziness or syncope.  Pt denies neurological change such as new headache, facial or extremity weakness.  Pt denies polydipsia, polyuria, or low sugar symptoms. Pt states overall good compliance with treatment and medications, good tolerability, and has been trying to follow appropriate diet.  Pt denies worsening depressive symptoms, suicidal ideation or panic. No fever, night sweats, wt loss, loss of appetite, or other constitutional symptoms.  Pt states good ability with ADL's, has low fall risk, home safety reviewed and adequate, no other significant changes in hearing or vision, and only occasionally active with exercise. Still smoking, not ready to quit, due for Hep C screen, referral podiatry.  Also lost several lbs recently , appetite ok. Wt Readings from Last 3 Encounters:  11/21/14 181 lb (82.101 kg)  11/09/14 181 lb (82.101 kg)  05/30/14 189 lb 0.6 oz (85.748 kg)   Past Medical History  Diagnosis Date  . Hypertension   . Hyperlipidemia   . Diabetes mellitus     type II  . LVH (left ventricular hypertrophy)     with systolic dysfunction by echo 10/03, EF 45-50% - Dr. Melvern Banker  . OSA (obstructive sleep apnea)   . DIABETES MELLITUS, TYPE II 08/29/2009  . HYPERLIPIDEMIA 08/29/2009  . HYPERTENSION 01/14/2008  . SLEEP APNEA, OBSTRUCTIVE 01/14/2008  . GERD (gastroesophageal reflux disease) 08/12/2010  . Chest pain 02/13/2012    echo - EF >55%;mod concentric L ventricular hypertrophy; atrial septum aneurysmal; patent foramen ovale suspected on color flow, LA mil/mod dilation; RV systolic pressure [id number]  . Chest pain 06/09/2007    echo - EF 45-50%; interarterial shunt; mild valvular regurgitation;   . Chest pain 06/09/2007    Myoview - EF 53%;  normal perfusion all regions;EKG negative for ischemia   . Nonspecific ST-T wave electrocardiographic changes 08/11/2006    Myoview - EF 49%; normal perfusion all regions, EKG negative for ischemia  . Lightheadedness 05/10/2002    30d monitor unremarkable  . Sleep apnea 08/13/2008    AHI 1.2; sleep study 10/2007 - AHI 9.47at 15cm H2O and 31.3 at 16cm H2O  . Hemorrhoids   . History of anal fissures    Past Surgical History  Procedure Laterality Date  . Anal sphincterotomy  08/20/11  . Cardiac catheterization  10/23/2008    normal coronary arteries and LV function  . Colonoscopy    . Polypectomy      reports that he has been smoking Cigarettes.  He has been smoking about 0.50 packs per day. He has never used smokeless tobacco. He reports that he drinks about 1.5 oz of alcohol per week. He reports that he does not use illicit drugs. family history includes Heart disease in his father; Hypertension in his mother. There is no history of Colon cancer. Allergies  Allergen Reactions  . Sulfa Antibiotics Hives and Swelling  . Vicodin [Hydrocodone-Acetaminophen] Itching   Current Outpatient Prescriptions on File Prior to Visit  Medication Sig Dispense Refill  . Acetaminophen-Codeine (TYLENOL/CODEINE #3) 300-30 MG per tablet Take 1 tablet by mouth every 6 (six) hours as needed for pain. 40 tablet 0  . amLODipine-benazepril (LOTREL) 10-40 MG per capsule TAKE 1 CAPSULE BY MOUTH DAILY. 90 capsule 1  . aspirin 81 MG EC tablet  Take 81 mg by mouth daily.      Marland Kitchen BYSTOLIC 10 MG tablet TAKE 4 TABLETS (40MG  TOTAL) BY MOUTH DAILY. 120 tablet 5  . chlorthalidone (HYGROTON) 25 MG tablet TAKE 1 TABLET BY MOUTH EVERY MORNING 30 tablet 10  . Docusate Calcium ([employer] STOOL SOFTENER PO) Take 2 tablets by mouth 2 (two) times daily.     . hydrALAZINE (APRESOLINE) 50 MG tablet TAKE 1 TABLET BY MOUTH 3 TIMES A DAY 270 tablet 0  . ibuprofen (ADVIL,MOTRIN) 200 MG tablet Take 400 mg by mouth every 6 (six) hours as needed. For  pain    . JANUVIA 100 MG tablet TAKE 1 TABLET (100 MG TOTAL) BY MOUTH DAILY. 90 tablet 1  . Lancets (ONETOUCH ULTRASOFT) lancets TEST AS DIRECTED 100 each 0  . metFORMIN (GLUCOPHAGE) 500 MG tablet TAKE 2 TABLETS BY MOUTH TWICE DAILY 120 tablet 5  . nebivolol (BYSTOLIC) 10 MG tablet Take 20 mg by mouth 2 (two) times daily.    Marland Kitchen omeprazole (PRILOSEC) 20 MG capsule TAKE 1 CAPSULE (20 MG TOTAL) BY MOUTH DAILY. 30 capsule 11  . ONE TOUCH ULTRA TEST test strip TEST THREE TIMES A DAY AS INSTRUCTED. 100 each 1  . potassium chloride (KLOR-CON 10) 10 MEQ tablet Take 1 tablet (10 mEq total) by mouth daily. 90 tablet 3  . pravastatin (PRAVACHOL) 40 MG tablet TAKE 1 TABLET (40 MG TOTAL) BY MOUTH DAILY. 90 tablet 0  . amoxicillin-clavulanate (AUGMENTIN) 875-125 MG per tablet Take 1 tablet by mouth 2 (two) times daily. (Patient not taking: Reported on 11/21/2014) 20 tablet 0   No current facility-administered medications on file prior to visit.   Review of Systems Constitutional: Negative for increased diaphoresis, other activity, appetite or siginficant weight change other than noted HENT: Negative for worsening hearing loss, ear pain, facial swelling, mouth sores and neck stiffness.   Eyes: Negative for other worsening pain, redness or visual disturbance.  Respiratory: Negative for shortness of breath and wheezing  Cardiovascular: Negative for chest pain and palpitations.  Gastrointestinal: Negative for diarrhea, blood in stool, abdominal distention or other pain Genitourinary: Negative for hematuria, flank pain or change in urine volume.  Musculoskeletal: Negative for myalgias or other joint complaints.  Skin: Negative for color change and wound or drainage.  Neurological: Negative for syncope and numbness. other than noted Hematological: Negative for adenopathy. or other swelling Psychiatric/Behavioral: Negative for hallucinations, SI, self-injury, decreased concentration or other worsening agitation.        Objective:   Physical Exam BP 116/78 mmHg  Pulse 72  Temp(Src) 98.7 F (37.1 C) (Oral)  Ht 6\' 1"  (1.854 m)  Wt 181 lb (82.101 kg)  BMI 23.89 kg/m2  SpO2 96% VS noted,  Constitutional: Pt is oriented to person, place, and time. Appears well-developed and well-nourished, in no significant distress Head: Normocephalic and atraumatic.  Right Ear: External ear normal.  Left Ear: External ear normal.  Nose: Nose normal.  Mouth/Throat: Oropharynx is clear and moist.  Eyes: Conjunctivae and EOM are normal. Pupils are equal, round, and reactive to light.  Neck: Normal range of motion. Neck supple. No JVD present. No tracheal deviation present or significant neck LA or mass Cardiovascular: Normal rate, regular rhythm, normal heart sounds and intact distal pulses.   Pulmonary/Chest: Effort normal and breath sounds without rales or wheezing  Abdominal: Soft. Bowel sounds are normal. NT. No HSM  Musculoskeletal: Normal range of motion. Exhibits no edema.  Lymphadenopathy:  Has no cervical adenopathy.  Neurological:  Pt is alert and oriented to person, place, and time. Pt has normal reflexes. No cranial nerve deficit. Motor grossly intact Skin: Skin is warm and dry. No rash noted.  Psychiatric:  Has normal mood and affect. Behavior is normal.      Assessment & Plan:

## 2014-11-21 NOTE — Assessment & Plan Note (Signed)
Possible multinod goiter - has been referred to endo

## 2014-11-21 NOTE — Assessment & Plan Note (Signed)

## 2014-11-21 NOTE — Assessment & Plan Note (Signed)
Urged to quit 

## 2014-12-01 ENCOUNTER — Ambulatory Visit (INDEPENDENT_AMBULATORY_CARE_PROVIDER_SITE_OTHER): Payer: 59 | Admitting: Endocrinology

## 2014-12-01 ENCOUNTER — Encounter: Payer: Self-pay | Admitting: Endocrinology

## 2014-12-01 ENCOUNTER — Ambulatory Visit (INDEPENDENT_AMBULATORY_CARE_PROVIDER_SITE_OTHER)
Admission: RE | Admit: 2014-12-01 | Discharge: 2014-12-01 | Disposition: A | Discharge status: Home / Self Care | Payer: 59 | Source: Ambulatory Visit | Attending: Internal Medicine | Admitting: Internal Medicine

## 2014-12-01 VITALS — BP 118/70 | HR 97 | Temp 98.5°F | Ht 73.0 in | Wt 183.0 lb

## 2014-12-01 DIAGNOSIS — E079 Disorder of thyroid, unspecified: Secondary | ICD-10-CM | POA: Diagnosis not present

## 2014-12-01 DIAGNOSIS — R634 Abnormal weight loss: Secondary | ICD-10-CM

## 2014-12-01 DIAGNOSIS — Z720 Tobacco use: Secondary | ICD-10-CM

## 2014-12-01 IMAGING — CR DG CHEST 2V
2 series · 2 of 2 positions shown · non-contrast
Comparison: Two-view chest x-ray [DATE]

CLINICAL DATA: Routine exam.  Smoking history.  Weight loss.

EXAM:
CHEST - 2 VIEW

[view not recorded (1 of 2)]
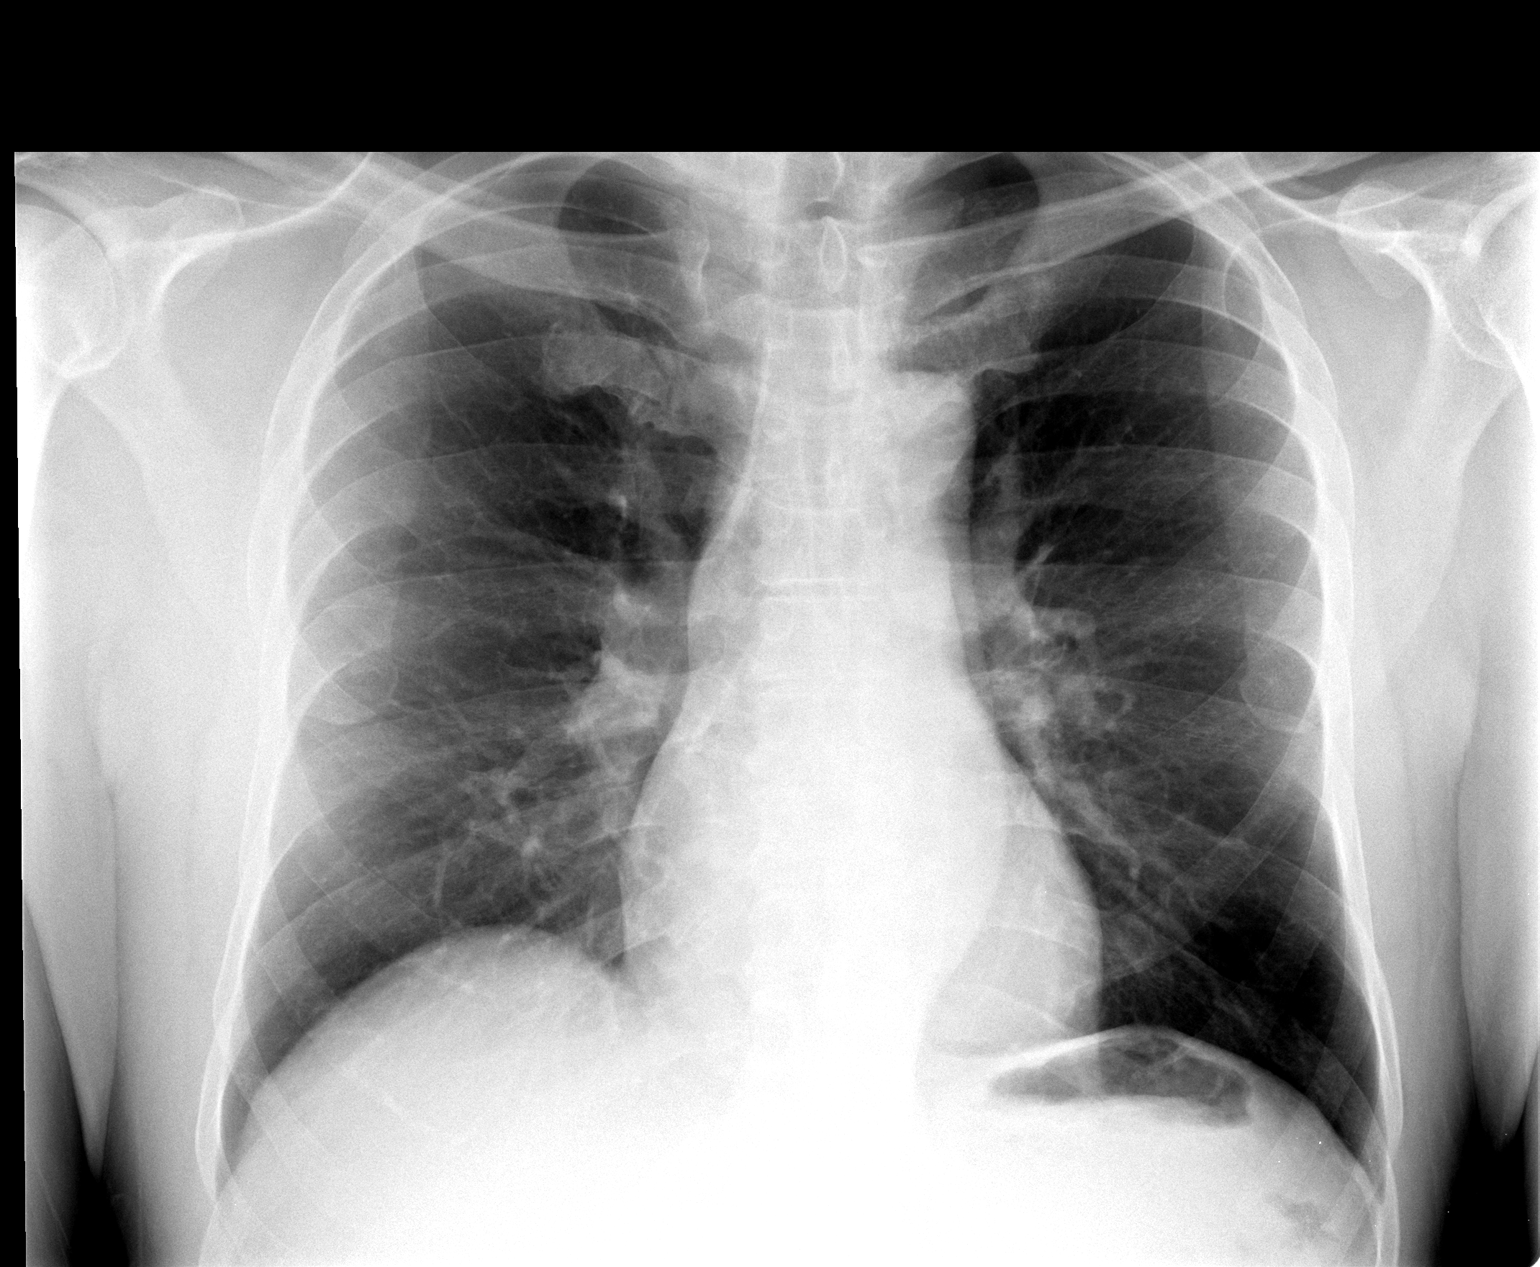

[view not recorded (2 of 2)]
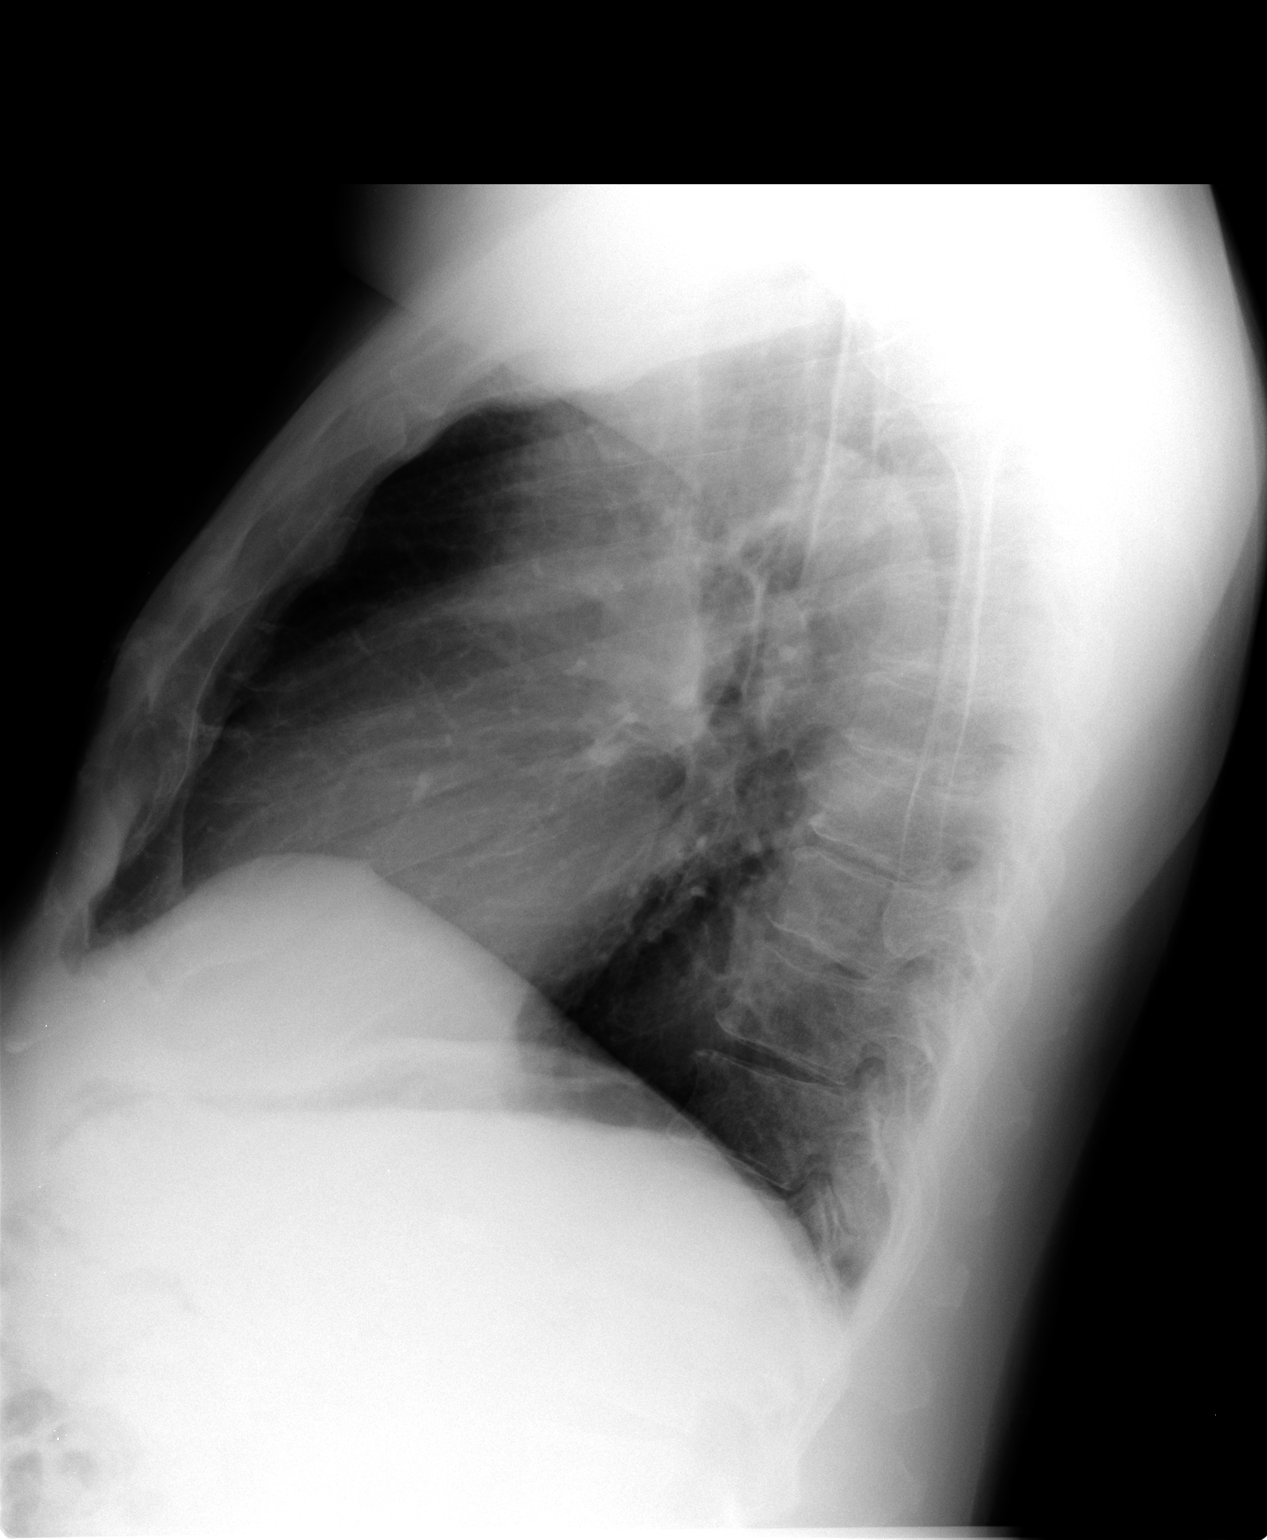

[2 of 2 positions shown; findings below may reference images not displayed]

FINDINGS: Heart size is normal. Mild prominence of the pulmonary hila is
stable. The lungs are clear. No focal airspace opacity is present.
There is no edema or effusion to suggest failure. Mild emphysematous
changes are present. The visualized soft tissues and bony thorax are
unremarkable.
IMPRESSION: 1. Mild emphysematous changes are suggested.
2. Chronic prominence the pulmonary hila, potentially related to
pulmonary arterial hypertension.
3. No acute abnormality.

## 2014-12-01 NOTE — Patient Instructions (Addendum)
Please check the thyroid nuclear medicine test (needs to be after October 17, due to the iodine in the CT scan).  you will receive a phone call, about a day and time for an appointment. It is expected that the enlarged right thyroid is slightly overactive.  If this is the case, cancer is very unlikely, so then please come back for a follow-up appointment in 6 months.

## 2014-12-01 NOTE — Progress Notes (Signed)
Subjective:    Patient ID: [name], male    DOB: 02/20/1961, 54 y.o.   MRN: [id number]  HPI Pt states 6 mos of intermittent fullness at the right lateral neck, but no assoc pain.  He says it might have improved twice with abx.  He is unaware of ever having had thyroid problems in the past.  He has no h/o XRT or surgery to the neck.  he has never been on thyroid therapy.  He does not consume kelp or any other prescribed or non-prescribed thyroid medication.  He has never been on amiodarone.   Past Medical History  Diagnosis Date  . Hypertension   . Hyperlipidemia   . Diabetes mellitus     type II  . LVH (left ventricular hypertrophy)     with systolic dysfunction by echo 10/03, EF 45-50% - Dr. Melvern Banker  . OSA (obstructive sleep apnea)   . DIABETES MELLITUS, TYPE II 08/29/2009  . HYPERLIPIDEMIA 08/29/2009  . HYPERTENSION 01/14/2008  . SLEEP APNEA, OBSTRUCTIVE 01/14/2008  . GERD (gastroesophageal reflux disease) 08/12/2010  . Chest pain 02/13/2012    echo - EF >55%;mod concentric L ventricular hypertrophy; atrial septum aneurysmal; patent foramen ovale suspected on color flow, LA mil/mod dilation; RV systolic pressure [id number]  . Chest pain 06/09/2007    echo - EF 45-50%; interarterial shunt; mild valvular regurgitation;   . Chest pain 06/09/2007    Myoview - EF 53%; normal perfusion all regions;EKG negative for ischemia   . Nonspecific ST-T wave electrocardiographic changes 08/11/2006    Myoview - EF 49%; normal perfusion all regions, EKG negative for ischemia  . Lightheadedness 05/10/2002    30d monitor unremarkable  . Sleep apnea 08/13/2008    AHI 1.2; sleep study 10/2007 - AHI 9.47at 15cm H2O and 31.3 at 16cm H2O  . Hemorrhoids   . History of anal fissures     Past Surgical History  Procedure Laterality Date  . Anal sphincterotomy  08/20/11  . Cardiac catheterization  10/23/2008    normal coronary arteries and LV function  . Colonoscopy    . Polypectomy      Social History    Social History  . Marital Status: Single    Spouse Name: N/A  . Number of Children: 1  . Years of Education: N/A   Occupational History  . work as truck Environmental health practitioner Tobacco   Social History Main Topics  . Smoking status: Current Every Day Smoker -- 0.50 packs/day    Types: Cigarettes  . Smokeless tobacco: Never Used  . Alcohol Use: 1.5 oz/week    3 drink(s) per week     Comment: occaisionally  . Drug Use: No  . Sexual Activity: Not on file   Other Topics Concern  . Not on file   Social History Narrative    Current Outpatient Prescriptions on File Prior to Visit  Medication Sig Dispense Refill  . amLODipine-benazepril (LOTREL) 10-40 MG per capsule TAKE 1 CAPSULE BY MOUTH DAILY. 90 capsule 1  . aspirin 81 MG EC tablet Take 81 mg by mouth daily.      Marland Kitchen BYSTOLIC 10 MG tablet TAKE 4 TABLETS (40MG  TOTAL) BY MOUTH DAILY. 120 tablet 5  . chlorthalidone (HYGROTON) 25 MG tablet TAKE 1 TABLET BY MOUTH EVERY MORNING 30 tablet 10  . Docusate Calcium ([employer] STOOL SOFTENER PO) Take 2 tablets by mouth 2 (two) times daily.     . hydrALAZINE (APRESOLINE) 50 MG tablet TAKE 1 TABLET BY MOUTH 3 TIMES  A DAY 270 tablet 0  . ibuprofen (ADVIL,MOTRIN) 200 MG tablet Take 400 mg by mouth every 6 (six) hours as needed. For pain    . JANUVIA 100 MG tablet TAKE 1 TABLET (100 MG TOTAL) BY MOUTH DAILY. 90 tablet 1  . Lancets (ONETOUCH ULTRASOFT) lancets TEST AS DIRECTED 100 each 0  . metFORMIN (GLUCOPHAGE) 500 MG tablet TAKE 2 TABLETS BY MOUTH TWICE DAILY 120 tablet 5  . nebivolol (BYSTOLIC) 10 MG tablet Take 20 mg by mouth 2 (two) times daily.    Marland Kitchen omeprazole (PRILOSEC) 20 MG capsule TAKE 1 CAPSULE (20 MG TOTAL) BY MOUTH DAILY. 30 capsule 11  . ONE TOUCH ULTRA TEST test strip TEST THREE TIMES A DAY AS INSTRUCTED. 100 each 1  . potassium chloride (KLOR-CON 10) 10 MEQ tablet Take 1 tablet (10 mEq total) by mouth daily. 90 tablet 3  . pravastatin (PRAVACHOL) 40 MG tablet TAKE 1 TABLET (40 MG TOTAL) BY  MOUTH DAILY. 90 tablet 0  . Acetaminophen-Codeine (TYLENOL/CODEINE #3) 300-30 MG per tablet Take 1 tablet by mouth every 6 (six) hours as needed for pain. (Patient not taking: Reported on 12/01/2014) 40 tablet 0  . amoxicillin-clavulanate (AUGMENTIN) 875-125 MG per tablet Take 1 tablet by mouth 2 (two) times daily. (Patient not taking: Reported on 11/21/2014) 20 tablet 0   No current facility-administered medications on file prior to visit.    Allergies  Allergen Reactions  . Sulfa Antibiotics Hives and Swelling  . Vicodin [Hydrocodone-Acetaminophen] Itching    Family History  Problem Relation Age of Onset  . Hypertension Mother   . Heart disease Father   . Colon cancer Neg Hx   . Thyroid disease Neg Hx     BP 118/70 mmHg  Pulse 97  Temp(Src) 98.5 F (36.9 C) (Oral)  Ht 6\' 1"  (1.854 m)  Wt 183 lb (83.008 kg)  BMI 24.15 kg/m2  SpO2 97%  Review of Systems denies headache, hoarseness, double vision, palpitations, sob, diarrhea, polyuria, muscle weakness, excessive diaphoresis, tremor, anxiety, heat intolerance, easy bruising, and rhinorrhea.  He has lost 85 lbs x 4 years (10 lbs x 1 month).      Objective:   Physical Exam VS: see vs page GEN: no distress HEAD: head: no deformity eyes: no periorbital swelling, no proptosis external nose and ears are normal mouth: no lesion seen NECK: the right thyroid mass is easily palpable.  CHEST WALL: no deformity LUNGS: clear to auscultation BREASTS:  No gynecomastia CV: reg rate and rhythm, no murmur ABD: abdomen is soft, nontender.  no hepatosplenomegaly.  not distended.  no hernia MUSCULOSKELETAL: muscle bulk and strength are grossly normal.  no obvious joint swelling.  gait is normal and steady EXTEMITIES: no deformity.  no edema PULSES: no carotid bruit NEURO:  cn 2-12 grossly intact.   readily moves all 4's.  sensation is intact to touch on all 4's.  No tremor SKIN:  Normal texture and temperature.  No rash or suspicious lesion  is visible.  Not diaphoretic.  NODES:  None palpable at the neck.   PSYCH: alert, well-oriented.  Does not appear anxious nor depressed.   Lab Results  Component Value Date   TSH 0.54 11/15/2014   radiol: neck CT (11/15/14): Left thyroid lobe is resected or atrophied. There is enlargement of the right thyroid lobe with numerous low-density areas and calcifications throughout the right thyroid lobe.    Assessment & Plan:  Multinodular goiter, new to me.   Intermittently suppressed TSH: this  makes malignancy within the nodule low enough to defer bx for now.   Weight loss: not thyroid-related--more likely related to DM, but we'll follow.    Patient is advised the following: Patient Instructions  Please check the thyroid nuclear medicine test (needs to be after October 17, due to the iodine in the CT scan).  you will receive a phone call, about a day and time for an appointment. It is expected that the enlarged right thyroid is slightly overactive.  If this is the case, cancer is very unlikely, so then please come back for a follow-up appointment in 6 months.

## 2014-12-05 ENCOUNTER — Other Ambulatory Visit: Payer: Self-pay | Admitting: Cardiovascular Disease

## 2014-12-12 ENCOUNTER — Ambulatory Visit (INDEPENDENT_AMBULATORY_CARE_PROVIDER_SITE_OTHER): Payer: 59 | Admitting: Cardiovascular Disease

## 2014-12-12 ENCOUNTER — Encounter: Payer: Self-pay | Admitting: Cardiovascular Disease

## 2014-12-12 VITALS — BP 118/66 | HR 60 | Ht 73.0 in | Wt 177.0 lb

## 2014-12-12 DIAGNOSIS — I10 Essential (primary) hypertension: Secondary | ICD-10-CM | POA: Diagnosis not present

## 2014-12-12 DIAGNOSIS — Z720 Tobacco use: Secondary | ICD-10-CM | POA: Diagnosis not present

## 2014-12-12 NOTE — Patient Instructions (Signed)
Medication Instructions:  Your physician recommends that you continue on your current medications as directed. Please refer to the Current Medication list given to you today.   Labwork: NONE  Testing/Procedures: NONE  Follow-Up: Your physician wants you to follow-up in: 12 months with Dr. Gwenlyn Found. You will receive a reminder letter in the mail two months in advance. If you don't receive a letter, please call our office to schedule the follow-up appointment.   Any Other Special Instructions Will Be Listed Below (If Applicable).

## 2014-12-12 NOTE — Assessment & Plan Note (Signed)
The patient continues to smoke one pack per day recalcitrant to risk factor modification

## 2014-12-12 NOTE — Assessment & Plan Note (Signed)
History of hypertension and blood pressure measured at 118/66. He is on amlodipine, benazepril, and hydralazine. Continue current meds at current dosing

## 2014-12-12 NOTE — Assessment & Plan Note (Signed)
History of hyperlipidemia on pravastatin with recent lipid profile performed 11/15/14 revealing total cholesterol 136, LDL 70 and HDL of 52

## 2014-12-12 NOTE — Progress Notes (Signed)
12/12/2014 [name]   [birth year]  [id number]  Primary Physician Cathlean Cower, MD Primary Cardiologist: Lorretta Harp MD Renae Gloss   HPI:  The patient is a 54 year old, mildly overweight, single [place] American male, father of 21, grandfather to 2 grandchildren who works as a Administrator for [employer]. I saw him a year ago. His risk factors include tobacco abuse, hypertension and dyslipidemia. I catheterized him radially October 23, 2008, revealing normal coronary arteries and normal LV function. I felt his chest pain symptoms were related to reflux. Dr. Jenny Reichmann has been adjusting antihypertensive medications. He is aware of salt intake. He does have significant LVH on his EKG with a past echo performed 2 years ago revealed moderate concentric LVH with normal LV function..Since I saw him a year ago he is completely asymptomatic. His blood pressure is in the much better control and the addition of hydralazine. His most recent lipid profile performed 11/15/14 revealed a total cholesterol 136, LDL 70 HDL of 52.   Current Outpatient Prescriptions  Medication Sig Dispense Refill  . Acetaminophen-Codeine (TYLENOL/CODEINE #3) 300-30 MG per tablet Take 1 tablet by mouth every 6 (six) hours as needed for pain. 40 tablet 0  . amLODipine-benazepril (LOTREL) 10-40 MG per capsule TAKE 1 CAPSULE BY MOUTH DAILY. 90 capsule 1  . aspirin 81 MG EC tablet Take 81 mg by mouth daily.      . chlorthalidone (HYGROTON) 25 MG tablet TAKE 1 TABLET BY MOUTH EVERY MORNING 30 tablet 1  . Docusate Calcium ([employer]S STOOL SOFTENER PO) Take 2 tablets by mouth 2 (two) times daily.     . hydrALAZINE (APRESOLINE) 50 MG tablet TAKE 1 TABLET BY MOUTH 3 TIMES A DAY 270 tablet 0  . ibuprofen (ADVIL,MOTRIN) 200 MG tablet Take 400 mg by mouth every 6 (six) hours as needed. For pain    . JANUVIA 100 MG tablet TAKE 1 TABLET (100 MG TOTAL) BY MOUTH DAILY. 90 tablet 1  . Lancets (ONETOUCH ULTRASOFT) lancets TEST AS  DIRECTED 100 each 0  . metFORMIN (GLUCOPHAGE) 500 MG tablet TAKE 2 TABLETS BY MOUTH TWICE DAILY 120 tablet 5  . nebivolol (BYSTOLIC) 10 MG tablet Take 20 mg by mouth 2 (two) times daily.    Marland Kitchen omeprazole (PRILOSEC) 20 MG capsule TAKE 1 CAPSULE (20 MG TOTAL) BY MOUTH DAILY. 30 capsule 11  . ONE TOUCH ULTRA TEST test strip TEST THREE TIMES A DAY AS INSTRUCTED. 100 each 1  . potassium chloride (KLOR-CON 10) 10 MEQ tablet Take 1 tablet (10 mEq total) by mouth daily. 90 tablet 3  . pravastatin (PRAVACHOL) 40 MG tablet TAKE 1 TABLET (40 MG TOTAL) BY MOUTH DAILY. 90 tablet 0   No current facility-administered medications for this visit.    Allergies  Allergen Reactions  . Sulfa Antibiotics Hives and Swelling  . Vicodin [Hydrocodone-Acetaminophen] Itching    Social History   Social History  . Marital Status: Single    Spouse Name: N/A  . Number of Children: 1  . Years of Education: N/A   Occupational History  . work as truck Environmental health practitioner Tobacco   Social History Main Topics  . Smoking status: Current Every Day Smoker -- 0.50 packs/day    Types: Cigarettes  . Smokeless tobacco: Never Used  . Alcohol Use: 1.5 oz/week    3 drink(s) per week     Comment: occaisionally  . Drug Use: No  . Sexual Activity: Not on file   Other Topics Concern  .  Not on file   Social History Narrative     Review of Systems: General: negative for chills, fever, night sweats or weight changes.  Cardiovascular: negative for chest pain, dyspnea on exertion, edema, orthopnea, palpitations, paroxysmal nocturnal dyspnea or shortness of breath Dermatological: negative for rash Respiratory: negative for cough or wheezing Urologic: negative for hematuria Abdominal: negative for nausea, vomiting, diarrhea, bright red blood per rectum, melena, or hematemesis Neurologic: negative for visual changes, syncope, or dizziness All other systems reviewed and are otherwise negative except as noted above.    Blood  pressure 118/66, pulse 60, height 6\' 1"  (1.854 m), weight 177 lb (80.287 kg).  General appearance: alert and no distress Neck: no adenopathy, no carotid bruit, no JVD, supple, symmetrical, trachea midline and thyroid not enlarged, symmetric, no tenderness/mass/nodules Lungs: clear to auscultation bilaterally Heart: regular rate and rhythm, S1, S2 normal, no murmur, click, rub or gallop Extremities: extremities normal, atraumatic, no cyanosis or edema Pulses: 2+ and symmetric Skin: Skin color, texture, turgor normal. No rashes or lesions Neurologic: Grossly normal  EKG: normal sinus rhythm at 60 with evidence of LVH. I personally reviewed this EKG  ASSESSMENT AND PLAN:   Tobacco abuse The patient continues to smoke one pack per day recalcitrant to risk factor modification  HYPERLIPIDEMIA History of hyperlipidemia on pravastatin with recent lipid profile performed 11/15/14 revealing total cholesterol 136, LDL 70 and HDL of 52  Essential hypertension History of hypertension and blood pressure measured at 118/66. He is on amlodipine, benazepril, and hydralazine. Continue current meds at current dosing      Lorretta Harp MD [facility], [facility] 12/12/2014 5:06 PM

## 2014-12-14 ENCOUNTER — Encounter: Payer: Self-pay | Admitting: Podiatry

## 2014-12-14 ENCOUNTER — Ambulatory Visit (INDEPENDENT_AMBULATORY_CARE_PROVIDER_SITE_OTHER): Payer: 59 | Admitting: Podiatry

## 2014-12-14 VITALS — BP 110/70 | HR 67 | Resp 12

## 2014-12-14 DIAGNOSIS — M2020 Hallux rigidus, unspecified foot: Principal | ICD-10-CM

## 2014-12-14 DIAGNOSIS — M205X9 Other deformities of toe(s) (acquired), unspecified foot: Secondary | ICD-10-CM

## 2014-12-14 NOTE — Progress Notes (Signed)
   Subjective:    Patient ID: [name], male    DOB: 1961/01/02, 54 y.o.   MRN: [id number]  HPI  This  patient presents here today with B/L diabetic feet exam, was recently diagnosed with diabetes and was referred by his employer to have this completed. This patient presents to the office for diabetic foot exam for this newly diagnosed diabetic.  He says he has no pain or discomfort with his feet.  He presents for evaluation .   Review of Systems  All other systems reviewed and are negative.      Objective:   Physical Exam GENERAL APPEARANCE: Alert, conversant. Appropriately groomed. No acute distress.  VASCULAR: Pedal pulses palpable at  [facility] and PT bilateral.  Capillary refill time is immediate to all digits,  Normal temperature gradient.  Digital hair growth is present bilateral  NEUROLOGIC: sensation is normal to 5.07 monofilament at 5/5 sites bilateral.  Light touch is intact bilateral, Muscle strength normal.  MUSCULOSKELETAL: acceptable muscle strength, tone and stability bilateral.  Intrinsic muscluature intact bilateral.  Rectus appearance of foot and digits noted bilateral. Asymptomatic HAV deformity with dorsal lipping first metatarsal both feet.  DERMATOLOGIC: skin color, texture, and turgor are within normal limits.  No preulcerative lesions or ulcers  are seen, no interdigital maceration noted.  No open lesions present.  Digital nails are asymptomatic. No drainage noted.         Assessment & Plan:  Hallux Limitus 1st MPJ B/l   Diabetes.  IE  Diabetic foot exam.

## 2014-12-18 ENCOUNTER — Other Ambulatory Visit: Payer: Self-pay | Admitting: Internal Medicine

## 2015-01-01 ENCOUNTER — Other Ambulatory Visit: Payer: Self-pay | Admitting: Internal Medicine

## 2015-01-04 ENCOUNTER — Other Ambulatory Visit: Payer: Self-pay | Admitting: Internal Medicine

## 2015-01-12 ENCOUNTER — Other Ambulatory Visit: Payer: Self-pay | Admitting: Internal Medicine

## 2015-01-15 ENCOUNTER — Encounter: Payer: Self-pay | Admitting: Endocrinology

## 2015-01-22 ENCOUNTER — Telehealth: Payer: Self-pay | Admitting: Endocrinology

## 2015-01-22 ENCOUNTER — Encounter (HOSPITAL_COMMUNITY)
Admission: RE | Admit: 2015-01-22 | Discharge: 2015-01-22 | Disposition: A | Discharge status: Home / Self Care | Payer: 59 | Source: Ambulatory Visit | Attending: Endocrinology | Admitting: Endocrinology

## 2015-01-22 DIAGNOSIS — E079 Disorder of thyroid, unspecified: Secondary | ICD-10-CM | POA: Insufficient documentation

## 2015-01-22 MED ORDER — SODIUM IODIDE I 131 CAPSULE
14.0000 | Freq: Once | INTRAVENOUS | Status: AC | PRN
  Administered 2015-01-22: 13.97 via ORAL

## 2015-01-22 NOTE — Telephone Encounter (Signed)
Patient would like to know where do he pick up the pill before his scan, and where is the locaton of the procedure. Please advise

## 2015-01-22 NOTE — Telephone Encounter (Signed)
I contacted the pt and advised that his nuclear medicine test has been scheduled for today and tomorrow at 1pm. Patient was advised to arrive at [facility] at 1230 to get checked in for his test. Patient voiced understanding.

## 2015-01-23 ENCOUNTER — Encounter (HOSPITAL_COMMUNITY)
Admission: RE | Admit: 2015-01-23 | Discharge: 2015-01-23 | Disposition: A | Discharge status: Home / Self Care | Payer: 59 | Source: Ambulatory Visit | Attending: Endocrinology | Admitting: Endocrinology

## 2015-01-23 ENCOUNTER — Other Ambulatory Visit: Payer: Self-pay | Admitting: Endocrinology

## 2015-01-23 DIAGNOSIS — E079 Disorder of thyroid, unspecified: Secondary | ICD-10-CM

## 2015-01-23 MED ORDER — SODIUM PERTECHNETATE TC 99M INJECTION
10.0000 | Freq: Once | INTRAVENOUS | Status: AC | PRN
  Administered 2015-01-23: 10 via INTRAVENOUS

## 2015-02-01 ENCOUNTER — Encounter: Payer: Self-pay | Admitting: Endocrinology

## 2015-02-03 ENCOUNTER — Other Ambulatory Visit: Payer: Self-pay | Admitting: Internal Medicine

## 2015-02-14 ENCOUNTER — Encounter: Payer: Self-pay | Admitting: Endocrinology

## 2015-02-14 ENCOUNTER — Encounter: Payer: Self-pay | Admitting: Internal Medicine

## 2015-02-14 NOTE — Telephone Encounter (Signed)
Patient stated that he got a message from my chary ask him to call, please advise

## 2015-02-15 NOTE — Telephone Encounter (Signed)
I contacted the pt and advised him. [facility] is ready to schedule his appointment. Pt was given the scheduling number and was advised to call and set an appointment time. Pt voiced understanding.

## 2015-02-16 ENCOUNTER — Ambulatory Visit
Admission: RE | Admit: 2015-02-16 | Discharge: 2015-02-16 | Disposition: A | Discharge status: Home / Self Care | Payer: 59 | Source: Ambulatory Visit | Attending: Endocrinology | Admitting: Endocrinology

## 2015-02-16 DIAGNOSIS — E079 Disorder of thyroid, unspecified: Secondary | ICD-10-CM

## 2015-02-16 IMAGING — US US SOFT TISSUE HEAD/NECK
1 series · 14 of 25 positions shown · non-contrast
Comparison: Scintigraphy [DATE]

CLINICAL DATA: Multinodular goiter. Right-sided enlargement on
recent scintigraphy.

EXAM:
THYROID ULTRASOUND
TECHNIQUE: Ultrasound examination of the thyroid gland and adjacent soft
tissues was performed.

[Series 1: us soft tissue head/neck · 0.16mm/px · 14 of 52 slices shown]
[im 1/52]
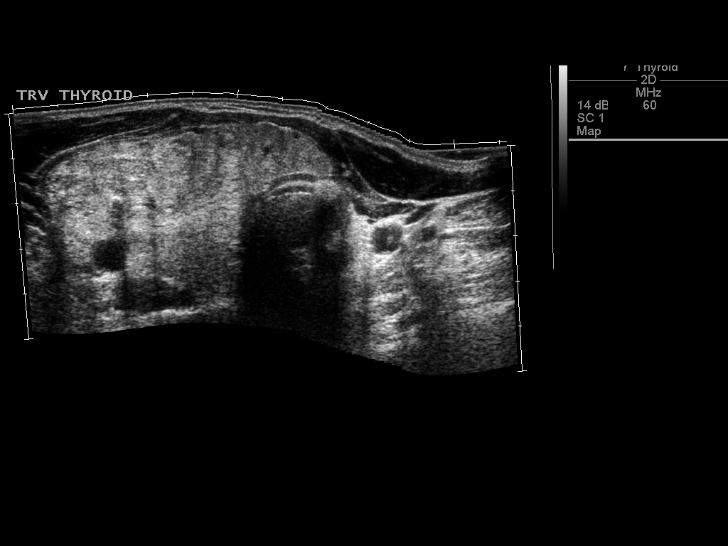
[im 5/52]
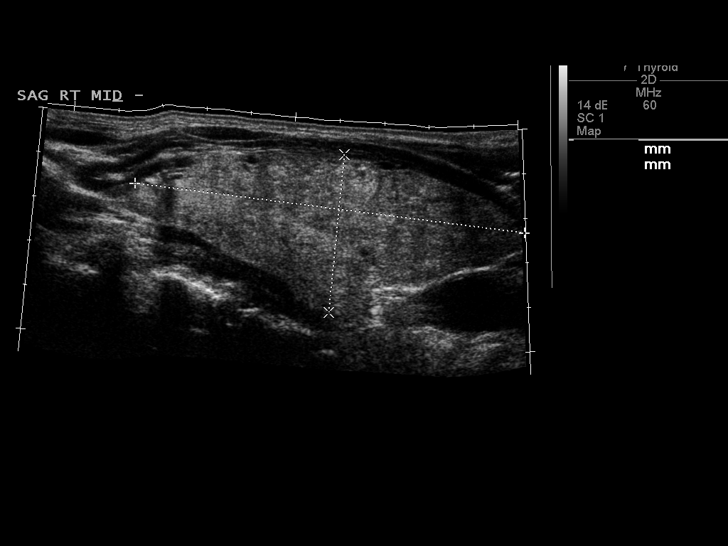
[im 9/52]
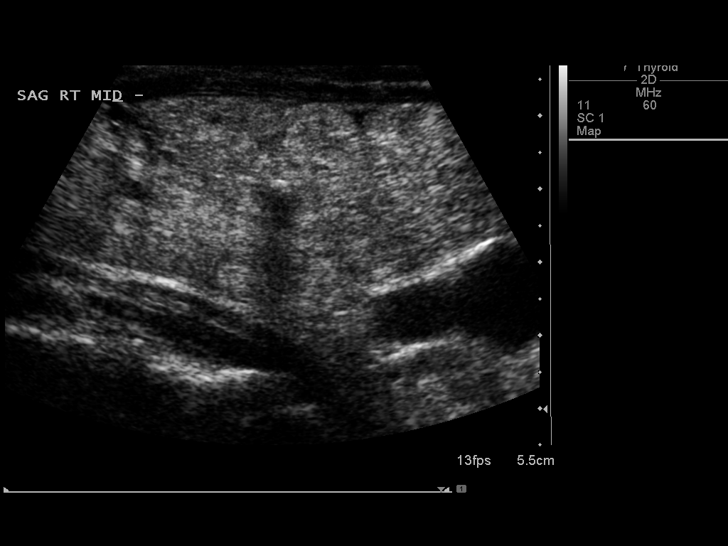
[im 13/52]
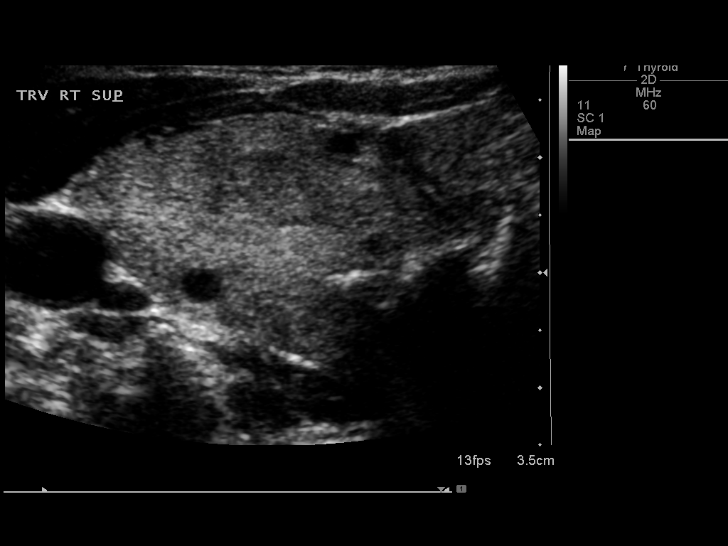
[im 18/52]
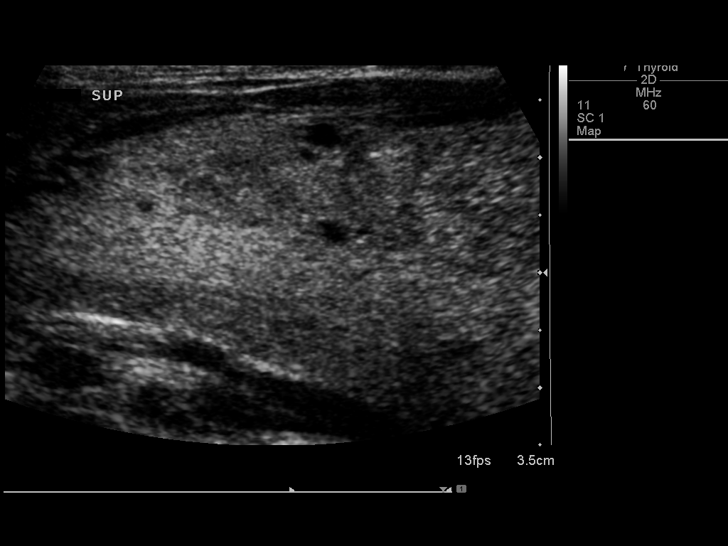
[im 20/52]
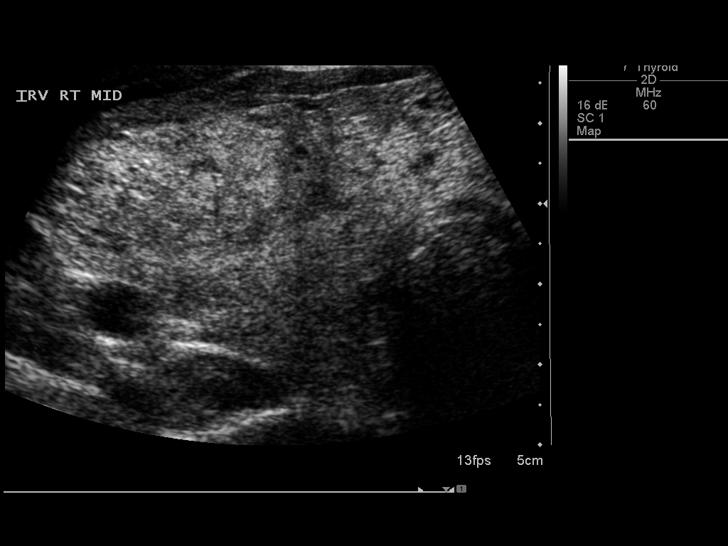
[im 24/52]
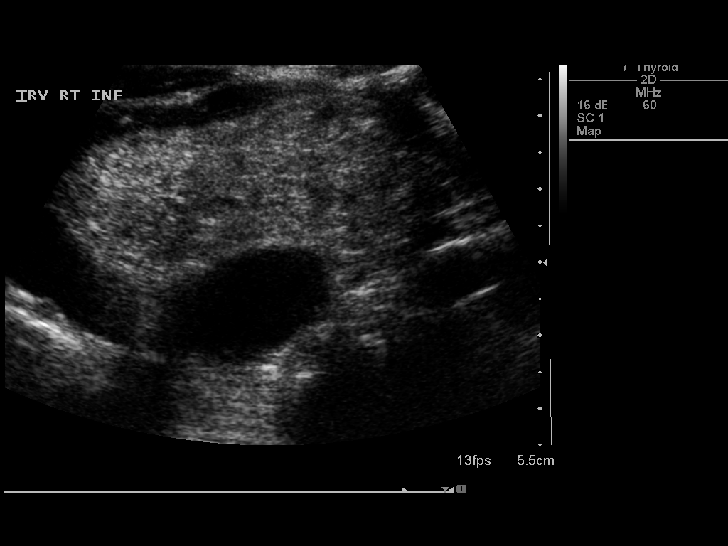
[im 28/52]
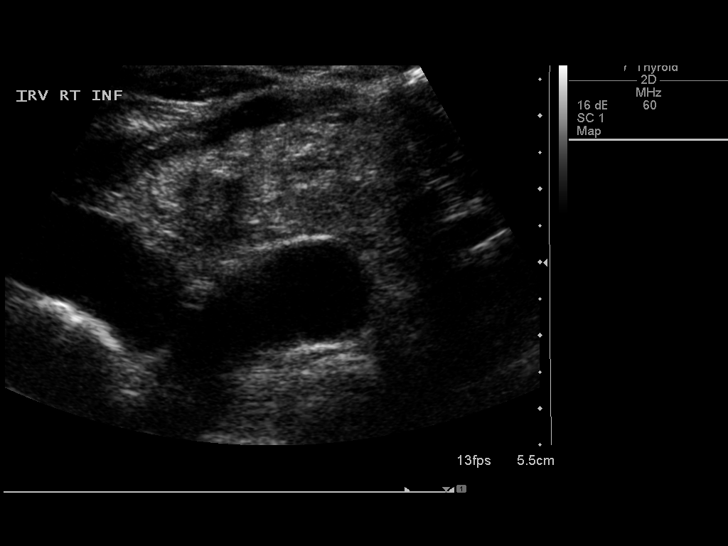
[im 32/52]
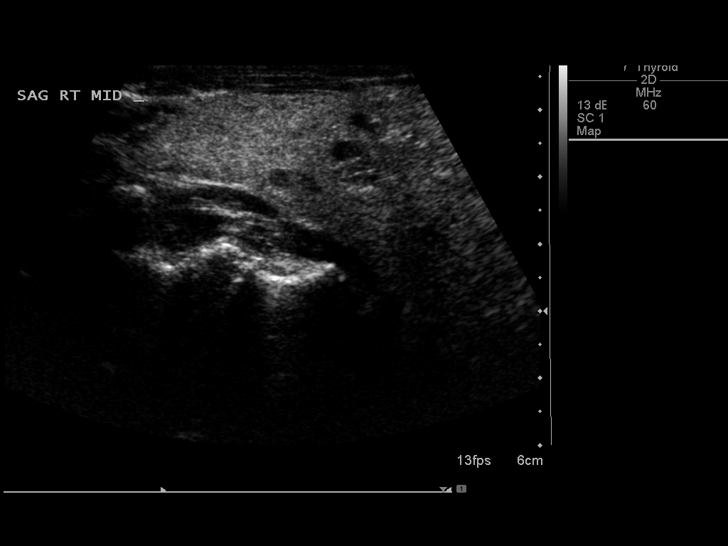
[im 35/52]
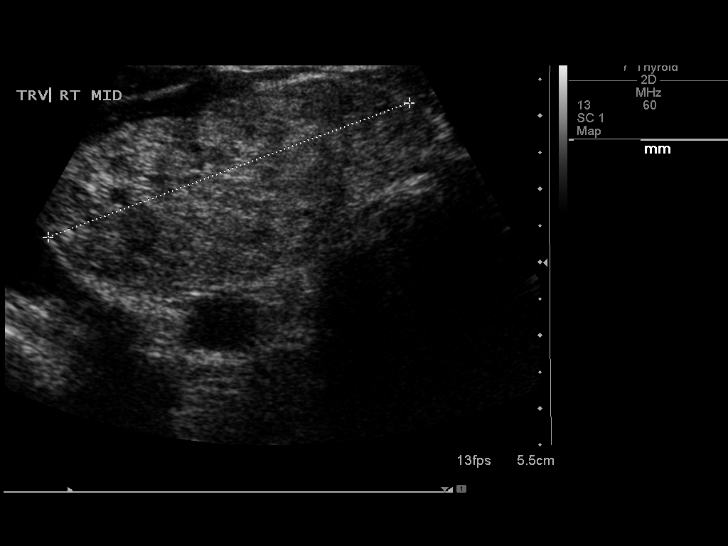
[im 39/52]
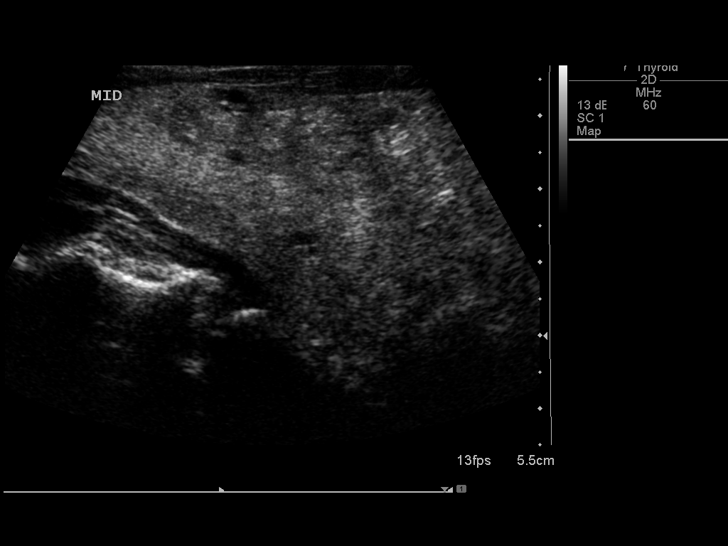
[im 43/52]
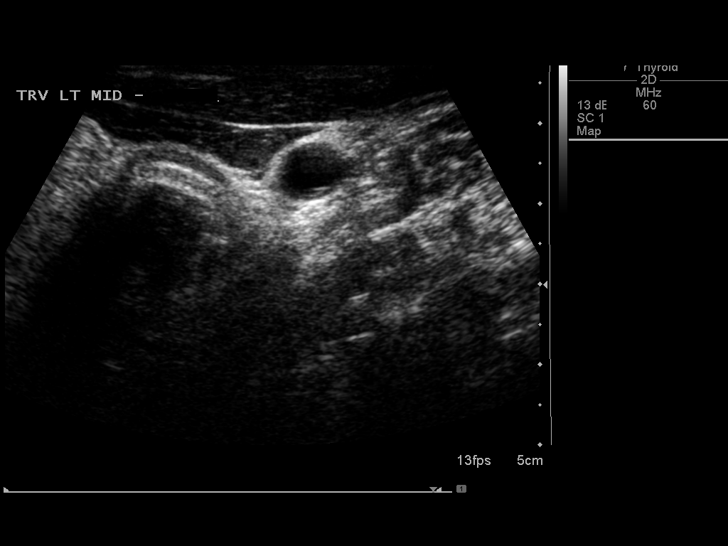
[im 47/52]
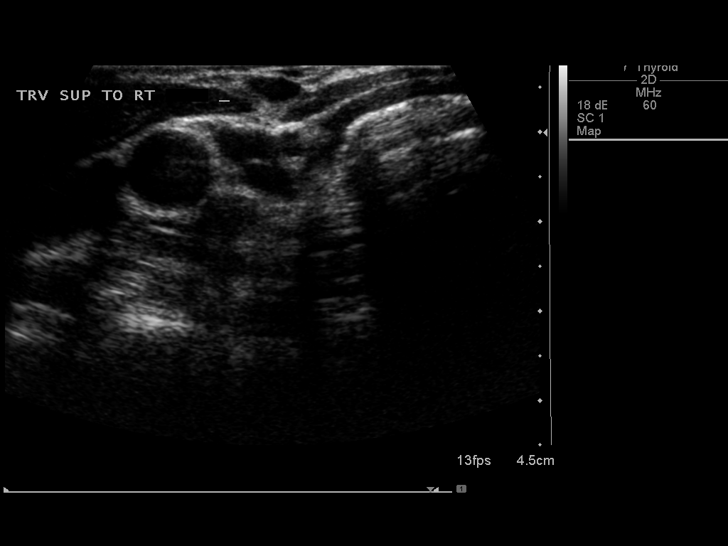
[im 52/52]
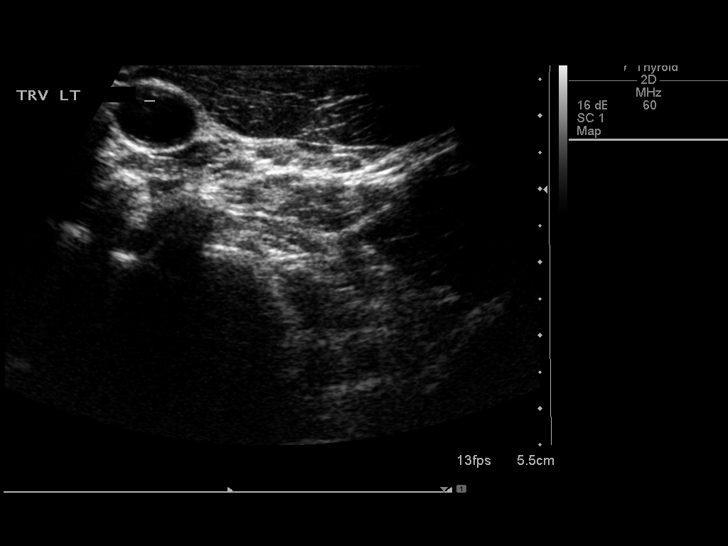

[14 of 25 positions shown; findings below may reference images not displayed]

FINDINGS: Right thyroid lobe

Measurements: 88 x 36 x 46 mm. Heterogeneous echotexture. 4 mm upper
pole cyst. Poorly marginated 11 x 7 x 11 mm solid nodule, upper
pole. Dominant 60 x 24 x 53 mm solid mass, inferior pole, with
coarse calcifications.

Left thyroid lobe

Atrophic/absent.  No nodules visualized.

Isthmus

Thickness: 6.2 mm.  No nodules visualized.

Lymphadenopathy

None visualized.
IMPRESSION: 1. Dominant 6 cm right thyroid mass. Findings meet consensus
criteria for biopsy. Ultrasound-guided fine needle aspiration should
be considered, as per the consensus statement: Management of Thyroid
Nodules Detected at US: Society of Radiologists in Ultrasound
2. Atrophic/absent left thyroid lobe.

## 2015-02-20 ENCOUNTER — Ambulatory Visit (INDEPENDENT_AMBULATORY_CARE_PROVIDER_SITE_OTHER): Payer: 59 | Admitting: Endocrinology

## 2015-02-20 ENCOUNTER — Encounter: Payer: Self-pay | Admitting: Endocrinology

## 2015-02-20 ENCOUNTER — Other Ambulatory Visit (HOSPITAL_COMMUNITY)
Admission: RE | Admit: 2015-02-20 | Discharge: 2015-02-20 | Disposition: A | Discharge status: Home / Self Care | Payer: 59 | Source: Ambulatory Visit | Attending: Endocrinology | Admitting: Endocrinology

## 2015-02-20 VITALS — BP 114/76 | HR 78 | Temp 98.3°F | Wt 177.0 lb

## 2015-02-20 DIAGNOSIS — E079 Disorder of thyroid, unspecified: Secondary | ICD-10-CM

## 2015-02-20 DIAGNOSIS — E041 Nontoxic single thyroid nodule: Secondary | ICD-10-CM | POA: Diagnosis present

## 2015-02-20 NOTE — Patient Instructions (Signed)
We'll let you know about the results of the biopsy

## 2015-02-21 NOTE — Progress Notes (Signed)
   Subjective:    Patient ID: [name], male    DOB: 07-01-1960, 54 y.o.   MRN: [id number]  HPI (procedure only) thyroid needle bx: consent obtained, signed form on chart The area is first sprayed with cooling agent local: xylocaine 2%, with epinephrine prep: alcohol pad 4 bxs are done with 25 and [id number] needles no complications   Review of Systems     Objective:   Physical Exam        Assessment & Plan:

## 2015-02-26 ENCOUNTER — Other Ambulatory Visit: Payer: Self-pay | Admitting: Cardiovascular Disease

## 2015-02-26 NOTE — Telephone Encounter (Signed)
Rx(s) sent to pharmacy electronically.  

## 2015-03-28 ENCOUNTER — Encounter: Payer: Self-pay | Admitting: Internal Medicine

## 2015-04-03 ENCOUNTER — Ambulatory Visit (INDEPENDENT_AMBULATORY_CARE_PROVIDER_SITE_OTHER): Payer: 59 | Admitting: Podiatry

## 2015-04-03 ENCOUNTER — Encounter: Payer: Self-pay | Admitting: Podiatry

## 2015-04-03 VITALS — BP 123/70 | HR 73 | Resp 12

## 2015-04-03 DIAGNOSIS — E119 Type 2 diabetes mellitus without complications: Secondary | ICD-10-CM | POA: Diagnosis not present

## 2015-04-03 DIAGNOSIS — M2020 Hallux rigidus, unspecified foot: Principal | ICD-10-CM

## 2015-04-03 DIAGNOSIS — M205X9 Other deformities of toe(s) (acquired), unspecified foot: Secondary | ICD-10-CM | POA: Diagnosis not present

## 2015-04-03 NOTE — Progress Notes (Signed)
Subjective:     Patient ID: [name], male   DOB: [birth year], 55 y.o.   MRN: [id number]  HPI this patient presents to the office for his yearly diabetic foot exam. He was seen last year and all was within normal limits except for a hallux limitus problem big toe joints both feet. He presents the office with no pain or discomfort noted in his feet   Review of Systems     Objective:   Physical Exam GENERAL APPEARANCE: Alert, conversant. Appropriately groomed. No acute distress.  VASCULAR: Pedal pulses palpable at  [facility] and PT bilateral.  Capillary refill time is immediate to all digits,  Normal temperature gradient.  Digital hair growth is present bilateral  NEUROLOGIC: sensation is normal to 5.07 monofilament at 5/5 sites bilateral.  Light touch is intact bilateral, Muscle strength normal.  MUSCULOSKELETAL: acceptable muscle strength, tone and stability bilateral.  Intrinsic muscluature intact bilateral.  Rectus appearance of foot and digits noted bilateral. Hallux limitus 1st MPJ B/L  DERMATOLOGIC: skin color, texture, and turgor are within normal limits.  No preulcerative lesions or ulcers  are seen, no interdigital maceration noted.  No open lesions present.  Digital nails are asymptomatic. No drainage noted.      Assessment:     Hallux limitus B/L  Diabetes with no complications     Plan:     Diabetic foot exam.  RTC 1 wek   Gardiner Barefoot DPM

## 2015-05-18 ENCOUNTER — Other Ambulatory Visit: Payer: Self-pay | Admitting: Internal Medicine

## 2015-05-31 ENCOUNTER — Other Ambulatory Visit: Payer: Self-pay | Admitting: Cardiovascular Disease

## 2015-05-31 NOTE — Telephone Encounter (Signed)
REFILL 

## 2015-06-01 ENCOUNTER — Encounter: Payer: Self-pay | Admitting: Endocrinology

## 2015-06-01 ENCOUNTER — Ambulatory Visit (INDEPENDENT_AMBULATORY_CARE_PROVIDER_SITE_OTHER): Payer: 59 | Admitting: Endocrinology

## 2015-06-01 VITALS — BP 118/74 | HR 71 | Temp 98.7°F | Ht 73.0 in | Wt 183.0 lb

## 2015-06-01 DIAGNOSIS — E079 Disorder of thyroid, unspecified: Secondary | ICD-10-CM | POA: Diagnosis not present

## 2015-06-01 LAB — TSH: TSH: 0.61 u[IU]/mL (ref 0.35–4.50)

## 2015-06-01 NOTE — Patient Instructions (Addendum)
blood tests are requested for you today.  We'll let you know about the results.  most of the time, a "lumpy thyroid" will eventually become overactive.  this is usually a slow process, happening over the span of many years.   Please come back for a follow-up appointment in 6 months, when you will be due for a follow-up ultrasound.

## 2015-06-01 NOTE — Progress Notes (Signed)
Subjective:    Patient ID: [name], male    DOB: [birth year], 55 y.o.   MRN: [id number]  HPI Pt returns for f/u of thyroid mass (dx'ed 2016; nuc med scan and US showed multinodular goiter, right lobe >> left; bx in 2016 showed beth cat 2).  He does not notice the goiter.  Past Medical History  Diagnosis Date  . Hypertension   . Hyperlipidemia   . Diabetes mellitus     type II  . LVH (left ventricular hypertrophy)     with systolic dysfunction by echo 10/03, EF 45-50% - Dr. Melvern Banker  . OSA (obstructive sleep apnea)   . DIABETES MELLITUS, TYPE II 08/29/2009  . HYPERLIPIDEMIA 08/29/2009  . HYPERTENSION 01/14/2008  . SLEEP APNEA, OBSTRUCTIVE 01/14/2008  . GERD (gastroesophageal reflux disease) 08/12/2010  . Chest pain 02/13/2012    echo - EF >55%;mod concentric L ventricular hypertrophy; atrial septum aneurysmal; patent foramen ovale suspected on color flow, LA mil/mod dilation; RV systolic pressure 99991111  . Chest pain 06/09/2007    echo - EF 45-50%; interarterial shunt; mild valvular regurgitation;   . Chest pain 06/09/2007    Myoview - EF 53%; normal perfusion all regions;EKG negative for ischemia   . Nonspecific ST-T wave electrocardiographic changes 08/11/2006    Myoview - EF 49%; normal perfusion all regions, EKG negative for ischemia  . Lightheadedness 05/10/2002    30d monitor unremarkable  . Sleep apnea 08/13/2008    AHI 1.2; sleep study 10/2007 - AHI 9.47at 15cm H2O and 31.3 at 16cm H2O  . Hemorrhoids   . History of anal fissures     Past Surgical History  Procedure Laterality Date  . Anal sphincterotomy  08/20/11  . Cardiac catheterization  10/23/2008    normal coronary arteries and LV function  . Colonoscopy    . Polypectomy      Social History   Social History  . Marital Status: Single    Spouse Name: N/A  . Number of Children: 1  . Years of Education: N/A   Occupational History  . work as truck Environmental health practitioner Tobacco   Social History Main Topics  .  Smoking status: Current Every Day Smoker -- 0.50 packs/day    Types: Cigarettes  . Smokeless tobacco: Never Used  . Alcohol Use: 1.5 oz/week    3 drink(s) per week     Comment: occaisionally  . Drug Use: No  . Sexual Activity: Not on file   Other Topics Concern  . Not on file   Social History Narrative    Current Outpatient Prescriptions on File Prior to Visit  Medication Sig Dispense Refill  . amLODipine-benazepril (LOTREL) 10-40 MG capsule TAKE 1 CAPSULE BY MOUTH DAILY. 90 capsule 1  . aspirin 81 MG EC tablet Take 81 mg by mouth daily.      . chlorthalidone (HYGROTON) 25 MG tablet TAKE 1 TABLET BY MOUTH EVERY MORNING 30 tablet 6  . Docusate Calcium ([employer] STOOL SOFTENER PO) Take 2 tablets by mouth 2 (two) times daily.     . hydrALAZINE (APRESOLINE) 50 MG tablet TAKE 1 TABLET BY MOUTH 3 TIMES A DAY 270 tablet 11  . JANUVIA 100 MG tablet TAKE 1 TABLET BY MOUTH DAILY 90 tablet 1  . KLOR-CON 10 10 MEQ tablet TAKE 1 TABLET (10 MEQ TOTAL) BY MOUTH DAILY. 90 tablet 3  . Lancets (ONETOUCH ULTRASOFT) lancets TEST AS DIRECTED 100 each 0  . metFORMIN (GLUCOPHAGE) 500 MG tablet TAKE 2 TABLETS BY MOUTH  TWICE DAILY 120 tablet 5  . nebivolol (BYSTOLIC) 10 MG tablet Take 20 mg by mouth 2 (two) times daily.    Marland Kitchen omeprazole (PRILOSEC) 20 MG capsule Take 1 capsule (20 mg total) by mouth daily. 90 capsule 0  . ONE TOUCH ULTRA TEST test strip TEST THREE TIMES A DAY AS INSTRUCTED. 100 each 1  . pravastatin (PRAVACHOL) 40 MG tablet TAKE 1 TABLET (40 MG TOTAL) BY MOUTH DAILY. 90 tablet 3  . Acetaminophen-Codeine (TYLENOL/CODEINE #3) 300-30 MG per tablet Take 1 tablet by mouth every 6 (six) hours as needed for pain. (Patient not taking: Reported on 06/01/2015) 40 tablet 0  . ibuprofen (ADVIL,MOTRIN) 200 MG tablet Take 400 mg by mouth every 6 (six) hours as needed. Reported on 06/01/2015     No current facility-administered medications on file prior to visit.    Allergies  Allergen Reactions  . Sulfa  Antibiotics Hives and Swelling  . Vicodin [Hydrocodone-Acetaminophen] Itching    Family History  Problem Relation Age of Onset  . Hypertension Mother   . Heart disease Father   . Colon cancer Neg Hx   . Thyroid disease Neg Hx     BP 118/74 mmHg  Pulse 71  Temp(Src) 98.7 F (37.1 C) (Oral)  Ht 6\' 1"  (1.854 m)  Wt 183 lb (83.008 kg)  BMI 24.15 kg/m2  SpO2 97%  Review of Systems Denies sob and dysphagia    Objective:   Physical Exam VITAL SIGNS:  See vs page.  GENERAL: no distress.  NECK: the right thyroid mass is again easily palpable.    Lab Results  Component Value Date   TSH 0.61 06/01/2015      Assessment & Plan:  Thyroid mass, clinically unchanged.  Euthyroid.   Patient is advised the following: Patient Instructions  blood tests are requested for you today.  We'll let you know about the results.  most of the time, a "lumpy thyroid" will eventually become overactive.  this is usually a slow process, happening over the span of many years.   Please come back for a follow-up appointment in 6 months, when you will be due for a follow-up ultrasound.

## 2015-06-17 ENCOUNTER — Other Ambulatory Visit: Payer: Self-pay | Admitting: Internal Medicine

## 2015-06-20 ENCOUNTER — Ambulatory Visit (INDEPENDENT_AMBULATORY_CARE_PROVIDER_SITE_OTHER): Payer: 59 | Admitting: Internal Medicine

## 2015-06-20 ENCOUNTER — Encounter: Payer: Self-pay | Admitting: Internal Medicine

## 2015-06-20 VITALS — BP 140/78 | HR 70 | Temp 99.0°F | Resp 20 | Wt 183.0 lb

## 2015-06-20 DIAGNOSIS — I10 Essential (primary) hypertension: Secondary | ICD-10-CM | POA: Diagnosis not present

## 2015-06-20 DIAGNOSIS — R062 Wheezing: Secondary | ICD-10-CM | POA: Diagnosis not present

## 2015-06-20 DIAGNOSIS — R05 Cough: Secondary | ICD-10-CM | POA: Diagnosis not present

## 2015-06-20 DIAGNOSIS — R059 Cough, unspecified: Secondary | ICD-10-CM | POA: Insufficient documentation

## 2015-06-20 DIAGNOSIS — E0800 Diabetes mellitus due to underlying condition with hyperosmolarity without nonketotic hyperglycemic-hyperosmolar coma (NKHHC): Secondary | ICD-10-CM | POA: Diagnosis not present

## 2015-06-20 DIAGNOSIS — Z794 Long term (current) use of insulin: Secondary | ICD-10-CM

## 2015-06-20 MED ORDER — PREDNISONE 10 MG PO TABS: ORAL_TABLET | ORAL | Status: DC

## 2015-06-20 MED ORDER — HYDROCODONE-HOMATROPINE 5-1.5 MG/5ML PO SYRP: 5.0000 mL | ORAL_SOLUTION | Freq: Four times a day (QID) | ORAL | Status: DC | PRN

## 2015-06-20 MED ORDER — LEVOFLOXACIN 500 MG PO TABS: 500.0000 mg | ORAL_TABLET | Freq: Every day | ORAL | Status: DC

## 2015-06-20 MED ORDER — PROMETHAZINE-CODEINE 6.25-10 MG/5ML PO SYRP: 5.0000 mL | ORAL_SOLUTION | ORAL | Status: DC | PRN

## 2015-06-20 NOTE — Patient Instructions (Signed)
Please take all new medication as prescribed - the antibiotic, cough medicine and prednisone  Please continue all other medications as before, and refills have been done if requested.  Please have the pharmacy call with any other refills you may need.  Please keep your appointments with your specialists as you may have planned     

## 2015-06-20 NOTE — Progress Notes (Signed)
Pre visit review using our clinic review tool, if applicable. No additional management support is needed unless otherwise documented below in the visit note. 

## 2015-06-20 NOTE — Progress Notes (Signed)
Subjective:    Patient ID: [name], male    DOB: November 01, 1960, 55 y.o.   MRN: [id number]  HPI  Here with acute onset mild to mod 2-3 days ST, HA, general weakness and malaise, with prod cough greenish sputum, but Pt denies chest pain, increased sob or doe, wheezing, orthopnea, PND, increased LE swelling, palpitations, dizziness or syncope, except for onset mild wheezing/sob since last pm.  Pt denies new neurological symptoms such as new headache, or facial or extremity weakness or numbness   Pt denies polydipsia, polyuria Past Medical History  Diagnosis Date  . Hypertension   . Hyperlipidemia   . Diabetes mellitus     type II  . LVH (left ventricular hypertrophy)     with systolic dysfunction by echo 10/03, EF 45-50% - Dr. Melvern Banker  . OSA (obstructive sleep apnea)   . DIABETES MELLITUS, TYPE II 08/29/2009  . HYPERLIPIDEMIA 08/29/2009  . HYPERTENSION 01/14/2008  . SLEEP APNEA, OBSTRUCTIVE 01/14/2008  . GERD (gastroesophageal reflux disease) 08/12/2010  . Chest pain 02/13/2012    echo - EF >55%;mod concentric L ventricular hypertrophy; atrial septum aneurysmal; patent foramen ovale suspected on color flow, LA mil/mod dilation; RV systolic pressure 99991111  . Chest pain 06/09/2007    echo - EF 45-50%; interarterial shunt; mild valvular regurgitation;   . Chest pain 06/09/2007    Myoview - EF 53%; normal perfusion all regions;EKG negative for ischemia   . Nonspecific ST-T wave electrocardiographic changes 08/11/2006    Myoview - EF 49%; normal perfusion all regions, EKG negative for ischemia  . Lightheadedness 05/10/2002    30d monitor unremarkable  . Sleep apnea 08/13/2008    AHI 1.2; sleep study 10/2007 - AHI 9.47at 15cm H2O and 31.3 at 16cm H2O  . Hemorrhoids   . History of anal fissures    Past Surgical History  Procedure Laterality Date  . Anal sphincterotomy  08/20/11  . Cardiac catheterization  10/23/2008    normal coronary arteries and LV function  . Colonoscopy    . Polypectomy       reports that he has been smoking Cigarettes.  He has been smoking about 0.50 packs per day. He has never used smokeless tobacco. He reports that he drinks about 1.5 oz of alcohol per week. He reports that he does not use illicit drugs. family history includes Heart disease in his father; Hypertension in his mother. There is no history of Colon cancer or Thyroid disease. Allergies  Allergen Reactions  . Sulfa Antibiotics Hives and Swelling  . Vicodin [Hydrocodone-Acetaminophen] Itching   Current Outpatient Prescriptions on File Prior to Visit  Medication Sig Dispense Refill  . Acetaminophen-Codeine (TYLENOL/CODEINE #3) 300-30 MG per tablet Take 1 tablet by mouth every 6 (six) hours as needed for pain. 40 tablet 0  . amLODipine-benazepril (LOTREL) 10-40 MG capsule TAKE 1 CAPSULE BY MOUTH DAILY. 90 capsule 1  . aspirin 81 MG EC tablet Take 81 mg by mouth daily.      Marland Kitchen BYSTOLIC 10 MG tablet TAKE 4 TABLETS (40MG  TOTAL) BY MOUTH DAILY. 120 tablet 5  . chlorthalidone (HYGROTON) 25 MG tablet TAKE 1 TABLET BY MOUTH EVERY MORNING 30 tablet 6  . Docusate Calcium ([employer] STOOL SOFTENER PO) Take 2 tablets by mouth 2 (two) times daily.     . hydrALAZINE (APRESOLINE) 50 MG tablet TAKE 1 TABLET BY MOUTH 3 TIMES A DAY 270 tablet 11  . ibuprofen (ADVIL,MOTRIN) 200 MG tablet Take 400 mg by mouth every 6 (six) hours  as needed. Reported on 06/01/2015    . JANUVIA 100 MG tablet TAKE 1 TABLET BY MOUTH DAILY 90 tablet 1  . KLOR-CON 10 10 MEQ tablet TAKE 1 TABLET (10 MEQ TOTAL) BY MOUTH DAILY. 90 tablet 3  . Lancets (ONETOUCH ULTRASOFT) lancets TEST AS DIRECTED 100 each 0  . metFORMIN (GLUCOPHAGE) 500 MG tablet TAKE 2 TABLETS BY MOUTH TWICE DAILY 120 tablet 5  . nebivolol (BYSTOLIC) 10 MG tablet Take 20 mg by mouth 2 (two) times daily.    Marland Kitchen omeprazole (PRILOSEC) 20 MG capsule Take 1 capsule (20 mg total) by mouth daily. 90 capsule 0  . ONE TOUCH ULTRA TEST test strip TEST THREE TIMES A DAY AS INSTRUCTED. 100 each 1    . pravastatin (PRAVACHOL) 40 MG tablet TAKE 1 TABLET (40 MG TOTAL) BY MOUTH DAILY. 90 tablet 3   No current facility-administered medications on file prior to visit.   Review of Systems  Constitutional: Negative for unusual diaphoresis or night sweats HENT: Negative for ringing in ear or discharge Eyes: Negative for double vision or worsening visual disturbance.  Respiratory: Negative for choking and stridor.   Gastrointestinal: Negative for vomiting or other signifcant bowel change Genitourinary: Negative for hematuria or change in urine volume.  Musculoskeletal: Negative for other MSK pain or swelling Skin: Negative for color change and worsening wound.  Neurological: Negative for tremors and numbness other than noted  Psychiatric/Behavioral: Negative for decreased concentration or agitation other than above       Objective:   Physical Exam BP 140/78 mmHg  Pulse 70  Temp(Src) 99 F (37.2 C) (Oral)  Resp 20  Wt 183 lb (83.008 kg)  SpO2 96% VS noted, mild ill Constitutional: Pt appears in no significant distress HENT: Head: NCAT.  Right Ear: External ear normal.  Left Ear: External ear normal.  Bilat tm's with mild erythema.  Max sinus areas non tender.  Pharynx with mild erythema, no exudate Eyes: . Pupils are equal, round, and reactive to light. Conjunctivae and EOM are normal Neck: Normal range of motion. Neck supple.  Cardiovascular: Normal rate and regular rhythm.   Pulmonary/Chest: Effort normal and breath sounds without rales but with few mild scattered wheezing.  Neurological: Pt is alert. Not confused , motor grossly intact Skin: Skin is warm. No rash, no LE edema Psychiatric: Pt behavior is normal. No agitation.     Assessment & Plan:

## 2015-06-21 NOTE — Assessment & Plan Note (Signed)
stable overall by history and exam, recent data reviewed with pt, and pt to continue medical treatment as before,  to f/u any worsening symptoms or concerns Lab Results  Component Value Date   HGBA1C 5.7 11/15/2014   Pt to call for onset polys or cbg > 200 on current tx

## 2015-06-21 NOTE — Assessment & Plan Note (Signed)
Mild to mod, c/w bronchospastic etiology, for predpac asd,  to f/u any worsening symptoms or concerns

## 2015-06-21 NOTE — Assessment & Plan Note (Signed)
Mild to mod, c/w bronchitis vs pna, for antibx course, cough med prn,  to f/u any worsening symptoms or concerns 

## 2015-06-21 NOTE — Assessment & Plan Note (Signed)
stable overall by history and exam, recent data reviewed with pt, and pt to continue medical treatment as before,  to f/u any worsening symptoms or concerns BP Readings from Last 3 Encounters:  06/20/15 140/78  06/01/15 118/74  04/03/15 123/70

## 2015-06-28 ENCOUNTER — Encounter (INDEPENDENT_AMBULATORY_CARE_PROVIDER_SITE_OTHER): Payer: Self-pay

## 2015-07-04 ENCOUNTER — Other Ambulatory Visit: Payer: Self-pay | Admitting: Internal Medicine

## 2015-07-29 ENCOUNTER — Other Ambulatory Visit: Payer: Self-pay | Admitting: Internal Medicine

## 2015-07-30 ENCOUNTER — Other Ambulatory Visit: Payer: Self-pay | Admitting: Internal Medicine

## 2015-08-16 ENCOUNTER — Other Ambulatory Visit: Payer: Self-pay | Admitting: Internal Medicine

## 2015-09-21 ENCOUNTER — Other Ambulatory Visit: Payer: Self-pay | Admitting: Cardiovascular Disease

## 2015-11-10 ENCOUNTER — Other Ambulatory Visit: Payer: Self-pay | Admitting: Internal Medicine

## 2015-11-24 ENCOUNTER — Other Ambulatory Visit: Payer: Self-pay | Admitting: Internal Medicine

## 2015-11-28 ENCOUNTER — Telehealth: Payer: Self-pay | Admitting: Cardiovascular Disease

## 2015-11-28 ENCOUNTER — Encounter: Payer: Self-pay | Admitting: Cardiovascular Disease

## 2015-11-28 NOTE — Telephone Encounter (Signed)
Closed Encounter  °

## 2015-12-02 ENCOUNTER — Other Ambulatory Visit: Payer: Self-pay | Admitting: Internal Medicine

## 2015-12-06 ENCOUNTER — Encounter: Payer: Self-pay | Admitting: Endocrinology

## 2015-12-06 ENCOUNTER — Ambulatory Visit (INDEPENDENT_AMBULATORY_CARE_PROVIDER_SITE_OTHER): Payer: 59 | Admitting: Endocrinology

## 2015-12-06 DIAGNOSIS — E042 Nontoxic multinodular goiter: Secondary | ICD-10-CM | POA: Diagnosis not present

## 2015-12-06 HISTORY — DX: Nontoxic multinodular goiter: E04.2

## 2015-12-06 LAB — TSH: TSH: 0.81 u[IU]/mL (ref 0.35–4.50)

## 2015-12-06 NOTE — Patient Instructions (Addendum)
blood tests are requested for you today.  We'll let you know about the results. most of the time, a "lumpy thyroid" will eventually become overactive.  this is usually a slow process, happening over the span of many years. If it is overactive, you should consider shrinking it with a pill of radioactive iodine.  It works like this: We would first check a thyroid "scan" (a special, but easy and painless type of thyroid x ray): you go to the x-ray department of the hospital to swallow a pill, which contains a miniscule amount of radiation.  You will not notice any symptoms from this.  You will go back to the x-ray department the next day, to lie down in front of a camera.  The results of this will be sent to me.   Based on the results, i hope to order for you a treatment pill of radioactive iodine.  Although it is a larger amount of radiation, you will again notice no symptoms from this.  The pill is gone from your body in a few days (during which you should stay away from other people), but takes several months to work.  Therefore, please return here approximately 6-8 weeks after the treatment.  This treatment has been available for many years, and the only known side-effect is an underactive thyroid.  It is possible that i would eventually prescribe for you a thyroid hormone pill, which is very inexpensive.  You don't have to worry about side-effects of this thyroid hormone pill, because it is the same molecule your thyroid makes. Please return in 1 year.

## 2015-12-06 NOTE — Progress Notes (Signed)
Subjective:    Patient ID: [name], male    DOB: [birth year], 55 y.o.   MRN: [id number]  HPI Pt returns for f/u of multinodular goiter (dx'ed 2016; nuc med scan and US showed multinodular goiter, right lobe >> left; bx in 2016 showed beth cat 2).  He does not notice the goiter.  No weight change.   Past Medical History:  Diagnosis Date  . Chest pain 02/13/2012   echo - EF >55%;mod concentric L ventricular hypertrophy; atrial septum aneurysmal; patent foramen ovale suspected on color flow, LA mil/mod dilation; RV systolic pressure 99991111  . Chest pain 06/09/2007   echo - EF 45-50%; interarterial shunt; mild valvular regurgitation;   . Chest pain 06/09/2007   Myoview - EF 53%; normal perfusion all regions;EKG negative for ischemia   . Diabetes mellitus    type II  . DIABETES MELLITUS, TYPE II 08/29/2009  . GERD (gastroesophageal reflux disease) 08/12/2010  . Hemorrhoids   . History of anal fissures   . Hyperlipidemia   . HYPERLIPIDEMIA 08/29/2009  . Hypertension   . HYPERTENSION 01/14/2008  . Lightheadedness 05/10/2002   30d monitor unremarkable  . LVH (left ventricular hypertrophy)    with systolic dysfunction by echo 10/03, EF 45-50% - Dr. Melvern Banker  . Multinodular goiter 12/06/2015  . Nonspecific ST-T wave electrocardiographic changes 08/11/2006   Myoview - EF 49%; normal perfusion all regions, EKG negative for ischemia  . OSA (obstructive sleep apnea)   . Sleep apnea 08/13/2008   AHI 1.2; sleep study 10/2007 - AHI 9.47at 15cm H2O and 31.3 at 16cm H2O  . SLEEP APNEA, OBSTRUCTIVE 01/14/2008    Past Surgical History:  Procedure Laterality Date  . ANAL SPHINCTEROTOMY  08/20/11  . CARDIAC CATHETERIZATION  10/23/2008   normal coronary arteries and LV function  . COLONOSCOPY    . POLYPECTOMY      Social History   Social History  . Marital status: Single    Spouse name: N/A  . Number of children: 1  . Years of education: N/A   Occupational History  . work as truck Medical laboratory scientific officer Tobacco   Social History Main Topics  . Smoking status: Current Every Day Smoker    Packs/day: 0.50    Types: Cigarettes  . Smokeless tobacco: Never Used  . Alcohol use 1.5 oz/week    3 drink(s) per week     Comment: occaisionally  . Drug use: No  . Sexual activity: Not on file   Other Topics Concern  . Not on file   Social History Narrative  . No narrative on file    Current Outpatient Prescriptions on File Prior to Visit  Medication Sig Dispense Refill  . Acetaminophen-Codeine (TYLENOL/CODEINE #3) 300-30 MG per tablet Take 1 tablet by mouth every 6 (six) hours as needed for pain. 40 tablet 0  . amLODipine-benazepril (LOTREL) 10-40 MG capsule TAKE 1 CAPSULE BY MOUTH DAILY. 90 capsule 1  . aspirin 81 MG EC tablet Take 81 mg by mouth daily.      Marland Kitchen BYSTOLIC 10 MG tablet TAKE 4 TABLETS (40MG  TOTAL) BY MOUTH DAILY. 120 tablet 5  . chlorthalidone (HYGROTON) 25 MG tablet TAKE 1 TABLET BY MOUTH EVERY MORNING 30 tablet 3  . Docusate Calcium ([employer] STOOL SOFTENER PO) Take 2 tablets by mouth 2 (two) times daily.     . hydrALAZINE (APRESOLINE) 50 MG tablet TAKE 1 TABLET BY MOUTH 3 TIMES A DAY 270 tablet 11  . ibuprofen (ADVIL,MOTRIN) 200 MG tablet  Take 400 mg by mouth every 6 (six) hours as needed. Reported on 06/01/2015    . JANUVIA 100 MG tablet TAKE 1 TABLET BY MOUTH DAILY 90 tablet 1  . KLOR-CON 10 10 MEQ tablet TAKE 1 TABLET (10 MEQ TOTAL) BY MOUTH DAILY. 90 tablet 3  . Lancets (ONETOUCH ULTRASOFT) lancets TEST AS DIRECTED 100 each 0  . levofloxacin (LEVAQUIN) 500 MG tablet Take 1 tablet (500 mg total) by mouth daily. 10 tablet 0  . metFORMIN (GLUCOPHAGE) 500 MG tablet TAKE 2 TABLETS BY MOUTH TWICE DAILY 120 tablet 5  . nebivolol (BYSTOLIC) 10 MG tablet Take 20 mg by mouth 2 (two) times daily.    Marland Kitchen omeprazole (PRILOSEC) 20 MG capsule Take 1 capsule (20 mg total) by mouth daily. Overdue for yearly physical w/labs must see Md for refills 30 capsule 0  . ONE TOUCH ULTRA TEST test  strip TEST THREE TIMES A DAY AS INSTRUCTED. 100 each 1  . pravastatin (PRAVACHOL) 40 MG tablet TAKE 1 TABLET (40 MG TOTAL) BY MOUTH DAILY. 90 tablet 3  . predniSONE (DELTASONE) 10 MG tablet 3 tabs by mouth per day for 3 days,2tabs per day for 3 days,1tab per day for 3 days 18 tablet 0  . promethazine-codeine (PHENERGAN WITH CODEINE) 6.25-10 MG/5ML syrup Take 5 mLs by mouth every 4 (four) hours as needed for cough. 180 mL 0   No current facility-administered medications on file prior to visit.     Allergies  Allergen Reactions  . Sulfa Antibiotics Hives and Swelling  . Vicodin [Hydrocodone-Acetaminophen] Itching    Family History  Problem Relation Age of Onset  . Hypertension Mother   . Heart disease Father   . Colon cancer Neg Hx   . Thyroid disease Neg Hx     BP 122/78   Pulse 72   Ht 6\' 1"  ([id number] m)   Wt 180 lb (81.6 kg)   SpO2 97%   BMI 23.75 kg/m   Review of Systems Denies dysphagia and sob.      Objective:   Physical Exam VITAL SIGNS:  See vs page.   GENERAL: no distress.   NECK: the right thyroid mass is again easily palpable.    Lab Results  Component Value Date   TSH 0.81 12/06/2015      Assessment & Plan:  Multinodular goiter, clinically unchanged.  Euthyroid.  No rx needed now.

## 2015-12-07 ENCOUNTER — Ambulatory Visit: Payer: 59 | Admitting: Endocrinology

## 2015-12-09 ENCOUNTER — Other Ambulatory Visit: Payer: Self-pay | Admitting: Internal Medicine

## 2015-12-11 ENCOUNTER — Ambulatory Visit (INDEPENDENT_AMBULATORY_CARE_PROVIDER_SITE_OTHER): Payer: 59 | Admitting: Internal Medicine

## 2015-12-11 ENCOUNTER — Encounter: Payer: Self-pay | Admitting: Internal Medicine

## 2015-12-11 ENCOUNTER — Other Ambulatory Visit: Payer: Self-pay | Admitting: Internal Medicine

## 2015-12-11 ENCOUNTER — Ambulatory Visit (INDEPENDENT_AMBULATORY_CARE_PROVIDER_SITE_OTHER)
Admission: RE | Admit: 2015-12-11 | Discharge: 2015-12-11 | Disposition: A | Discharge status: Home / Self Care | Payer: 59 | Source: Ambulatory Visit | Attending: Internal Medicine | Admitting: Internal Medicine

## 2015-12-11 ENCOUNTER — Other Ambulatory Visit (INDEPENDENT_AMBULATORY_CARE_PROVIDER_SITE_OTHER): Payer: 59

## 2015-12-11 VITALS — BP 128/78 | HR 68 | Temp 98.7°F | Resp 20 | Wt 180.0 lb

## 2015-12-11 DIAGNOSIS — E0800 Diabetes mellitus due to underlying condition with hyperosmolarity without nonketotic hyperglycemic-hyperosmolar coma (NKHHC): Secondary | ICD-10-CM | POA: Diagnosis not present

## 2015-12-11 DIAGNOSIS — R634 Abnormal weight loss: Secondary | ICD-10-CM

## 2015-12-11 DIAGNOSIS — R6889 Other general symptoms and signs: Secondary | ICD-10-CM

## 2015-12-11 DIAGNOSIS — E119 Type 2 diabetes mellitus without complications: Secondary | ICD-10-CM | POA: Diagnosis not present

## 2015-12-11 DIAGNOSIS — Z1159 Encounter for screening for other viral diseases: Secondary | ICD-10-CM

## 2015-12-11 DIAGNOSIS — Z794 Long term (current) use of insulin: Secondary | ICD-10-CM

## 2015-12-11 DIAGNOSIS — Z0001 Encounter for general adult medical examination with abnormal findings: Secondary | ICD-10-CM | POA: Diagnosis not present

## 2015-12-11 DIAGNOSIS — Z0000 Encounter for general adult medical examination without abnormal findings: Secondary | ICD-10-CM

## 2015-12-11 DIAGNOSIS — Z299 Encounter for prophylactic measures, unspecified: Secondary | ICD-10-CM

## 2015-12-11 DIAGNOSIS — Z418 Encounter for other procedures for purposes other than remedying health state: Secondary | ICD-10-CM

## 2015-12-11 DIAGNOSIS — I10 Essential (primary) hypertension: Secondary | ICD-10-CM

## 2015-12-11 DIAGNOSIS — E785 Hyperlipidemia, unspecified: Secondary | ICD-10-CM

## 2015-12-11 DIAGNOSIS — Z23 Encounter for immunization: Secondary | ICD-10-CM | POA: Diagnosis not present

## 2015-12-11 LAB — URINALYSIS, ROUTINE W REFLEX MICROSCOPIC
BILIRUBIN URINE: NEGATIVE
Hgb urine dipstick: NEGATIVE
KETONES UR: NEGATIVE
LEUKOCYTES UA: NEGATIVE
Nitrite: NEGATIVE
RBC / HPF: NONE SEEN (ref 0–?)
Specific Gravity, Urine: 1.025 (ref 1.000–1.030)
Total Protein, Urine: NEGATIVE
UROBILINOGEN UA: 0.2 (ref 0.0–1.0)
Urine Glucose: NEGATIVE
WBC UA: NONE SEEN (ref 0–?)
pH: 6 (ref 5.0–8.0)

## 2015-12-11 LAB — BASIC METABOLIC PANEL
BUN: 20 mg/dL (ref 6–23)
CALCIUM: 9.5 mg/dL (ref 8.4–10.5)
CO2: 33 mEq/L — ABNORMAL HIGH (ref 19–32)
CREATININE: 1.12 mg/dL (ref 0.40–1.50)
Chloride: 102 mEq/L (ref 96–112)
GFR: 87.04 mL/min (ref >60.00)
Glucose, Bld: 96 mg/dL (ref 70–99)
Potassium: 3.3 mEq/L — ABNORMAL LOW (ref 3.5–5.1)
Sodium: 142 mEq/L (ref 135–145)

## 2015-12-11 LAB — MICROALBUMIN / CREATININE URINE RATIO
Creatinine,U: 175 mg/dL
MICROALB/CREAT RATIO: 1 mg/g (ref 0.0–30.0)
Microalb, Ur: 1.7 mg/dL (ref 0.0–1.9)

## 2015-12-11 LAB — CBC WITH DIFFERENTIAL/PLATELET
BASOS ABS: 0 10*3/uL (ref 0.0–0.1)
Basophils Relative: 0.5 % (ref 0.0–3.0)
EOS PCT: 4 % (ref 0.0–5.0)
Eosinophils Absolute: 0.3 10*3/uL (ref 0.0–0.7)
HEMATOCRIT: 38.9 % — AB (ref 39.0–52.0)
Hemoglobin: 13.1 g/dL (ref 13.0–17.0)
LYMPHS PCT: 25.5 % (ref 12.0–46.0)
Lymphs Abs: 2.1 10*3/uL (ref 0.7–4.0)
MCHC: 33.6 g/dL (ref 30.0–36.0)
MCV: 97.8 fl (ref 78.0–100.0)
MONOS PCT: 11 % (ref 3.0–12.0)
Monocytes Absolute: 0.9 10*3/uL (ref 0.1–1.0)
NEUTROS ABS: 4.8 10*3/uL (ref 1.4–7.7)
Neutrophils Relative %: 59 % (ref 43.0–77.0)
PLATELETS: 237 10*3/uL (ref 150.0–400.0)
RBC: 3.97 Mil/uL — AB (ref 4.22–5.81)
RDW: 13.8 % (ref 11.5–15.5)
WBC: 8.1 10*3/uL (ref 4.0–10.5)

## 2015-12-11 LAB — HEPATIC FUNCTION PANEL
ALT: 13 U/L (ref 0–53)
AST: 14 U/L (ref 0–37)
Albumin: 4.6 g/dL (ref 3.5–5.2)
Alkaline Phosphatase: 44 U/L (ref 39–117)
BILIRUBIN DIRECT: 0.1 mg/dL (ref 0.0–0.3)
TOTAL PROTEIN: 7.2 g/dL (ref 6.0–8.3)
Total Bilirubin: 0.5 mg/dL (ref 0.2–1.2)

## 2015-12-11 LAB — LIPID PANEL
CHOL/HDL RATIO: 3
Cholesterol: 151 mg/dL (ref 0–200)
HDL: 55.6 mg/dL (ref >39.00)
LDL CALC: 75 mg/dL (ref 0–99)
NONHDL: 95.3
Triglycerides: 103 mg/dL (ref 0.0–149.0)
VLDL: 20.6 mg/dL (ref 0.0–40.0)

## 2015-12-11 LAB — HEMOGLOBIN A1C: Hgb A1c MFr Bld: 5.7 % (ref 4.6–6.5)

## 2015-12-11 LAB — TSH: TSH: 0.49 u[IU]/mL (ref 0.35–4.50)

## 2015-12-11 IMAGING — DX DG CHEST 2V
2 series · 2 of 2 positions shown · non-contrast
Comparison: Chest x-ray of [DATE]

CLINICAL DATA: Weight loss of 10 pounds, diabetes, smoking history

EXAM:
CHEST  2 VIEW

[chest pa]
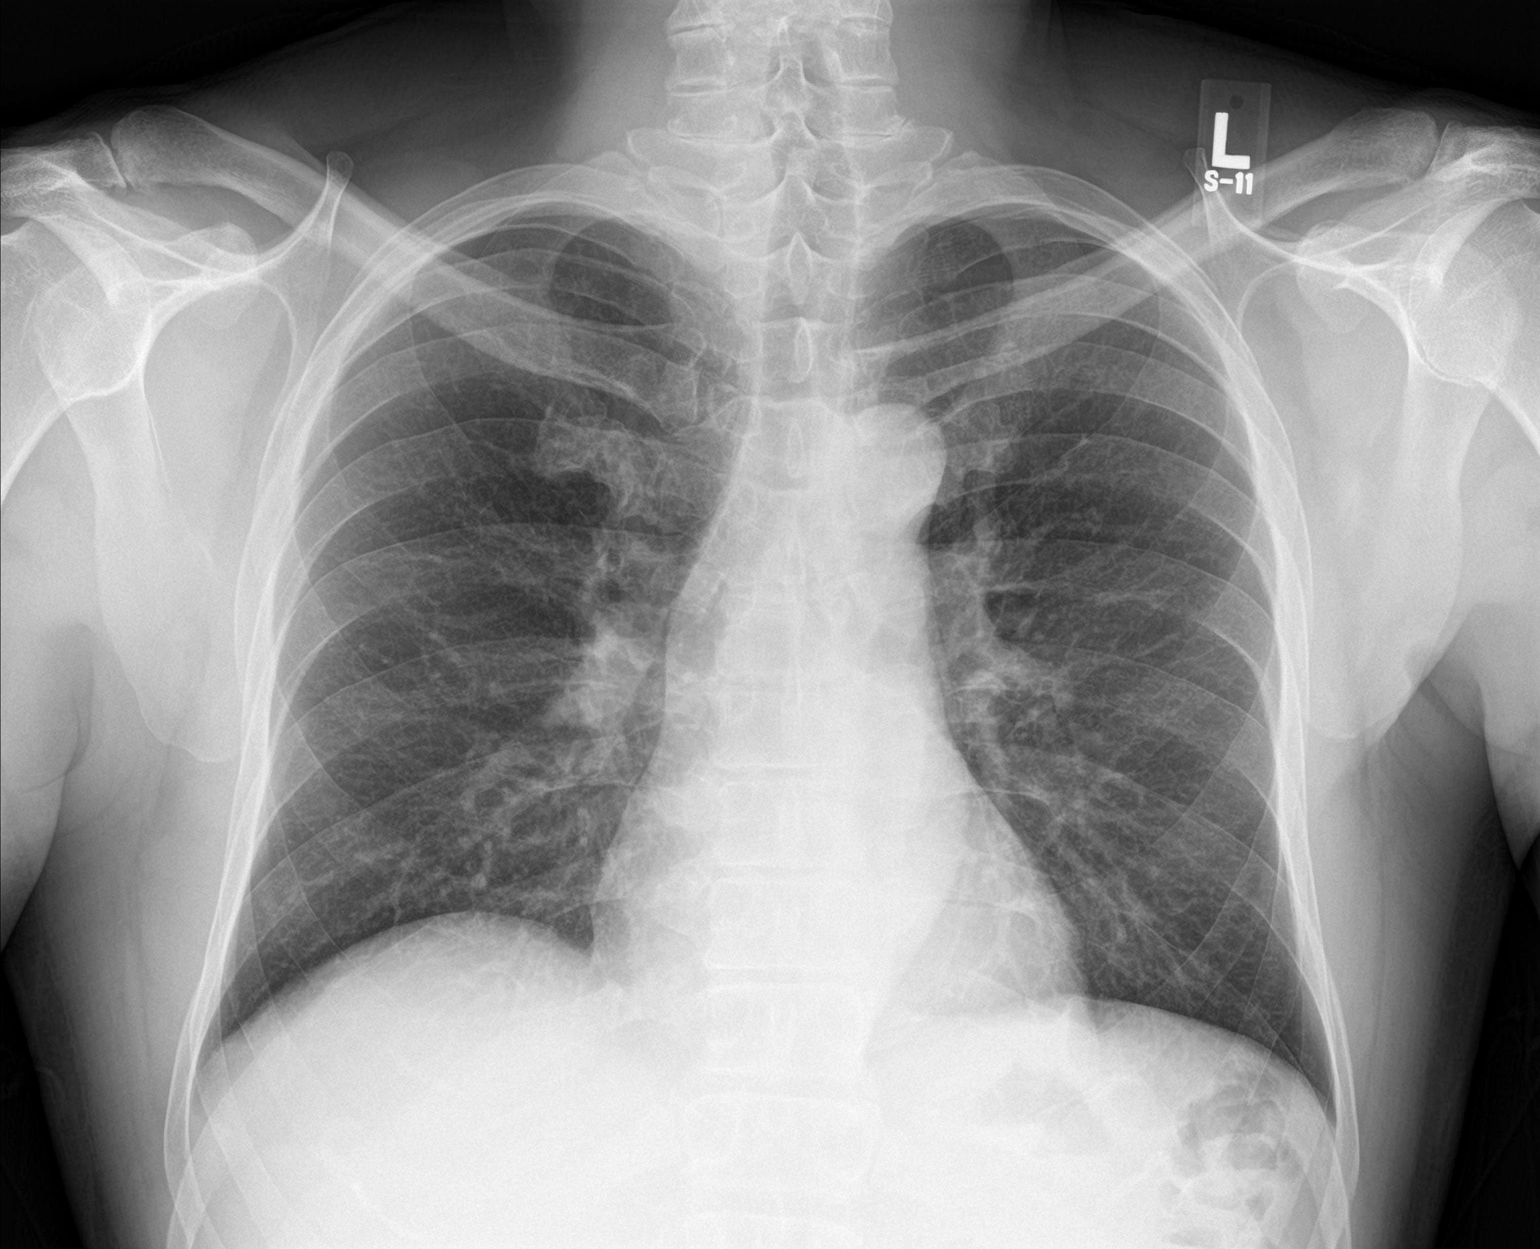

[chest lat]
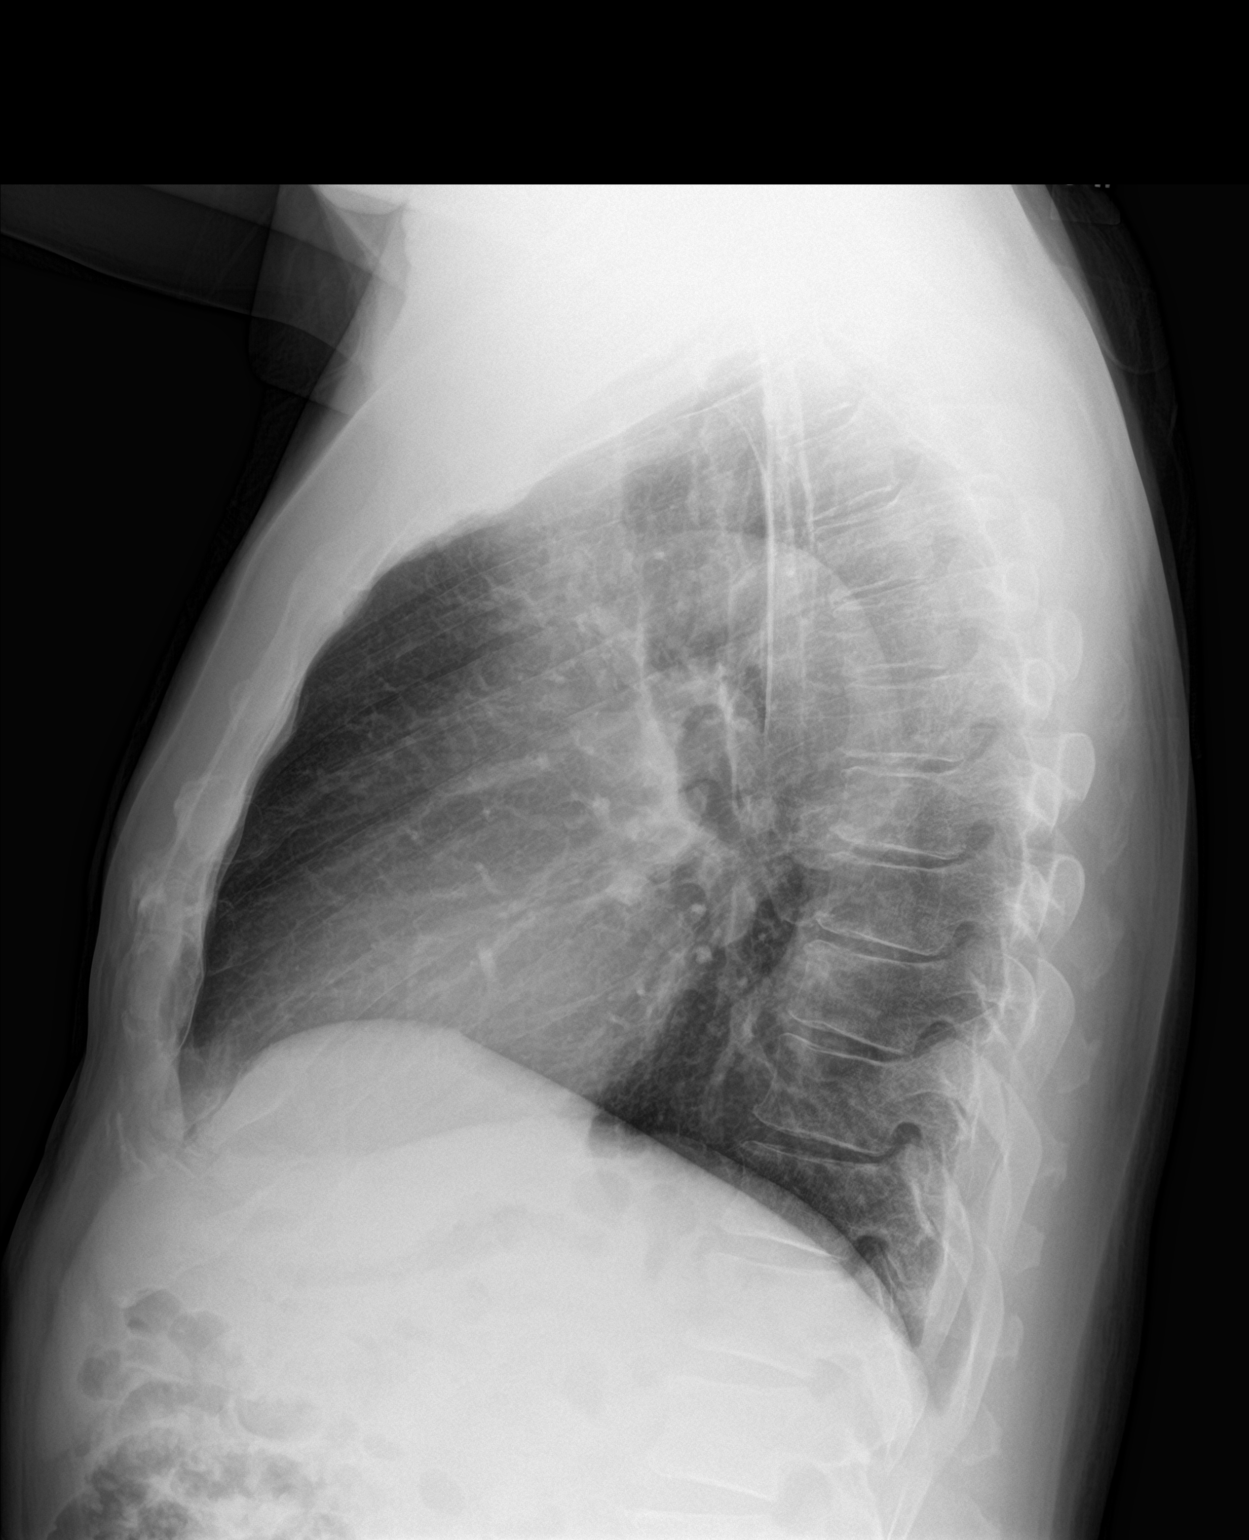

[2 of 2 positions shown; findings below may reference images not displayed]

FINDINGS: No active infiltrate or effusion is seen. Mediastinal and hilar
contours are unremarkable. The heart is within upper limits of
normal. No bony abnormality is seen.
IMPRESSION: No active cardiopulmonary disease.

## 2015-12-11 MED ORDER — OMEPRAZOLE 20 MG PO CPDR: DELAYED_RELEASE_CAPSULE | ORAL | 3 refills | Status: DC

## 2015-12-11 MED ORDER — POTASSIUM CHLORIDE ER 10 MEQ PO TBCR: 20.0000 meq | EXTENDED_RELEASE_TABLET | Freq: Every day | ORAL | 3 refills | Status: DC

## 2015-12-11 NOTE — Progress Notes (Signed)
Subjective:    Patient ID: [name], male    DOB: [birth year], 55 y.o.   MRN: [id number]  HPI  Here for wellness and f/u;  Overall doing ok;  Pt denies Chest pain, worsening SOB, DOE, wheezing, orthopnea, PND, worsening LE edema, palpitations, dizziness or syncope.  Pt denies neurological change such as new headache, facial or extremity weakness.  Pt denies polydipsia, polyuria, or low sugar symptoms. Pt states overall good compliance with treatment and medications, good tolerability, and has been trying to follow appropriate diet.  Pt denies worsening depressive symptoms, suicidal ideation or panic. No fever, night sweats, wt loss, loss of appetite, or other constitutional symptoms.  Pt states good ability with ADL's, has low fall risk, home safety reviewed and adequate, no other significant changes in hearing or vision, and only occasionally active with exercise. Lost approx 10 lbs since mar 2016,  Wt Readings from Last 3 Encounters:  12/11/15 180 lb (81.6 kg)  12/06/15 180 lb (81.6 kg)  06/20/15 183 lb (83 kg)  Denies worsening reflux, abd pain, dysphagia, n/v, bowel change or blood. Gets fiull fast but declines GI referral for now for EGD.   Usually gets flu shot at work..   Past Medical History:  Diagnosis Date  . Chest pain 02/13/2012   echo - EF >55%;mod concentric L ventricular hypertrophy; atrial septum aneurysmal; patent foramen ovale suspected on color flow, LA mil/mod dilation; RV systolic pressure 99991111  . Chest pain 06/09/2007   echo - EF 45-50%; interarterial shunt; mild valvular regurgitation;   . Chest pain 06/09/2007   Myoview - EF 53%; normal perfusion all regions;EKG negative for ischemia   . Diabetes mellitus    type II  . DIABETES MELLITUS, TYPE II 08/29/2009  . GERD (gastroesophageal reflux disease) 08/12/2010  . Hemorrhoids   . History of anal fissures   . Hyperlipidemia   . HYPERLIPIDEMIA 08/29/2009  . Hypertension   . HYPERTENSION 01/14/2008  .  Lightheadedness 05/10/2002   30d monitor unremarkable  . LVH (left ventricular hypertrophy)    with systolic dysfunction by echo 10/03, EF 45-50% - Dr. Melvern Banker  . Multinodular goiter 12/06/2015  . Nonspecific ST-T wave electrocardiographic changes 08/11/2006   Myoview - EF 49%; normal perfusion all regions, EKG negative for ischemia  . OSA (obstructive sleep apnea)   . Sleep apnea 08/13/2008   AHI 1.2; sleep study 10/2007 - AHI 9.47at 15cm H2O and 31.3 at 16cm H2O  . SLEEP APNEA, OBSTRUCTIVE 01/14/2008   Past Surgical History:  Procedure Laterality Date  . ANAL SPHINCTEROTOMY  08/20/11  . CARDIAC CATHETERIZATION  10/23/2008   normal coronary arteries and LV function  . COLONOSCOPY    . POLYPECTOMY      reports that he has been smoking Cigarettes.  He has been smoking about 0.50 packs per day. He has never used smokeless tobacco. He reports that he drinks about 1.5 oz of alcohol per week . He reports that he does not use drugs. family history includes Heart disease in his father; Hypertension in his mother. Allergies  Allergen Reactions  . Sulfa Antibiotics Hives and Swelling  . Vicodin [Hydrocodone-Acetaminophen] Itching   Current Outpatient Prescriptions on File Prior to Visit  Medication Sig Dispense Refill  . Acetaminophen-Codeine (TYLENOL/CODEINE #3) 300-30 MG per tablet Take 1 tablet by mouth every 6 (six) hours as needed for pain. 40 tablet 0  . amLODipine-benazepril (LOTREL) 10-40 MG capsule TAKE 1 CAPSULE BY MOUTH DAILY. 90 capsule 1  . aspirin 81  MG EC tablet Take 81 mg by mouth daily.      Marland Kitchen BYSTOLIC 10 MG tablet TAKE 4 TABLETS (40MG  TOTAL) BY MOUTH DAILY. 120 tablet 5  . chlorthalidone (HYGROTON) 25 MG tablet TAKE 1 TABLET BY MOUTH EVERY MORNING 30 tablet 3  . Docusate Calcium ([employer] STOOL SOFTENER PO) Take 2 tablets by mouth 2 (two) times daily.     . hydrALAZINE (APRESOLINE) 50 MG tablet TAKE 1 TABLET BY MOUTH 3 TIMES A DAY 270 tablet 11  . ibuprofen (ADVIL,MOTRIN) 200 MG tablet  Take 400 mg by mouth every 6 (six) hours as needed. Reported on 06/01/2015    . JANUVIA 100 MG tablet TAKE 1 TABLET BY MOUTH DAILY 90 tablet 1  . Lancets (ONETOUCH ULTRASOFT) lancets TEST AS DIRECTED 100 each 0  . metFORMIN (GLUCOPHAGE) 500 MG tablet TAKE 2 TABLETS BY MOUTH TWICE DAILY 120 tablet 5  . nebivolol (BYSTOLIC) 10 MG tablet Take 20 mg by mouth 2 (two) times daily.    . ONE TOUCH ULTRA TEST test strip TEST THREE TIMES A DAY AS INSTRUCTED. 100 each 1  . pravastatin (PRAVACHOL) 40 MG tablet TAKE 1 TABLET (40 MG TOTAL) BY MOUTH DAILY. 90 tablet 3  . predniSONE (DELTASONE) 10 MG tablet 3 tabs by mouth per day for 3 days,2tabs per day for 3 days,1tab per day for 3 days 18 tablet 0   No current facility-administered medications on file prior to visit.     Review of Systems Constitutional: Negative for increased diaphoresis, or other activity, appetite or siginficant weight change other than noted HENT: Negative for worsening hearing loss, ear pain, facial swelling, mouth sores and neck stiffness.   Eyes: Negative for other worsening pain, redness or visual disturbance.  Respiratory: Negative for choking or stridor Cardiovascular: Negative for other chest pain and palpitations.  Gastrointestinal: Negative for worsening diarrhea, blood in stool, or abdominal distention Genitourinary: Negative for hematuria, flank pain or change in urine volume.  Musculoskeletal: Negative for myalgias or other joint complaints.  Skin: Negative for other color change and wound or drainage.  Neurological: Negative for syncope and numbness. other than noted Hematological: Negative for adenopathy. or other swelling Psychiatric/Behavioral: Negative for hallucinations, SI, self-injury, decreased concentration or other worsening agitation.      Objective:   Physical Exam BP 128/78   Pulse 68   Temp 98.7 F (37.1 C) (Oral)   Resp 20   Wt 180 lb (81.6 kg)   SpO2 98%   BMI 23.75 kg/m  VS noted,    Constitutional: Pt is oriented to person, place, and time. Appears well-developed and well-nourished, in no significant distress Head: Normocephalic and atraumatic  Eyes: Conjunctivae and EOM are normal. Pupils are equal, round, and reactive to light Right Ear: External ear normal.  Left Ear: External ear normal Nose: Nose normal.  Mouth/Throat: Oropharynx is clear and moist  Neck: Normal range of motion. Neck supple. No JVD present. No tracheal deviation present or significant neck LA or mass Cardiovascular: Normal rate, regular rhythm, normal heart sounds and intact distal pulses.   Pulmonary/Chest: Effort normal and breath sounds without rales or wheezing  Abdominal: Soft. Bowel sounds are normal. NT. No HSM  Musculoskeletal: Normal range of motion. Exhibits no edema Lymphadenopathy: Has no cervical adenopathy.  Neurological: Pt is alert and oriented to person, place, and time. Pt has normal reflexes. No cranial nerve deficit. Motor grossly intact Skin: Skin is warm and dry. No rash noted or new ulcers Psychiatric:  Has  normal mood and affect. Behavior is normal.     Assessment & Plan:

## 2015-12-11 NOTE — Progress Notes (Signed)
Pre visit review using our clinic review tool, if applicable. No additional management support is needed unless otherwise documented below in the visit note. 

## 2015-12-11 NOTE — Patient Instructions (Addendum)
You had the Prevnar pneumonia shot  Please continue all other medications as before, and refills have been done if requested - the prilosec  Please have the pharmacy call with any other refills you may need.  Please continue your efforts at being more active, low cholesterol diet, and weight control.  You are otherwise up to date with prevention measures today.  Please keep your appointments with your specialists as you may have planned  Please go to the XRAY Department in the Basement (go straight as you get off the elevator) for the x-ray testing  Please go to the LAB in the Basement (turn left off the elevator) for the tests to be done today  You will be contacted by phone if any changes need to be made immediately.  Otherwise, you will receive a letter about your results with an explanation, but please check with MyChart first.  Please remember to sign up for MyChart if you have not done so, as this will be important to you in the future with finding out test results, communicating by private email, and scheduling acute appointments online when needed.  Please return in 6 months, or sooner if needed, with Lab testing done 3-5 days before

## 2015-12-12 ENCOUNTER — Ambulatory Visit: Payer: 59 | Admitting: Cardiovascular Disease

## 2015-12-12 LAB — HEPATITIS C ANTIBODY: HCV AB: NEGATIVE

## 2015-12-13 LAB — PROTEIN ELECTROPHORESIS, SERUM, WITH REFLEX
Albumin ELP: 4.5 g/dL (ref 3.8–4.8)
Alpha-1-Globulin: 0.3 g/dL (ref 0.2–0.3)
Alpha-2-Globulin: 0.7 g/dL (ref 0.5–0.9)
Beta 2: 0.4 g/dL (ref 0.2–0.5)
Beta Globulin: 0.4 g/dL (ref 0.4–0.6)
GAMMA GLOBULIN: 0.8 g/dL (ref 0.8–1.7)
TOTAL PROTEIN, SERUM ELECTROPHOR: 7.1 g/dL (ref 6.1–8.1)

## 2015-12-13 LAB — IFE INTERPRETATION

## 2015-12-16 NOTE — Assessment & Plan Note (Signed)
Mild with early satiety, declines GI referral

## 2015-12-16 NOTE — Assessment & Plan Note (Signed)
stable overall by history and exam, recent data reviewed with pt, and pt to continue medical treatment as before,  to f/u any worsening symptoms or concerns Lab Results  Component Value Date   LDLCALC 75 12/11/2015

## 2015-12-16 NOTE — Assessment & Plan Note (Signed)
stable overall by history and exam, recent data reviewed with pt, and pt to continue medical treatment as before,  to f/u any worsening symptoms or concerns Lab Results  Component Value Date   HGBA1C 5.7 12/11/2015

## 2015-12-16 NOTE — Assessment & Plan Note (Signed)
stable overall by history and exam, recent data reviewed with pt, and pt to continue medical treatment as before,  to f/u any worsening symptoms or concerns BP Readings from Last 3 Encounters:  12/11/15 128/78  12/06/15 122/78  06/20/15 140/78

## 2015-12-16 NOTE — Assessment & Plan Note (Signed)

## 2015-12-17 ENCOUNTER — Telehealth: Payer: Self-pay

## 2015-12-17 NOTE — Telephone Encounter (Signed)
This has been done.

## 2015-12-17 NOTE — Telephone Encounter (Signed)
-----   Message from Biagio Borg, MD sent at 12/16/2015  9:01 PM EDT ----- Regarding: finish immunization Margert Edsall to finish immunization, as I cannot close chart, thanks

## 2015-12-18 ENCOUNTER — Other Ambulatory Visit: Payer: Self-pay | Admitting: Internal Medicine

## 2015-12-18 DIAGNOSIS — Z0000 Encounter for general adult medical examination without abnormal findings: Secondary | ICD-10-CM | POA: Diagnosis not present

## 2015-12-18 DIAGNOSIS — R6889 Other general symptoms and signs: Secondary | ICD-10-CM | POA: Diagnosis not present

## 2015-12-18 DIAGNOSIS — Z23 Encounter for immunization: Secondary | ICD-10-CM | POA: Diagnosis not present

## 2015-12-23 ENCOUNTER — Other Ambulatory Visit: Payer: Self-pay | Admitting: Internal Medicine

## 2015-12-25 ENCOUNTER — Ambulatory Visit (INDEPENDENT_AMBULATORY_CARE_PROVIDER_SITE_OTHER): Payer: 59 | Admitting: Cardiovascular Disease

## 2015-12-25 ENCOUNTER — Encounter: Payer: Self-pay | Admitting: Cardiovascular Disease

## 2015-12-25 DIAGNOSIS — E785 Hyperlipidemia, unspecified: Secondary | ICD-10-CM | POA: Diagnosis not present

## 2015-12-25 DIAGNOSIS — I10 Essential (primary) hypertension: Secondary | ICD-10-CM | POA: Diagnosis not present

## 2015-12-25 DIAGNOSIS — Z720 Tobacco use: Secondary | ICD-10-CM | POA: Diagnosis not present

## 2015-12-25 NOTE — Assessment & Plan Note (Signed)
History of hyperlipidemia on statin therapy with recent lipid profile performed 12/11/15 revealed total cholesterol of 51, LDL 75 and HDL of 55

## 2015-12-25 NOTE — Assessment & Plan Note (Signed)
History of hypertension blood pressure measured today at 124/60. He is on amlodipine, an Isuprel, chloride down, hydralazine and Bystolic . Continue current meds at current dosing

## 2015-12-25 NOTE — Assessment & Plan Note (Signed)
History of continued tobacco abuse one half pack per day recalcitrant risk factor modification

## 2015-12-25 NOTE — Progress Notes (Signed)
12/25/2015 [name]   [birth year]  [id number]  Primary Physician Cathlean Cower, MD Primary Cardiologist: Lorretta Harp MD Renae Gloss  HPI:   The patient is a 55 year old, mildly overweight, single [place] American male, father of 71, grandfather to 2 grandchildren who works as a Administrator for [employer]. I saw him in the office 12/12/14. His risk factors include tobacco abuse, hypertension and dyslipidemia. I catheterized him radially October 23, 2008, revealing normal coronary arteries and normal LV function. I felt his chest pain symptoms were related to reflux. Dr. Jenny Reichmann has been adjusting antihypertensive medications. He is aware of salt intake. He does have significant LVH on his EKG with a past echo performed 2 years ago revealed moderate concentric LVH with normal LV function..Since I saw him a year ago he is completely asymptomatic. His blood pressure is in the much better control and the addition of hydralazine. His most recent lipid profile performed 12/11/15 revealed total cholesterol of 51, LDL 75 and HDL 55.   Current Outpatient Prescriptions  Medication Sig Dispense Refill  . amLODipine-benazepril (LOTREL) 10-40 MG capsule TAKE 1 CAPSULE BY MOUTH DAILY. 90 capsule 1  . aspirin 81 MG EC tablet Take 81 mg by mouth daily.      . chlorthalidone (HYGROTON) 25 MG tablet TAKE 1 TABLET BY MOUTH EVERY MORNING 30 tablet 3  . Docusate Calcium ([employer] STOOL SOFTENER PO) Take 2 tablets by mouth 2 (two) times daily.     . hydrALAZINE (APRESOLINE) 50 MG tablet TAKE 1 TABLET BY MOUTH 3 TIMES A DAY 270 tablet 11  . ibuprofen (ADVIL,MOTRIN) 200 MG tablet Take 400 mg by mouth every 6 (six) hours as needed. Reported on 06/01/2015    . JANUVIA 100 MG tablet TAKE 1 TABLET BY MOUTH DAILY 90 tablet 1  . Lancets (ONETOUCH ULTRASOFT) lancets TEST AS DIRECTED 100 each 0  . metFORMIN (GLUCOPHAGE) 500 MG tablet TAKE 2 TABLETS BY MOUTH TWICE DAILY 120 tablet 5  . nebivolol (BYSTOLIC) 10  MG tablet Take 20 mg by mouth 2 (two) times daily.    Marland Kitchen omeprazole (PRILOSEC) 20 MG capsule TAKE 1 CAPSULE EVERY DAY *NEEDS APPT FOR FURTHER REFILLS* 90 capsule 3  . ONE TOUCH ULTRA TEST test strip TEST THREE TIMES A DAY AS INSTRUCTED. 100 each 1  . potassium chloride (KLOR-CON 10) 10 MEQ tablet Take 2 tablets (20 mEq total) by mouth daily. 180 tablet 3  . pravastatin (PRAVACHOL) 40 MG tablet TAKE 1 TABLET (40 MG TOTAL) BY MOUTH DAILY. 90 tablet 3   No current facility-administered medications for this visit.     Allergies  Allergen Reactions  . Sulfa Antibiotics Hives and Swelling  . Vicodin [Hydrocodone-Acetaminophen] Itching    Social History   Social History  . Marital status: Single    Spouse name: N/A  . Number of children: 1  . Years of education: N/A   Occupational History  . work as truck Environmental health practitioner Tobacco   Social History Main Topics  . Smoking status: Current Every Day Smoker    Packs/day: 0.50    Types: Cigarettes  . Smokeless tobacco: Never Used  . Alcohol use 1.5 oz/week    3 drink(s) per week     Comment: occaisionally  . Drug use: No  . Sexual activity: Not on file   Other Topics Concern  . Not on file   Social History Narrative  . No narrative on file     Review  of Systems: General: negative for chills, fever, night sweats or weight changes.  Cardiovascular: negative for chest pain, dyspnea on exertion, edema, orthopnea, palpitations, paroxysmal nocturnal dyspnea or shortness of breath Dermatological: negative for rash Respiratory: negative for cough or wheezing Urologic: negative for hematuria Abdominal: negative for nausea, vomiting, diarrhea, bright red blood per rectum, melena, or hematemesis Neurologic: negative for visual changes, syncope, or dizziness All other systems reviewed and are otherwise negative except as noted above.    Blood pressure 124/60, pulse 72, height 6\' 1"  (1.854 m), weight 181 lb (82.1 kg).  General  appearance: alert and no distress Neck: no adenopathy, no carotid bruit, no JVD, supple, symmetrical, trachea midline and thyroid not enlarged, symmetric, no tenderness/mass/nodules Lungs: clear to auscultation bilaterally Heart: regular rate and rhythm, S1, S2 normal, no murmur, click, rub or gallop Extremities: extremities normal, atraumatic, no cyanosis or edema  EKG normal sinus rhythm 72 with evidence of LVH and nonspecific ST-T wave changes. I personally reviewed this EKG  ASSESSMENT AND PLAN:   Hyperlipidemia History of hyperlipidemia on statin therapy with recent lipid profile performed 12/11/15 revealed total cholesterol of 51, LDL 75 and HDL of 55  Essential hypertension History of hypertension blood pressure measured today at 124/60. He is on amlodipine, an Isuprel, chloride down, hydralazine and Bystolic . Continue current meds at current dosing  Tobacco abuse History of continued tobacco abuse one half pack per day recalcitrant risk factor modification      Lorretta Harp MD [facility], [facility] 12/25/2015 3:21 PM

## 2015-12-25 NOTE — Addendum Note (Signed)
Addended by: Vanessa Ralphs on: 12/25/2015 03:36 PM   Modules accepted: Orders

## 2015-12-25 NOTE — Patient Instructions (Signed)
Medication Instructions:  NO CHANGES.   Follow-Up: Your physician wants you to follow-up in: 12 MONTHS WITH DR BERRY.  You will receive a reminder letter in the mail two months in advance. If you don't receive a letter, please call our office to schedule the follow-up appointment.   If you need a refill on your cardiac medications before your next appointment, please call your pharmacy.   

## 2016-01-06 IMAGING — US US THYROID
1 series · 13 of 25 positions shown · non-contrast
Comparison: [DATE]

CLINICAL DATA: Goiter.

EXAM:
THYROID ULTRASOUND
TECHNIQUE: Ultrasound examination of the thyroid gland and adjacent soft
tissues was performed.

[Series 1: us thyroid · 0.04mm/px · 13 of 49 slices shown]
[im 1/49]
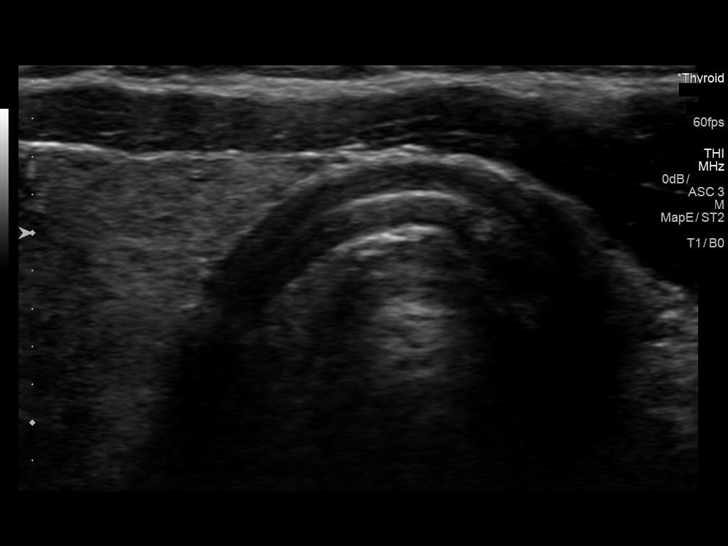
[im 5/49]
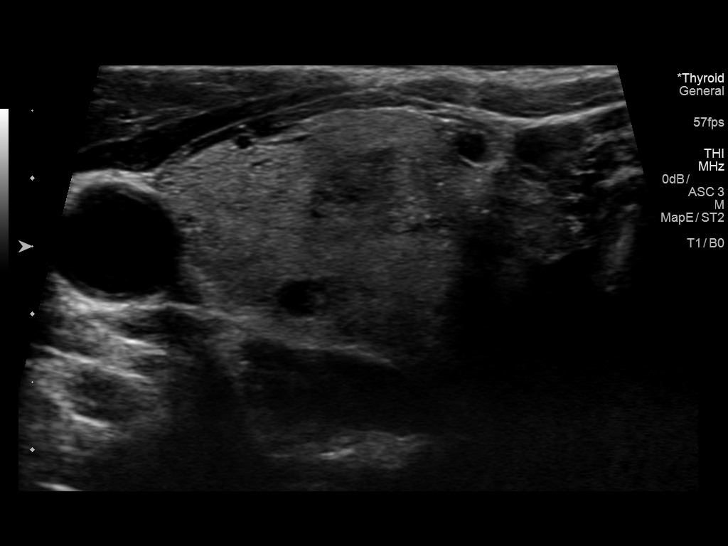
[im 9/49]
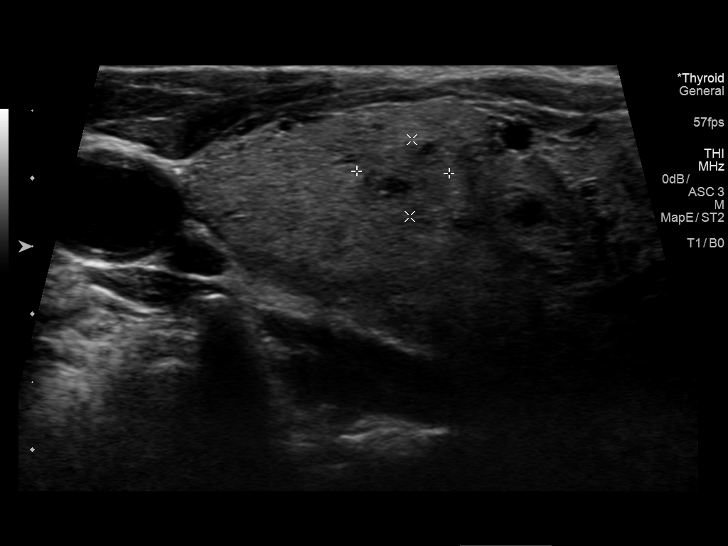
[im 13/49]
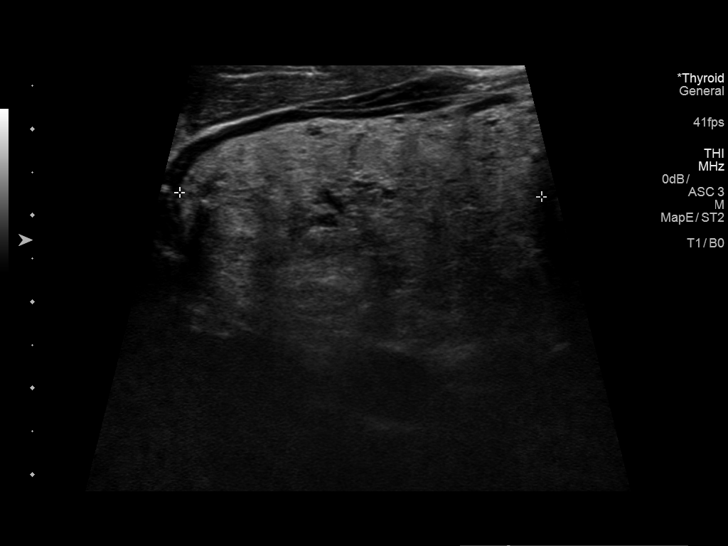
[im 17/49]
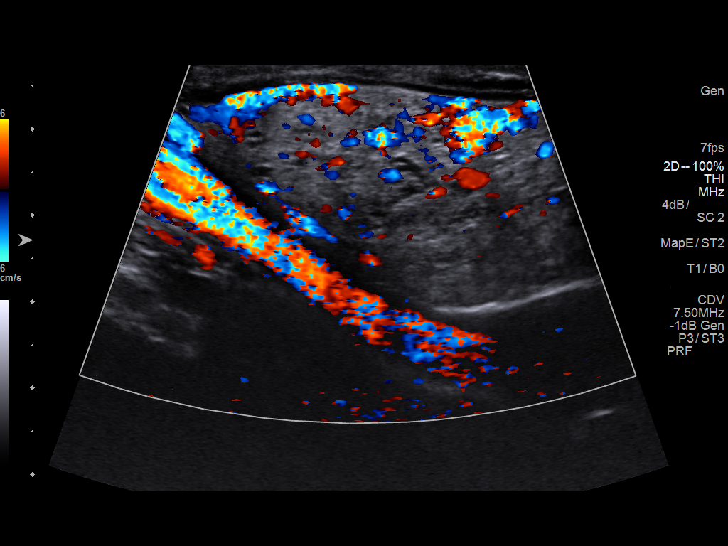
[im 21/49]
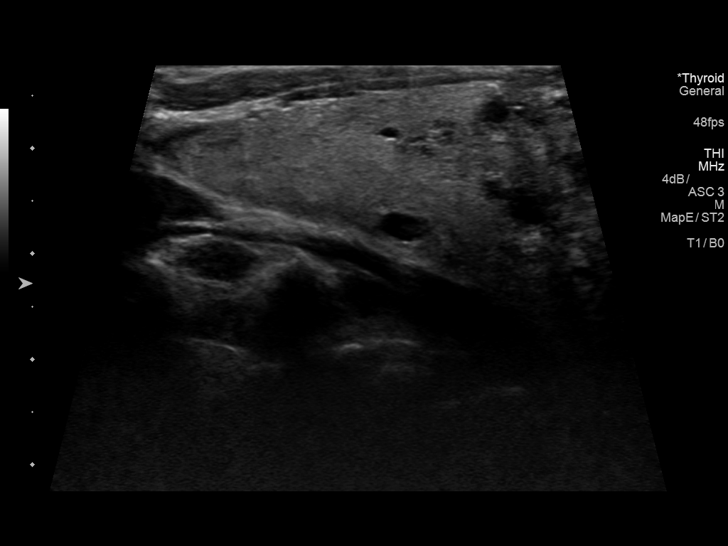
[im 25/49]
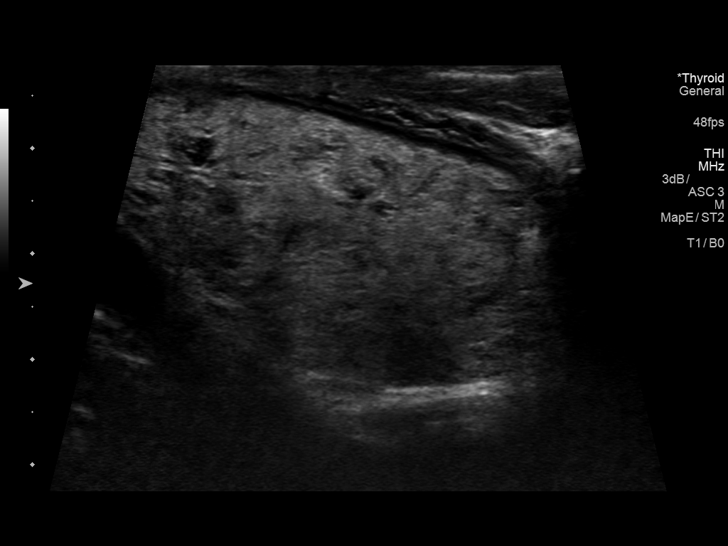
[im 29/49]
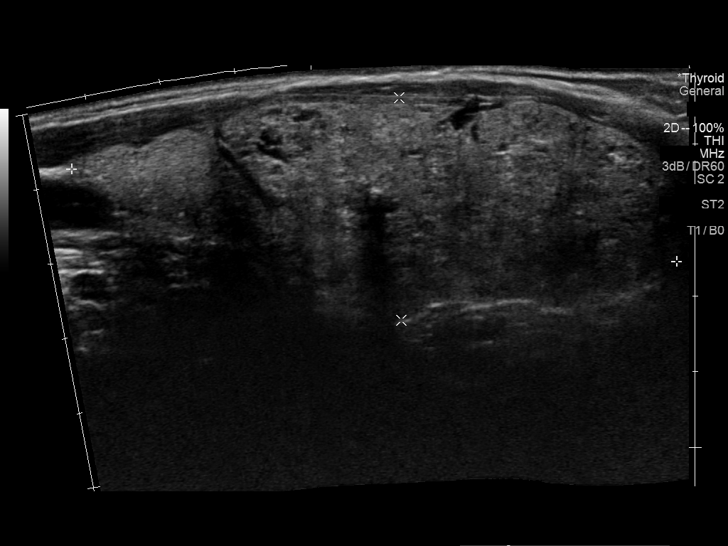
[im 33/49]
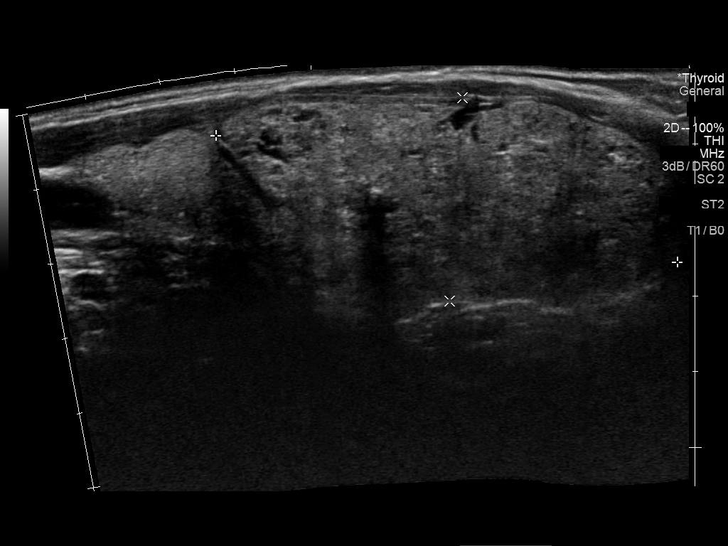
[im 37/49]
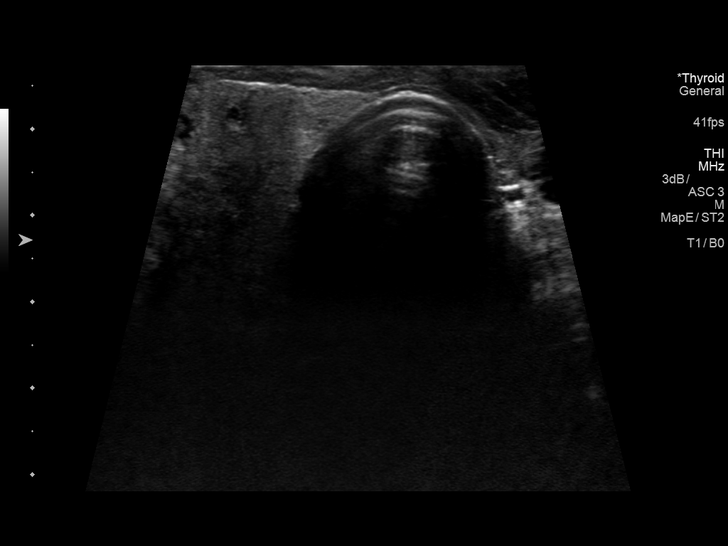
[im 41/49]
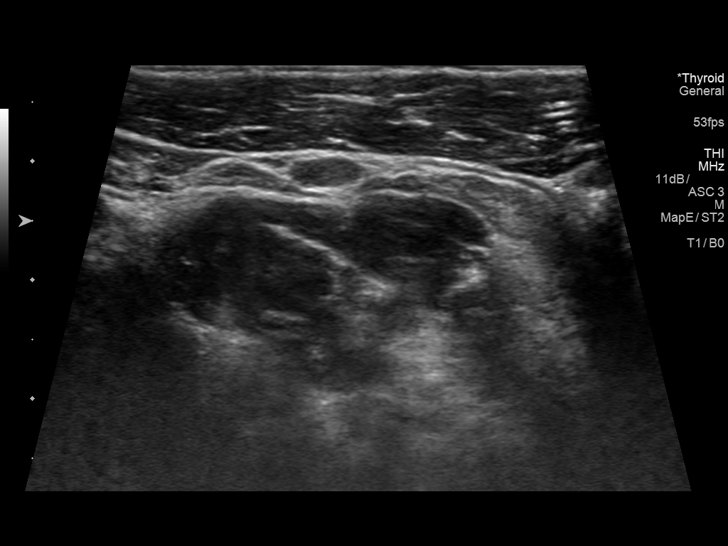
[im 45/49]
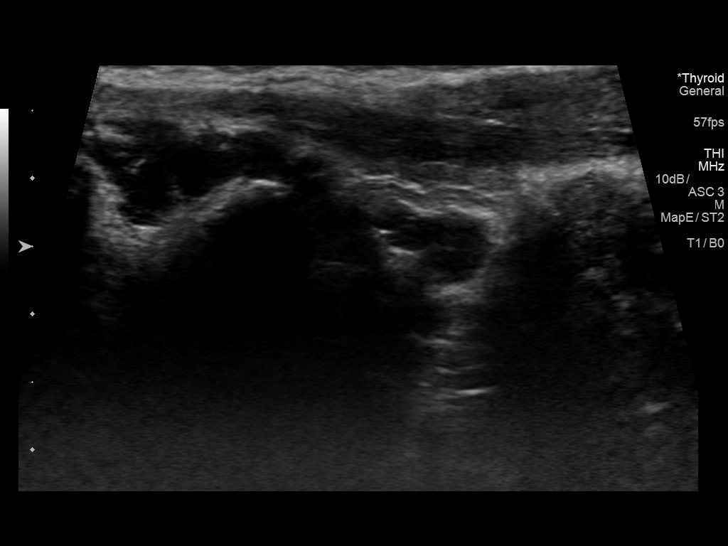
[im 49/49]
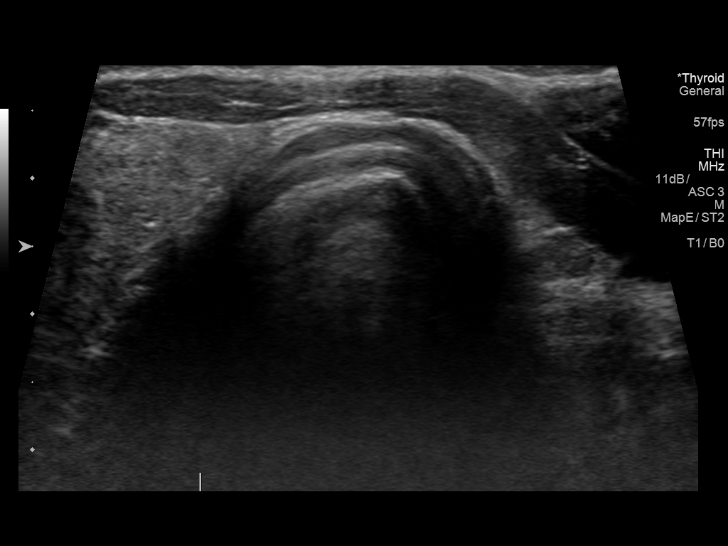

[13 of 25 positions shown; findings below may reference images not displayed]

FINDINGS: Parenchymal Echotexture: Markedly heterogenous

Isthmus: 0.4 cm, previously 0.6 cm

Right lobe: 8.1 x 2.9 x 4.7 cm, previously 8.8 x 3.6 x 4.6 cm

Left lobe: There is soft tissue in the left thyroid bed measuring
1.3 x 0.4 x 0.6 cm. Previously, there was no residual soft tissue in
the left thyroid bed.

_________________________________________________________

Estimated total number of nodules >/= 1 cm: 1

Number of spongiform nodules >/=  2 cm not described below (TR1): 0

Number of mixed cystic and solid nodules >/= 1.5 cm not described
below (TR2): 0

_________________________________________________________

Nodule # 1:

Prior biopsy: No

Location: Right; Superior

Maximum size: 0.8 cm; Other 2 dimensions: 0.7 x 0.6 cm, previously,
1.1 x 1.1 x 0.7 cm

Composition: solid/almost completely solid (2)

Echogenicity: hypoechoic (2)

Shape: not taller-than-wide (0)

Margins: smooth (0)

Echogenic foci: none (0)

ACR TI-RADS total points: 4.

ACR TI-RADS risk category:  TR4 (4-6 points).

Significant change in size (>/= 20% in two dimensions and minimal
increase of 2 mm): No

Change in features: No

Change in ACR TI-RADS risk category: No

ACR TI-RADS recommendations:

Given size (<0.9 cm) and appearance, this nodule does NOT meet
TI-RADS criteria for biopsy or dedicated follow-up.

_________________________________________________________

Nodule # 2:

Prior biopsy: No

Location: Right; Inferior

Maximum size: 6.3 cm; Other 2 dimensions: 2.9 x 4.7 cm, previously,
6.0 x 2.4 x 5.3 cm

Composition: solid/almost completely solid (2)

Echogenicity: isoechoic (1)

Shape: not taller-than-wide (0)

Margins: smooth (0)

Echogenic foci: punctate echogenic foci (3)

ACR TI-RADS total points: 6.

ACR TI-RADS risk category:  TR4 (4-6 points).

Significant change in size (>/= 20% in two dimensions and minimal
increase of 2 mm): No

Change in features: No

Change in ACR TI-RADS risk category: No

ACR TI-RADS recommendations:

**Given size (>/= 1.5 cm) and appearance, fine needle aspiration of
this moderately suspicious nodule should be considered based on
TI-RADS criteria.

_________________________________________________________
IMPRESSION: Previously, there was no role tissue in the left thyroid bed after
hemithyroidectomy. Now all, there is soft tissue in the left thyroid
bed measuring 1.3 x 0.4 x 0.6 cm. If the patient has a history of
malignancy, recurrent disease in the left thyroid bed cannot be
excluded.

Right lower pole nodule 2 meets criteria for fine needle aspiration
biopsy. It is not significantly changed.

The above is in keeping with the ACR TI-RADS recommendations - [HOSPITAL] [V0];[DATE].

## 2016-01-09 ENCOUNTER — Other Ambulatory Visit: Payer: Self-pay | Admitting: Cardiovascular Disease

## 2016-01-09 ENCOUNTER — Other Ambulatory Visit: Payer: Self-pay | Admitting: *Deleted

## 2016-01-09 ENCOUNTER — Other Ambulatory Visit: Payer: Self-pay | Admitting: Internal Medicine

## 2016-01-09 MED ORDER — CHLORTHALIDONE 25 MG PO TABS: 25.0000 mg | ORAL_TABLET | Freq: Every morning | ORAL | 11 refills | Status: DC

## 2016-01-23 ENCOUNTER — Other Ambulatory Visit: Payer: Self-pay | Admitting: Internal Medicine

## 2016-04-01 ENCOUNTER — Ambulatory Visit (INDEPENDENT_AMBULATORY_CARE_PROVIDER_SITE_OTHER): Payer: 59 | Admitting: Podiatry

## 2016-04-01 ENCOUNTER — Encounter: Payer: Self-pay | Admitting: Podiatry

## 2016-04-01 VITALS — Ht 73.0 in | Wt 185.0 lb

## 2016-04-01 DIAGNOSIS — M205X9 Other deformities of toe(s) (acquired), unspecified foot: Secondary | ICD-10-CM | POA: Diagnosis not present

## 2016-04-01 DIAGNOSIS — M2020 Hallux rigidus, unspecified foot: Principal | ICD-10-CM

## 2016-04-01 DIAGNOSIS — E119 Type 2 diabetes mellitus without complications: Secondary | ICD-10-CM

## 2016-04-01 NOTE — Progress Notes (Signed)
Subjective:     Patient ID: [name], male   DOB: 11-08-1960, 56 y.o.   MRN: [id number]  HPI this patient presents to the office for his yearly diabetic foot exam. He was seen last year and all was within normal limits except for a hallux limitus problem big toe joints both feet. He presents the office with no pain or discomfort noted in his feet   Review of Systems     Objective:   Physical Exam GENERAL APPEARANCE: Alert, conversant. Appropriately groomed. No acute distress.  VASCULAR: Pedal pulses palpable at  [facility] and PT bilateral.  Capillary refill time is immediate to all digits,  Normal temperature gradient.  Digital hair growth is present bilateral  NEUROLOGIC: sensation is normal to 5.07 monofilament at 5/5 sites bilateral.  Light touch is intact bilateral, Muscle strength normal.  MUSCULOSKELETAL: acceptable muscle strength, tone and stability bilateral.  Intrinsic muscluature intact bilateral.  Rectus appearance of foot and digits noted bilateral. Hallux limitus 1st MPJ B/L.  Pes planus foot type.  DERMATOLOGIC: skin color, texture, and turgor are within normal limits.  No preulcerative lesions or ulcers  are seen, no interdigital maceration noted.  No open lesions present.  Digital nails are asymptomatic. No drainage noted.      Assessment:     Hallux limitus B/L  Diabetes with no complications     Plan:     Diabetic foot exam.  RTC 1 year   Gardiner Barefoot DPM

## 2016-05-06 ENCOUNTER — Other Ambulatory Visit: Payer: Self-pay | Admitting: Internal Medicine

## 2016-06-13 ENCOUNTER — Other Ambulatory Visit: Payer: Self-pay | Admitting: Internal Medicine

## 2016-06-13 NOTE — Telephone Encounter (Signed)
Requested Prescriptions   Signed Prescriptions Disp Refills  . JANUVIA 100 MG tablet 90 tablet 1    Sig: TAKE 1 TABLET BY MOUTH DAILY    Authorizing Provider: Biagio Borg    Ordering User: Juliet Rude    Last OV 03/2016

## 2016-07-14 ENCOUNTER — Other Ambulatory Visit: Payer: Self-pay | Admitting: Internal Medicine

## 2016-07-28 ENCOUNTER — Other Ambulatory Visit: Payer: Self-pay | Admitting: Internal Medicine

## 2016-08-07 ENCOUNTER — Other Ambulatory Visit: Payer: Self-pay | Admitting: *Deleted

## 2016-08-07 MED ORDER — HYDRALAZINE HCL 50 MG PO TABS: 50.0000 mg | ORAL_TABLET | Freq: Three times a day (TID) | ORAL | 11 refills | Status: DC

## 2016-09-30 ENCOUNTER — Other Ambulatory Visit: Payer: 59

## 2016-10-23 ENCOUNTER — Other Ambulatory Visit: Payer: Self-pay | Admitting: Internal Medicine

## 2016-11-18 ENCOUNTER — Other Ambulatory Visit: Payer: Self-pay | Admitting: Internal Medicine

## 2016-11-21 ENCOUNTER — Other Ambulatory Visit: Payer: Self-pay | Admitting: Internal Medicine

## 2016-11-28 ENCOUNTER — Other Ambulatory Visit: Payer: Self-pay | Admitting: Internal Medicine

## 2016-12-03 ENCOUNTER — Other Ambulatory Visit: Payer: Self-pay | Admitting: Internal Medicine

## 2016-12-05 ENCOUNTER — Encounter: Payer: Self-pay | Admitting: Endocrinology

## 2016-12-05 ENCOUNTER — Ambulatory Visit (INDEPENDENT_AMBULATORY_CARE_PROVIDER_SITE_OTHER): Payer: 59 | Admitting: Endocrinology

## 2016-12-05 VITALS — BP 122/76 | HR 73 | Wt 184.4 lb

## 2016-12-05 DIAGNOSIS — E042 Nontoxic multinodular goiter: Secondary | ICD-10-CM | POA: Diagnosis not present

## 2016-12-05 LAB — TSH: TSH: 0.39 u[IU]/mL (ref 0.35–4.50)

## 2016-12-05 LAB — T4, FREE: FREE T4: 0.93 ng/dL (ref 0.60–1.60)

## 2016-12-05 NOTE — Patient Instructions (Signed)
Let's recheck the ultrasound.  you will receive a phone call, about a day and time for an appointment.   blood tests are requested for you today.  We'll let you know about the results.  Please return in 1 year.    

## 2016-12-05 NOTE — Progress Notes (Signed)
Subjective:    Patient ID: [name], male    DOB: [birth year], 56 y.o.   MRN: [id number]  HPI Pt returns for f/u of multinodular goiter (dx'ed 2016; nuc med scan and US showed multinodular goiter, right lobe >> left; bx in 2016 showed beth cat 2; he has been euthyroid; he has never been on thyroid medication).  He does not notice the goiter.   Past Medical History:  Diagnosis Date  . Chest pain 02/13/2012   echo - EF >55%;mod concentric L ventricular hypertrophy; atrial septum aneurysmal; patent foramen ovale suspected on color flow, LA mil/mod dilation; RV systolic pressure [id number]  . Chest pain 06/09/2007   echo - EF 45-50%; interarterial shunt; mild valvular regurgitation;   . Chest pain 06/09/2007   Myoview - EF 53%; normal perfusion all regions;EKG negative for ischemia   . Diabetes mellitus    type II  . DIABETES MELLITUS, TYPE II 08/29/2009  . GERD (gastroesophageal reflux disease) 08/12/2010  . Hemorrhoids   . History of anal fissures   . Hyperlipidemia   . HYPERLIPIDEMIA 08/29/2009  . Hypertension   . HYPERTENSION 01/14/2008  . Lightheadedness 05/10/2002   30d monitor unremarkable  . LVH (left ventricular hypertrophy)    with systolic dysfunction by echo 10/03, EF 45-50% - Dr. Melvern Banker  . Multinodular goiter 12/06/2015  . Nonspecific ST-T wave electrocardiographic changes 08/11/2006   Myoview - EF 49%; normal perfusion all regions, EKG negative for ischemia  . OSA (obstructive sleep apnea)   . Sleep apnea 08/13/2008   AHI 1.2; sleep study 10/2007 - AHI 9.47at 15cm H2O and 31.3 at 16cm H2O  . SLEEP APNEA, OBSTRUCTIVE 01/14/2008    Past Surgical History:  Procedure Laterality Date  . ANAL SPHINCTEROTOMY  08/20/11  . CARDIAC CATHETERIZATION  10/23/2008   normal coronary arteries and LV function  . COLONOSCOPY    . POLYPECTOMY      Social History   Social History  . Marital status: Single    Spouse name: N/A  . Number of children: 1  . Years of education: N/A    Occupational History  . work as truck Environmental health practitioner Tobacco   Social History Main Topics  . Smoking status: Current Every Day Smoker    Packs/day: 0.50    Types: Cigarettes  . Smokeless tobacco: Never Used  . Alcohol use 1.5 oz/week    3 drink(s) per week     Comment: occaisionally  . Drug use: No  . Sexual activity: Not on file   Other Topics Concern  . Not on file   Social History Narrative  . No narrative on file    Current Outpatient Prescriptions on File Prior to Visit  Medication Sig Dispense Refill  . amLODipine-benazepril (LOTREL) 10-40 MG capsule TAKE 1 CAPSULE BY MOUTH DAILY. 90 capsule 1  . aspirin 81 MG EC tablet Take 81 mg by mouth daily.      Marland Kitchen BYSTOLIC 10 MG tablet TAKE 4 TABLETS (40MG  TOTAL) BY MOUTH DAILY. 120 tablet 0  . chlorthalidone (HYGROTON) 25 MG tablet Take 1 tablet (25 mg total) by mouth every morning. 30 tablet 11  . Docusate Calcium ([employer] STOOL SOFTENER PO) Take 2 tablets by mouth 2 (two) times daily.     . hydrALAZINE (APRESOLINE) 50 MG tablet Take 1 tablet (50 mg total) by mouth 3 (three) times daily. 270 tablet 11  . ibuprofen (ADVIL,MOTRIN) 200 MG tablet Take 400 mg by mouth every 6 (six) hours as needed. Reported  on 06/01/2015    . JANUVIA 100 MG tablet TAKE 1 TABLET BY MOUTH DAILY 90 tablet 1  . Lancets (ONETOUCH ULTRASOFT) lancets TEST AS DIRECTED 100 each 0  . metFORMIN (GLUCOPHAGE) 500 MG tablet TAKE 2 TABLETS BY MOUTH TWICE DAILY 120 tablet 5  . nebivolol (BYSTOLIC) 10 MG tablet Take 20 mg by mouth 2 (two) times daily.    Marland Kitchen omeprazole (PRILOSEC) 20 MG capsule TAKE 1 CAPSULE EVERY DAY *NEEDS APPT FOR FURTHER REFILLS* 90 capsule 3  . ONE TOUCH ULTRA TEST test strip TEST THREE TIMES A DAY AS INSTRUCTED. 100 each 1  . potassium chloride (KLOR-CON 10) 10 MEQ tablet Take 2 tablets (20 mEq total) by mouth daily. **PT NEEDS A PHYSICAL FOR ADDITIONAL REFILLS** 180 tablet 0  . pravastatin (PRAVACHOL) 40 MG tablet Take 1 tablet (40 mg total) by  mouth daily. Annual appt due in Sept must see provider for future refills 30 tablet 0   No current facility-administered medications on file prior to visit.     Allergies  Allergen Reactions  . Sulfa Antibiotics Hives and Swelling  . Vicodin [Hydrocodone-Acetaminophen] Itching    Family History  Problem Relation Age of Onset  . Hypertension Mother   . Heart disease Father   . Colon cancer Neg Hx   . Thyroid disease Neg Hx     BP 122/76   Pulse 73   Wt 184 lb 6.4 oz (83.6 kg)   SpO2 97%   BMI 24.33 kg/m    Review of Systems No weight change    Objective:   Physical Exam VITAL SIGNS:  See vs page.   GENERAL: no distress.   NECK: the right thyroid mass is again easily palpable.        Assessment & Plan:  Multinodular goiter: due for recheck  Patient Instructions  Let's recheck the ultrasound.  you will receive a phone call, about a day and time for an appointment. blood tests are requested for you today.  We'll let you know about the results. Please return in 1 year.

## 2016-12-10 ENCOUNTER — Telehealth: Payer: Self-pay | Admitting: Endocrinology

## 2016-12-10 DIAGNOSIS — E042 Nontoxic multinodular goiter: Secondary | ICD-10-CM

## 2016-12-10 NOTE — Telephone Encounter (Signed)
done

## 2016-12-10 NOTE — Telephone Encounter (Signed)
[facility] called and needs the orders for the patient changed: The soft tissue head and neck non thyroid order needs to be thyroid ultasound [id number]

## 2016-12-11 ENCOUNTER — Ambulatory Visit (INDEPENDENT_AMBULATORY_CARE_PROVIDER_SITE_OTHER): Payer: 59 | Admitting: Internal Medicine

## 2016-12-11 ENCOUNTER — Other Ambulatory Visit (INDEPENDENT_AMBULATORY_CARE_PROVIDER_SITE_OTHER): Payer: 59

## 2016-12-11 ENCOUNTER — Encounter: Payer: Self-pay | Admitting: Internal Medicine

## 2016-12-11 VITALS — BP 130/88 | HR 70 | Temp 98.5°F | Ht 73.0 in | Wt 183.0 lb

## 2016-12-11 DIAGNOSIS — Z0000 Encounter for general adult medical examination without abnormal findings: Secondary | ICD-10-CM | POA: Diagnosis not present

## 2016-12-11 DIAGNOSIS — Z23 Encounter for immunization: Secondary | ICD-10-CM | POA: Diagnosis not present

## 2016-12-11 DIAGNOSIS — Z794 Long term (current) use of insulin: Secondary | ICD-10-CM

## 2016-12-11 DIAGNOSIS — E0800 Diabetes mellitus due to underlying condition with hyperosmolarity without nonketotic hyperglycemic-hyperosmolar coma (NKHHC): Secondary | ICD-10-CM | POA: Diagnosis not present

## 2016-12-11 DIAGNOSIS — Z1159 Encounter for screening for other viral diseases: Secondary | ICD-10-CM | POA: Diagnosis not present

## 2016-12-11 DIAGNOSIS — I10 Essential (primary) hypertension: Secondary | ICD-10-CM

## 2016-12-11 LAB — CBC WITH DIFFERENTIAL/PLATELET
BASOS PCT: 1.3 % (ref 0.0–3.0)
Basophils Absolute: 0.1 10*3/uL (ref 0.0–0.1)
EOS ABS: 0.3 10*3/uL (ref 0.0–0.7)
EOS PCT: 2.7 % (ref 0.0–5.0)
HEMATOCRIT: 43.1 % (ref 39.0–52.0)
Hemoglobin: 14.3 g/dL (ref 13.0–17.0)
Lymphocytes Relative: 22 % (ref 12.0–46.0)
Lymphs Abs: 2.1 10*3/uL (ref 0.7–4.0)
MCHC: 33.2 g/dL (ref 30.0–36.0)
MCV: 100.1 fl — ABNORMAL HIGH (ref 78.0–100.0)
MONO ABS: 1 10*3/uL (ref 0.1–1.0)
Monocytes Relative: 10.9 % (ref 3.0–12.0)
NEUTROS ABS: 6 10*3/uL (ref 1.4–7.7)
Neutrophils Relative %: 63.1 % (ref 43.0–77.0)
PLATELETS: 217 10*3/uL (ref 150.0–400.0)
RBC: 4.3 Mil/uL (ref 4.22–5.81)
RDW: 13 % (ref 11.5–15.5)
WBC: 9.6 10*3/uL (ref 4.0–10.5)

## 2016-12-11 LAB — LIPID PANEL
CHOL/HDL RATIO: 2
Cholesterol: 165 mg/dL (ref 0–200)
HDL: 69 mg/dL (ref >39.00)
LDL Cholesterol: 66 mg/dL (ref 0–99)
NONHDL: 95.98
Triglycerides: 149 mg/dL (ref 0.0–149.0)
VLDL: 29.8 mg/dL (ref 0.0–40.0)

## 2016-12-11 LAB — URINALYSIS, ROUTINE W REFLEX MICROSCOPIC
Bilirubin Urine: NEGATIVE
Hgb urine dipstick: NEGATIVE
Ketones, ur: NEGATIVE
Leukocytes, UA: NEGATIVE
NITRITE: NEGATIVE
RBC / HPF: NONE SEEN (ref 0–?)
Total Protein, Urine: NEGATIVE
URINE GLUCOSE: NEGATIVE
Urobilinogen, UA: 0.2 (ref 0.0–1.0)
WBC, UA: NONE SEEN (ref 0–?)
pH: 6 (ref 5.0–8.0)

## 2016-12-11 LAB — MICROALBUMIN / CREATININE URINE RATIO
Creatinine,U: 229.4 mg/dL
MICROALB UR: 2.8 mg/dL — AB (ref 0.0–1.9)
MICROALB/CREAT RATIO: 1.2 mg/g (ref 0.0–30.0)

## 2016-12-11 LAB — TSH: TSH: 0.45 u[IU]/mL (ref 0.35–4.50)

## 2016-12-11 LAB — BASIC METABOLIC PANEL
BUN: 20 mg/dL (ref 6–23)
CALCIUM: 10.7 mg/dL — AB (ref 8.4–10.5)
CHLORIDE: 99 meq/L (ref 96–112)
CO2: 31 mEq/L (ref 19–32)
CREATININE: 1.36 mg/dL (ref 0.40–1.50)
GFR: 69.32 mL/min (ref >60.00)
Glucose, Bld: 100 mg/dL — ABNORMAL HIGH (ref 70–99)
Potassium: 3.5 mEq/L (ref 3.5–5.1)
Sodium: 141 mEq/L (ref 135–145)

## 2016-12-11 LAB — HEPATIC FUNCTION PANEL
ALT: 26 U/L (ref 0–53)
AST: 23 U/L (ref 0–37)
Albumin: 4.8 g/dL (ref 3.5–5.2)
Alkaline Phosphatase: 55 U/L (ref 39–117)
BILIRUBIN DIRECT: 0.1 mg/dL (ref 0.0–0.3)
Total Bilirubin: 0.5 mg/dL (ref 0.2–1.2)
Total Protein: 7.2 g/dL (ref 6.0–8.3)

## 2016-12-11 LAB — PSA: PSA: 0.36 ng/mL (ref 0.10–4.00)

## 2016-12-11 LAB — HEMOGLOBIN A1C: Hgb A1c MFr Bld: 5.8 % (ref 4.6–6.5)

## 2016-12-11 MED ORDER — LANCETS MISC: 11 refills | Status: DC

## 2016-12-11 MED ORDER — GLUCOSE BLOOD VI STRP: ORAL_STRIP | 11 refills | Status: DC

## 2016-12-11 MED ORDER — SITAGLIPTIN PHOSPHATE 100 MG PO TABS: 100.0000 mg | ORAL_TABLET | Freq: Every day | ORAL | 3 refills | Status: DC

## 2016-12-11 MED ORDER — NEBIVOLOL HCL 10 MG PO TABS: 20.0000 mg | ORAL_TABLET | Freq: Two times a day (BID) | ORAL | 3 refills | Status: DC

## 2016-12-11 MED ORDER — PRAVASTATIN SODIUM 40 MG PO TABS: 40.0000 mg | ORAL_TABLET | Freq: Every day | ORAL | 3 refills | Status: DC

## 2016-12-11 MED ORDER — ONETOUCH ULTRA 2 W/DEVICE KIT: PACK | 0 refills | Status: DC

## 2016-12-11 MED ORDER — OMEPRAZOLE 20 MG PO CPDR: DELAYED_RELEASE_CAPSULE | ORAL | 3 refills | Status: DC

## 2016-12-11 MED ORDER — HYDRALAZINE HCL 50 MG PO TABS: 50.0000 mg | ORAL_TABLET | Freq: Three times a day (TID) | ORAL | 3 refills | Status: DC

## 2016-12-11 MED ORDER — CHLORTHALIDONE 25 MG PO TABS: 25.0000 mg | ORAL_TABLET | Freq: Every morning | ORAL | 3 refills | Status: DC

## 2016-12-11 MED ORDER — AMLODIPINE BESY-BENAZEPRIL HCL 10-40 MG PO CAPS: 1.0000 | ORAL_CAPSULE | Freq: Every day | ORAL | 3 refills | Status: DC

## 2016-12-11 MED ORDER — POTASSIUM CHLORIDE ER 10 MEQ PO TBCR
20.0000 meq | EXTENDED_RELEASE_TABLET | Freq: Every day | ORAL | 3 refills | Status: DC
Start: 2016-12-11 — End: 2017-06-10

## 2016-12-11 MED ORDER — METFORMIN HCL 500 MG PO TABS: 1000.0000 mg | ORAL_TABLET | Freq: Two times a day (BID) | ORAL | 3 refills | Status: DC

## 2016-12-11 NOTE — Patient Instructions (Addendum)
You had the flu shot today  You will be contacted regarding the referral for: Eye doctor  Please continue all other medications as before, and refills have been done if requested.  Please have the pharmacy call with any other refills you may need.  Please continue your efforts at being more active, low cholesterol diet, and weight control.  You are otherwise up to date with prevention measures today.  Please keep your appointments with your specialists as you may have planned  Please go to the LAB in the Basement (turn left off the elevator) for the tests to be done today  You will be contacted by phone if any changes need to be made immediately.  Otherwise, you will receive a letter about your results with an explanation, but please check with MyChart first.  Please remember to sign up for MyChart if you have not done so, as this will be important to you in the future with finding out test results, communicating by private email, and scheduling acute appointments online when needed.  Please return in 6 months, or sooner if needed, with Lab testing done 3-5 days before

## 2016-12-11 NOTE — Assessment & Plan Note (Signed)
stable overall by history and exam, recent data reviewed with pt, and pt to continue medical treatment as before,  to f/u any worsening symptoms or concerns BP Readings from Last 3 Encounters:  12/11/16 130/88  12/05/16 122/76  12/25/15 124/60

## 2016-12-11 NOTE — Assessment & Plan Note (Signed)
Lab Results  Component Value Date   HGBA1C 5.8 12/11/2016  stable overall by history and exam, recent data reviewed with pt, and pt to continue medical treatment as before,  to f/u any worsening symptoms or concerns

## 2016-12-11 NOTE — Assessment & Plan Note (Signed)

## 2016-12-11 NOTE — Progress Notes (Signed)
Subjective:    Patient ID: [name], male    DOB: 02-26-1961, 56 y.o.   MRN: [id number]  HPI Here for wellness and f/u;  Overall doing ok;  Pt denies Chest pain, worsening SOB, DOE, wheezing, orthopnea, PND, worsening LE edema, palpitations, dizziness or syncope.  Pt denies neurological change such as new headache, facial or extremity weakness.  Pt denies polydipsia, polyuria, or low sugar symptoms. Pt states overall good compliance with treatment and medications, good tolerability, and has been trying to follow appropriate diet.  Pt denies worsening depressive symptoms, suicidal ideation or panic. No fever, night sweats, wt loss, loss of appetite, or other constitutional symptoms.  Pt states good ability with ADL's, has low fall risk, home safety reviewed and adequate, no other significant changes in hearing or vision, and only occasionally active with exercise.  CBG's in low 100;'s. No new complaints Wt Readings from Last 3 Encounters:  12/11/16 183 lb (83 kg)  12/05/16 184 lb 6.4 oz (83.6 kg)  04/01/16 185 lb (83.9 kg)  has seen endo with good TFT's, now for f/iu thyroid u/s.   Still smoking, but plans to try to quit.  Past Medical History:  Diagnosis Date  . Chest pain 02/13/2012   echo - EF >55%;mod concentric L ventricular hypertrophy; atrial septum aneurysmal; patent foramen ovale suspected on color flow, LA mil/mod dilation; RV systolic pressure [id number]  . Chest pain 06/09/2007   echo - EF 45-50%; interarterial shunt; mild valvular regurgitation;   . Chest pain 06/09/2007   Myoview - EF 53%; normal perfusion all regions;EKG negative for ischemia   . Diabetes mellitus    type II  . DIABETES MELLITUS, TYPE II 08/29/2009  . GERD (gastroesophageal reflux disease) 08/12/2010  . Hemorrhoids   . History of anal fissures   . Hyperlipidemia   . HYPERLIPIDEMIA 08/29/2009  . Hypertension   . HYPERTENSION 01/14/2008  . Lightheadedness 05/10/2002   30d monitor unremarkable  . LVH (left  ventricular hypertrophy)    with systolic dysfunction by echo 10/03, EF 45-50% - Dr. Melvern Banker  . Multinodular goiter 12/06/2015  . Nonspecific ST-T wave electrocardiographic changes 08/11/2006   Myoview - EF 49%; normal perfusion all regions, EKG negative for ischemia  . OSA (obstructive sleep apnea)   . Sleep apnea 08/13/2008   AHI 1.2; sleep study 10/2007 - AHI 9.47at 15cm H2O and 31.3 at 16cm H2O  . SLEEP APNEA, OBSTRUCTIVE 01/14/2008   Past Surgical History:  Procedure Laterality Date  . ANAL SPHINCTEROTOMY  08/20/11  . CARDIAC CATHETERIZATION  10/23/2008   normal coronary arteries and LV function  . COLONOSCOPY    . POLYPECTOMY      reports that he has been smoking Cigarettes.  He has been smoking about 0.50 packs per day. He has never used smokeless tobacco. He reports that he drinks about 1.5 oz of alcohol per week . He reports that he does not use drugs. family history includes Heart disease in his father; Hypertension in his mother. Allergies  Allergen Reactions  . Sulfa Antibiotics Hives and Swelling  . Vicodin [Hydrocodone-Acetaminophen] Itching   Current Outpatient Prescriptions on File Prior to Visit  Medication Sig Dispense Refill  . aspirin 81 MG EC tablet Take 81 mg by mouth daily.      Marland Kitchen BYSTOLIC 10 MG tablet TAKE 4 TABLETS (40MG  TOTAL) BY MOUTH DAILY. 120 tablet 0  . Docusate Calcium ([employer] STOOL SOFTENER PO) Take 2 tablets by mouth 2 (two) times daily.     Marland Kitchen  ibuprofen (ADVIL,MOTRIN) 200 MG tablet Take 400 mg by mouth every 6 (six) hours as needed. Reported on 06/01/2015     No current facility-administered medications on file prior to visit.    Review of Systems Constitutional: Negative for other unusual diaphoresis, sweats, appetite or weight changes HENT: Negative for other worsening hearing loss, ear pain, facial swelling, mouth sores or neck stiffness.   Eyes: Negative for other worsening pain, redness or other visual disturbance.  Respiratory: Negative for other  stridor or swelling Cardiovascular: Negative for other palpitations or other chest pain  Gastrointestinal: Negative for worsening diarrhea or loose stools, blood in stool, distention or other pain Genitourinary: Negative for hematuria, flank pain or other change in urine volume.  Musculoskeletal: Negative for myalgias or other joint swelling.  Skin: Negative for other color change, or other wound or worsening drainage.  Neurological: Negative for other syncope or numbness. Hematological: Negative for other adenopathy or swelling Psychiatric/Behavioral: Negative for hallucinations, other worsening agitation, SI, self-injury, or new decreased concentration All other exam findings    Objective:   Physical Exam BP 130/88   Pulse 70   Temp 98.5 F (36.9 C) (Oral)   Ht 6\' 1"  (1.854 m)   Wt 183 lb (83 kg)   SpO2 100%   BMI 24.14 kg/m  VS noted,  Constitutional: Pt is oriented to person, place, and time. Appears well-developed and well-nourished, in no significant distress and comfortable Head: Normocephalic and atraumatic  Eyes: Conjunctivae and EOM are normal. Pupils are equal, round, and reactive to light Right Ear: External ear normal without discharge Left Ear: External ear normal without discharge Nose: Nose without discharge or deformity Mouth/Throat: Oropharynx is without other ulcerations and moist  Neck: Normal range of motion. Neck supple. No JVD present. No tracheal deviation present or significant neck LA or mass Cardiovascular: Normal rate, regular rhythm, normal heart sounds and intact distal pulses.   Pulmonary/Chest: WOB normal and breath sounds without rales or wheezing  Abdominal: Soft. Bowel sounds are normal. NT. No HSM  Musculoskeletal: Normal range of motion. Exhibits no edema Lymphadenopathy: Has no other cervical adenopathy.  Neurological: Pt is alert and oriented to person, place, and time. Pt has normal reflexes. No cranial nerve deficit. Motor grossly intact,  Gait intact Skin: Skin is warm and dry. No rash noted or new ulcerations Psychiatric:  Has normal mood and affect. Behavior is normal without agitation No other exam findings Lab Results  Component Value Date   WBC 9.6 12/11/2016   HGB 14.3 12/11/2016   HCT 43.1 12/11/2016   PLT 217.0 12/11/2016   GLUCOSE 100 (H) 12/11/2016   CHOL 165 12/11/2016   TRIG 149.0 12/11/2016   HDL 69.00 12/11/2016   LDLDIRECT 117.9 01/14/2008   LDLCALC 66 12/11/2016   ALT 26 12/11/2016   AST 23 12/11/2016   NA 141 12/11/2016   K 3.5 12/11/2016   CL 99 12/11/2016   CREATININE 1.36 12/11/2016   BUN 20 12/11/2016   CO2 31 12/11/2016   TSH 0.45 12/11/2016   PSA 0.36 12/11/2016   INR 1.09 08/05/2010   HGBA1C 5.8 12/11/2016   MICROALBUR 2.8 (H) 12/11/2016      Assessment & Plan:

## 2016-12-12 LAB — HIV ANTIBODY (ROUTINE TESTING W REFLEX): HIV: NONREACTIVE

## 2016-12-18 ENCOUNTER — Ambulatory Visit
Admission: RE | Admit: 2016-12-18 | Discharge: 2016-12-18 | Disposition: A | Discharge status: Home / Self Care | Payer: 59 | Source: Ambulatory Visit | Attending: Endocrinology | Admitting: Endocrinology

## 2016-12-18 DIAGNOSIS — E042 Nontoxic multinodular goiter: Secondary | ICD-10-CM

## 2016-12-18 DIAGNOSIS — E041 Nontoxic single thyroid nodule: Secondary | ICD-10-CM | POA: Diagnosis not present

## 2016-12-19 ENCOUNTER — Inpatient Hospital Stay: Admission: RE | Admit: 2016-12-19 | Payer: 59 | Source: Ambulatory Visit

## 2016-12-26 ENCOUNTER — Encounter: Payer: Self-pay | Admitting: Cardiovascular Disease

## 2016-12-26 ENCOUNTER — Ambulatory Visit (INDEPENDENT_AMBULATORY_CARE_PROVIDER_SITE_OTHER): Payer: 59 | Admitting: Cardiovascular Disease

## 2016-12-26 VITALS — BP 114/72 | HR 66 | Ht 73.0 in | Wt 181.0 lb

## 2016-12-26 DIAGNOSIS — I10 Essential (primary) hypertension: Secondary | ICD-10-CM | POA: Diagnosis not present

## 2016-12-26 DIAGNOSIS — Z720 Tobacco use: Secondary | ICD-10-CM | POA: Diagnosis not present

## 2016-12-26 NOTE — Assessment & Plan Note (Signed)
History of essential hypertension blood pressure measures 114/72. He is on by Bystolic , chlorthalidone, hydralazine. Continue current meds at current dosing

## 2016-12-26 NOTE — Progress Notes (Signed)
12/26/2016 [name]   Jun 26, 1960  [id number]  Primary Physician Jonathan Borg, MD Primary Cardiologist: Jonathan Harp MD [name]  HPI:  [name] is a 56 y.o. male mildly overweight, single [place] American male, father of 61, grandfather to 2 grandchildren who works as a Administrator for [employer]. I saw him in the office 12/12/14. His risk factors include tobacco abuse, hypertension and dyslipidemia. I catheterized him 12/25/15 October 23, 2008, revealing normal coronary arteries and normal LV function. I felt his chest pain symptoms were related to reflux. Dr. Jenny Perry has been adjusting antihypertensive medications. He is aware of salt intake. He does have significant LVH on his EKG with a past echo performed 2 years ago revealed moderate concentric LVH with normal LV function..Since I saw him a year ago he is completely asymptomatic. His blood pressure is in the much better control and the addition of hydralazine. His most recent lipid profile performed  12/11/16 revealed total cholesterol 165, LDL 66 and HDL of 69..   Current Meds  Medication Sig  . amLODipine-benazepril (LOTREL) 10-40 MG capsule Take 1 capsule by mouth daily.  Marland Kitchen aspirin 81 MG EC tablet Take 81 mg by mouth daily.    . Blood Glucose Monitoring Suppl (ONE TOUCH ULTRA 2) w/Device KIT Use as directed  I95.1  . BYSTOLIC 10 MG tablet TAKE 4 TABLETS (40MG TOTAL) BY MOUTH DAILY.  . chlorthalidone (HYGROTON) 25 MG tablet Take 1 tablet (25 mg total) by mouth every morning.  [name] Calcium ([employer]S STOOL SOFTENER PO) Take 2 tablets by mouth 2 (two) times daily.   Marland Kitchen glucose blood (ONE TOUCH ULTRA TEST) test strip TEST THREE TIMES A DAY AS INSTRUCTED.  E11.9  . hydrALAZINE (APRESOLINE) 50 MG tablet Take 1 tablet (50 mg total) by mouth 3 (three) times daily.  Marland Kitchen ibuprofen (ADVIL,MOTRIN) 200 MG tablet Take 400 mg by mouth every 6 (six) hours as needed. Reported on 06/01/2015  . Lancets MISC Use as  directed E11.9  . metFORMIN (GLUCOPHAGE) 500 MG tablet Take 2 tablets (1,000 mg total) by mouth 2 (two) times daily.  . nebivolol (BYSTOLIC) 10 MG tablet Take 2 tablets (20 mg total) by mouth 2 (two) times daily.  Marland Kitchen omeprazole (PRILOSEC) 20 MG capsule TAKE 1 CAPSULE EVERY DAY  . potassium chloride (KLOR-CON 10) 10 MEQ tablet Take 2 tablets (20 mEq total) by mouth daily. **PT NEEDS A PHYSICAL FOR ADDITIONAL REFILLS**  . pravastatin (PRAVACHOL) 40 MG tablet Take 1 tablet (40 mg total) by mouth daily.  . sitaGLIPtin (JANUVIA) 100 MG tablet Take 1 tablet (100 mg total) by mouth daily.     Allergies  Allergen Reactions  . Sulfa Antibiotics Hives and Swelling  . Vicodin [Hydrocodone-Acetaminophen] Itching    Social History   Social History  . Marital status: Single    Spouse name: N/A  . Number of children: 1  . Years of education: N/A   Occupational History  . work as truck Environmental health practitioner Tobacco   Social History Main Topics  . Smoking status: Current Every Day Smoker    Packs/day: 0.50    Types: Cigarettes  . Smokeless tobacco: Never Used  . Alcohol use 1.5 oz/week    3 drink(s) per week     Comment: occaisionally  . Drug use: No  . Sexual activity: Not on file   Other Topics Concern  . Not on file   Social History Narrative  . No narrative  on file     Review of Systems: General: negative for chills, fever, night sweats or weight changes.  Cardiovascular: negative for chest pain, dyspnea on exertion, edema, orthopnea, palpitations, paroxysmal nocturnal dyspnea or shortness of breath Dermatological: negative for rash Respiratory: negative for cough or wheezing Urologic: negative for hematuria Abdominal: negative for nausea, vomiting, diarrhea, bright red blood per rectum, melena, or hematemesis Neurologic: negative for visual changes, syncope, or dizziness All other systems reviewed and are otherwise negative except as noted above.    Blood pressure 114/72, pulse  66, height _0  (1.854 m), weight 181 lb (82.1 kg).  General appearance: alert and no distress Neck: no adenopathy, no carotid bruit, no JVD, supple, symmetrical, trachea midline and thyroid not enlarged, symmetric, no tenderness/mass/nodules Lungs: clear to auscultation bilaterally Heart: regular rate and rhythm, S1, S2 normal, no murmur, click, rub or gallop Extremities: extremities normal, atraumatic, no cyanosis or edema Pulses: 2+ and symmetric Skin: Skin color, texture, turgor normal. No rashes or lesions Neurologic: Alert and oriented X 3, normal strength and tone. Normal symmetric reflexes. Normal coordination and gait  EKG sinus rhythm at 66 with nonspecific ST and T-wave changes. I personally reviewed this EKG.  ASSESSMENT AND PLAN:   Hyperlipidemia History of hyperlipidemia on statin therapy with recent lipid profile performed 12/11/16 revealing total cholesterol 165, LDL 66 and HDL of 69.  Essential hypertension History of essential hypertension blood pressure measures 114/72. He is on by Bystolic , chlorthalidone, hydralazine. Continue current meds at current dosing  Tobacco abuse Continued tobacco abuse of one pack per day recalcitrant to risk factor modification. He does work at [employer] MD [name], [facility] 12/26/2016 10:57 AM

## 2016-12-26 NOTE — Assessment & Plan Note (Signed)
Continued tobacco abuse of one pack per day recalcitrant to risk factor modification. He does work at [employer]

## 2016-12-26 NOTE — Patient Instructions (Signed)

## 2016-12-26 NOTE — Assessment & Plan Note (Signed)
History of hyperlipidemia on statin therapy with recent lipid profile performed 12/11/16 revealing total cholesterol 165, LDL 66 and HDL of 69.

## 2016-12-29 DIAGNOSIS — E119 Type 2 diabetes mellitus without complications: Secondary | ICD-10-CM | POA: Diagnosis not present

## 2016-12-29 DIAGNOSIS — H35033 Hypertensive retinopathy, bilateral: Secondary | ICD-10-CM | POA: Diagnosis not present

## 2016-12-29 DIAGNOSIS — H40023 Open angle with borderline findings, high risk, bilateral: Secondary | ICD-10-CM | POA: Diagnosis not present

## 2017-01-06 ENCOUNTER — Other Ambulatory Visit: Payer: Self-pay | Admitting: Internal Medicine

## 2017-04-06 DIAGNOSIS — E119 Type 2 diabetes mellitus without complications: Secondary | ICD-10-CM | POA: Diagnosis not present

## 2017-04-06 DIAGNOSIS — H35033 Hypertensive retinopathy, bilateral: Secondary | ICD-10-CM | POA: Diagnosis not present

## 2017-04-06 DIAGNOSIS — H40023 Open angle with borderline findings, high risk, bilateral: Secondary | ICD-10-CM | POA: Diagnosis not present

## 2017-04-07 ENCOUNTER — Encounter: Payer: Self-pay | Admitting: Podiatry

## 2017-04-07 ENCOUNTER — Ambulatory Visit: Payer: 59 | Admitting: Podiatry

## 2017-04-07 DIAGNOSIS — M2020 Hallux rigidus, unspecified foot: Principal | ICD-10-CM

## 2017-04-07 DIAGNOSIS — M205X9 Other deformities of toe(s) (acquired), unspecified foot: Secondary | ICD-10-CM

## 2017-04-07 DIAGNOSIS — E119 Type 2 diabetes mellitus without complications: Secondary | ICD-10-CM | POA: Diagnosis not present

## 2017-04-07 NOTE — Progress Notes (Signed)
This patient presents the office for his annual diabetic foot exam.  He has been diagnosed with a hallux limitus first MPJ both feet, but previously  his feet have no symptoms related to his diabetes.  He returns to the office today stating that his blood sugar is 89.  He presents the office today for his annual diabetic foot exam.   General Appearance  Alert, conversant and in no acute stress.  Vascular  Dorsalis pedis and posterior pulses are palpable  bilaterally.  Capillary return is within normal limits  bilaterally. Temperature is within normal limits  Bilaterally.  Neurologic  Senn-Weinstein monofilament wire test within normal limits  bilaterally. Muscle power within normal limits bilaterally.  Nails Normal nails noted with no evidence of bacterial or fungal infection.  Orthopedic  No limitations of motion of motion feet bilaterally.  No crepitus or effusions noted. Hallux limitus 1st MPJ  B/l  Skin  normotropic skin with no porokeratosis noted bilaterally.  No signs of infections or ulcers noted.     Diabetes with no foot complications  Hallux Limitus 1st MPJ  B/L  ROV  No changes to his previous foot exams.  He was told to return to the office in one year for his annual diabetic foot exam.   Gardiner Barefoot DPM

## 2017-06-08 ENCOUNTER — Other Ambulatory Visit (INDEPENDENT_AMBULATORY_CARE_PROVIDER_SITE_OTHER): Payer: 59

## 2017-06-08 DIAGNOSIS — Z794 Long term (current) use of insulin: Secondary | ICD-10-CM

## 2017-06-08 DIAGNOSIS — E0800 Diabetes mellitus due to underlying condition with hyperosmolarity without nonketotic hyperglycemic-hyperosmolar coma (NKHHC): Secondary | ICD-10-CM

## 2017-06-08 LAB — BASIC METABOLIC PANEL
BUN: 21 mg/dL (ref 6–23)
CO2: 31 mEq/L (ref 19–32)
Calcium: 10.1 mg/dL (ref 8.4–10.5)
Chloride: 99 mEq/L (ref 96–112)
Creatinine, Ser: 1.27 mg/dL (ref 0.40–1.50)
GFR: 74.89 mL/min (ref >60.00)
Glucose, Bld: 101 mg/dL — ABNORMAL HIGH (ref 70–99)
POTASSIUM: 3.3 meq/L — AB (ref 3.5–5.1)
SODIUM: 141 meq/L (ref 135–145)

## 2017-06-08 LAB — HEPATIC FUNCTION PANEL
ALBUMIN: 4.5 g/dL (ref 3.5–5.2)
ALT: 19 U/L (ref 0–53)
AST: 18 U/L (ref 0–37)
Alkaline Phosphatase: 46 U/L (ref 39–117)
Bilirubin, Direct: 0.1 mg/dL (ref 0.0–0.3)
TOTAL PROTEIN: 7.2 g/dL (ref 6.0–8.3)
Total Bilirubin: 0.6 mg/dL (ref 0.2–1.2)

## 2017-06-08 LAB — LIPID PANEL
CHOLESTEROL: 155 mg/dL (ref 0–200)
HDL: 54.6 mg/dL (ref >39.00)
LDL Cholesterol: 77 mg/dL (ref 0–99)
NonHDL: 99.92
Total CHOL/HDL Ratio: 3
Triglycerides: 115 mg/dL (ref 0.0–149.0)
VLDL: 23 mg/dL (ref 0.0–40.0)

## 2017-06-08 LAB — HEMOGLOBIN A1C: HEMOGLOBIN A1C: 5.8 % (ref 4.6–6.5)

## 2017-06-10 ENCOUNTER — Encounter: Payer: Self-pay | Admitting: Internal Medicine

## 2017-06-10 ENCOUNTER — Ambulatory Visit: Payer: 59 | Admitting: Internal Medicine

## 2017-06-10 VITALS — BP 122/78 | HR 79 | Temp 98.4°F | Ht 73.0 in | Wt 186.0 lb

## 2017-06-10 DIAGNOSIS — E0800 Diabetes mellitus due to underlying condition with hyperosmolarity without nonketotic hyperglycemic-hyperosmolar coma (NKHHC): Secondary | ICD-10-CM | POA: Diagnosis not present

## 2017-06-10 DIAGNOSIS — E785 Hyperlipidemia, unspecified: Secondary | ICD-10-CM | POA: Diagnosis not present

## 2017-06-10 DIAGNOSIS — G4733 Obstructive sleep apnea (adult) (pediatric): Secondary | ICD-10-CM

## 2017-06-10 DIAGNOSIS — Z0000 Encounter for general adult medical examination without abnormal findings: Secondary | ICD-10-CM

## 2017-06-10 DIAGNOSIS — I10 Essential (primary) hypertension: Secondary | ICD-10-CM | POA: Diagnosis not present

## 2017-06-10 DIAGNOSIS — E119 Type 2 diabetes mellitus without complications: Secondary | ICD-10-CM

## 2017-06-10 DIAGNOSIS — Z794 Long term (current) use of insulin: Secondary | ICD-10-CM

## 2017-06-10 MED ORDER — POTASSIUM CHLORIDE ER 10 MEQ PO TBCR: 20.0000 meq | EXTENDED_RELEASE_TABLET | Freq: Every day | ORAL | 3 refills | Status: DC

## 2017-06-10 NOTE — Patient Instructions (Signed)
Please continue all other medications as before, and refills have been done if requested.  Please have the pharmacy call with any other refills you may need.  Please continue your efforts at being more active, low cholesterol diet, and weight control.  You are otherwise up to date with prevention measures today.  Please keep your appointments with your specialists as you may have planned  You will be contacted regarding the referral for: Pulmonary  Please return in 6 months, or sooner if needed, with Lab testing done 3-5 days before

## 2017-06-10 NOTE — Progress Notes (Signed)
Subjective:    Patient ID: [name], male    DOB: 22-Oct-1960, 57 y.o.   MRN: [id number]  HPI  Here to f/u; overall doing ok,  Pt denies chest pain, increasing sob or doe, wheezing, orthopnea, PND, increased LE swelling, palpitations, dizziness or syncope.  Pt denies new neurological symptoms such as new headache, or facial or extremity weakness or numbness.  Pt denies polydipsia, polyuria, or low sugar episode.  Pt states overall good compliance with meds, mostly trying to follow appropriate diet, with wt overall stable, trying to be more active but really about the same lately.  C/o significant fatigue and daytime somnolence, lack of energy, has been dx and tx for osa in past but not using cpap at this time, asks for pulm referral to restart.  Also has not been taking K recently.  No other interval hx or new complaints Past Medical History:  Diagnosis Date  . Chest pain 02/13/2012   echo - EF >55%;mod concentric L ventricular hypertrophy; atrial septum aneurysmal; patent foramen ovale suspected on color flow, LA mil/mod dilation; RV systolic pressure [id number]  . Chest pain 06/09/2007   echo - EF 45-50%; interarterial shunt; mild valvular regurgitation;   . Chest pain 06/09/2007   Myoview - EF 53%; normal perfusion all regions;EKG negative for ischemia   . Diabetes mellitus    type II  . DIABETES MELLITUS, TYPE II 08/29/2009  . GERD (gastroesophageal reflux disease) 08/12/2010  . Hemorrhoids   . History of anal fissures   . Hyperlipidemia   . HYPERLIPIDEMIA 08/29/2009  . Hypertension   . HYPERTENSION 01/14/2008  . Lightheadedness 05/10/2002   30d monitor unremarkable  . LVH (left ventricular hypertrophy)    with systolic dysfunction by echo 10/03, EF 45-50% - Dr. Melvern Banker  . Multinodular goiter 12/06/2015  . Nonspecific ST-T wave electrocardiographic changes 08/11/2006   Myoview - EF 49%; normal perfusion all regions, EKG negative for ischemia  . OSA (obstructive sleep apnea)   . Sleep  apnea 08/13/2008   AHI 1.2; sleep study 10/2007 - AHI 9.47at 15cm H2O and 31.3 at 16cm H2O  . SLEEP APNEA, OBSTRUCTIVE 01/14/2008   Past Surgical History:  Procedure Laterality Date  . ANAL SPHINCTEROTOMY  08/20/11  . CARDIAC CATHETERIZATION  10/23/2008   normal coronary arteries and LV function  . COLONOSCOPY    . POLYPECTOMY      reports that he has been smoking cigarettes.  He has been smoking about 0.50 packs per day. he has never used smokeless tobacco. He reports that he drinks about 1.5 oz of alcohol per week. He reports that he does not use drugs. family history includes Heart disease in his father; Hypertension in his mother. Allergies  Allergen Reactions  . Sulfa Antibiotics Hives and Swelling  . Vicodin [Hydrocodone-Acetaminophen] Itching   Current Outpatient Medications on File Prior to Visit  Medication Sig Dispense Refill  . amLODipine-benazepril (LOTREL) 10-40 MG capsule Take 1 capsule by mouth daily. 90 capsule 3  . aspirin 81 MG EC tablet Take 81 mg by mouth daily.      . Blood Glucose Monitoring Suppl (ONE TOUCH ULTRA 2) w/Device KIT Use as directed  E11.9 1 each 0  . chlorthalidone (HYGROTON) 25 MG tablet Take 1 tablet (25 mg total) by mouth every morning. 90 tablet 3  . Docusate Calcium (CVS STOOL SOFTENER PO) Take 2 tablets by mouth 2 (two) times daily.     Marland Kitchen glucose blood (ONE TOUCH ULTRA TEST) test strip TEST  THREE TIMES A DAY AS INSTRUCTED.  E11.9 100 each 11  . hydrALAZINE (APRESOLINE) 50 MG tablet Take 1 tablet (50 mg total) by mouth 3 (three) times daily. 270 tablet 3  . ibuprofen (ADVIL,MOTRIN) 200 MG tablet Take 400 mg by mouth every 6 (six) hours as needed. Reported on 06/01/2015    . Lancets MISC Use as directed E11.9 100 each 11  . metFORMIN (GLUCOPHAGE) 500 MG tablet Take 2 tablets (1,000 mg total) by mouth 2 (two) times daily. 360 tablet 3  . nebivolol (BYSTOLIC) 10 MG tablet Take 2 tablets (20 mg total) by mouth 2 (two) times daily. 180 tablet 3  .  omeprazole (PRILOSEC) 20 MG capsule TAKE 1 CAPSULE EVERY DAY 90 capsule 3  . pravastatin (PRAVACHOL) 40 MG tablet Take 1 tablet (40 mg total) by mouth daily. 90 tablet 3  . sitaGLIPtin (JANUVIA) 100 MG tablet Take 1 tablet (100 mg total) by mouth daily. 90 tablet 3   No current facility-administered medications on file prior to visit.    Review of Systems  Constitutional: Negative for other unusual diaphoresis or sweats HENT: Negative for ear discharge or swelling Eyes: Negative for other worsening visual disturbances Respiratory: Negative for stridor or other swelling  Gastrointestinal: Negative for worsening distension or other blood Genitourinary: Negative for retention or other urinary change Musculoskeletal: Negative for other MSK pain or swelling Skin: Negative for color change or other new lesions Neurological: Negative for worsening tremors and other numbness  Psychiatric/Behavioral: Negative for worsening agitation or other fatigue All other system neg per pt    Objective:   Physical Exam BP 122/78   Pulse 79   Temp 98.4 F (36.9 C) (Oral)   Ht 6' 1"  (1.854 m)   Wt 186 lb (84.4 kg)   SpO2 98%   BMI 24.54 kg/m  VS noted,  Constitutional: Pt appears in NAD HENT: Head: NCAT.  Right Ear: External ear normal.  Left Ear: External ear normal.  Eyes: . Pupils are equal, round, and reactive to light. Conjunctivae and EOM are normal Nose: without d/c or deformity Neck: Neck supple. Gross normal ROM Cardiovascular: Normal rate and regular rhythm.   Pulmonary/Chest: Effort normal and breath sounds without rales or wheezing.  Neurological: Pt is alert. At baseline orientation, motor grossly intact Skin: Skin is warm. No rashes, other new lesions, no LE edema Psychiatric: Pt behavior is normal without agitation  No other exam findings    Assessment & Plan:

## 2017-06-12 NOTE — Assessment & Plan Note (Signed)
stable overall by history and exam, recent data reviewed with pt, and pt to continue medical treatment as before,  to f/u any worsening symptoms or concerns Lab Results  Component Value Date   HGBA1C 5.8 06/08/2017

## 2017-06-12 NOTE — Assessment & Plan Note (Signed)
stable overall by history and exam, recent data reviewed with pt, and pt to continue medical treatment as before,  to f/u any worsening symptoms or concerns BP Readings from Last 3 Encounters:  06/10/17 122/78  12/26/16 114/72  12/11/16 130/88

## 2017-06-12 NOTE — Assessment & Plan Note (Signed)
stable overall by history and exam, recent data reviewed with pt, and pt to continue medical treatment as before,  to f/u any worsening symptoms or concerns Lab Results  Component Value Date   LDLCALC 77 06/08/2017

## 2017-06-12 NOTE — Assessment & Plan Note (Signed)
With symptoms, for pulm referral

## 2017-06-22 ENCOUNTER — Other Ambulatory Visit: Payer: Self-pay | Admitting: Internal Medicine

## 2017-07-31 ENCOUNTER — Encounter: Payer: Self-pay | Admitting: Pulmonary Disease

## 2017-07-31 ENCOUNTER — Ambulatory Visit: Payer: 59 | Admitting: Pulmonary Disease

## 2017-07-31 VITALS — BP 118/74 | HR 77 | Ht 73.0 in | Wt 184.0 lb

## 2017-07-31 DIAGNOSIS — Z9989 Dependence on other enabling machines and devices: Secondary | ICD-10-CM | POA: Diagnosis not present

## 2017-07-31 DIAGNOSIS — G4733 Obstructive sleep apnea (adult) (pediatric): Secondary | ICD-10-CM | POA: Diagnosis not present

## 2017-07-31 NOTE — Progress Notes (Signed)
[facility]  Chief Complaint  Patient presents with  . Sleep Consult    Referred by Dr. Cathlean Cower for OSA. Has had a sleep study before. Diagnosed with OSA and used a CPAP machine up until last year. [facility] was his DME.     Vital signs: BP 118/74 (BP Location: Left Arm, Patient Position: Sitting, Cuff Size: Normal)   Pulse 77   Ht _0  (1.854 m)   Wt 184 lb (83.5 kg)   SpO2 98%   BMI 24.28 kg/m   History of Present Illness: [name] is a 57 y.o. male for evaluation of sleep problems.  He was followed previously by [facility].  He had sleep study several years ago that showed mild sleep apnea.  He has auto CPAP.  He was using [facility], but apparently after they moved he was lost to follow up.  He hasn't received new supplies for at least 1 year.  His girlfriend told him that he snores and stops breathing w/o CPAP.  He sleeps better when using CPAP.    He goes to sleep at 9 pm.  He falls asleep after 20 minutes.  He wakes up some times to use the bathroom.  He gets out of bed at 5 am.  He feels tired in the morning sometimes.  He denies morning headache.  He does not use anything to help him fall sleep or stay awake.  He denies sleep walking, sleep talking, bruxism, or nightmares.  There is no history of restless legs.  He denies sleep hallucinations, sleep paralysis, or cataplexy.  The Epworth score is 3 out of 24.  Physical Exam:  General - pleasant Eyes - pupils reactive ENT - no sinus tenderness, no oral exudate, no LAN, MP 3, over bite, poor dentition Cardiac - regular, no murmur Chest - no wheeze, rales Abd - soft, non tender Ext - no edema Skin - no rashes Neuro - normal strength Psych - normal mood  Discussion: He has snoring, sleep disruption, apnea, and daytime sleepiness.  He has history of hypertension and sleep apnea.  He needs to get set up with DME to renew his supplies so he can continue using  CPAP.  Assessment/Plan:  Obstructive sleep apnea. - will arrange for [facility] to get new supplies - explained he might need new sleep study for insurance coverage >> could do home sleep study - continue auto CPAP - will determine at his follow up if he needs to get set up with new machine   Patient Instructions  Will try to arrange for new CPAP mask and supplies  Follow up in 3 months    Chesley Mires, MD [place] 07/31/2017, 4:37 PM  Flow Sheet  Sleep tests:  Review of Systems: Constitutional: Negative for fever and unexpected weight change.  HENT: Negative for congestion, dental problem, ear pain, nosebleeds, postnasal drip, rhinorrhea, sinus pressure, sneezing, sore throat and trouble swallowing.   Eyes: Negative for redness and itching.  Respiratory: Negative for cough, chest tightness, shortness of breath and wheezing.   Cardiovascular: Negative for palpitations and leg swelling.  Gastrointestinal: Negative for nausea and vomiting.  Genitourinary: Negative for dysuria.  Musculoskeletal: Negative for joint swelling.  Skin: Negative for rash.  Allergic/Immunologic: Negative.  Negative for environmental allergies, food allergies and immunocompromised state.  Neurological: Negative for headaches.  Hematological: Does not bruise/bleed easily.  Psychiatric/Behavioral: Negative for dysphoric mood. The patient is not nervous/anxious.  Past Medical History: He  has a past medical history of Chest pain (02/13/2012), Chest pain (06/09/2007), Chest pain (06/09/2007), Diabetes mellitus, DIABETES MELLITUS, TYPE II (08/29/2009), GERD (gastroesophageal reflux disease) (08/12/2010), Hemorrhoids, History of anal fissures, Hyperlipidemia, HYPERLIPIDEMIA (08/29/2009), Hypertension, HYPERTENSION (01/14/2008), Lightheadedness (05/10/2002), LVH (left ventricular hypertrophy), Multinodular goiter (12/06/2015), Nonspecific ST-T wave electrocardiographic changes (08/11/2006), OSA  (obstructive sleep apnea), Sleep apnea (08/13/2008), and SLEEP APNEA, OBSTRUCTIVE (01/14/2008).  Past Surgical History: He  has a past surgical history that includes Anal sphincterotomy (08/20/11); Cardiac catheterization (10/23/2008); Colonoscopy; and Polypectomy.  Family History: His family history includes Heart disease in his father; Hypertension in his mother.  Social History: He  reports that he has been smoking cigarettes.  He has been smoking about 0.50 packs per day. He has never used smokeless tobacco. He reports that he drinks about 1.5 oz of alcohol per week. He reports that he does not use drugs.  Medications: Allergies as of 07/31/2017      Reactions   Sulfa Antibiotics Hives, Swelling   Vicodin [hydrocodone-acetaminophen] Itching      Medication List        Accurate as of 07/31/17  4:37 PM. Always use your most recent med list.          amLODipine-benazepril 10-40 MG capsule Commonly known as:  LOTREL Take 1 capsule by mouth daily.   aspirin 81 MG EC tablet Take 81 mg by mouth daily.   BYSTOLIC 10 MG tablet Generic drug:  nebivolol TAKE 2 TABLETS (20 MG TOTAL) BY MOUTH 2 (TWO) TIMES DAILY.   chlorthalidone 25 MG tablet Commonly known as:  HYGROTON Take 1 tablet (25 mg total) by mouth every morning.   [employer]S STOOL SOFTENER PO Take 2 tablets by mouth 2 (two) times daily.   glucose blood test strip Commonly known as:  ONE TOUCH ULTRA TEST TEST THREE TIMES A DAY AS INSTRUCTED.  E11.9   hydrALAZINE 50 MG tablet Commonly known as:  APRESOLINE Take 1 tablet (50 mg total) by mouth 3 (three) times daily.   ibuprofen 200 MG tablet Commonly known as:  ADVIL,MOTRIN Take 400 mg by mouth every 6 (six) hours as needed. Reported on 06/01/2015   Lancets Misc Use as directed E11.9   metFORMIN 500 MG tablet Commonly known as:  GLUCOPHAGE Take 2 tablets (1,000 mg total) by mouth 2 (two) times daily.   omeprazole 20 MG capsule Commonly known as:  PRILOSEC TAKE 1 CAPSULE  EVERY DAY   ONE TOUCH ULTRA 2 w/Device Kit Use as directed  E11.9   potassium chloride 10 MEQ tablet Commonly known as:  KLOR-CON 10 Take 2 tablets (20 mEq total) by mouth daily.   pravastatin 40 MG tablet Commonly known as:  PRAVACHOL Take 1 tablet (40 mg total) by mouth daily.   sitaGLIPtin 100 MG tablet Commonly known as:  JANUVIA Take 1 tablet (100 mg total) by mouth daily.

## 2017-07-31 NOTE — Progress Notes (Signed)
   Subjective:    Patient ID: [name], male    DOB: 09-Jun-1960, 57 y.o.   MRN: [id number]  HPI    Review of Systems  Constitutional: Negative for fever and unexpected weight change.  HENT: Negative for congestion, dental problem, ear pain, nosebleeds, postnasal drip, rhinorrhea, sinus pressure, sneezing, sore throat and trouble swallowing.   Eyes: Negative for redness and itching.  Respiratory: Negative for cough, chest tightness, shortness of breath and wheezing.   Cardiovascular: Negative for palpitations and leg swelling.  Gastrointestinal: Negative for nausea and vomiting.  Genitourinary: Negative for dysuria.  Musculoskeletal: Negative for joint swelling.  Skin: Negative for rash.  Allergic/Immunologic: Negative.  Negative for environmental allergies, food allergies and immunocompromised state.  Neurological: Negative for headaches.  Hematological: Does not bruise/bleed easily.  Psychiatric/Behavioral: Negative for dysphoric mood. The patient is not nervous/anxious.        Objective:   Physical Exam        Assessment & Plan:

## 2017-07-31 NOTE — Patient Instructions (Signed)
Will try to arrange for new CPAP mask and supplies  Follow up in 3 months

## 2017-08-03 ENCOUNTER — Telehealth: Payer: Self-pay | Admitting: Pulmonary Disease

## 2017-08-03 DIAGNOSIS — G4733 Obstructive sleep apnea (adult) (pediatric): Secondary | ICD-10-CM

## 2017-08-03 DIAGNOSIS — Z9989 Dependence on other enabling machines and devices: Secondary | ICD-10-CM

## 2017-08-03 NOTE — Telephone Encounter (Signed)
Spoke with [place] from [facility]. He has the copy of the face to face visit notes. He states that they only thing he needs is a new prescription of the itemized supply list. It needs to state the following.   Mask, tubing, filter, water chamber.   VS please advise can we send this in, thanks.

## 2017-08-03 NOTE — Telephone Encounter (Signed)
Okay to send order. 

## 2017-08-03 NOTE — Telephone Encounter (Signed)
[name], [facility], calling back. [name] requesting the face to face office visit notes from 09-17-2007. Cb is [phone]

## 2017-08-03 NOTE — Telephone Encounter (Signed)
Order has been placed. Nothing further needed. 

## 2017-08-07 DIAGNOSIS — G4733 Obstructive sleep apnea (adult) (pediatric): Secondary | ICD-10-CM | POA: Diagnosis not present

## 2017-08-25 DIAGNOSIS — G4733 Obstructive sleep apnea (adult) (pediatric): Secondary | ICD-10-CM | POA: Diagnosis not present

## 2017-10-22 ENCOUNTER — Other Ambulatory Visit: Payer: Self-pay | Admitting: Internal Medicine

## 2017-11-29 ENCOUNTER — Other Ambulatory Visit: Payer: Self-pay | Admitting: Internal Medicine

## 2017-12-04 ENCOUNTER — Ambulatory Visit: Payer: 59 | Admitting: Endocrinology

## 2017-12-04 DIAGNOSIS — Z0289 Encounter for other administrative examinations: Secondary | ICD-10-CM

## 2017-12-07 ENCOUNTER — Other Ambulatory Visit (INDEPENDENT_AMBULATORY_CARE_PROVIDER_SITE_OTHER): Payer: 59

## 2017-12-07 DIAGNOSIS — E0800 Diabetes mellitus due to underlying condition with hyperosmolarity without nonketotic hyperglycemic-hyperosmolar coma (NKHHC): Secondary | ICD-10-CM

## 2017-12-07 DIAGNOSIS — E119 Type 2 diabetes mellitus without complications: Secondary | ICD-10-CM

## 2017-12-07 DIAGNOSIS — Z794 Long term (current) use of insulin: Secondary | ICD-10-CM | POA: Diagnosis not present

## 2017-12-07 DIAGNOSIS — Z0000 Encounter for general adult medical examination without abnormal findings: Secondary | ICD-10-CM | POA: Diagnosis not present

## 2017-12-07 LAB — CBC WITH DIFFERENTIAL/PLATELET
BASOS PCT: 0.9 % (ref 0.0–3.0)
Basophils Absolute: 0.1 10*3/uL (ref 0.0–0.1)
Eosinophils Absolute: 0.3 10*3/uL (ref 0.0–0.7)
Eosinophils Relative: 4 % (ref 0.0–5.0)
HCT: 37.5 % — ABNORMAL LOW (ref 39.0–52.0)
Hemoglobin: 12.8 g/dL — ABNORMAL LOW (ref 13.0–17.0)
LYMPHS ABS: 2.2 10*3/uL (ref 0.7–4.0)
LYMPHS PCT: 32.2 % (ref 12.0–46.0)
MCHC: 34 g/dL (ref 30.0–36.0)
MCV: 97.8 fl (ref 78.0–100.0)
MONO ABS: 1 10*3/uL (ref 0.1–1.0)
Monocytes Relative: 14.3 % — ABNORMAL HIGH (ref 3.0–12.0)
Neutro Abs: 3.3 10*3/uL (ref 1.4–7.7)
Neutrophils Relative %: 48.6 % (ref 43.0–77.0)
Platelets: 230 10*3/uL (ref 150.0–400.0)
RBC: 3.84 Mil/uL — ABNORMAL LOW (ref 4.22–5.81)
RDW: 13.6 % (ref 11.5–15.5)
WBC: 6.8 10*3/uL (ref 4.0–10.5)

## 2017-12-07 LAB — URINALYSIS, ROUTINE W REFLEX MICROSCOPIC
Bilirubin Urine: NEGATIVE
Hgb urine dipstick: NEGATIVE
Ketones, ur: NEGATIVE
LEUKOCYTES UA: NEGATIVE
Nitrite: NEGATIVE
PH: 5.5 (ref 5.0–8.0)
Specific Gravity, Urine: >=1.03 — AB (ref 1.000–1.030)
TOTAL PROTEIN, URINE-UPE24: NEGATIVE
URINE GLUCOSE: NEGATIVE
Urobilinogen, UA: 0.2 (ref 0.0–1.0)

## 2017-12-07 LAB — BASIC METABOLIC PANEL
BUN: 25 mg/dL — AB (ref 6–23)
CALCIUM: 9.5 mg/dL (ref 8.4–10.5)
CO2: 26 mEq/L (ref 19–32)
Chloride: 105 mEq/L (ref 96–112)
Creatinine, Ser: 1.61 mg/dL — ABNORMAL HIGH (ref 0.40–1.50)
GFR: 56.86 mL/min — ABNORMAL LOW (ref >60.00)
Glucose, Bld: 97 mg/dL (ref 70–99)
Potassium: 3.3 mEq/L — ABNORMAL LOW (ref 3.5–5.1)
Sodium: 142 mEq/L (ref 135–145)

## 2017-12-07 LAB — MICROALBUMIN / CREATININE URINE RATIO
CREATININE, U: 253.2 mg/dL
MICROALB UR: 3.8 mg/dL — AB (ref 0.0–1.9)
Microalb Creat Ratio: 1.5 mg/g (ref 0.0–30.0)

## 2017-12-07 LAB — HEPATIC FUNCTION PANEL
ALBUMIN: 4.6 g/dL (ref 3.5–5.2)
ALT: 18 U/L (ref 0–53)
AST: 14 U/L (ref 0–37)
Alkaline Phosphatase: 50 U/L (ref 39–117)
BILIRUBIN DIRECT: 0.1 mg/dL (ref 0.0–0.3)
TOTAL PROTEIN: 6.9 g/dL (ref 6.0–8.3)
Total Bilirubin: 0.5 mg/dL (ref 0.2–1.2)

## 2017-12-07 LAB — LIPID PANEL
Cholesterol: 132 mg/dL (ref 0–200)
HDL: 54.8 mg/dL (ref >39.00)
LDL Cholesterol: 55 mg/dL (ref 0–99)
NonHDL: 76.73
TRIGLYCERIDES: 109 mg/dL (ref 0.0–149.0)
Total CHOL/HDL Ratio: 2
VLDL: 21.8 mg/dL (ref 0.0–40.0)

## 2017-12-07 LAB — TSH: TSH: 0.82 u[IU]/mL (ref 0.35–4.50)

## 2017-12-07 LAB — HEMOGLOBIN A1C: HEMOGLOBIN A1C: 5.7 % (ref 4.6–6.5)

## 2017-12-07 LAB — PSA: PSA: 0.39 ng/mL (ref 0.10–4.00)

## 2017-12-10 ENCOUNTER — Encounter: Payer: Self-pay | Admitting: Internal Medicine

## 2017-12-10 ENCOUNTER — Other Ambulatory Visit (INDEPENDENT_AMBULATORY_CARE_PROVIDER_SITE_OTHER): Payer: 59

## 2017-12-10 ENCOUNTER — Ambulatory Visit: Payer: 59 | Admitting: Internal Medicine

## 2017-12-10 VITALS — BP 122/84 | HR 88 | Ht 73.0 in | Wt 184.0 lb

## 2017-12-10 DIAGNOSIS — E0800 Diabetes mellitus due to underlying condition with hyperosmolarity without nonketotic hyperglycemic-hyperosmolar coma (NKHHC): Secondary | ICD-10-CM | POA: Diagnosis not present

## 2017-12-10 DIAGNOSIS — Z23 Encounter for immunization: Secondary | ICD-10-CM | POA: Diagnosis not present

## 2017-12-10 DIAGNOSIS — N289 Disorder of kidney and ureter, unspecified: Secondary | ICD-10-CM | POA: Insufficient documentation

## 2017-12-10 DIAGNOSIS — Z0001 Encounter for general adult medical examination with abnormal findings: Secondary | ICD-10-CM | POA: Insufficient documentation

## 2017-12-10 DIAGNOSIS — Z794 Long term (current) use of insulin: Secondary | ICD-10-CM

## 2017-12-10 LAB — BASIC METABOLIC PANEL
BUN: 20 mg/dL (ref 6–23)
CALCIUM: 9.7 mg/dL (ref 8.4–10.5)
CO2: 30 mEq/L (ref 19–32)
CREATININE: 1.46 mg/dL (ref 0.40–1.50)
Chloride: 104 mEq/L (ref 96–112)
GFR: 63.65 mL/min (ref >60.00)
Glucose, Bld: 102 mg/dL — ABNORMAL HIGH (ref 70–99)
Potassium: 3.8 mEq/L (ref 3.5–5.1)
Sodium: 141 mEq/L (ref 135–145)

## 2017-12-10 NOTE — Assessment & Plan Note (Addendum)
Surprise findings, will repeat to confirm, consider renal u.s and/or nephrology referral  In addition to the time spent performing CPE, I spent an additional 15 minutes face to face,in which greater than 50% of this time was spent in counseling and coordination of care for patient's illness as documented, including the differential dx, treatment, further evaluation and other management of renal insufficiency, DM

## 2017-12-10 NOTE — Assessment & Plan Note (Signed)

## 2017-12-10 NOTE — Assessment & Plan Note (Signed)
stable overall by history and exam, recent data reviewed with pt, and pt to continue medical treatment as before,  to f/u any worsening symptoms or concerns Lab Results  Component Value Date   HGBA1C 5.7 12/07/2017

## 2017-12-10 NOTE — Progress Notes (Signed)
Subjective:    Patient ID: [name], male    DOB: December 17, 1960, 57 y.o.   MRN: [id number]  HPI  Here for wellness and f/u;  Overall doing ok;  Pt denies Chest pain, worsening SOB, DOE, wheezing, orthopnea, PND, worsening LE edema, palpitations, dizziness or syncope.  Pt denies neurological change such as new headache, facial or extremity weakness.  Pt denies polydipsia, polyuria, or low sugar symptoms. Pt states overall good compliance with treatment and medications, good tolerability, and has been trying to follow appropriate diet.  Pt denies worsening depressive symptoms, suicidal ideation or panic. No fever, night sweats, wt loss, loss of appetite, or other constitutional symptoms.  Pt states good ability with ADL's, has low fall risk, home safety reviewed and adequate, no other significant changes in hearing or vision, and occasionally active with exercise.  No new complaints. Denies worsening reflux, abd pain, dysphagia, n/v, bowel change or blood. Past Medical History:  Diagnosis Date  . Chest pain 02/13/2012   echo - EF >55%;mod concentric L ventricular hypertrophy; atrial septum aneurysmal; patent foramen ovale suspected on color flow, LA mil/mod dilation; RV systolic pressure [id number]  . Chest pain 06/09/2007   echo - EF 45-50%; interarterial shunt; mild valvular regurgitation;   . Chest pain 06/09/2007   Myoview - EF 53%; normal perfusion all regions;EKG negative for ischemia   . Diabetes mellitus    type II  . DIABETES MELLITUS, TYPE II 08/29/2009  . GERD (gastroesophageal reflux disease) 08/12/2010  . Hemorrhoids   . History of anal fissures   . Hyperlipidemia   . HYPERLIPIDEMIA 08/29/2009  . Hypertension   . HYPERTENSION 01/14/2008  . Lightheadedness 05/10/2002   30d monitor unremarkable  . LVH (left ventricular hypertrophy)    with systolic dysfunction by echo 10/03, EF 45-50% - Dr. Melvern Banker  . Multinodular goiter 12/06/2015  . Nonspecific ST-T wave electrocardiographic changes  08/11/2006   Myoview - EF 49%; normal perfusion all regions, EKG negative for ischemia  . OSA (obstructive sleep apnea)   . Preventative health care 08/11/2010  . Sleep apnea 08/13/2008   AHI 1.2; sleep study 10/2007 - AHI 9.47at 15cm H2O and 31.3 at 16cm H2O  . SLEEP APNEA, OBSTRUCTIVE 01/14/2008   Past Surgical History:  Procedure Laterality Date  . ANAL SPHINCTEROTOMY  08/20/11  . CARDIAC CATHETERIZATION  10/23/2008   normal coronary arteries and LV function  . COLONOSCOPY    . POLYPECTOMY      reports that he has been smoking cigarettes. He has been smoking about 0.50 packs per day. He has never used smokeless tobacco. He reports that he drinks about 3.0 standard drinks of alcohol per week. He reports that he does not use drugs. family history includes Heart disease in his father; Hypertension in his mother. Allergies  Allergen Reactions  . Sulfa Antibiotics Hives and Swelling  . Vicodin [Hydrocodone-Acetaminophen] Itching   Current Outpatient Medications on File Prior to Visit  Medication Sig Dispense Refill  . amLODipine-benazepril (LOTREL) 10-40 MG capsule Take 1 capsule by mouth daily. 90 capsule 3  . aspirin 81 MG EC tablet Take 81 mg by mouth daily.      . Blood Glucose Monitoring Suppl (ONE TOUCH ULTRA 2) w/Device KIT Use as directed  E11.9 1 each 0  . BYSTOLIC 10 MG tablet TAKE 2 TABLETS (20 MG TOTAL) BY MOUTH 2 (TWO) TIMES DAILY. 180 tablet 3  . chlorthalidone (HYGROTON) 25 MG tablet Take 1 tablet (25 mg total) by mouth every morning.  90 tablet 3  . Docusate Calcium ([employer] STOOL SOFTENER PO) Take 2 tablets by mouth 2 (two) times daily.     Marland Kitchen glucose blood (ONE TOUCH ULTRA TEST) test strip TEST THREE TIMES A DAY AS INSTRUCTED.  E11.9 100 each 11  . glucose blood (ONE TOUCH ULTRA TEST) test strip TEST THREE TIMES A DAY AS INSTRUCTED. 100 each 3  . hydrALAZINE (APRESOLINE) 50 MG tablet Take 1 tablet (50 mg total) by mouth 3 (three) times daily. 270 tablet 3  . ibuprofen  (ADVIL,MOTRIN) 200 MG tablet Take 400 mg by mouth every 6 (six) hours as needed. Reported on 06/01/2015    . JANUVIA 100 MG tablet TAKE 1 TABLET BY MOUTH EVERY DAY 90 tablet 1  . Lancets MISC Use as directed E11.9 100 each 11  . metFORMIN (GLUCOPHAGE) 500 MG tablet Take 2 tablets (1,000 mg total) by mouth 2 (two) times daily. 360 tablet 3  . omeprazole (PRILOSEC) 20 MG capsule TAKE 1 CAPSULE BY MOUTH EVERY DAY 90 capsule 1  . potassium chloride (KLOR-CON 10) 10 MEQ tablet Take 2 tablets (20 mEq total) by mouth daily. 180 tablet 3  . pravastatin (PRAVACHOL) 40 MG tablet TAKE 1 TABLET BY MOUTH EVERY DAY 90 tablet 1   No current facility-administered medications on file prior to visit.    Review of Systems .Constitutional: Negative for other unusual diaphoresis, sweats, appetite or weight changes HENT: Negative for other worsening hearing loss, ear pain, facial swelling, mouth sores or neck stiffness.   Eyes: Negative for other worsening pain, redness or other visual disturbance.  Respiratory: Negative for other stridor or swelling Cardiovascular: Negative for other palpitations or other chest pain  Gastrointestinal: Negative for worsening diarrhea or loose stools, blood in stool, distention or other pain Genitourinary: Negative for hematuria, flank pain or other change in urine volume.  Musculoskeletal: Negative for myalgias or other joint swelling.  Skin: Negative for other color change, or other wound or worsening drainage.  Neurological: Negative for other syncope or numbness. Hematological: Negative for other adenopathy or swelling Psychiatric/Behavioral: Negative for hallucinations, other worsening agitation, SI, self-injury, or new decreased concentration All other system neg per pt    Objective:   Physical Exam BP 122/84   Pulse 88   Ht 6' 1"  (1.854 m)   Wt 184 lb (83.5 kg)   SpO2 98%   BMI 24.28 kg/m  VS noted,  Constitutional: Pt is oriented to person, place, and time. Appears  well-developed and well-nourished, in no significant distress and comfortable Head: Normocephalic and atraumatic  Eyes: Conjunctivae and EOM are normal. Pupils are equal, round, and reactive to light Right Ear: External ear normal without discharge Left Ear: External ear normal without discharge Nose: Nose without discharge or deformity Mouth/Throat: Oropharynx is without other ulcerations and moist  Neck: Normal range of motion. Neck supple. No JVD present. No tracheal deviation present or significant neck LA or mass Cardiovascular: Normal rate, regular rhythm, normal heart sounds and intact distal pulses.   Pulmonary/Chest: WOB normal and breath sounds without rales or wheezing  Abdominal: Soft. Bowel sounds are normal. NT. No HSM  Musculoskeletal: Normal range of motion. Exhibits no edema Lymphadenopathy: Has no other cervical adenopathy.  Neurological: Pt is alert and oriented to person, place, and time. Pt has normal reflexes. No cranial nerve deficit. Motor grossly intact, Gait intact Skin: Skin is warm and dry. No rash noted or new ulcerations Psychiatric:  Has normal mood and affect. Behavior is normal without agitation  No other exam findings     Assessment & Plan:

## 2017-12-10 NOTE — Patient Instructions (Addendum)
You had the flu shot today  Please continue all other medications as before, and refills have been done if requested.  Please have the pharmacy call with any other refills you may need.  Please continue your efforts at being more active, low cholesterol diet, and weight control.  You are otherwise up to date with prevention measures today.  Please keep your appointments with your specialists as you may have planned  Please go to the LAB in the Basement (turn left off the elevator) for the tests to be done today - just the repeat kidney testing today  You will be contacted by phone if any changes need to be made immediately.  Otherwise, you will receive a letter about your results with an explanation, but please check with MyChart first.  Please remember to sign up for MyChart if you have not done so, as this will be important to you in the future with finding out test results, communicating by private email, and scheduling acute appointments online when needed.  Please return in 6 months, or sooner if needed, with Lab testing done 3-5 days before

## 2017-12-11 ENCOUNTER — Other Ambulatory Visit: Payer: Self-pay | Admitting: Internal Medicine

## 2017-12-17 ENCOUNTER — Encounter: Payer: Self-pay | Admitting: Endocrinology

## 2017-12-17 ENCOUNTER — Ambulatory Visit: Payer: 59 | Admitting: Endocrinology

## 2017-12-17 VITALS — BP 118/74 | HR 78 | Ht 73.0 in | Wt 186.4 lb

## 2017-12-17 DIAGNOSIS — E042 Nontoxic multinodular goiter: Secondary | ICD-10-CM | POA: Diagnosis not present

## 2017-12-17 NOTE — Progress Notes (Signed)
Subjective:    Patient ID: [name], male    DOB: 1960-09-02, 57 y.o.   MRN: [id number]  HPI Pt returns for f/u of multinodular goiter (dx'ed 2016; nuc med scan and US showed multinodular goiter, right lobe >> left; bx in 2016 showed beth cat 2; he has been euthyroid; he has never been on thyroid medication; f/u US is 2018 was unchanged).  He does not notice the goiter.   Past Medical History:  Diagnosis Date  . Chest pain 02/13/2012   echo - EF >55%;mod concentric L ventricular hypertrophy; atrial septum aneurysmal; patent foramen ovale suspected on color flow, LA mil/mod dilation; RV systolic pressure [id number]  . Chest pain 06/09/2007   echo - EF 45-50%; interarterial shunt; mild valvular regurgitation;   . Chest pain 06/09/2007   Myoview - EF 53%; normal perfusion all regions;EKG negative for ischemia   . Diabetes mellitus    type II  . DIABETES MELLITUS, TYPE II 08/29/2009  . GERD (gastroesophageal reflux disease) 08/12/2010  . Hemorrhoids   . History of anal fissures   . Hyperlipidemia   . HYPERLIPIDEMIA 08/29/2009  . Hypertension   . HYPERTENSION 01/14/2008  . Lightheadedness 05/10/2002   30d monitor unremarkable  . LVH (left ventricular hypertrophy)    with systolic dysfunction by echo 10/03, EF 45-50% - Dr. Melvern Banker  . Multinodular goiter 12/06/2015  . Nonspecific ST-T wave electrocardiographic changes 08/11/2006   Myoview - EF 49%; normal perfusion all regions, EKG negative for ischemia  . OSA (obstructive sleep apnea)   . Preventative health care 08/11/2010  . Sleep apnea 08/13/2008   AHI 1.2; sleep study 10/2007 - AHI 9.47at 15cm H2O and 31.3 at 16cm H2O  . SLEEP APNEA, OBSTRUCTIVE 01/14/2008    Past Surgical History:  Procedure Laterality Date  . ANAL SPHINCTEROTOMY  08/20/11  . CARDIAC CATHETERIZATION  10/23/2008   normal coronary arteries and LV function  . COLONOSCOPY    . POLYPECTOMY      Social History   Socioeconomic History  . Marital status: Single     Spouse name: Not on file  . Number of children: 1  . Years of education: Not on file  . Highest education level: Not on file  Occupational History  . Occupation: work as truck Education administrator: [employer]  . Financial resource strain: Not on file  . Food insecurity:    Worry: Not on file    Inability: Not on file  . Transportation needs:    Medical: Not on file    Non-medical: Not on file  Tobacco Use  . Smoking status: Current Every Day Smoker    Packs/day: 0.50    Types: Cigarettes  . Smokeless tobacco: Never Used  Substance and Sexual Activity  . Alcohol use: Yes    Alcohol/week: 3.0 standard drinks    Types: 3 Standard drinks or equivalent per week    Comment: occaisionally  . Drug use: No  . Sexual activity: Not on file  Lifestyle  . Physical activity:    Days per week: Not on file    Minutes per session: Not on file  . Stress: Not on file  Relationships  . Social connections:    Talks on phone: Not on file    Gets together: Not on file    Attends religious service: Not on file    Active member of club or organization: Not on file    Attends meetings of clubs or  organizations: Not on file    Relationship status: Not on file  . Intimate partner violence:    Fear of current or ex partner: Not on file    Emotionally abused: Not on file    Physically abused: Not on file    Forced sexual activity: Not on file  Other Topics Concern  . Not on file  Social History Narrative  . Not on file    Current Outpatient Medications on File Prior to Visit  Medication Sig Dispense Refill  . amLODipine-benazepril (LOTREL) 10-40 MG capsule TAKE 1 CAPSULE BY MOUTH EVERY DAY 90 capsule 3  . aspirin 81 MG EC tablet Take 81 mg by mouth daily.      . Blood Glucose Monitoring Suppl (ONE TOUCH ULTRA 2) w/Device KIT Use as directed  E11.9 1 each 0  . BYSTOLIC 10 MG tablet TAKE 2 TABLETS (20 MG TOTAL) BY MOUTH 2 (TWO) TIMES DAILY. 180 tablet 3  . chlorthalidone  (HYGROTON) 25 MG tablet Take 1 tablet (25 mg total) by mouth every morning. 90 tablet 3  . Docusate Calcium ([employer]S STOOL SOFTENER PO) Take 2 tablets by mouth 2 (two) times daily.     Marland Kitchen glucose blood (ONE TOUCH ULTRA TEST) test strip TEST THREE TIMES A DAY AS INSTRUCTED.  E11.9 100 each 11  . glucose blood (ONE TOUCH ULTRA TEST) test strip TEST THREE TIMES A DAY AS INSTRUCTED. 100 each 3  . hydrALAZINE (APRESOLINE) 50 MG tablet Take 1 tablet (50 mg total) by mouth 3 (three) times daily. 270 tablet 3  . ibuprofen (ADVIL,MOTRIN) 200 MG tablet Take 400 mg by mouth every 6 (six) hours as needed. Reported on 06/01/2015    . JANUVIA 100 MG tablet TAKE 1 TABLET BY MOUTH EVERY DAY 90 tablet 1  . Lancets MISC Use as directed E11.9 100 each 11  . metFORMIN (GLUCOPHAGE) 500 MG tablet Take 2 tablets (1,000 mg total) by mouth 2 (two) times daily. 360 tablet 3  . omeprazole (PRILOSEC) 20 MG capsule TAKE 1 CAPSULE BY MOUTH EVERY DAY 90 capsule 1  . potassium chloride (KLOR-CON 10) 10 MEQ tablet Take 2 tablets (20 mEq total) by mouth daily. 180 tablet 3  . pravastatin (PRAVACHOL) 40 MG tablet TAKE 1 TABLET BY MOUTH EVERY DAY 90 tablet 1   No current facility-administered medications on file prior to visit.     Allergies  Allergen Reactions  . Sulfa Antibiotics Hives and Swelling  . Vicodin [Hydrocodone-Acetaminophen] Itching    Family History  Problem Relation Age of Onset  . Hypertension Mother   . Heart disease Father   . Colon cancer Neg Hx   . Thyroid disease Neg Hx     BP 118/74   Pulse 78   Ht 6' 1"  (1.854 m)   Wt 186 lb 6.4 oz (84.6 kg)   SpO2 97%   BMI 24.59 kg/m    Review of Systems Denies neck pain.     Objective:   Physical Exam VITAL SIGNS:  See vs page.   GENERAL: no distress.   NECK: the right thyroid mass is again easily palpable.  It is freely mobile.  No palpable lymphadenopathy at the neck.     Lab Results  Component Value Date   TSH 0.82 12/07/2017         Assessment & Plan:  Thyroid nodule: clinically stable  Patient Instructions  We can skip the ultrasound this year.   most of the time, a "lumpy thyroid" will  eventually become overactive.  this is usually a slow process, happening over the span of many years.   Please return in 1 year.

## 2017-12-17 NOTE — Patient Instructions (Addendum)
We can skip the ultrasound this year.   most of the time, a "lumpy thyroid" will eventually become overactive.  this is usually a slow process, happening over the span of many years.   Please return in 1 year.

## 2017-12-22 ENCOUNTER — Other Ambulatory Visit: Payer: Self-pay | Admitting: Internal Medicine

## 2018-01-22 ENCOUNTER — Ambulatory Visit: Payer: 59 | Admitting: Cardiovascular Disease

## 2018-01-22 ENCOUNTER — Other Ambulatory Visit: Payer: Self-pay | Admitting: Internal Medicine

## 2018-01-27 ENCOUNTER — Ambulatory Visit: Payer: 59 | Admitting: Cardiovascular Disease

## 2018-01-27 ENCOUNTER — Encounter: Payer: Self-pay | Admitting: Cardiovascular Disease

## 2018-01-27 VITALS — BP 118/76 | HR 64 | Ht 73.0 in | Wt 186.0 lb

## 2018-01-27 DIAGNOSIS — E7800 Pure hypercholesterolemia, unspecified: Secondary | ICD-10-CM | POA: Diagnosis not present

## 2018-01-27 DIAGNOSIS — I10 Essential (primary) hypertension: Secondary | ICD-10-CM | POA: Diagnosis not present

## 2018-01-27 DIAGNOSIS — Z720 Tobacco use: Secondary | ICD-10-CM

## 2018-01-27 DIAGNOSIS — G4733 Obstructive sleep apnea (adult) (pediatric): Secondary | ICD-10-CM

## 2018-01-27 NOTE — Assessment & Plan Note (Signed)
History of hyperlipidemia on statin therapy with lipid profile performed 12/07/2017 revealing total cholesterol 132, LDL 55 and HDL of 54.

## 2018-01-27 NOTE — Progress Notes (Signed)
01/27/2018 [name]   05/06/1960  [id number]  Primary Physician Biagio Borg, MD Primary Cardiologist: Lorretta Harp MD Lupe Carney, [place]  HPI:  [name] is a 57 y.o.  male mildly overweight, single [place] American male, father of 10, grandfather to 2 grandchildren who works as a Administrator for [employer]. I saw him in the office 12/18/2016. His risk factors include tobacco abuse, hypertension and dyslipidemia. I catheterized him 12/25/15 October 23, 2008, revealing normal coronary arteries and normal LV function. I felt his chest pain symptoms were related to reflux. Dr. Jenny Reichmann has been adjusting antihypertensive medications. He is aware of salt intake. He does have significant LVH on his EKG with a past echo performed 2 years ago revealed moderate concentric LVH with normal LV function..Since I saw him a year ago he is completely asymptomatic. His blood pressure is in the much better control and the addition of hydralazine. His most recent lipid profile performed  12/07/2017 revealed total cholesterol 132, LDL 55 HDL 54.    Current Meds  Medication Sig  . amLODipine-benazepril (LOTREL) 10-40 MG capsule TAKE 1 CAPSULE BY MOUTH EVERY DAY  . aspirin 81 MG EC tablet Take 81 mg by mouth daily.    . Blood Glucose Monitoring Suppl (ONE TOUCH ULTRA 2) w/Device KIT Use as directed  W43.1  . BYSTOLIC 10 MG tablet TAKE 2 TABLETS (20 MG TOTAL) BY MOUTH 2 (TWO) TIMES DAILY.  . chlorthalidone (HYGROTON) 25 MG tablet TAKE 1 TABLET BY MOUTH EVERY DAY IN THE MORNING  . Docusate Calcium ([employer]S STOOL SOFTENER PO) Take 2 tablets by mouth 2 (two) times daily.   Marland Kitchen glucose blood (ONE TOUCH ULTRA TEST) test strip TEST THREE TIMES A DAY AS INSTRUCTED.  E11.9  . glucose blood (ONE TOUCH ULTRA TEST) test strip TEST THREE TIMES A DAY AS INSTRUCTED.  . hydrALAZINE (APRESOLINE) 50 MG tablet TAKE 1 TABLET BY MOUTH THREE TIMES A DAY  . ibuprofen (ADVIL,MOTRIN) 200 MG tablet Take 400 mg by  mouth every 6 (six) hours as needed. Reported on 06/01/2015  . JANUVIA 100 MG tablet TAKE 1 TABLET BY MOUTH EVERY DAY  . Lancets MISC Use as directed E11.9  . metFORMIN (GLUCOPHAGE) 500 MG tablet Take 2 tablets (1,000 mg total) by mouth 2 (two) times daily.  Marland Kitchen omeprazole (PRILOSEC) 20 MG capsule TAKE 1 CAPSULE BY MOUTH EVERY DAY  . potassium chloride (KLOR-CON 10) 10 MEQ tablet Take 2 tablets (20 mEq total) by mouth daily.  . pravastatin (PRAVACHOL) 40 MG tablet TAKE 1 TABLET BY MOUTH EVERY DAY     Allergies  Allergen Reactions  . Sulfa Antibiotics Hives and Swelling  . Vicodin [Hydrocodone-Acetaminophen] Itching    Social History   Socioeconomic History  . Marital status: Single    Spouse name: Not on file  . Number of children: 1  . Years of education: Not on file  . Highest education level: Not on file  Occupational History  . Occupation: work as truck Education administrator: [employer]  . Financial resource strain: Not on file  . Food insecurity:    Worry: Not on file    Inability: Not on file  . Transportation needs:    Medical: Not on file    Non-medical: Not on file  Tobacco Use  . Smoking status: Current Every Day Smoker    Packs/day: 0.50    Types: Cigarettes  . Smokeless tobacco: Never Used  Substance and Sexual Activity  . Alcohol use: Yes    Alcohol/week: 3.0 standard drinks    Types: 3 Standard drinks or equivalent per week    Comment: occaisionally  . Drug use: No  . Sexual activity: Not on file  Lifestyle  . Physical activity:    Days per week: Not on file    Minutes per session: Not on file  . Stress: Not on file  Relationships  . Social connections:    Talks on phone: Not on file    Gets together: Not on file    Attends religious service: Not on file    Active member of club or organization: Not on file    Attends meetings of clubs or organizations: Not on file    Relationship status: Not on file  . Intimate partner violence:      Fear of current or ex partner: Not on file    Emotionally abused: Not on file    Physically abused: Not on file    Forced sexual activity: Not on file  Other Topics Concern  . Not on file  Social History Narrative  . Not on file     Review of Systems: General: negative for chills, fever, night sweats or weight changes.  Cardiovascular: negative for chest pain, dyspnea on exertion, edema, orthopnea, palpitations, paroxysmal nocturnal dyspnea or shortness of breath Dermatological: negative for rash Respiratory: negative for cough or wheezing Urologic: negative for hematuria Abdominal: negative for nausea, vomiting, diarrhea, bright red blood per rectum, melena, or hematemesis Neurologic: negative for visual changes, syncope, or dizziness All other systems reviewed and are otherwise negative except as noted above.    Blood pressure 118/76, pulse 64, height _0  (1.854 m), weight 186 lb (84.4 kg).  General appearance: alert and no distress Neck: no adenopathy, no carotid bruit, no JVD, supple, symmetrical, trachea midline and thyroid not enlarged, symmetric, no tenderness/mass/nodules Lungs: clear to auscultation bilaterally Heart: regular rate and rhythm, S1, S2 normal, no murmur, click, rub or gallop Extremities: extremities normal, atraumatic, no cyanosis or edema Pulses: 2+ and symmetric Skin: Skin color, texture, turgor normal. No rashes or lesions Neurologic: Alert and oriented X 3, normal strength and tone. Normal symmetric reflexes. Normal coordination and gait  EKG sinus rhythm 64 with nonspecific ST and T wave changes. I Personally reviewed this EKG.  ASSESSMENT AND PLAN:   Hyperlipidemia History of hyperlipidemia on statin therapy with lipid profile performed 12/07/2017 revealing total cholesterol 132, LDL 55 and HDL of 54.  SLEEP APNEA, OBSTRUCTIVE History of obstructive sleep apnea on CPAP which he benefits from  Essential hypertension History of essential  hypertension her blood pressure measured today 118/76.  He is on amlodipine, benazepril, Bystolic and hydralazine.  Tobacco abuse History of ongoing tobacco abuse of 1 pack/day recalcitrant risk factor modification.      Lorretta Harp MD FACP,FACC,FAHA, [facility] 01/27/2018 12:01 PM

## 2018-01-27 NOTE — Assessment & Plan Note (Signed)
History of obstructive sleep apnea on CPAP which he benefits from 

## 2018-01-27 NOTE — Assessment & Plan Note (Signed)
History of essential hypertension her blood pressure measured today 118/76.  He is on amlodipine, benazepril, Bystolic and hydralazine.

## 2018-01-27 NOTE — Patient Instructions (Signed)
Medication Instructions:  Your physician recommends that you continue on your current medications as directed. Please refer to the Current Medication list given to you today.  If you need a refill on your cardiac medications before your next appointment, please call your pharmacy.   Lab work: none If you have labs (blood work) drawn today and your tests are completely normal, you will receive your results only by: . MyChart Message (if you have MyChart) OR . A paper copy in the mail If you have any lab test that is abnormal or we need to change your treatment, we will call you to review the results.  Testing/Procedures: none  Follow-Up: At [facility], you and your health needs are our priority.  As part of our continuing mission to provide you with exceptional heart care, we have created designated Provider Care Teams.  These Care Teams include your primary Cardiologist (physician) and Advanced Practice Providers (APPs -  Physician Assistants and Nurse Practitioners) who all work together to provide you with the care you need, when you need it. You will need a follow up appointment in 12 months.  Please call our office 2 months in advance to schedule this appointment.  You may see Dr. Berry or one of the following Advanced Practice Providers on your designated Care Team:   Luke Kilroy, PA-C Krista Kroeger, PA-C . Callie Goodrich, PA-C  Any Other Special Instructions Will Be Listed Below (If Applicable).    

## 2018-01-27 NOTE — Assessment & Plan Note (Addendum)
History of ongoing tobacco abuse of 1 pack/day recalcitrant risk factor modification. 

## 2018-02-06 ENCOUNTER — Other Ambulatory Visit: Payer: Self-pay | Admitting: Internal Medicine

## 2018-02-12 ENCOUNTER — Telehealth: Payer: Self-pay | Admitting: Internal Medicine

## 2018-02-12 MED ORDER — OMEPRAZOLE 40 MG PO CPDR: 40.0000 mg | DELAYED_RELEASE_CAPSULE | Freq: Every day | ORAL | 3 refills | Status: DC

## 2018-02-12 NOTE — Addendum Note (Signed)
Addended by: Biagio Borg on: 02/12/2018 03:29 PM   Modules accepted: Orders

## 2018-02-12 NOTE — Telephone Encounter (Signed)
Pt is requesting an increase with the dosage of his omeprazole 20 mg to 40 mg daily.

## 2018-02-12 NOTE — Telephone Encounter (Signed)
Ok this is done 

## 2018-02-12 NOTE — Telephone Encounter (Signed)
Copied from [place] [id number]. Topic: Quick Communication - Rx Refill/Question >> Feb 12, 2018  1:55 PM Keene Breath wrote: Medication: omeprazole (PRILOSEC) 20 MG capsule, patient would like to up the dosage to 40mg , which he states relieves him quicker  Patient called to request a refill for the above medication  Preferred Pharmacy (with phone number or street name): [id number] [address], [place] [phone] (Phone) [phone] (Fax)

## 2018-02-14 ENCOUNTER — Encounter: Payer: Self-pay | Admitting: Internal Medicine

## 2018-02-14 DIAGNOSIS — E876 Hypokalemia: Secondary | ICD-10-CM

## 2018-02-15 ENCOUNTER — Other Ambulatory Visit: Payer: Self-pay | Admitting: Internal Medicine

## 2018-02-15 ENCOUNTER — Other Ambulatory Visit (INDEPENDENT_AMBULATORY_CARE_PROVIDER_SITE_OTHER): Payer: 59

## 2018-02-15 DIAGNOSIS — E876 Hypokalemia: Secondary | ICD-10-CM

## 2018-02-15 LAB — BASIC METABOLIC PANEL
BUN: 23 mg/dL (ref 6–23)
CALCIUM: 9.6 mg/dL (ref 8.4–10.5)
CO2: 29 meq/L (ref 19–32)
Chloride: 98 mEq/L (ref 96–112)
Creatinine, Ser: 1.44 mg/dL (ref 0.40–1.50)
GFR: 64.63 mL/min (ref >60.00)
Glucose, Bld: 104 mg/dL — ABNORMAL HIGH (ref 70–99)
Potassium: 3.1 mEq/L — ABNORMAL LOW (ref 3.5–5.1)
SODIUM: 138 meq/L (ref 135–145)

## 2018-02-15 MED ORDER — POTASSIUM CHLORIDE ER 10 MEQ PO TBCR: 30.0000 meq | EXTENDED_RELEASE_TABLET | Freq: Every day | ORAL | 3 refills | Status: DC

## 2018-02-15 NOTE — Telephone Encounter (Signed)
shirron to let pt know what I said in the [place] message, thanks

## 2018-02-16 ENCOUNTER — Telehealth: Payer: Self-pay

## 2018-02-16 NOTE — Telephone Encounter (Signed)
Pt has viewed results via MyChart  

## 2018-02-16 NOTE — Telephone Encounter (Signed)
-----   Message from Biagio Borg, MD sent at 02/15/2018  5:23 PM EST ----- Left message on MyChart, pt to cont same tx except  The test results show that your current treatment is OK, as the potassium level is actually low.  It appears you need to take 3 potassium pills per day.  I will send the prescription, and you should hear from the office as well.Redmond Baseman to please inform pt, I will do rx

## 2018-02-23 ENCOUNTER — Other Ambulatory Visit: Payer: Self-pay | Admitting: Internal Medicine

## 2018-02-23 MED ORDER — POTASSIUM CHLORIDE ER 10 MEQ PO TBCR: 30.0000 meq | EXTENDED_RELEASE_TABLET | Freq: Every day | ORAL | 3 refills | Status: DC

## 2018-04-21 ENCOUNTER — Ambulatory Visit: Payer: Self-pay

## 2018-04-21 NOTE — Telephone Encounter (Addendum)
Comments:  My chest is hurting and it feels like it is beating too slow. Can I see you today?  Per My Chart request for appointment Attempted to call pt home and mobile number. Left VM on mobile number to call office.  Attempted again to contact pt about his symptoms. Left VM on mobile number to call office.

## 2018-04-21 NOTE — Telephone Encounter (Signed)
Talked with patient and he states this is a new problem---he states this gets better when he takes baby aspirin---it doesn't happen every day, about every other day, one episode---patient advised he should be seen soon---he does not want to go to ED, prefers to call his cardiologist to see if he can be seen at that office tomorrow or Friday---patient advised if he cant get in quickly with cardiology, call [facility] back, we should be able to get him in with a nurse practitioner either Thursday or Friday if he calls back quickly--can talk with tamara,RN at [place] office if needed

## 2018-04-23 ENCOUNTER — Ambulatory Visit: Payer: 59 | Admitting: Internal Medicine

## 2018-04-23 ENCOUNTER — Encounter: Payer: Self-pay | Admitting: Internal Medicine

## 2018-04-23 ENCOUNTER — Ambulatory Visit (INDEPENDENT_AMBULATORY_CARE_PROVIDER_SITE_OTHER)
Admission: RE | Admit: 2018-04-23 | Discharge: 2018-04-23 | Disposition: A | Discharge status: Home / Self Care | Payer: 59 | Source: Ambulatory Visit | Attending: Internal Medicine | Admitting: Internal Medicine

## 2018-04-23 ENCOUNTER — Ambulatory Visit: Payer: Self-pay

## 2018-04-23 VITALS — BP 118/82 | HR 65 | Temp 99.0°F | Ht 73.0 in | Wt 186.0 lb

## 2018-04-23 DIAGNOSIS — R6889 Other general symptoms and signs: Secondary | ICD-10-CM | POA: Diagnosis not present

## 2018-04-23 DIAGNOSIS — I10 Essential (primary) hypertension: Secondary | ICD-10-CM

## 2018-04-23 DIAGNOSIS — R059 Cough, unspecified: Secondary | ICD-10-CM | POA: Insufficient documentation

## 2018-04-23 DIAGNOSIS — R062 Wheezing: Secondary | ICD-10-CM | POA: Insufficient documentation

## 2018-04-23 DIAGNOSIS — R05 Cough: Secondary | ICD-10-CM

## 2018-04-23 LAB — POCT INFLUENZA A/B
Influenza A, POC: NEGATIVE
Influenza B, POC: NEGATIVE

## 2018-04-23 IMAGING — DX DG CHEST 2V
2 series · 2 of 2 positions shown · non-contrast
Comparison: Radiograph [DATE]

CLINICAL DATA: Cough and wheezing. One day onset fever, cough, and
wheezing.

EXAM:
CHEST - 2 VIEW

[chest pa]
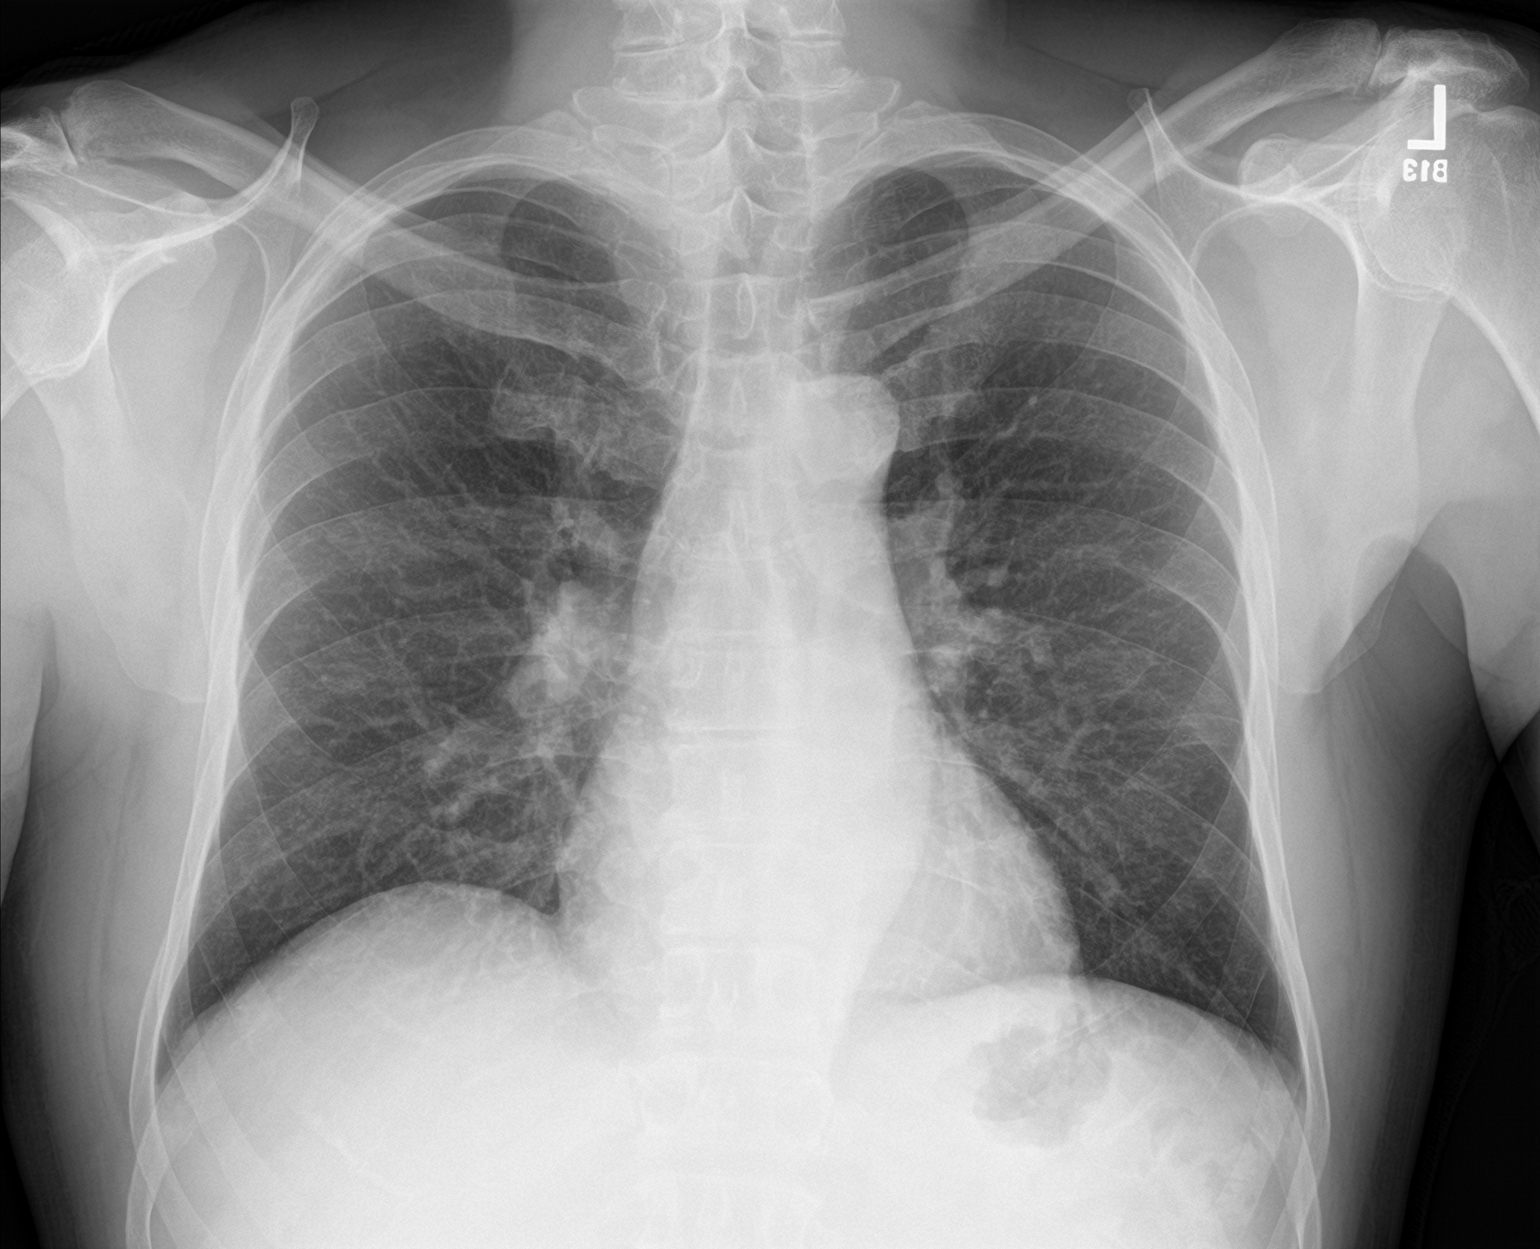

[chest lat]
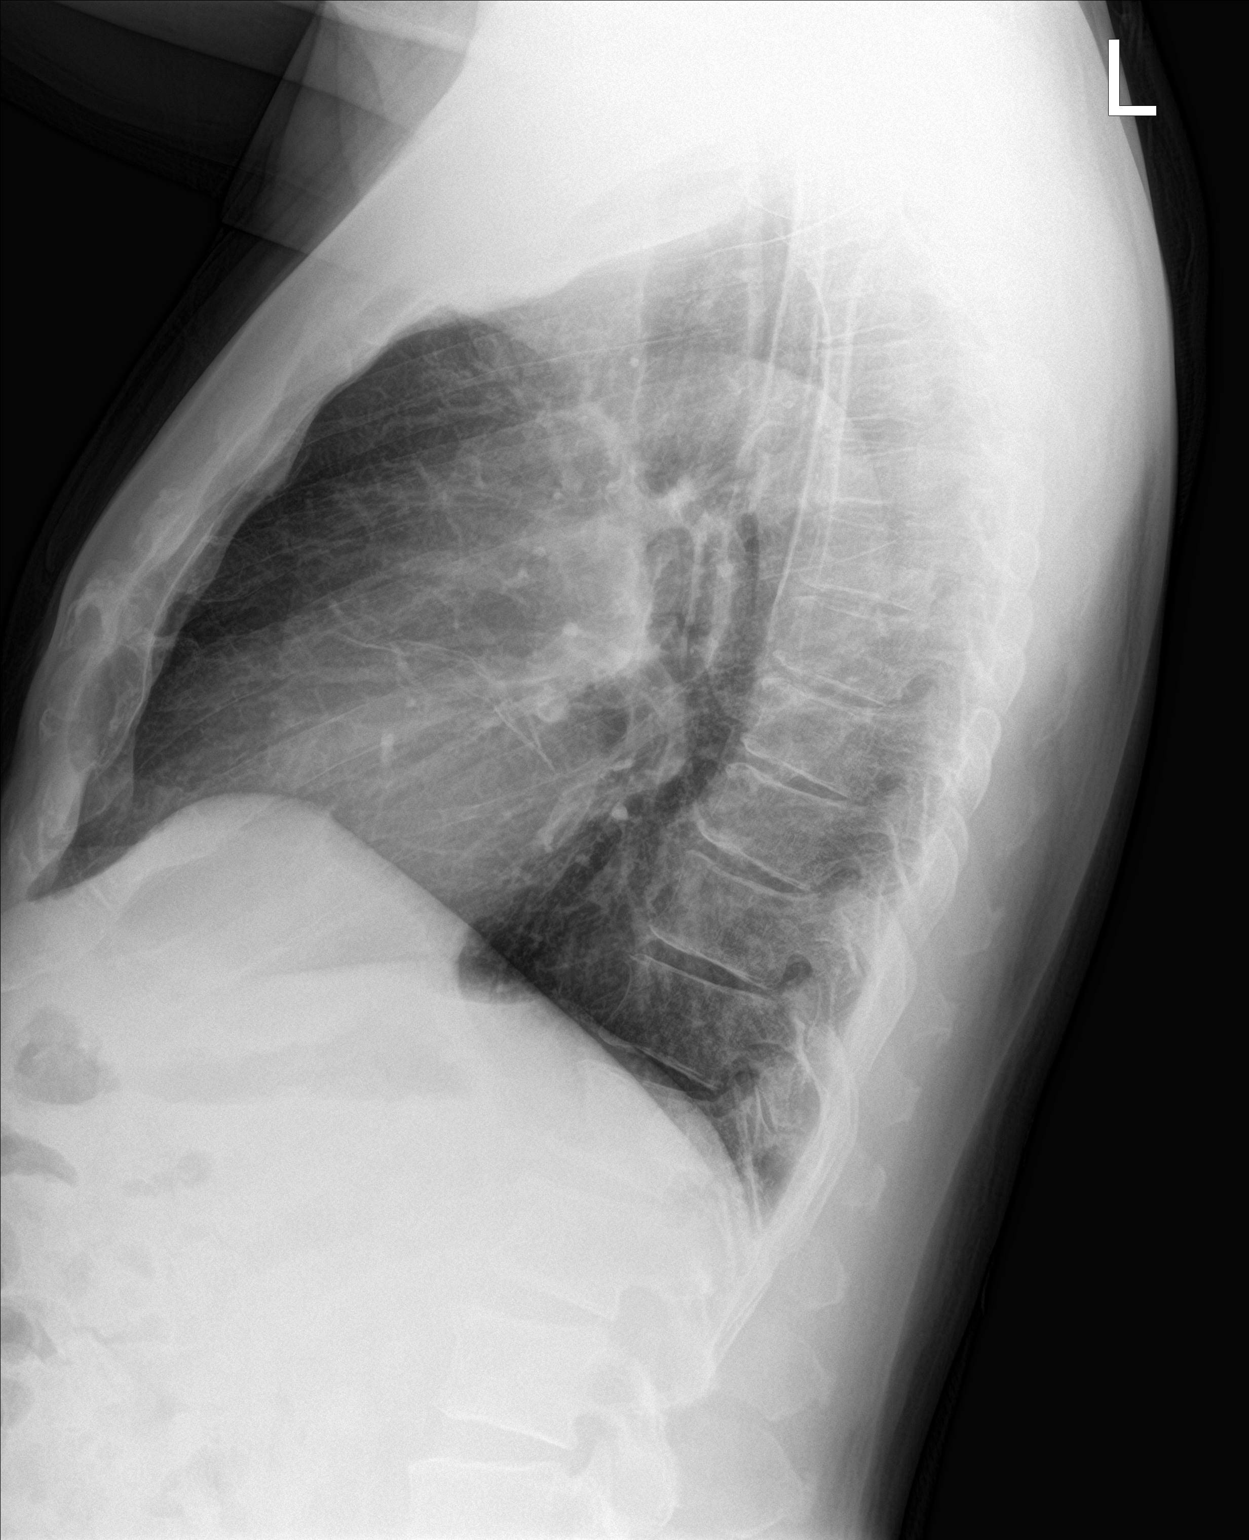

[2 of 2 positions shown; findings below may reference images not displayed]

FINDINGS: The cardiomediastinal contours are normal. Minimal bronchial
thickening. Minor subsegmental atelectasis in the lingula. Pulmonary
vasculature is normal. No consolidation, pleural effusion, or
pneumothorax. No acute osseous abnormalities are seen. Mild
degenerative change of both shoulders.
IMPRESSION: Minimal bronchial thickening suggesting acute bronchitis. Minor
subsegmental atelectasis in the lingula.

## 2018-04-23 MED ORDER — PREDNISONE 10 MG PO TABS: ORAL_TABLET | ORAL | 0 refills | Status: DC

## 2018-04-23 MED ORDER — PROMETHAZINE-CODEINE 6.25-10 MG/5ML PO SYRP: 5.0000 mL | ORAL_SOLUTION | ORAL | 0 refills | Status: AC | PRN

## 2018-04-23 MED ORDER — LEVOFLOXACIN 500 MG PO TABS: 500.0000 mg | ORAL_TABLET | Freq: Every day | ORAL | 0 refills | Status: AC

## 2018-04-23 MED ORDER — ALBUTEROL SULFATE HFA 108 (90 BASE) MCG/ACT IN AERS: 2.0000 | INHALATION_SPRAY | Freq: Four times a day (QID) | RESPIRATORY_TRACT | 2 refills | Status: DC | PRN

## 2018-04-23 MED ORDER — METHYLPREDNISOLONE ACETATE 80 MG/ML IJ SUSP
80.0000 mg | Freq: Once | INTRAMUSCULAR | Status: AC
  Administered 2018-04-23: 80 mg via INTRAMUSCULAR

## 2018-04-23 NOTE — Progress Notes (Addendum)
Subjective:    Patient ID: [name], male    DOB: 1960-11-13, 57 y.o.   MRN: [id number]  HPI  Here with acute onset mild to mod 2-3 days ST, HA, general weakness and malaise, with prod cough greenish sputum, but Pt denies chest pain, increased sob or doe, wheezing, orthopnea, PND, increased LE swelling, palpitations, dizziness or syncope, except for mild wheezing and sob since last PM.   Pt denies polydipsia, polyuria Past Medical History:  Diagnosis Date  . Chest pain 02/13/2012   echo - EF >55%;mod concentric L ventricular hypertrophy; atrial septum aneurysmal; patent foramen ovale suspected on color flow, LA mil/mod dilation; RV systolic pressure [id number]  . Chest pain 06/09/2007   echo - EF 45-50%; interarterial shunt; mild valvular regurgitation;   . Chest pain 06/09/2007   Myoview - EF 53%; normal perfusion all regions;EKG negative for ischemia   . Diabetes mellitus    type II  . DIABETES MELLITUS, TYPE II 08/29/2009  . GERD (gastroesophageal reflux disease) 08/12/2010  . Hemorrhoids   . History of anal fissures   . Hyperlipidemia   . HYPERLIPIDEMIA 08/29/2009  . Hypertension   . HYPERTENSION 01/14/2008  . Lightheadedness 05/10/2002   30d monitor unremarkable  . LVH (left ventricular hypertrophy)    with systolic dysfunction by echo 10/03, EF 45-50% - Dr. Melvern Banker  . Multinodular goiter 12/06/2015  . Nonspecific ST-T wave electrocardiographic changes 08/11/2006   Myoview - EF 49%; normal perfusion all regions, EKG negative for ischemia  . OSA (obstructive sleep apnea)   . Preventative health care 08/11/2010  . Sleep apnea 08/13/2008   AHI 1.2; sleep study 10/2007 - AHI 9.47at 15cm H2O and 31.3 at 16cm H2O  . SLEEP APNEA, OBSTRUCTIVE 01/14/2008   Past Surgical History:  Procedure Laterality Date  . ANAL SPHINCTEROTOMY  08/20/11  . CARDIAC CATHETERIZATION  10/23/2008   normal coronary arteries and LV function  . COLONOSCOPY    . POLYPECTOMY      reports that he has been smoking  cigarettes. He has been smoking about 0.50 packs per day. He has never used smokeless tobacco. He reports current alcohol use of about 3.0 standard drinks of alcohol per week. He reports that he does not use drugs. family history includes Heart disease in his father; Hypertension in his mother. Allergies  Allergen Reactions  . Sulfa Antibiotics Hives and Swelling  . Vicodin [Hydrocodone-Acetaminophen] Itching   Current Outpatient Medications on File Prior to Visit  Medication Sig Dispense Refill  . amLODipine-benazepril (LOTREL) 10-40 MG capsule TAKE 1 CAPSULE BY MOUTH EVERY DAY 90 capsule 3  . aspirin 81 MG EC tablet Take 81 mg by mouth daily.      . Blood Glucose Monitoring Suppl (ONE TOUCH ULTRA 2) w/Device KIT Use as directed  E11.9 1 each 0  . BYSTOLIC 10 MG tablet TAKE 2 TABLETS (20 MG TOTAL) BY MOUTH 2 (TWO) TIMES DAILY. 360 tablet 1  . chlorthalidone (HYGROTON) 25 MG tablet TAKE 1 TABLET BY MOUTH EVERY DAY IN THE MORNING 90 tablet 2  . Docusate Calcium ([employer] STOOL SOFTENER PO) Take 2 tablets by mouth 2 (two) times daily.     Marland Kitchen glucose blood (ONE TOUCH ULTRA TEST) test strip TEST THREE TIMES A DAY AS INSTRUCTED.  E11.9 100 each 11  . glucose blood (ONE TOUCH ULTRA TEST) test strip TEST THREE TIMES A DAY AS INSTRUCTED. 100 each 2  . hydrALAZINE (APRESOLINE) 50 MG tablet TAKE 1 TABLET BY MOUTH THREE TIMES  A DAY 270 tablet 2  . ibuprofen (ADVIL,MOTRIN) 200 MG tablet Take 400 mg by mouth every 6 (six) hours as needed. Reported on 06/01/2015    . JANUVIA 100 MG tablet TAKE 1 TABLET BY MOUTH EVERY DAY 90 tablet 1  . Lancets MISC Use as directed E11.9 100 each 11  . metFORMIN (GLUCOPHAGE) 500 MG tablet TAKE 2 TABLETS BY MOUTH TWICE A DAY 360 tablet 3  . omeprazole (PRILOSEC) 40 MG capsule Take 1 capsule (40 mg total) by mouth daily. 90 capsule 3  . potassium chloride (KLOR-CON 10) 10 MEQ tablet Take 3 tablets (30 mEq total) by mouth daily. 270 tablet 3  . pravastatin (PRAVACHOL) 40 MG tablet  TAKE 1 TABLET BY MOUTH EVERY DAY 90 tablet 1   No current facility-administered medications on file prior to visit.    Review of Systems  Constitutional: Negative for other unusual diaphoresis or sweats HENT: Negative for ear discharge or swelling Eyes: Negative for other worsening visual disturbances Respiratory: Negative for stridor or other swelling  Gastrointestinal: Negative for worsening distension or other blood Genitourinary: Negative for retention or other urinary change Musculoskeletal: Negative for other MSK pain or swelling Skin: Negative for color change or other new lesions Neurological: Negative for worsening tremors and other numbness  Psychiatric/Behavioral: Negative for worsening agitation or other fatigue All other system neg per pt    Objective:   Physical Exam BP 118/82   Pulse 65   Temp 99 F (37.2 C) (Oral)   Ht 6' 1"  (1.854 m)   Wt 186 lb (84.4 kg)   SpO2 97%   BMI 24.54 kg/m  VS noted, mild ill Constitutional: Pt appears in NAD HENT: Head: NCAT.  Right Ear: External ear normal.  Left Ear: External ear normal.  Eyes: . Pupils are equal, round, and reactive to light. Conjunctivae and EOM are normal Nose: without d/c or deformity Neck: Neck supple. Gross normal ROM Cardiovascular: Normal rate and regular rhythm.   Pulmonary/Chest: Effort normal and breath sounds decresed without rales but with few mild trace wheezing.  Neurological: Pt is alert. At baseline orientation, motor grossly intact Skin: Skin is warm. No rashes, other new lesions, no LE edema Psychiatric: Pt behavior is normal without agitation  No other exam findings Lab Results  Component Value Date   WBC 6.8 12/07/2017   HGB 12.8 (L) 12/07/2017   HCT 37.5 (L) 12/07/2017   PLT 230.0 12/07/2017   GLUCOSE 104 (H) 02/15/2018   CHOL 132 12/07/2017   TRIG 109.0 12/07/2017   HDL 54.80 12/07/2017   LDLDIRECT 117.9 01/14/2008   LDLCALC 55 12/07/2017   ALT 18 12/07/2017   AST 14  12/07/2017   NA 138 02/15/2018   K 3.1 (L) 02/15/2018   CL 98 02/15/2018   CREATININE 1.44 02/15/2018   BUN 23 02/15/2018   CO2 29 02/15/2018   TSH 0.82 12/07/2017   PSA 0.39 12/07/2017   INR 1.09 08/05/2010   HGBA1C 5.7 12/07/2017   MICROALBUR 3.8 (H) 12/07/2017      POCT Influenza A/B  Order: [id number]  Status:  Final result Visible to patient:  No (Not Released) Dx:  Flu-like symptoms   Ref Range & Units 15:51  Influenza A, POC Negative Negative   Influenza B, POC Negative Negative            Assessment & Plan:

## 2018-04-23 NOTE — Telephone Encounter (Signed)
Noted  

## 2018-04-23 NOTE — Telephone Encounter (Signed)
Pt. Reports he woke up with Temp. 100.3, chills, dry cough. Requesting an appointment.Appointment made for today with his provider. Wife reports he "has a lot of added stress at work" as well. " He has said he has had chest pain on and off since October 2019. No chest pain today. Instructed if he develops chest pain to go to ED for evaluation.  Reason for Disposition . Fever present > 3 days (72 hours)  Answer Assessment - Initial Assessment Questions 1. TEMPERATURE: "What is the most recent temperature?"  "How was it measured?"      100.3 2. ONSET: "When did the fever start?"      This morning 3. SYMPTOMS: "Do you have any other symptoms besides the fever?"  (e.g., colds, headache, sore throat, earache, cough, rash, diarrhea, vomiting, abdominal pain)     Chills, dry cough 4. CAUSE: If there are no symptoms, ask: "What do you think is causing the fever?"      Cough 5. CONTACTS: "Does anyone else in the family have an infection?"     No 6. TREATMENT: "What have you done so far to treat this fever?" (e.g., medications)     Nothing 7. IMMUNOCOMPROMISE: "Do you have of the following: diabetes, HIV positive, splenectomy, cancer chemotherapy, chronic steroid treatment, transplant patient, etc."     Diabetes, HTN 8. PREGNANCY: "Is there any chance you are pregnant?" "When was your last menstrual period?"     N/A 9. TRAVEL: "Have you traveled out of the country in the last month?" (e.g., travel history, exposures)     No  Protocols used: FEVER-A-AH

## 2018-04-23 NOTE — Patient Instructions (Signed)
You had the steroid shot today  Please take all new medication as prescribed - the antibiotic, cough medicine as needed, prednisone, and Inhaler as needed  Please continue all other medications as before, and refills have been done if requested.  Please have the pharmacy call with any other refills you may need.  Please keep your appointments with your specialists as you may have planned  Please go to the XRAY Department in the Basement (go straight as you get off the elevator) for the x-ray testing  You will be contacted by phone if any changes need to be made immediately.  Otherwise, you will receive a letter about your results with an explanation, but please check with MyChart first.  Please remember to sign up for MyChart if you have not done so, as this will be important to you in the future with finding out test results, communicating by private email, and scheduling acute appointments online when needed.

## 2018-04-24 NOTE — Assessment & Plan Note (Signed)
stable overall by history and exam, recent data reviewed with pt, and pt to continue medical treatment as before,  to f/u any worsening symptoms or concerns  

## 2018-04-24 NOTE — Assessment & Plan Note (Signed)
Mild to mod, c/w bronchitis vs pna, for cxr, for antibx course, cough med prn,  to f/u any worsening symptoms or concerns 

## 2018-04-24 NOTE — Assessment & Plan Note (Signed)
Mild, for depomedrol IM 80, predpac asd,  to f/u any worsening symptoms or concerns

## 2018-05-21 ENCOUNTER — Other Ambulatory Visit: Payer: Self-pay | Admitting: Internal Medicine

## 2018-05-23 ENCOUNTER — Other Ambulatory Visit: Payer: Self-pay | Admitting: Internal Medicine

## 2018-06-01 ENCOUNTER — Other Ambulatory Visit (INDEPENDENT_AMBULATORY_CARE_PROVIDER_SITE_OTHER): Payer: 59

## 2018-06-01 DIAGNOSIS — Z794 Long term (current) use of insulin: Secondary | ICD-10-CM | POA: Diagnosis not present

## 2018-06-01 DIAGNOSIS — E0800 Diabetes mellitus due to underlying condition with hyperosmolarity without nonketotic hyperglycemic-hyperosmolar coma (NKHHC): Secondary | ICD-10-CM | POA: Diagnosis not present

## 2018-06-01 LAB — HEPATIC FUNCTION PANEL
ALT: 17 U/L (ref 0–53)
AST: 25 U/L (ref 0–37)
Albumin: 4.6 g/dL (ref 3.5–5.2)
Alkaline Phosphatase: 69 U/L (ref 39–117)
Bilirubin, Direct: 0.1 mg/dL (ref 0.0–0.3)
Total Bilirubin: 0.5 mg/dL (ref 0.2–1.2)
Total Protein: 7.2 g/dL (ref 6.0–8.3)

## 2018-06-01 LAB — HEMOGLOBIN A1C: Hgb A1c MFr Bld: 5.8 % (ref 4.6–6.5)

## 2018-06-01 LAB — BASIC METABOLIC PANEL
BUN: 26 mg/dL — ABNORMAL HIGH (ref 6–23)
CALCIUM: 9.6 mg/dL (ref 8.4–10.5)
CO2: 28 mEq/L (ref 19–32)
Chloride: 103 mEq/L (ref 96–112)
Creatinine, Ser: 1.42 mg/dL (ref 0.40–1.50)
GFR: 61.74 mL/min (ref >60.00)
Glucose, Bld: 102 mg/dL — ABNORMAL HIGH (ref 70–99)
Potassium: 3.4 mEq/L — ABNORMAL LOW (ref 3.5–5.1)
SODIUM: 143 meq/L (ref 135–145)

## 2018-06-01 LAB — LIPID PANEL
Cholesterol: 140 mg/dL (ref 0–200)
HDL: 61 mg/dL (ref >39.00)
LDL Cholesterol: 61 mg/dL (ref 0–99)
NonHDL: 78.57
Total CHOL/HDL Ratio: 2
Triglycerides: 87 mg/dL (ref 0.0–149.0)
VLDL: 17.4 mg/dL (ref 0.0–40.0)

## 2018-06-03 ENCOUNTER — Ambulatory Visit: Payer: 59 | Admitting: Internal Medicine

## 2018-06-03 ENCOUNTER — Encounter: Payer: Self-pay | Admitting: Internal Medicine

## 2018-06-03 VITALS — BP 130/76 | HR 77 | Temp 97.0°F | Wt 177.7 lb

## 2018-06-03 DIAGNOSIS — E7800 Pure hypercholesterolemia, unspecified: Secondary | ICD-10-CM

## 2018-06-03 DIAGNOSIS — J329 Chronic sinusitis, unspecified: Secondary | ICD-10-CM | POA: Insufficient documentation

## 2018-06-03 DIAGNOSIS — E0800 Diabetes mellitus due to underlying condition with hyperosmolarity without nonketotic hyperglycemic-hyperosmolar coma (NKHHC): Secondary | ICD-10-CM

## 2018-06-03 DIAGNOSIS — I10 Essential (primary) hypertension: Secondary | ICD-10-CM

## 2018-06-03 DIAGNOSIS — Z0000 Encounter for general adult medical examination without abnormal findings: Secondary | ICD-10-CM

## 2018-06-03 DIAGNOSIS — Z794 Long term (current) use of insulin: Secondary | ICD-10-CM

## 2018-06-03 MED ORDER — AZITHROMYCIN 250 MG PO TABS: ORAL_TABLET | ORAL | 1 refills | Status: DC

## 2018-06-03 MED ORDER — METFORMIN HCL ER 500 MG PO TB24: 1000.0000 mg | ORAL_TABLET | Freq: Every day | ORAL | 3 refills | Status: DC

## 2018-06-03 MED ORDER — HYDROCODONE-HOMATROPINE 5-1.5 MG/5ML PO SYRP: 5.0000 mL | ORAL_SOLUTION | Freq: Four times a day (QID) | ORAL | 0 refills | Status: AC | PRN

## 2018-06-03 NOTE — Patient Instructions (Signed)
Please take all new medication as prescribed - the antibiotic, and cough medicine if needed  OK to change the metformin to the lower dose Metformin ER 500 mg  - 2 pills only in the AM  Please call if you are still having possible low sugar symptoms at 4AM, as you may need a bedtime snack  Please continue all other medications as before, and refills have been done if requested.  Please have the pharmacy call with any other refills you may need.  Please continue your efforts at being more active, low cholesterol diet, and weight control.  Please keep your appointments with your specialists as you may have planned  Please return in 6 months, or sooner if needed, with Lab testing done 3-5 days before

## 2018-06-03 NOTE — Assessment & Plan Note (Signed)
Mild to mod, for antibx course,  to f/u any worsening symptoms or concerns 

## 2018-06-03 NOTE — Assessment & Plan Note (Signed)
stable overall by history and exam, recent data reviewed with pt, and pt to continue medical treatment as before,  to f/u any worsening symptoms or concerns  

## 2018-06-03 NOTE — Assessment & Plan Note (Signed)
Having 4am sweats, possible overcontrolled sugar, to change metformin to metformin ER 500 - 2 in am, cont all other tx, for wt control, diet, f/u lab next visit

## 2018-06-03 NOTE — Progress Notes (Signed)
Subjective:    Patient ID: [name], male    DOB: 12/16/1960, 58 y.o.   MRN: [id number]  HPI   Here with 2-3 days acute onset fever, facial pain, pressure, headache, general weakness and malaise, and greenish d/c, with mild ST and cough, but pt denies chest pain, wheezing, increased sob or doe, orthopnea, PND, increased LE swelling, palpitations, dizziness or syncope.  Here to f/u; overall doing ok,  Pt denies chest pain, increasing sob or doe, wheezing, orthopnea, PND, increased LE swelling, palpitations, dizziness or syncope.  Pt denies new neurological symptoms such as new headache, or facial or extremity weakness or numbness.  Pt denies polydipsia, polyuria, or low sugar episode.  Pt states overall good compliance with meds, mostly trying to follow appropriate diet, with wt overall stable,  but little exercise however. Wt Readings from Last 3 Encounters:  04/23/18 186 lb (84.4 kg)  01/27/18 186 lb (84.4 kg)  12/17/17 186 lb 6.4 oz (84.6 kg)   Past Medical History:  Diagnosis Date  . Chest pain 02/13/2012   echo - EF >55%;mod concentric L ventricular hypertrophy; atrial septum aneurysmal; patent foramen ovale suspected on color flow, LA mil/mod dilation; RV systolic pressure [id number]  . Chest pain 06/09/2007   echo - EF 45-50%; interarterial shunt; mild valvular regurgitation;   . Chest pain 06/09/2007   Myoview - EF 53%; normal perfusion all regions;EKG negative for ischemia   . Diabetes mellitus    type II  . DIABETES MELLITUS, TYPE II 08/29/2009  . GERD (gastroesophageal reflux disease) 08/12/2010  . Hemorrhoids   . History of anal fissures   . Hyperlipidemia   . HYPERLIPIDEMIA 08/29/2009  . Hypertension   . HYPERTENSION 01/14/2008  . Lightheadedness 05/10/2002   30d monitor unremarkable  . LVH (left ventricular hypertrophy)    with systolic dysfunction by echo 10/03, EF 45-50% - Dr. Melvern Banker  . Multinodular goiter 12/06/2015  . Nonspecific ST-T wave electrocardiographic changes  08/11/2006   Myoview - EF 49%; normal perfusion all regions, EKG negative for ischemia  . OSA (obstructive sleep apnea)   . Preventative health care 08/11/2010  . Sleep apnea 08/13/2008   AHI 1.2; sleep study 10/2007 - AHI 9.47at 15cm H2O and 31.3 at 16cm H2O  . SLEEP APNEA, OBSTRUCTIVE 01/14/2008   Past Surgical History:  Procedure Laterality Date  . ANAL SPHINCTEROTOMY  08/20/11  . CARDIAC CATHETERIZATION  10/23/2008   normal coronary arteries and LV function  . COLONOSCOPY    . POLYPECTOMY      reports that he has been smoking cigarettes. He has been smoking about 0.50 packs per day. He has never used smokeless tobacco. He reports current alcohol use of about 3.0 standard drinks of alcohol per week. He reports that he does not use drugs. family history includes Heart disease in his father; Hypertension in his mother. Allergies  Allergen Reactions  . Sulfa Antibiotics Hives and Swelling  . Vicodin [Hydrocodone-Acetaminophen] Itching   Current Outpatient Medications on File Prior to Visit  Medication Sig Dispense Refill  . albuterol (PROVENTIL HFA;VENTOLIN HFA) 108 (90 Base) MCG/ACT inhaler Inhale 2 puffs into the lungs every 6 (six) hours as needed for wheezing or shortness of breath. 1 Inhaler 2  . amLODipine-benazepril (LOTREL) 10-40 MG capsule TAKE 1 CAPSULE BY MOUTH EVERY DAY 90 capsule 3  . aspirin 81 MG EC tablet Take 81 mg by mouth daily.      . Blood Glucose Monitoring Suppl (ONE TOUCH ULTRA 2) w/Device KIT Use  as directed  E11.9 1 each 0  . BYSTOLIC 10 MG tablet TAKE 2 TABLETS (20 MG TOTAL) BY MOUTH 2 (TWO) TIMES DAILY. 360 tablet 1  . chlorthalidone (HYGROTON) 25 MG tablet TAKE 1 TABLET BY MOUTH EVERY DAY IN THE MORNING 90 tablet 2  . Docusate Calcium ([employer] STOOL SOFTENER PO) Take 2 tablets by mouth 2 (two) times daily.     Marland Kitchen glucose blood (ONE TOUCH ULTRA TEST) test strip TEST THREE TIMES A DAY AS INSTRUCTED. 100 each 2  . hydrALAZINE (APRESOLINE) 50 MG tablet TAKE 1 TABLET BY  MOUTH THREE TIMES A DAY 270 tablet 2  . ibuprofen (ADVIL,MOTRIN) 200 MG tablet Take 400 mg by mouth every 6 (six) hours as needed. Reported on 06/01/2015    . JANUVIA 100 MG tablet TAKE 1 TABLET BY MOUTH EVERY DAY 90 tablet 1  . Lancets MISC Use as directed E11.9 100 each 11  . omeprazole (PRILOSEC) 40 MG capsule Take 1 capsule (40 mg total) by mouth daily. 90 capsule 3  . potassium chloride (KLOR-CON 10) 10 MEQ tablet Take 3 tablets (30 mEq total) by mouth daily. 270 tablet 3  . pravastatin (PRAVACHOL) 40 MG tablet TAKE 1 TABLET BY MOUTH EVERY DAY 90 tablet 1   No current facility-administered medications on file prior to visit.    Review of Systems  Constitutional: Negative for other unusual diaphoresis or sweats HENT: Negative for ear discharge or swelling Eyes: Negative for other worsening visual disturbances Respiratory: Negative for stridor or other swelling  Gastrointestinal: Negative for worsening distension or other blood Genitourinary: Negative for retention or other urinary change Musculoskeletal: Negative for other MSK pain or swelling Skin: Negative for color change or other new lesions Neurological: Negative for worsening tremors and other numbness  Psychiatric/Behavioral: Negative for worsening agitation or other fatigue All other system neg per pt    Objective:   Physical Exam BP 130/76   Pulse 77   Temp (!) 97 F (36.1 C)   Wt 177 lb 11.2 oz (80.6 kg)   BMI 23.44 kg/m  VS noted, mild ill Constitutional: Pt appears in NAD HENT: Head: NCAT.  Right Ear: External ear normal.  Left Ear: External ear normal.  Eyes: . Pupils are equal, round, and reactive to light. Conjunctivae and EOM are normal Bilat tm's with mild erythema.  Max sinus areas mild tender.  Pharynx with mild erythema, no exudate Nose: without d/c or deformity Neck: Neck supple. Gross normal ROM Cardiovascular: Normal rate and regular rhythm.   Pulmonary/Chest: Effort normal and breath sounds without  rales or wheezing.  Neurological: Pt is alert. At baseline orientation, motor grossly intact Skin: Skin is warm. No rashes, other new lesions, no LE edema Psychiatric: Pt behavior is normal without agitation  No other exam findings Lab Results  Component Value Date   WBC 6.8 12/07/2017   HGB 12.8 (L) 12/07/2017   HCT 37.5 (L) 12/07/2017   PLT 230.0 12/07/2017   GLUCOSE 102 (H) 06/01/2018   CHOL 140 06/01/2018   TRIG 87.0 06/01/2018   HDL 61.00 06/01/2018   LDLDIRECT 117.9 01/14/2008   LDLCALC 61 06/01/2018   ALT 17 06/01/2018   AST 25 06/01/2018   NA 143 06/01/2018   K 3.4 (L) 06/01/2018   CL 103 06/01/2018   CREATININE 1.42 06/01/2018   BUN 26 (H) 06/01/2018   CO2 28 06/01/2018   TSH 0.82 12/07/2017   PSA 0.39 12/07/2017   INR 1.09 08/05/2010   HGBA1C 5.8 06/01/2018  MICROALBUR 3.8 (H) 12/07/2017        Assessment & Plan:

## 2018-06-11 ENCOUNTER — Ambulatory Visit: Payer: 59 | Admitting: Internal Medicine

## 2018-06-30 ENCOUNTER — Encounter: Payer: Self-pay | Admitting: Podiatry

## 2018-06-30 ENCOUNTER — Other Ambulatory Visit: Payer: Self-pay

## 2018-06-30 ENCOUNTER — Ambulatory Visit: Payer: 59 | Admitting: Podiatry

## 2018-06-30 ENCOUNTER — Ambulatory Visit (INDEPENDENT_AMBULATORY_CARE_PROVIDER_SITE_OTHER): Payer: 59

## 2018-06-30 VITALS — Temp 97.7°F

## 2018-06-30 DIAGNOSIS — M205X9 Other deformities of toe(s) (acquired), unspecified foot: Secondary | ICD-10-CM

## 2018-06-30 DIAGNOSIS — M779 Enthesopathy, unspecified: Secondary | ICD-10-CM | POA: Diagnosis not present

## 2018-06-30 DIAGNOSIS — M7751 Other enthesopathy of right foot: Secondary | ICD-10-CM

## 2018-06-30 DIAGNOSIS — M778 Other enthesopathies, not elsewhere classified: Secondary | ICD-10-CM

## 2018-06-30 DIAGNOSIS — M2020 Hallux rigidus, unspecified foot: Secondary | ICD-10-CM | POA: Diagnosis not present

## 2018-06-30 MED ORDER — OXAPROZIN 600 MG PO TABS: 600.0000 mg | ORAL_TABLET | Freq: Two times a day (BID) | ORAL | 1 refills | Status: DC

## 2018-06-30 NOTE — Progress Notes (Signed)
This patient presents the office with chief complaint of a painful big toe joint right foot.  Patient states that he has started a new job which has increased walking at work.  He says yesterday he developed a aching red swollen big toe joint.  He says that he is walking gingerly on his right foot.  He has no history of injury or trauma to his foot.  He has provided no self treatment nor sought any professional help.  Patient was seen by myself in 2019 and diagnosed as having a hallux limitus first MPJ bilaterally.  Patient is diabetic.  He presents the office today for an evaluation and treatment of his painful right foot.    General Appearance  Alert, conversant and in no acute stress.  Vascular  Dorsalis pedis and posterior tibial  pulses are palpable  bilaterally.  Capillary return is within normal limits  bilaterally. Temperature is within normal limits  bilaterally.  Neurologic  Senn-Weinstein monofilament wire test within normal limits  bilaterally. Muscle power within normal limits bilaterally.  Nails Thick disfigured discolored nails with subungual debris  from hallux to fifth toes bilaterally. No evidence of bacterial infection or drainage bilaterally.  Orthopedic  No limitations of motion of motion feet .  No crepitus or effusions noted.  No bony pathology or digital deformities noted.Hallux limitus 1st MPJ  B/L.  Red swollen inflamed big toe joint right foot.  This inflammation  is localized to the big toe joint right foot.  Skin  normotropic skin with no porokeratosis noted bilaterally.  No signs of infections or ulcers noted.    Capsulitis 1st MPJ  Right foot  Hallux limitus 1st MPJ  B/L  ROV.  X-ray reveals a dorsal exostosis of the first metatarsal right foot.  Narrowing noted of the joint space first MPJ right foot.  Discussed this condition with this patient.  Told patient he has developed a capsulitis due to his increased walking and activity.  He also could be having a mild gout  attack.  Told him to wear thick soles shoes, rest his foot and prescribed  Daypro.  Patient to follow up next week.   Gardiner Barefoot DPM

## 2018-07-07 ENCOUNTER — Ambulatory Visit: Payer: 59 | Admitting: Podiatry

## 2018-07-07 ENCOUNTER — Other Ambulatory Visit: Payer: Self-pay

## 2018-07-07 VITALS — Temp 97.5°F

## 2018-07-07 DIAGNOSIS — M205X1 Other deformities of toe(s) (acquired), right foot: Secondary | ICD-10-CM | POA: Diagnosis not present

## 2018-07-07 DIAGNOSIS — M7751 Other enthesopathy of right foot: Secondary | ICD-10-CM

## 2018-07-07 NOTE — Progress Notes (Signed)
   HPI: 58 year old male presents the office today for follow-up evaluation regarding capsulitis to the right foot.  Patient states that he is feeling much better.  He was prescribed Daypro 600mg  BID last visit on 06/30/2018 and this helped to alleviate his pain and symptoms.  He is feeling much better.  He also went out and purchased some better shoes with a stiff sole to alleviate pressure from the first MTPJ.  No new complaints at this time  Past Medical History:  Diagnosis Date  . Chest pain 02/13/2012   echo - EF >55%;mod concentric L ventricular hypertrophy; atrial septum aneurysmal; patent foramen ovale suspected on color flow, LA mil/mod dilation; RV systolic pressure [id number]  . Chest pain 06/09/2007   echo - EF 45-50%; interarterial shunt; mild valvular regurgitation;   . Chest pain 06/09/2007   Myoview - EF 53%; normal perfusion all regions;EKG negative for ischemia   . Diabetes mellitus    type II  . DIABETES MELLITUS, TYPE II 08/29/2009  . GERD (gastroesophageal reflux disease) 08/12/2010  . Hemorrhoids   . History of anal fissures   . Hyperlipidemia   . HYPERLIPIDEMIA 08/29/2009  . Hypertension   . HYPERTENSION 01/14/2008  . Lightheadedness 05/10/2002   30d monitor unremarkable  . LVH (left ventricular hypertrophy)    with systolic dysfunction by echo 10/03, EF 45-50% - Dr. Melvern Banker  . Multinodular goiter 12/06/2015  . Nonspecific ST-T wave electrocardiographic changes 08/11/2006   Myoview - EF 49%; normal perfusion all regions, EKG negative for ischemia  . OSA (obstructive sleep apnea)   . Preventative health care 08/11/2010  . Sleep apnea 08/13/2008   AHI 1.2; sleep study 10/2007 - AHI 9.47at 15cm H2O and 31.3 at 16cm H2O  . SLEEP APNEA, OBSTRUCTIVE 01/14/2008     Physical Exam: General: The patient is alert and oriented x3 in no acute distress.  Dermatology: Skin is warm, dry and supple bilateral lower extremities. Negative for open lesions or macerations.  Vascular: Palpable  pedal pulses bilaterally. No edema or erythema noted. Capillary refill within normal limits.  Neurological: Epicritic and protective threshold grossly intact bilaterally.   Musculoskeletal Exam: Range of motion within normal limits to all pedal and ankle joints bilateral. Muscle strength 5/5 in all groups bilateral.   Assessment: 1.  First MTPJ capsulitis-resolved   Plan of Care:  1. Patient evaluated. 2.  Continue Daypro 600mg  prn 3.  Continue wearing good supportive shoes 4.  Patient may return to work full duty no restrictions beginning 07/15/2018 5.  Return to clinic as needed  *Works for [employer] is Patient [name], DPM [facility]  Dr. Edrick Kins, DPM    [address], [place] [address]                Office [phone]  Fax [phone]

## 2018-07-18 ENCOUNTER — Other Ambulatory Visit: Payer: Self-pay | Admitting: Internal Medicine

## 2018-07-24 ENCOUNTER — Other Ambulatory Visit: Payer: Self-pay | Admitting: Internal Medicine

## 2018-08-16 ENCOUNTER — Other Ambulatory Visit: Payer: Self-pay | Admitting: Podiatry

## 2018-09-14 ENCOUNTER — Encounter: Payer: Self-pay | Admitting: Internal Medicine

## 2018-09-14 ENCOUNTER — Ambulatory Visit (INDEPENDENT_AMBULATORY_CARE_PROVIDER_SITE_OTHER): Payer: 59 | Admitting: Internal Medicine

## 2018-09-14 DIAGNOSIS — E0800 Diabetes mellitus due to underlying condition with hyperosmolarity without nonketotic hyperglycemic-hyperosmolar coma (NKHHC): Secondary | ICD-10-CM | POA: Diagnosis not present

## 2018-09-14 DIAGNOSIS — Z794 Long term (current) use of insulin: Secondary | ICD-10-CM

## 2018-09-14 DIAGNOSIS — M7021 Olecranon bursitis, right elbow: Secondary | ICD-10-CM

## 2018-09-14 DIAGNOSIS — I10 Essential (primary) hypertension: Secondary | ICD-10-CM

## 2018-09-14 DIAGNOSIS — M7031 Other bursitis of elbow, right elbow: Secondary | ICD-10-CM | POA: Insufficient documentation

## 2018-09-14 MED ORDER — PREDNISONE 10 MG PO TABS: ORAL_TABLET | ORAL | 0 refills | Status: DC

## 2018-09-14 MED ORDER — OXYCODONE HCL 5 MG PO CAPS: 5.0000 mg | ORAL_CAPSULE | Freq: Four times a day (QID) | ORAL | 0 refills | Status: DC | PRN

## 2018-09-14 NOTE — Progress Notes (Signed)
Patient ID: [name], male   DOB: [birth year], 59 y.o.   MRN: [id number]  Virtual Visit via Video Note  I connected with [name] on 09/14/18 at  7:00 PM EDT by a video enabled telemedicine application and verified that I am speaking with the correct person using two identifiers.  Location: Patient: at home Provider: at home   I discussed the limitations of evaluation and management by telemedicine and the availability of in person appointments. The patient expressed understanding and agreed to proceed.  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 Went to medical , given brace but not really helping, pain now 8/10.  Not better with home leftover oxaprozen.  Pain overall constant, worse to move the arm at all, better with ice.   Pt denies fever, wt loss, night sweats, loss of appetite, or other constitutional symptoms,  No chills.  No hx of gout  Pt denies chest pain, increased sob or doe, wheezing, orthopnea, PND, increased LE swelling, palpitations, dizziness or syncope.  Pt denies new neurological symptoms such as new headache, or facial or extremity weakness or numbness   Pt denies polydipsia, polyuria  Past Medical History:  Diagnosis Date  . Chest pain 02/13/2012   echo - EF >55%;mod concentric L ventricular hypertrophy; atrial septum aneurysmal; patent foramen ovale suspected on color flow, LA mil/mod dilation; RV systolic pressure [id number]  . Chest pain 06/09/2007   echo - EF 45-50%; interarterial shunt; mild valvular regurgitation;   . Chest pain 06/09/2007   Myoview - EF 53%; normal perfusion all regions;EKG negative for ischemia   . Diabetes mellitus    type II  . DIABETES MELLITUS, TYPE II 08/29/2009  . GERD (gastroesophageal reflux  disease) 08/12/2010  . Hemorrhoids   . History of anal fissures   . Hyperlipidemia   . HYPERLIPIDEMIA 08/29/2009  . Hypertension   . HYPERTENSION 01/14/2008  . Lightheadedness 05/10/2002   30d monitor unremarkable  . LVH (left ventricular hypertrophy)    with systolic dysfunction by echo 10/03, EF 45-50% - Dr. Melvern Banker  . Multinodular goiter 12/06/2015  . Nonspecific ST-T wave electrocardiographic changes 08/11/2006   Myoview - EF 49%; normal perfusion all regions, EKG negative for ischemia  . OSA (obstructive sleep apnea)   . Preventative health care 08/11/2010  . Sleep apnea 08/13/2008   AHI 1.2; sleep study 10/2007 - AHI 9.47at 15cm H2O and 31.3 at 16cm H2O  . SLEEP APNEA, OBSTRUCTIVE 01/14/2008   Past Surgical History:  Procedure Laterality Date  . ANAL SPHINCTEROTOMY  08/20/11  . CARDIAC CATHETERIZATION  10/23/2008   normal coronary arteries and LV function  . COLONOSCOPY    . POLYPECTOMY      reports that he has been smoking cigarettes. He has been smoking about 0.50 packs per day. He has never used smokeless tobacco. He reports current alcohol use of about 3.0 standard drinks of alcohol per week. He reports that he does not use drugs. family history includes Heart disease in his father; Hypertension in his mother. Allergies  Allergen Reactions  . Sulfa Antibiotics Hives and Swelling  . Vicodin [Hydrocodone-Acetaminophen] Itching   Current Outpatient Medications on File Prior to Visit  Medication Sig Dispense Refill  . albuterol (PROVENTIL HFA;VENTOLIN HFA) 108 (90  Base) MCG/ACT inhaler Inhale 2 puffs into the lungs every 6 (six) hours as needed for wheezing or shortness of breath. 1 Inhaler 2  . amLODipine-benazepril (LOTREL) 10-40 MG capsule TAKE 1 CAPSULE BY MOUTH EVERY DAY 90 capsule 3  . aspirin 81 MG EC tablet Take 81 mg by mouth daily.      . Blood Glucose Monitoring Suppl (ONE TOUCH ULTRA 2) w/Device KIT Use as directed  E11.9 1 each 0  . BYSTOLIC 10 MG tablet TAKE 2 TABLETS  (20 MG TOTAL) BY MOUTH 2 (TWO) TIMES DAILY. 360 tablet 1  . chlorthalidone (HYGROTON) 25 MG tablet TAKE 1 TABLET BY MOUTH EVERY DAY IN THE MORNING 90 tablet 2  . Docusate Calcium ([employer] STOOL SOFTENER PO) Take 2 tablets by mouth 2 (two) times daily.     Marland Kitchen glucose blood (ONE TOUCH ULTRA TEST) test strip TEST THREE TIMES A DAY AS INSTRUCTED. 100 each 2  . hydrALAZINE (APRESOLINE) 50 MG tablet TAKE 1 TABLET BY MOUTH THREE TIMES A DAY 270 tablet 2  . ibuprofen (ADVIL,MOTRIN) 200 MG tablet Take 400 mg by mouth every 6 (six) hours as needed. Reported on 06/01/2015    . JANUVIA 100 MG tablet TAKE 1 TABLET BY MOUTH EVERY DAY 90 tablet 1  . Lancets MISC Use as directed E11.9 100 each 11  . metFORMIN (GLUCOPHAGE-XR) 500 MG 24 hr tablet Take 2 tablets (1,000 mg total) by mouth daily with breakfast. 180 tablet 3  . omeprazole (PRILOSEC) 40 MG capsule Take 1 capsule (40 mg total) by mouth daily. 90 capsule 3  . oxaprozin (DAYPRO) 600 MG tablet TAKE 1 TABLET BY MOUTH TWICE A DAY 60 tablet 1  . potassium chloride (K-DUR) 10 MEQ tablet TAKE 2 TABLETS (20 MEQ TOTAL) BY MOUTH DAILY. 180 tablet 0  . pravastatin (PRAVACHOL) 40 MG tablet TAKE 1 TABLET BY MOUTH EVERY DAY 90 tablet 1   No current facility-administered medications on file prior to visit.     Observations/Objective: Alert, NAD, appropriate mood and affect, resps normal, cn 2-12 intact, moves all 4s, right elbow olecranon tip with 2-3+ red, tender, swelling  Lab Results  Component Value Date   WBC 6.8 12/07/2017   HGB 12.8 (L) 12/07/2017   HCT 37.5 (L) 12/07/2017   PLT 230.0 12/07/2017   GLUCOSE 102 (H) 06/01/2018   CHOL 140 06/01/2018   TRIG 87.0 06/01/2018   HDL 61.00 06/01/2018   LDLDIRECT 117.9 01/14/2008   LDLCALC 61 06/01/2018   ALT 17 06/01/2018   AST 25 06/01/2018   NA 143 06/01/2018   K 3.4 (L) 06/01/2018   CL 103 06/01/2018   CREATININE 1.42 06/01/2018   BUN 26 (H) 06/01/2018   CO2 28 06/01/2018   TSH 0.82 12/07/2017   PSA 0.39  12/07/2017   INR 1.09 08/05/2010   HGBA1C 5.8 06/01/2018   MICROALBUR 3.8 (H) 12/07/2017   Assessment and Plan: See notes  Follow Up Instructions: Seen notes   I discussed the assessment and treatment plan with the patient. The patient was provided an opportunity to ask questions and all were answered. The patient agreed with the plan and demonstrated an understanding of the instructions.   The patient was advised to call back or seek an in-person evaluation if the symptoms worsen or if the condition fails to improve as anticipated   Cathlean Cower, MD

## 2018-09-14 NOTE — Assessment & Plan Note (Signed)
Encouraged to cont to monitor BP at home and next visit

## 2018-09-14 NOTE — Patient Instructions (Signed)
Please take all new medication as prescribed - the pain medication, and prednisone  Please continue all other medications as before, and refills have been done if requested.  Please have the pharmacy call with any other refills you may need.  Please continue your efforts at being more active, low cholesterol diet, and weight control  Please keep your appointments with your specialists as you may have planned  You are given the work note as well

## 2018-09-14 NOTE — Assessment & Plan Note (Signed)
stable overall by history and exam, recent data reviewed with pt, and pt to continue medical treatment as before,  to f/u any worsening symptoms or concerns  

## 2018-09-14 NOTE — Assessment & Plan Note (Signed)
Rather severe by pain and hx, most likely due to work activity; for pain control, predpac asd, consider f/u sport med, also work note for rest to Monday jun 22

## 2018-09-15 ENCOUNTER — Telehealth: Payer: Self-pay | Admitting: Internal Medicine

## 2018-09-15 DIAGNOSIS — Z0279 Encounter for issue of other medical certificate: Secondary | ICD-10-CM

## 2018-09-15 MED ORDER — OXYCODONE HCL 5 MG PO TABS: 5.0000 mg | ORAL_TABLET | Freq: Four times a day (QID) | ORAL | 0 refills | Status: DC | PRN

## 2018-09-15 NOTE — Telephone Encounter (Signed)
Patient has dropped off FMLA to completed for 6/16 to 6/21, returning to work on 09/20/18.   Forms have been complete & signed, Faxed to [employer] [phone], Copy sent to scan &charged for.   Patient informed and denied wanting a copy.

## 2018-10-12 ENCOUNTER — Other Ambulatory Visit: Payer: Self-pay | Admitting: Internal Medicine

## 2018-10-18 ENCOUNTER — Other Ambulatory Visit: Payer: Self-pay | Admitting: Internal Medicine

## 2018-11-12 ENCOUNTER — Other Ambulatory Visit: Payer: Self-pay | Admitting: Internal Medicine

## 2018-11-17 ENCOUNTER — Other Ambulatory Visit: Payer: Self-pay | Admitting: Internal Medicine

## 2018-12-16 ENCOUNTER — Other Ambulatory Visit: Payer: Self-pay

## 2018-12-20 ENCOUNTER — Other Ambulatory Visit: Payer: Self-pay

## 2018-12-20 ENCOUNTER — Encounter: Payer: Self-pay | Admitting: Endocrinology

## 2018-12-20 ENCOUNTER — Ambulatory Visit (INDEPENDENT_AMBULATORY_CARE_PROVIDER_SITE_OTHER): Payer: 59 | Admitting: Endocrinology

## 2018-12-20 VITALS — BP 104/70 | HR 82 | Ht 73.0 in | Wt 171.2 lb

## 2018-12-20 DIAGNOSIS — E042 Nontoxic multinodular goiter: Secondary | ICD-10-CM

## 2018-12-20 DIAGNOSIS — R1310 Dysphagia, unspecified: Secondary | ICD-10-CM

## 2018-12-20 NOTE — Progress Notes (Signed)
Subjective:    Patient ID: [name], male    DOB: 1961-03-22, 58 y.o.   MRN: [id number]  HPI Pt returns for f/u of multinodular goiter (dx'ed 2016; nuc med scan and US showed multinodular goiter, right lobe >> left; bx in 2016 showed beth cat 2; he has been euthyroid; he has never been on thyroid medication; f/u US is 2019 was unchanged).   reports few months of solid-only dysphagia at the ant neck, and assoc weight loss.   Past Medical History:  Diagnosis Date  . Chest pain 02/13/2012   echo - EF >55%;mod concentric L ventricular hypertrophy; atrial septum aneurysmal; patent foramen ovale suspected on color flow, LA mil/mod dilation; RV systolic pressure [id number]  . Chest pain 06/09/2007   echo - EF 45-50%; interarterial shunt; mild valvular regurgitation;   . Chest pain 06/09/2007   Myoview - EF 53%; normal perfusion all regions;EKG negative for ischemia   . Diabetes mellitus    type II  . DIABETES MELLITUS, TYPE II 08/29/2009  . GERD (gastroesophageal reflux disease) 08/12/2010  . Hemorrhoids   . History of anal fissures   . Hyperlipidemia   . HYPERLIPIDEMIA 08/29/2009  . Hypertension   . HYPERTENSION 01/14/2008  . Lightheadedness 05/10/2002   30d monitor unremarkable  . LVH (left ventricular hypertrophy)    with systolic dysfunction by echo 10/03, EF 45-50% - Dr. Melvern Banker  . Multinodular goiter 12/06/2015  . Nonspecific ST-T wave electrocardiographic changes 08/11/2006   Myoview - EF 49%; normal perfusion all regions, EKG negative for ischemia  . OSA (obstructive sleep apnea)   . Preventative health care 08/11/2010  . Sleep apnea 08/13/2008   AHI 1.2; sleep study 10/2007 - AHI 9.47at 15cm H2O and 31.3 at 16cm H2O  . SLEEP APNEA, OBSTRUCTIVE 01/14/2008    Past Surgical History:  Procedure Laterality Date  . ANAL SPHINCTEROTOMY  08/20/11  . CARDIAC CATHETERIZATION  10/23/2008   normal coronary arteries and LV function  . COLONOSCOPY    . POLYPECTOMY      Social History    Socioeconomic History  . Marital status: Single    Spouse name: Not on file  . Number of children: 1  . Years of education: Not on file  . Highest education level: Not on file  Occupational History  . Occupation: work as truck Education administrator: [employer]  . Financial resource strain: Not on file  . Food insecurity    Worry: Not on file    Inability: Not on file  . Transportation needs    Medical: Not on file    Non-medical: Not on file  Tobacco Use  . Smoking status: Current Every Day Smoker    Packs/day: 0.50    Types: Cigarettes  . Smokeless tobacco: Never Used  Substance and Sexual Activity  . Alcohol use: Yes    Alcohol/week: 3.0 standard drinks    Types: 3 Standard drinks or equivalent per week    Comment: occaisionally  . Drug use: No  . Sexual activity: Not on file  Lifestyle  . Physical activity    Days per week: Not on file    Minutes per session: Not on file  . Stress: Not on file  Relationships  . Social Herbalist on phone: Not on file    Gets together: Not on file    Attends religious service: Not on file    Active member of club or organization: Not on  file    Attends meetings of clubs or organizations: Not on file    Relationship status: Not on file  . Intimate partner violence    Fear of current or ex partner: Not on file    Emotionally abused: Not on file    Physically abused: Not on file    Forced sexual activity: Not on file  Other Topics Concern  . Not on file  Social History Narrative  . Not on file    Current Outpatient Medications on File Prior to Visit  Medication Sig Dispense Refill  . albuterol (PROVENTIL HFA;VENTOLIN HFA) 108 (90 Base) MCG/ACT inhaler Inhale 2 puffs into the lungs every 6 (six) hours as needed for wheezing or shortness of breath. 1 Inhaler 2  . amLODipine-benazepril (LOTREL) 10-40 MG capsule TAKE 1 CAPSULE BY MOUTH EVERY DAY 90 capsule 3  . aspirin 81 MG EC tablet Take 81 mg by  mouth daily.      . Blood Glucose Monitoring Suppl (ONE TOUCH ULTRA 2) w/Device KIT Use as directed  E11.9 1 each 0  . BYSTOLIC 10 MG tablet TAKE 2 TABLETS (20 MG TOTAL) BY MOUTH 2 (TWO) TIMES DAILY. 360 tablet 1  . chlorthalidone (HYGROTON) 25 MG tablet TAKE 1 TABLET BY MOUTH EVERY DAY IN THE MORNING 90 tablet 2  . Docusate Calcium ([employer] STOOL SOFTENER PO) Take 2 tablets by mouth 2 (two) times daily.     Marland Kitchen glucose blood (ONE TOUCH ULTRA TEST) test strip TEST THREE TIMES A DAY AS INSTRUCTED. 100 each 2  . hydrALAZINE (APRESOLINE) 50 MG tablet TAKE 1 TABLET BY MOUTH THREE TIMES A DAY 270 tablet 2  . ibuprofen (ADVIL,MOTRIN) 200 MG tablet Take 400 mg by mouth every 6 (six) hours as needed. Reported on 06/01/2015    . JANUVIA 100 MG tablet TAKE 1 TABLET BY MOUTH EVERY DAY 90 tablet 1  . Lancets MISC Use as directed E11.9 100 each 11  . metFORMIN (GLUCOPHAGE-XR) 500 MG 24 hr tablet Take 2 tablets (1,000 mg total) by mouth daily with breakfast. 180 tablet 3  . omeprazole (PRILOSEC) 40 MG capsule Take 1 capsule (40 mg total) by mouth daily. 90 capsule 3  . oxaprozin (DAYPRO) 600 MG tablet TAKE 1 TABLET BY MOUTH TWICE A DAY 60 tablet 1  . oxyCODONE (OXY IR/ROXICODONE) 5 MG immediate release tablet Take 1 tablet (5 mg total) by mouth every 6 (six) hours as needed for severe pain. 30 tablet 0  . potassium chloride (K-DUR) 10 MEQ tablet TAKE 2 TABLETS BY MOUTH DAILY 180 tablet 0  . pravastatin (PRAVACHOL) 40 MG tablet TAKE 1 TABLET BY MOUTH EVERY DAY 90 tablet 1  . predniSONE (DELTASONE) 10 MG tablet 3 tabs by mouth per day for 3 days,2tabs per day for 3 days,1tab per day for 3 days 18 tablet 0   No current facility-administered medications on file prior to visit.     Allergies  Allergen Reactions  . Sulfa Antibiotics Hives and Swelling  . Vicodin [Hydrocodone-Acetaminophen] Itching    Family History  Problem Relation Age of Onset  . Hypertension Mother   . Heart disease Father   . Colon cancer Neg  Hx   . Thyroid disease Neg Hx     BP 104/70 (BP Location: Left Arm, Patient Position: Sitting, Cuff Size: Normal)   Pulse 82   Ht _0  (1.854 m)   Wt 171 lb 3.2 oz (77.7 kg)   SpO2 97%   BMI 22.59 kg/m  Review of Systems Denies neck pain and hoarseness.      Objective:   Physical Exam VITAL SIGNS:  See vs page GENERAL: no distress NECK: There is no palpable thyroid enlargement.  No thyroid nodule is palpable.  No palpable lymphadenopathy at the anterior neck.      Assessment & Plan:  Dysphagia, new, uncertain etiology MNG: due for recheck  Patient Instructions  Blood tests are requested for you today.  We'll let you know about the results.  Let's recheck the ultrasound.  you will receive a phone call, about a day and time for an appointment. Please see an ear-nose-throat specialist.  you will receive a phone call, about a day and time for an appointment.  Please come back for a follow-up appointment in 1 year.

## 2018-12-20 NOTE — Patient Instructions (Addendum)
Blood tests are requested for you today.  We'll let you know about the results.  Let's recheck the ultrasound.  you will receive a phone call, about a day and time for an appointment. Please see an ear-nose-throat specialist.  you will receive a phone call, about a day and time for an appointment.  Please come back for a follow-up appointment in 1 year.

## 2018-12-21 LAB — TSH: TSH: 0.47 u[IU]/mL (ref 0.35–4.50)

## 2018-12-21 LAB — T4, FREE: Free T4: 1.06 ng/dL (ref 0.60–1.60)

## 2018-12-31 ENCOUNTER — Other Ambulatory Visit: Payer: Self-pay

## 2019-01-11 ENCOUNTER — Ambulatory Visit
Admission: RE | Admit: 2019-01-11 | Discharge: 2019-01-11 | Disposition: A | Discharge status: Home / Self Care | Payer: 59 | Source: Ambulatory Visit | Attending: Endocrinology | Admitting: Endocrinology

## 2019-01-11 DIAGNOSIS — E042 Nontoxic multinodular goiter: Secondary | ICD-10-CM

## 2019-01-11 IMAGING — US US THYROID
1 series · 13 of 25 positions shown · non-contrast
Comparison: [DATE]; [DATE]

CLINICAL DATA: Goiter.  Multinodular goiter.

EXAM:
THYROID ULTRASOUND
TECHNIQUE: Ultrasound examination of the thyroid gland and adjacent soft
tissues was performed.

[Series 1: us thyroid · 0.10mm/px · 13 of 41 slices shown]
[im 1/41]
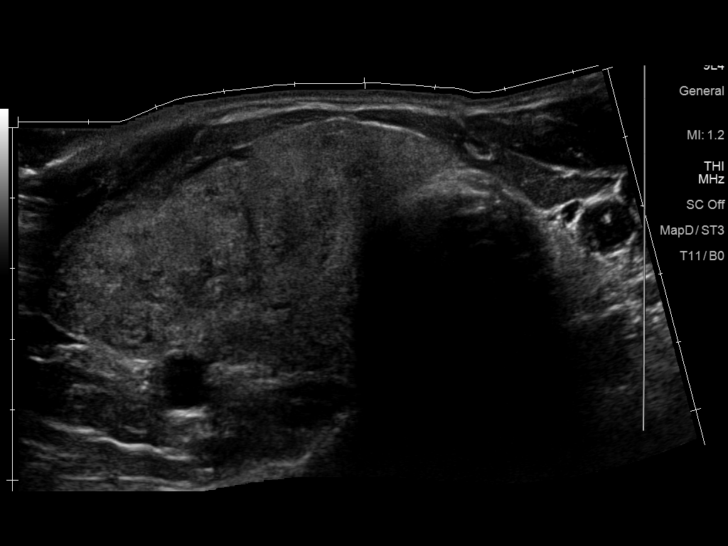
[im 4/41]
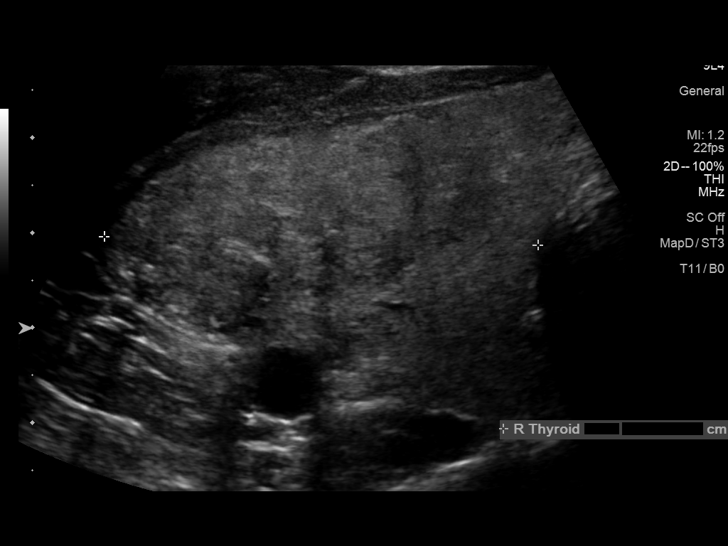
[im 7/41]
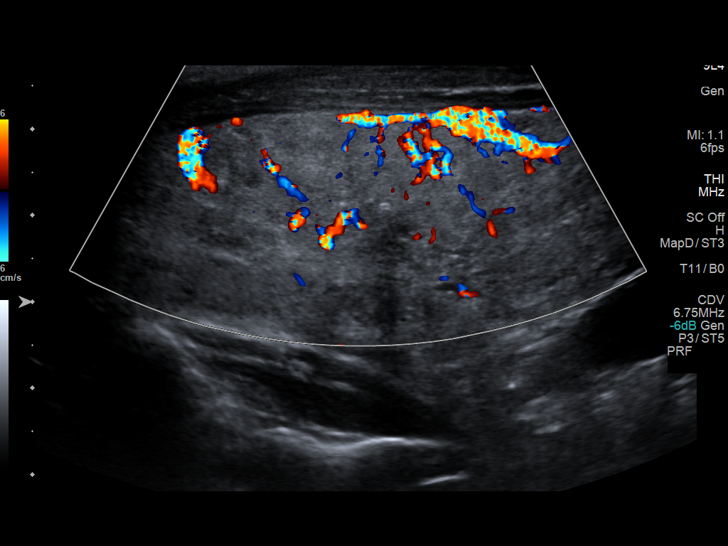
[im 11/41]
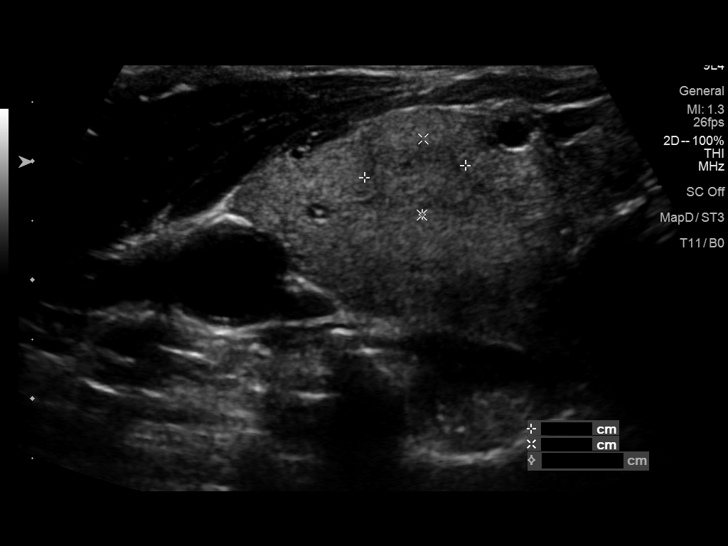
[im 14/41]
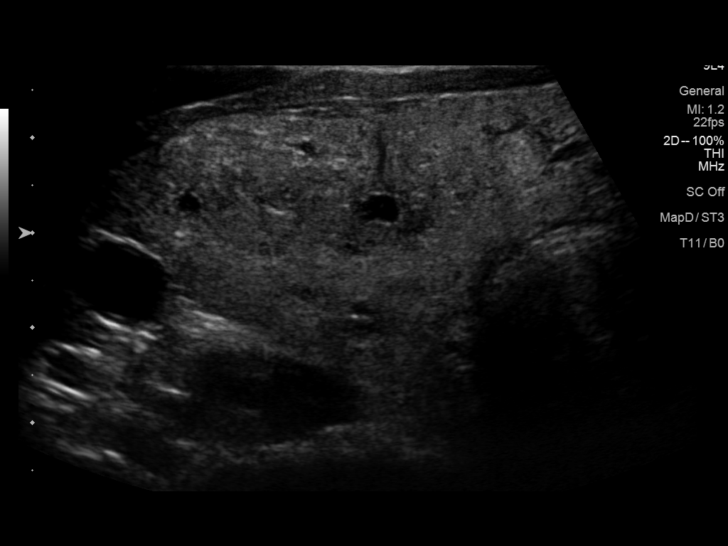
[im 17/41]
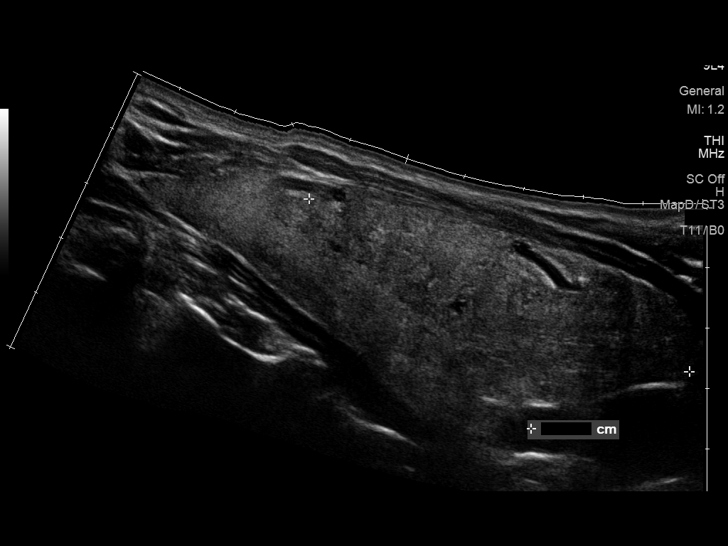
[im 21/41]
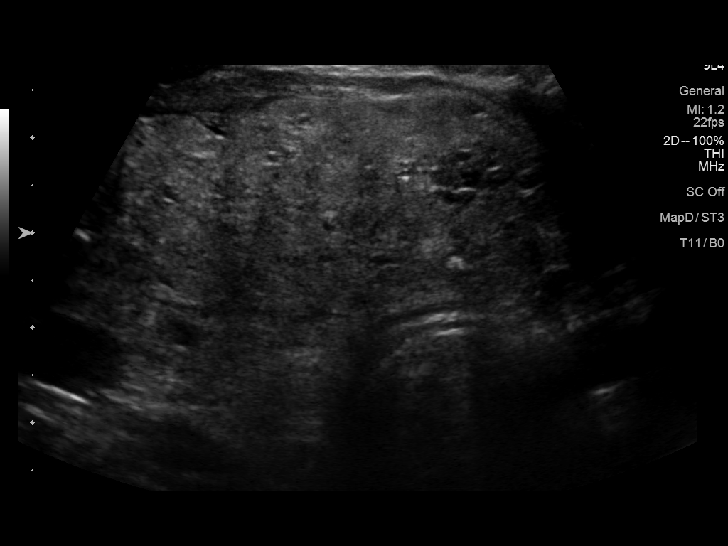
[im 24/41]
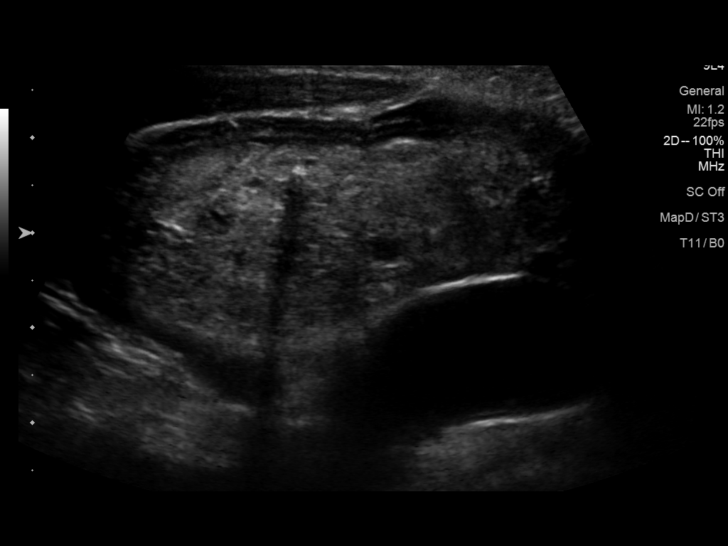
[im 27/41]
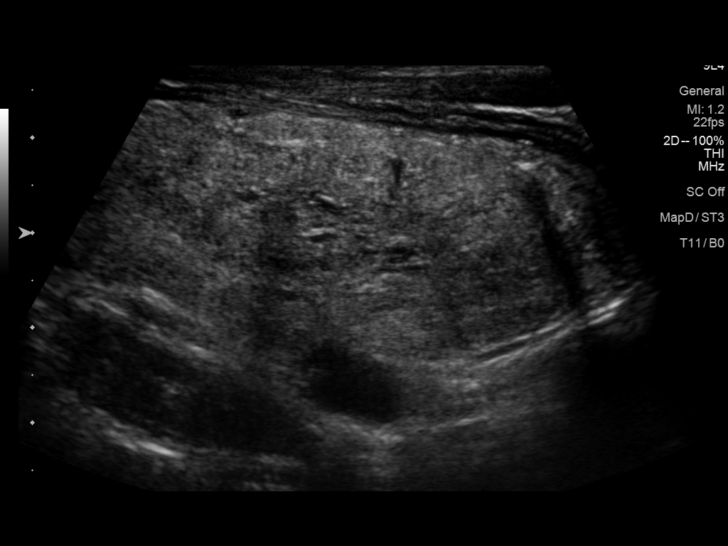
[im 31/41]
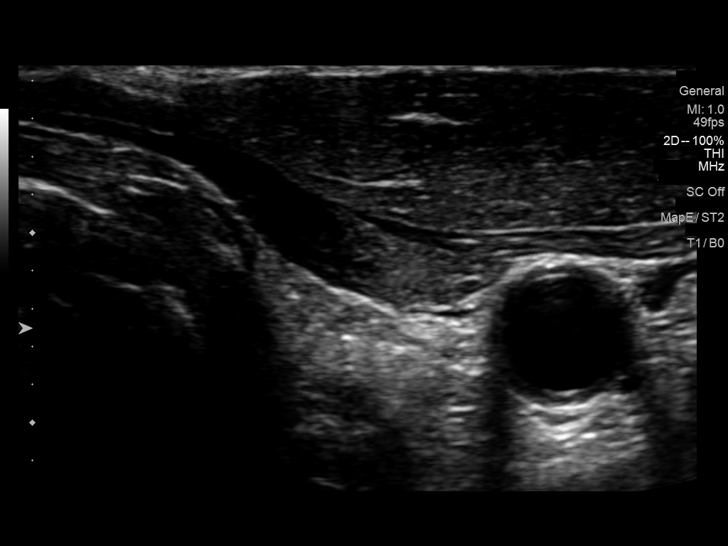
[im 34/41]
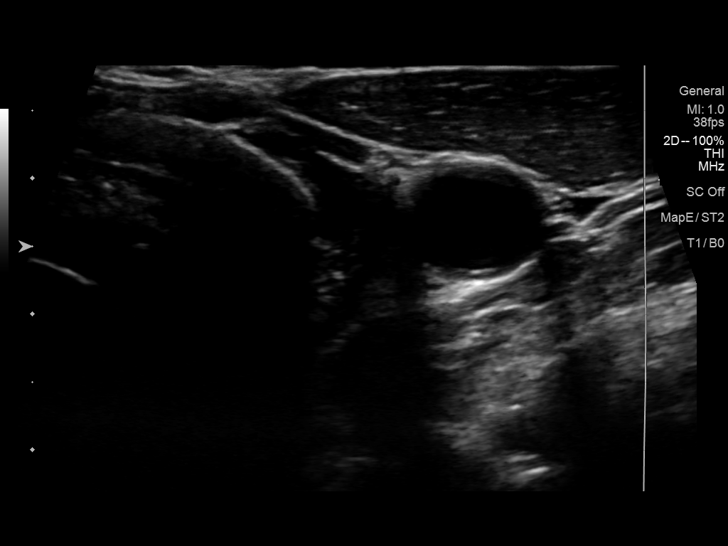
[im 37/41]
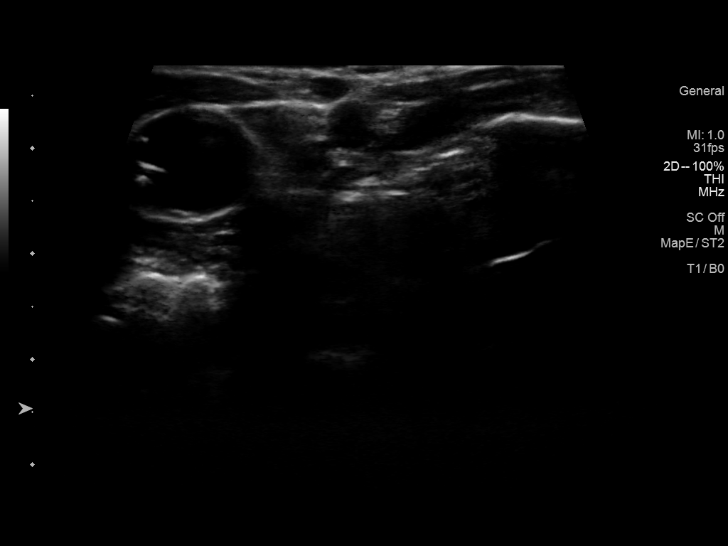
[im 41/41]
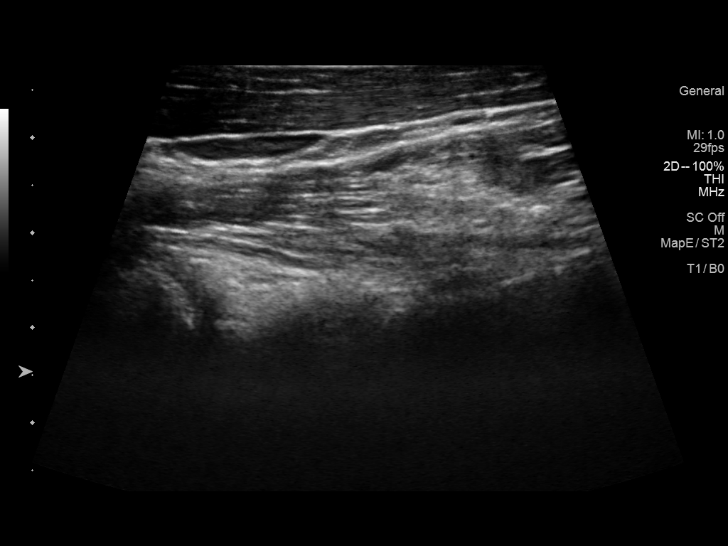

[13 of 25 positions shown; findings below may reference images not displayed]

FINDINGS: Parenchymal Echotexture: Moderately heterogenous

Isthmus: Normal in size measures 0.3 cm in diameter, unchanged

Right lobe: Enlarged measuring 9.1 x 2.8 x 4.6 cm, unchanged,
previously, 8.1 x 2.9 x 4.7 cm

Left lobe: Atrophic measuring 0.9 x 0.2 x 0.6 cm, unchanged,
previously, 1.3 x 0.4 x 1.6 cm

_________________________________________________________

Estimated total number of nodules >/= 1 cm: 1

Number of spongiform nodules >/=  2 cm not described below (TR1): 0

Number of mixed cystic and solid nodules >/= 1.5 cm not described
below (TR2): 0

_________________________________________________________

There is an approximately 0.9 x 0.8 x 0.6 cm isoechoic ill-defined
nodule/pseudonodule within the superior pole the right lobe of the
thyroid (labeled 1), which is unchanged compared to the [DATE]
examination, previously, 1.1 x 1.1 x 0.7 cm, and again does not meet
imaging criteria to recommend percutaneous sampling or continued
dedicated follow-up.

_________________________________________________________

Nodule # 2:

Prior biopsy: No

Location: Right; Mid

Maximum size: 7.0 cm; Other 2 dimensions: 5.8 x 2.6 cm, previously,
6.0 x 5.3 x 2.4 cm

Composition: solid/almost completely solid (2)

Echogenicity: isoechoic (1)

Shape: not taller-than-wide (0)

Margins: ill-defined (0)

Echogenic foci: macrocalcifications (1)

ACR TI-RADS total points: 5.

ACR TI-RADS risk category:  TR4 (4-6 points).

Significant change in size (>/= 20% in two dimensions and minimal
increase of 2 mm): Yes, though slight differences likely
attributable to scan plane projection and accentuated due to the
ill-defined borders of the nodule

Change in features: No

Change in ACR TI-RADS risk category: No

ACR TI-RADS recommendations:

**Given size (>/= 1.5 cm) and appearance, fine needle aspiration of
this moderately suspicious nodule should be considered based on
TI-RADS criteria.

_________________________________________________________
IMPRESSION: 1. Similar asymmetric atrophy of left lobe of the thyroid.
2. No worrisome new thyroid nodules.
3. The approximately 7.0 cm nodule/mass replacing the right lobe of
the thyroid has minimally increased in size compared to the [7K]
examination, previously, 6 cm, though slight differences are
potentially attributable to scan plane projection and accentuated
due to the ill-defined borders of nodule, however again this
nodule/mass meets imaging criteria to recommend percutaneous
sampling. Given otherwise relative stability since the [7K]
examination, an additional imaging strategy could entail a follow-up
indicative of benign etiology.

The above is in keeping with the ACR TI-RADS recommendations - [HOSPITAL] [7K];[DATE].

## 2019-01-16 ENCOUNTER — Other Ambulatory Visit: Payer: Self-pay | Admitting: Internal Medicine

## 2019-01-27 ENCOUNTER — Other Ambulatory Visit: Payer: Self-pay | Admitting: Internal Medicine

## 2019-01-28 ENCOUNTER — Ambulatory Visit: Payer: 59 | Admitting: Cardiovascular Disease

## 2019-01-29 ENCOUNTER — Other Ambulatory Visit: Payer: Self-pay | Admitting: Internal Medicine

## 2019-02-06 ENCOUNTER — Other Ambulatory Visit: Payer: Self-pay

## 2019-02-06 ENCOUNTER — Ambulatory Visit
Admission: EM | Admit: 2019-02-06 | Discharge: 2019-02-06 | Disposition: A | Discharge status: Home / Self Care | Payer: 59 | Attending: Emergency Medicine | Admitting: Emergency Medicine

## 2019-02-06 ENCOUNTER — Encounter: Payer: Self-pay | Admitting: Emergency Medicine

## 2019-02-06 DIAGNOSIS — S39012A Strain of muscle, fascia and tendon of lower back, initial encounter: Secondary | ICD-10-CM

## 2019-02-06 DIAGNOSIS — M545 Low back pain: Secondary | ICD-10-CM | POA: Diagnosis not present

## 2019-02-06 MED ORDER — NAPROXEN 500 MG PO TABS: 500.0000 mg | ORAL_TABLET | Freq: Two times a day (BID) | ORAL | 0 refills | Status: DC

## 2019-02-06 NOTE — Discharge Instructions (Addendum)
Recommend RICE: rest, ice, compression, elevation as needed for pain.    Heat therapy (hot compress, warm wash red, hot showers, etc.) can help relax muscles and soothe muscle aches. Cold therapy (ice packs) can be used to help swelling both after injury and after prolonged use of areas of chronic pain/aches.  For pain: recommend 350 mg-1000 mg of Tylenol (acetaminophen) as needed.  May alternate between the two throughout the day as they are generally safe to take together.  DO NOT exceed more than 3000 mg of Tylenol in a 24 hour period as this could damage your stomach, kidneys, liver, or increase your bleeding risk.   Return for worsening back pain, saddle area numbness, or change in bowel/bladder habit.

## 2019-02-06 NOTE — ED Triage Notes (Signed)
PT reports left lower back pain that started after work. Does a lot of lifting at work. Hasn't tried any meds.

## 2019-02-06 NOTE — ED Provider Notes (Signed)
[facility]    CSN: [id number] Arrival date & time: 02/06/19  0813      History   Chief Complaint Chief Complaint  Patient presents with   Back Pain    HPI [name] is a 58 y.o. male with history of diabetes, hypertension, goiter, OSA presenting for left low back pain that started after work yesterday evening.  Patient does a lot of bending, twisting, some heavy lifting.  Denies trauma to the area, fall.  No pop/snap/tearing sensation.  Patient denies radiation.  Tried heating pad last night with some relief, has not tried any OTC medications.  Denies saddle anesthesia, change in bowel or bladder habit.   Past Medical History:  Diagnosis Date   Chest pain 02/13/2012   echo - EF >55%;mod concentric L ventricular hypertrophy; atrial septum aneurysmal; patent foramen ovale suspected on color flow, LA mil/mod dilation; RV systolic pressure [id number]   Chest pain 06/09/2007   echo - EF 45-50%; interarterial shunt; mild valvular regurgitation;    Chest pain 06/09/2007   Myoview - EF 53%; normal perfusion all regions;EKG negative for ischemia    Diabetes mellitus    type II   DIABETES MELLITUS, TYPE II 08/29/2009   GERD (gastroesophageal reflux disease) 08/12/2010   Hemorrhoids    History of anal fissures    Hyperlipidemia    HYPERLIPIDEMIA 08/29/2009   Hypertension    HYPERTENSION 01/14/2008   Lightheadedness 05/10/2002   30d monitor unremarkable   LVH (left ventricular hypertrophy)    with systolic dysfunction by echo 10/03, EF 45-50% - Dr. Melvern Banker   Multinodular goiter 12/06/2015   Nonspecific ST-T wave electrocardiographic changes 08/11/2006   Myoview - EF 49%; normal perfusion all regions, EKG negative for ischemia   OSA (obstructive sleep apnea)    Preventative health care 08/11/2010   Sleep apnea 08/13/2008   AHI 1.2; sleep study 10/2007 - AHI 9.47at 15cm H2O and 31.3 at [place], OBSTRUCTIVE 01/14/2008    Patient Active  Problem List   Diagnosis Date Noted   Dysphagia 12/20/2018   Bursitis of right elbow 09/14/2018   Renal insufficiency 12/10/2017   Encounter for well adult exam with abnormal findings 12/10/2017   Multinodular goiter 12/06/2015   Loss of weight 11/21/2014   Tobacco abuse 08/16/2012   Anal fissure 06/19/2011   Hemorrhoids 06/12/2011   GERD (gastroesophageal reflux disease) 08/12/2010   Diabetes ([place]) 08/29/2009   Hyperlipidemia 08/29/2009   SLEEP APNEA, OBSTRUCTIVE 01/14/2008   Essential hypertension 01/14/2008    Past Surgical History:  Procedure Laterality Date   ANAL SPHINCTEROTOMY  08/20/11   CARDIAC CATHETERIZATION  10/23/2008   normal coronary arteries and LV function   COLONOSCOPY     POLYPECTOMY         Home Medications    Prior to Admission medications   Medication Sig Start Date End Date Taking? Authorizing Provider  amLODipine-benazepril (LOTREL) 10-40 MG capsule TAKE 1 CAPSULE BY MOUTH EVERY DAY 01/31/19  Yes Biagio Borg, MD  aspirin 81 MG EC tablet Take 81 mg by mouth daily.     Yes [provider]  BYSTOLIC 10 MG tablet TAKE 2 TABLETS (20 MG TOTAL) BY MOUTH 2 (TWO) TIMES DAILY. 01/17/19  Yes Biagio Borg, MD  chlorthalidone (HYGROTON) 25 MG tablet TAKE 1 TABLET BY MOUTH EVERY DAY IN THE MORNING 10/12/18  Yes Biagio Borg, MD  hydrALAZINE (APRESOLINE) 50 MG tablet TAKE 1 TABLET BY MOUTH THREE TIMES A DAY 10/12/18  Yes Biagio Borg, MD  JANUVIA 100 MG tablet TAKE 1 TABLET BY MOUTH EVERY DAY 11/12/18  Yes Biagio Borg, MD  metFORMIN (GLUCOPHAGE-XR) 500 MG 24 hr tablet Take 2 tablets (1,000 mg total) by mouth daily with breakfast. 06/03/18  Yes Biagio Borg, MD  omeprazole (PRILOSEC) 40 MG capsule TAKE 1 CAPSULE BY MOUTH EVERY DAY 01/27/19  Yes Biagio Borg, MD  oxaprozin (DAYPRO) 600 MG tablet TAKE 1 TABLET BY MOUTH TWICE A DAY 08/16/18  Yes Gardiner Barefoot, DPM  potassium chloride (K-DUR) 10 MEQ tablet TAKE 2 TABLETS BY MOUTH DAILY  10/18/18  Yes Biagio Borg, MD  pravastatin (PRAVACHOL) 40 MG tablet TAKE 1 TABLET BY MOUTH EVERY DAY 11/17/18  Yes Biagio Borg, MD  albuterol (PROVENTIL HFA;VENTOLIN HFA) 108 (90 Base) MCG/ACT inhaler Inhale 2 puffs into the lungs every 6 (six) hours as needed for wheezing or shortness of breath. 04/23/18   Biagio Borg, MD  Blood Glucose Monitoring Suppl (ONE TOUCH ULTRA 2) w/Device KIT Use as directed  E11.9 12/11/16   Biagio Borg, MD  Docusate Calcium ([employer] STOOL SOFTENER PO) Take 2 tablets by mouth 2 (two) times daily.     [provider]  glucose blood (ONE TOUCH ULTRA TEST) test strip TEST THREE TIMES A DAY AS INSTRUCTED. 02/23/18   Biagio Borg, MD  ibuprofen (ADVIL,MOTRIN) 200 MG tablet Take 400 mg by mouth every 6 (six) hours as needed. Reported on 06/01/2015    [provider]  Lancets MISC Use as directed E11.9 12/11/16   Biagio Borg, MD  naproxen (NAPROSYN) 500 MG tablet Take 1 tablet (500 mg total) by mouth 2 (two) times daily. 02/06/19   Hall-Potvin, [place], PA-C  oxyCODONE (OXY IR/ROXICODONE) 5 MG immediate release tablet Take 1 tablet (5 mg total) by mouth every 6 (six) hours as needed for severe pain. 09/15/18   Biagio Borg, MD  predniSONE (DELTASONE) 10 MG tablet 3 tabs by mouth per day for 3 days,2tabs per day for 3 days,1tab per day for 3 days 09/14/18   Biagio Borg, MD    Family History Family History  Problem Relation Age of Onset   Hypertension Mother    Heart disease Father    Colon cancer Neg Hx    Thyroid disease Neg Hx     Social History Social History   Tobacco Use   Smoking status: Current Every Day Smoker    Packs/day: 0.50    Types: Cigarettes   Smokeless tobacco: Never Used  Substance Use Topics   Alcohol use: Yes    Alcohol/week: 3.0 standard drinks    Types: 3 Standard drinks or equivalent per week    Comment: occaisionally   Drug use: No     Allergies   Sulfa antibiotics and Vicodin  [hydrocodone-acetaminophen]   Review of Systems Review of Systems  Constitutional: Negative for fatigue and fever.  Respiratory: Negative for cough and shortness of breath.   Cardiovascular: Negative for chest pain and palpitations.  Musculoskeletal:       Positive for left low back pain   Neurological: Negative for weakness and numbness.     Physical Exam Triage Vital Signs ED Triage Vitals  Enc Vitals Group     BP      Pulse      Resp      Temp      Temp src      SpO2      Weight  Height      Head Circumference      Peak Flow      Pain Score      Pain Loc      Pain Edu?      Excl. in [place]?    No data found.  Updated Vital Signs BP 126/79    Pulse 64    Temp 98.5 F (36.9 C) (Oral)    Resp 16    SpO2 98%   Visual Acuity Right Eye Distance:   Left Eye Distance:   Bilateral Distance:    Right Eye Near:   Left Eye Near:    Bilateral Near:     Physical Exam Constitutional:      General: He is not in acute distress. HENT:     Head: Normocephalic and atraumatic.  Eyes:     General: No scleral icterus.    Pupils: Pupils are equal, round, and reactive to light.  Cardiovascular:     Rate and Rhythm: Normal rate.  Pulmonary:     Effort: Pulmonary effort is normal. No respiratory distress.     Breath sounds: No wheezing.  Musculoskeletal:     Lumbar back: He exhibits tenderness. He exhibits normal range of motion, no bony tenderness, no swelling, no edema, no deformity and no spasm.       Back:  Skin:    General: Skin is warm.     Coloration: Skin is not jaundiced.     Findings: No bruising.  Neurological:     General: No focal deficit present.     Mental Status: He is alert.     Sensory: No sensory deficit.     Deep Tendon Reflexes: Reflexes normal.      UC Treatments / Results  Labs (all labs ordered are listed, but only abnormal results are displayed) Labs Reviewed - No data to display  EKG   Radiology No results  found.  Procedures Procedures (including critical care time)  Medications Ordered in UC Medications - No data to display  Initial Impression / Assessment and Plan / UC Course  I have reviewed the triage vital signs and the nursing notes.  Pertinent labs & imaging results that were available during my care of the patient were reviewed by me and considered in my medical decision making (see chart for details).     H&P consistent with lumbar strain.  Will give patient off from work today so he can practice RICE, lighter duty for the next week.  Will give Naprosyn given patient's adequate renal function is reviewed by me at time of appointment: See March 2020 labs.  Low back rehab exercises provided.  Return precautions discussed, patient verbalized understanding and is agreeable to plan. Final Clinical Impressions(s) / UC Diagnoses   Final diagnoses:  Strain of lumbar region, initial encounter     Discharge Instructions     Recommend RICE: rest, ice, compression, elevation as needed for pain.    Heat therapy (hot compress, warm wash red, hot showers, etc.) can help relax muscles and soothe muscle aches. Cold therapy (ice packs) can be used to help swelling both after injury and after prolonged use of areas of chronic pain/aches.  For pain: recommend 350 mg-1000 mg of Tylenol (acetaminophen) as needed.  May alternate between the two throughout the day as they are generally safe to take together.  DO NOT exceed more than 3000 mg of Tylenol in a 24 hour period as this could damage your stomach, kidneys, liver,  or increase your bleeding risk.   Return for worsening back pain, saddle area numbness, or change in bowel/bladder habit.    ED Prescriptions    Medication Sig Dispense Auth. Provider   naproxen (NAPROSYN) 500 MG tablet Take 1 tablet (500 mg total) by mouth 2 (two) times daily. 30 tablet Hall-Potvin, [place], PA-C     I have reviewed the PDMP during this encounter.    Neldon Mc Nectar, [place] 02/06/19 [phone]

## 2019-03-15 ENCOUNTER — Other Ambulatory Visit: Payer: Self-pay | Admitting: Internal Medicine

## 2019-03-31 ENCOUNTER — Encounter

## 2019-04-06 ENCOUNTER — Encounter: Payer: Self-pay | Admitting: Internal Medicine

## 2019-04-26 ENCOUNTER — Other Ambulatory Visit: Payer: Self-pay | Admitting: Internal Medicine

## 2019-04-26 NOTE — Telephone Encounter (Signed)
Please refill as per office routine med refill policy (all routine meds refilled for 3 mo or monthly per pt preference up to one year from last visit, then month to month grace period for 3 mo, then further med refills will have to be denied)  

## 2019-05-22 ENCOUNTER — Other Ambulatory Visit: Payer: Self-pay | Admitting: Internal Medicine

## 2019-05-22 NOTE — Telephone Encounter (Signed)
Please refill as per office routine med refill policy (all routine meds refilled for 3 mo or monthly per pt preference up to one year from last visit, then month to month grace period for 3 mo, then further med refills will have to be denied)  

## 2019-06-16 ENCOUNTER — Encounter: Payer: Self-pay | Admitting: General Practice

## 2019-06-21 ENCOUNTER — Other Ambulatory Visit: Payer: Self-pay | Admitting: Internal Medicine

## 2019-07-01 ENCOUNTER — Other Ambulatory Visit: Payer: Self-pay | Admitting: Internal Medicine

## 2019-07-01 NOTE — Telephone Encounter (Signed)
Please refill as per office routine med refill policy (all routine meds refilled for 3 mo or monthly per pt preference up to one year from last visit, then month to month grace period for 3 mo, then further med refills will have to be denied)  

## 2019-07-05 ENCOUNTER — Other Ambulatory Visit: Payer: Self-pay | Admitting: Internal Medicine

## 2019-07-05 NOTE — Telephone Encounter (Signed)
Please refill as per office routine med refill policy (all routine meds refilled for 3 mo or monthly per pt preference up to one year from last visit, then month to month grace period for 3 mo, then further med refills will have to be denied)  

## 2019-07-12 ENCOUNTER — Other Ambulatory Visit: Payer: Self-pay | Admitting: Internal Medicine

## 2019-07-13 ENCOUNTER — Other Ambulatory Visit: Payer: Self-pay | Admitting: Internal Medicine

## 2019-07-13 ENCOUNTER — Encounter: Payer: Self-pay | Admitting: Internal Medicine

## 2019-07-13 NOTE — Telephone Encounter (Signed)
Please refill as per office routine med refill policy (all routine meds refilled for 3 mo or monthly per pt preference up to one year from last visit, then month to month grace period for 3 mo, then further med refills will have to be denied)  

## 2019-07-14 ENCOUNTER — Other Ambulatory Visit: Payer: Self-pay | Admitting: Internal Medicine

## 2019-07-14 ENCOUNTER — Other Ambulatory Visit: Payer: Self-pay

## 2019-07-14 MED ORDER — SITAGLIPTIN PHOSPHATE 100 MG PO TABS: 100.0000 mg | ORAL_TABLET | Freq: Every day | ORAL | 0 refills | Status: DC

## 2019-07-19 ENCOUNTER — Other Ambulatory Visit: Payer: Self-pay | Admitting: Internal Medicine

## 2019-07-19 NOTE — Telephone Encounter (Signed)
Please refill as per office routine med refill policy (all routine meds refilled for 3 mo or monthly per pt preference up to one year from last visit, then month to month grace period for 3 mo, then further med refills will have to be denied)  

## 2019-07-20 ENCOUNTER — Other Ambulatory Visit: Payer: Self-pay | Admitting: Internal Medicine

## 2019-07-20 NOTE — Telephone Encounter (Signed)
Please refill as per office routine med refill policy (all routine meds refilled for 3 mo or monthly per pt preference up to one year from last visit, then month to month grace period for 3 mo, then further med refills will have to be denied)  

## 2019-07-27 ENCOUNTER — Other Ambulatory Visit: Payer: Self-pay | Admitting: Internal Medicine

## 2019-07-31 ENCOUNTER — Other Ambulatory Visit: Payer: Self-pay

## 2019-07-31 ENCOUNTER — Ambulatory Visit
Admission: EM | Admit: 2019-07-31 | Discharge: 2019-07-31 | Disposition: A | Discharge status: Home / Self Care | Payer: 59 | Attending: Physician Assistant | Admitting: Physician Assistant

## 2019-07-31 DIAGNOSIS — M25562 Pain in left knee: Secondary | ICD-10-CM

## 2019-07-31 MED ORDER — NAPROXEN 500 MG PO TABS: 500.0000 mg | ORAL_TABLET | Freq: Two times a day (BID) | ORAL | 0 refills | Status: DC

## 2019-07-31 NOTE — ED Provider Notes (Signed)
[facility]    CSN: [id number] Arrival date & time: 07/31/19  0803      History   Chief Complaint Chief Complaint  Patient presents with  . Knee Pain    left side    HPI [name] is a 59 y.o. male.   59 year old male comes in for 4 day history of atraumatic left knee pain. Anterior knee pain that is not present at rest. Pain exacerbated by ROM and weight bearing. Denies radiation of pain, numbness/tingling. Ibuprofen 438m Q4-6H with temporary relief.      Past Medical History:  Diagnosis Date  . Chest pain 02/13/2012   echo - EF >55%;mod concentric L ventricular hypertrophy; atrial septum aneurysmal; patent foramen ovale suspected on color flow, LA mil/mod dilation; RV systolic pressure [id number] . Chest pain 06/09/2007   echo - EF 45-50%; interarterial shunt; mild valvular regurgitation;   . Chest pain 06/09/2007   Myoview - EF 53%; normal perfusion all regions;EKG negative for ischemia   . Diabetes mellitus    type II  . DIABETES MELLITUS, TYPE II 08/29/2009  . GERD (gastroesophageal reflux disease) 08/12/2010  . Hemorrhoids   . History of anal fissures   . Hyperlipidemia   . HYPERLIPIDEMIA 08/29/2009  . Hypertension   . HYPERTENSION 01/14/2008  . Lightheadedness 05/10/2002   30d monitor unremarkable  . LVH (left ventricular hypertrophy)    with systolic dysfunction by echo 10/03, EF 45-50% - Dr. GMelvern Banker . Multinodular goiter 12/06/2015  . Nonspecific ST-T wave electrocardiographic changes 08/11/2006   Myoview - EF 49%; normal perfusion all regions, EKG negative for ischemia  . OSA (obstructive sleep apnea)   . Preventative health care 08/11/2010  . Sleep apnea 08/13/2008   AHI 1.2; sleep study 10/2007 - AHI 9.47at 15cm H2O and 31.3 at 16cm H2O  . SLEEP APNEA, OBSTRUCTIVE 01/14/2008    Patient Active Problem List   Diagnosis Date Noted  . Dysphagia 12/20/2018  . Bursitis of right elbow 09/14/2018  . Renal insufficiency 12/10/2017  . Encounter for  well adult exam with abnormal findings 12/10/2017  . Multinodular goiter 12/06/2015  . Loss of weight 11/21/2014  . Tobacco abuse 08/16/2012  . Anal fissure 06/19/2011  . Hemorrhoids 06/12/2011  . GERD (gastroesophageal reflux disease) 08/12/2010  . [place] 08/29/2009  . Hyperlipidemia 08/29/2009  . SLEEP APNEA, OBSTRUCTIVE 01/14/2008  . Essential hypertension 01/14/2008    Past Surgical History:  Procedure Laterality Date  . ANAL SPHINCTEROTOMY  08/20/11  . CARDIAC CATHETERIZATION  10/23/2008   normal coronary arteries and LV function  . COLONOSCOPY    . POLYPECTOMY         Home Medications    Prior to Admission medications   Medication Sig Start Date End Date Taking? Authorizing Provider  pravastatin (PRAVACHOL) 40 MG tablet Take 1 tablet (40 mg total) by mouth daily. Overdue for Annual appt must see provider for future refills 07/13/19   JBiagio Borg MD  albuterol (VENTOLIN HFA) 108 (90 Base) MCG/ACT inhaler Inhale 2 puffs into the lungs every 6 (six) hours as needed for wheezing or shortness of breath. Annual appt is due must see provider for future refills 06/21/19   JBiagio Borg MD  amLODipine-benazepril (LOTREL) 10-40 MG capsule TAKE 1 CAPSULE BY MOUTH EVERY DAY 04/26/19   JBiagio Borg MD  aspirin 81 MG EC tablet Take 81 mg by mouth daily.      [provider]  Blood Glucose Monitoring Suppl (  ONE TOUCH ULTRA 2) w/Device KIT Use as directed  E11.9 12/11/16   Biagio Borg, MD  chlorthalidone (HYGROTON) 25 MG tablet Take 1 tablet (25 mg total) by mouth daily. Must keep scheduled appt in June for future refills 07/27/19   Biagio Borg, MD  Docusate Calcium ([employer] STOOL SOFTENER PO) Take 2 tablets by mouth 2 (two) times daily.     [provider]  glucose blood (ONE TOUCH ULTRA TEST) test strip TEST THREE TIMES A DAY AS INSTRUCTED. 02/23/18   Biagio Borg, MD  hydrALAZINE (APRESOLINE) 50 MG tablet Take 1 tablet (50 mg total) by mouth 3 (three) times  daily. Overdue for Annual must see provider for future refills 07/05/19   Biagio Borg, MD  ibuprofen (ADVIL,MOTRIN) 200 MG tablet Take 400 mg by mouth every 6 (six) hours as needed. Reported on 06/01/2015    [provider]  Lancets MISC Use as directed E11.9 12/11/16   Biagio Borg, MD  metFORMIN (GLUCOPHAGE-XR) 500 MG 24 hr tablet TAKE 2 TABLETS BY MOUTH EVERY DAY WITH BREAKFAST 05/23/19   Biagio Borg, MD  naproxen (NAPROSYN) 500 MG tablet Take 1 tablet (500 mg total) by mouth 2 (two) times daily. 07/31/19   Tasia Catchings, [place] V, PA-C  nebivolol (BYSTOLIC) 10 MG tablet Take 2 tablets (20 mg total) by mouth daily. Must keep scheduled appt for future refills 07/20/19   Biagio Borg, MD  omeprazole (PRILOSEC) 40 MG capsule Take 1 capsule (40 mg total) by mouth daily. OVERDUE for Annual appt must see provider for future refills 07/19/19   Biagio Borg, MD  oxaprozin (DAYPRO) 600 MG tablet TAKE 1 TABLET BY MOUTH TWICE A DAY 08/16/18   Gardiner Barefoot, DPM  potassium chloride (KLOR-CON M10) 10 MEQ tablet Take 2 tablets (20 mEq total) by mouth daily. Must keep scheduled appt in June for future refills 07/27/19   Biagio Borg, MD  sitaGLIPtin (JANUVIA) 100 MG tablet Take 1 tablet (100 mg total) by mouth daily. 07/14/19   Biagio Borg, MD    Family History Family History  Problem Relation Age of Onset  . Hypertension Mother   . Heart disease Father   . Colon cancer Neg Hx   . Thyroid disease Neg Hx     Social History Social History   Tobacco Use  . Smoking status: Current Every Day Smoker    Packs/day: 0.50    Types: Cigarettes  . Smokeless tobacco: Never Used  Substance Use Topics  . Alcohol use: Yes    Alcohol/week: 3.0 standard drinks    Types: 3 Standard drinks or equivalent per week    Comment: occaisionally  . Drug use: No     Allergies   Sulfa antibiotics and Vicodin [hydrocodone-acetaminophen]   Review of Systems Review of Systems  Reason unable to perform ROS: See HPI as above.      Physical Exam Triage Vital Signs ED Triage Vitals  Enc Vitals Group     BP 07/31/19 0813 116/75     Pulse Rate 07/31/19 0813 61     Resp 07/31/19 0813 14     Temp 07/31/19 0813 98.3 F (36.8 C)     Temp Source 07/31/19 0813 Oral     SpO2 07/31/19 0813 97 %     Weight --      Height --      Head Circumference --      Peak Flow --  Pain Score 07/31/19 0814 6     Pain Loc --      Pain Edu? --      Excl. in [place]? --    No data found.  Updated Vital Signs BP 116/75 (BP Location: Left Arm)   Pulse 61   Temp 98.3 F (36.8 C) (Oral)   Resp 14   SpO2 97%   Physical Exam Constitutional:      General: He is not in acute distress.    Appearance: Normal appearance. He is well-developed. He is not toxic-appearing or diaphoretic.  HENT:     Head: Normocephalic and atraumatic.  Eyes:     Conjunctiva/sclera: Conjunctivae normal.     Pupils: Pupils are equal, round, and reactive to light.  Pulmonary:     Effort: Pulmonary effort is normal. No respiratory distress.     Comments: Speaking in full sentences without difficulty Musculoskeletal:     Cervical back: Normal range of motion and neck supple.     Comments: Swelling superior to patellar. No erythema, but warm to touch. Tenderness to palpation of superior knee and patellar. Full ROM of knee. Strength 5/5 BLE. Sensation intact.  Skin:    General: Skin is warm and dry.  Neurological:     Mental Status: He is alert and oriented to person, place, and time.      UC Treatments / Results  Labs (all labs ordered are listed, but only abnormal results are displayed) Labs Reviewed - No data to display  EKG   Radiology No results found.  Procedures Procedures (including critical care time)  Medications Ordered in UC Medications - No data to display  Initial Impression / Assessment and Plan / UC Course  I have reviewed the triage vital signs and the nursing notes.  Pertinent labs & imaging results that were  available during my care of the patient were reviewed by me and considered in my medical decision making (see chart for details).    Discussed left knee pain most likely inflammatory in nature. NSAIDs, ice compress, rest, knee sleeve during activity. Return precautions given.  Final Clinical Impressions(s) / UC Diagnoses   Final diagnoses:  Acute pain of left knee    ED Prescriptions    Medication Sig Dispense Auth. Provider   naproxen (NAPROSYN) 500 MG tablet Take 1 tablet (500 mg total) by mouth 2 (two) times daily. 20 tablet Ok Edwards, PA-C     I have reviewed the PDMP during this encounter.   Ok Edwards, PA-C 07/31/19 2204

## 2019-07-31 NOTE — ED Triage Notes (Signed)
Patient presents for evaluation of left knee that started on Thursday.  He denies injury. Ibuprofen does ease the pain for a short while.

## 2019-07-31 NOTE — Discharge Instructions (Addendum)
Inflammatory in nature, can be due to gout, tendinitis, bursitis, arthritis. Start naproxen as directed. Ice compress, elevation, knee brace during activity. Follow up with PCP/orthopedics if symptoms not improving. If having worsening swelling, redness, pain, and unable to bend/extend your knee at all, go to the emergency department for further evaluation needed.

## 2019-08-01 ENCOUNTER — Encounter: Payer: Self-pay | Admitting: Internal Medicine

## 2019-08-02 ENCOUNTER — Ambulatory Visit: Payer: 59 | Admitting: Internal Medicine

## 2019-08-02 ENCOUNTER — Other Ambulatory Visit: Payer: Self-pay

## 2019-08-02 ENCOUNTER — Encounter: Payer: Self-pay | Admitting: Internal Medicine

## 2019-08-02 VITALS — BP 122/80 | HR 66 | Temp 98.1°F | Ht 73.0 in | Wt 173.0 lb

## 2019-08-02 DIAGNOSIS — Z125 Encounter for screening for malignant neoplasm of prostate: Secondary | ICD-10-CM | POA: Diagnosis not present

## 2019-08-02 DIAGNOSIS — E7800 Pure hypercholesterolemia, unspecified: Secondary | ICD-10-CM

## 2019-08-02 DIAGNOSIS — E119 Type 2 diabetes mellitus without complications: Secondary | ICD-10-CM | POA: Diagnosis not present

## 2019-08-02 DIAGNOSIS — M109 Gout, unspecified: Secondary | ICD-10-CM

## 2019-08-02 DIAGNOSIS — I10 Essential (primary) hypertension: Secondary | ICD-10-CM | POA: Diagnosis not present

## 2019-08-02 DIAGNOSIS — E538 Deficiency of other specified B group vitamins: Secondary | ICD-10-CM | POA: Diagnosis not present

## 2019-08-02 DIAGNOSIS — Z0001 Encounter for general adult medical examination with abnormal findings: Secondary | ICD-10-CM | POA: Diagnosis not present

## 2019-08-02 DIAGNOSIS — Z794 Long term (current) use of insulin: Secondary | ICD-10-CM

## 2019-08-02 DIAGNOSIS — E0800 Diabetes mellitus due to underlying condition with hyperosmolarity without nonketotic hyperglycemic-hyperosmolar coma (NKHHC): Secondary | ICD-10-CM

## 2019-08-02 DIAGNOSIS — E559 Vitamin D deficiency, unspecified: Secondary | ICD-10-CM | POA: Diagnosis not present

## 2019-08-02 LAB — CBC WITH DIFFERENTIAL/PLATELET
Basophils Absolute: 0 10*3/uL (ref 0.0–0.1)
Basophils Relative: 0.6 % (ref 0.0–3.0)
Eosinophils Absolute: 0.2 10*3/uL (ref 0.0–0.7)
Eosinophils Relative: 2.1 % (ref 0.0–5.0)
HCT: 37.3 % — ABNORMAL LOW (ref 39.0–52.0)
Hemoglobin: 12.6 g/dL — ABNORMAL LOW (ref 13.0–17.0)
Lymphocytes Relative: 27.5 % (ref 12.0–46.0)
Lymphs Abs: 2.1 10*3/uL (ref 0.7–4.0)
MCHC: 33.9 g/dL (ref 30.0–36.0)
MCV: 98.1 fl (ref 78.0–100.0)
Monocytes Absolute: 1 10*3/uL (ref 0.1–1.0)
Monocytes Relative: 13.2 % — ABNORMAL HIGH (ref 3.0–12.0)
Neutro Abs: 4.2 10*3/uL (ref 1.4–7.7)
Neutrophils Relative %: 56.6 % (ref 43.0–77.0)
Platelets: 251 10*3/uL (ref 150.0–400.0)
RBC: 3.8 Mil/uL — ABNORMAL LOW (ref 4.22–5.81)
RDW: 13.2 % (ref 11.5–15.5)
WBC: 7.5 10*3/uL (ref 4.0–10.5)

## 2019-08-02 LAB — MICROALBUMIN / CREATININE URINE RATIO
Creatinine,U: 525.8 mg/dL
Microalb Creat Ratio: 3.9 mg/g (ref 0.0–30.0)
Microalb, Ur: 20.7 mg/dL — ABNORMAL HIGH (ref 0.0–1.9)

## 2019-08-02 LAB — BASIC METABOLIC PANEL
BUN: 25 mg/dL — ABNORMAL HIGH (ref 6–23)
CO2: 31 mEq/L (ref 19–32)
Calcium: 9.8 mg/dL (ref 8.4–10.5)
Chloride: 100 mEq/L (ref 96–112)
Creatinine, Ser: 1.81 mg/dL — ABNORMAL HIGH (ref 0.40–1.50)
GFR: 46.47 mL/min — ABNORMAL LOW (ref >60.00)
Glucose, Bld: 99 mg/dL (ref 70–99)
Potassium: 3.3 mEq/L — ABNORMAL LOW (ref 3.5–5.1)
Sodium: 139 mEq/L (ref 135–145)

## 2019-08-02 LAB — LIPID PANEL
Cholesterol: 155 mg/dL (ref 0–200)
HDL: 53.3 mg/dL (ref >39.00)
LDL Cholesterol: 82 mg/dL (ref 0–99)
NonHDL: 102.04
Total CHOL/HDL Ratio: 3
Triglycerides: 101 mg/dL (ref 0.0–149.0)
VLDL: 20.2 mg/dL (ref 0.0–40.0)

## 2019-08-02 LAB — HEPATIC FUNCTION PANEL
ALT: 9 U/L (ref 0–53)
AST: 15 U/L (ref 0–37)
Albumin: 4.4 g/dL (ref 3.5–5.2)
Alkaline Phosphatase: 66 U/L (ref 39–117)
Bilirubin, Direct: 0.1 mg/dL (ref 0.0–0.3)
Total Bilirubin: 0.6 mg/dL (ref 0.2–1.2)
Total Protein: 7 g/dL (ref 6.0–8.3)

## 2019-08-02 LAB — TSH: TSH: 1.03 u[IU]/mL (ref 0.35–4.50)

## 2019-08-02 LAB — VITAMIN B12: Vitamin B-12: 260 pg/mL (ref 211–911)

## 2019-08-02 LAB — PSA: PSA: 0.4 ng/mL (ref 0.10–4.00)

## 2019-08-02 LAB — HEMOGLOBIN A1C: Hgb A1c MFr Bld: 5.9 % (ref 4.6–6.5)

## 2019-08-02 LAB — VITAMIN D 25 HYDROXY (VIT D DEFICIENCY, FRACTURES): VITD: 45.16 ng/mL (ref 30.00–100.00)

## 2019-08-02 LAB — URIC ACID: Uric Acid, Serum: 9.1 mg/dL — ABNORMAL HIGH (ref 4.0–7.8)

## 2019-08-02 MED ORDER — METHYLPREDNISOLONE ACETATE 80 MG/ML IJ SUSP
80.0000 mg | Freq: Once | INTRAMUSCULAR | Status: AC
  Administered 2019-08-02: 80 mg via INTRAMUSCULAR

## 2019-08-02 MED ORDER — PREDNISONE 10 MG PO TABS: ORAL_TABLET | ORAL | 0 refills | Status: DC

## 2019-08-02 NOTE — Telephone Encounter (Signed)
Ok for ROV, thanks 

## 2019-08-02 NOTE — Patient Instructions (Addendum)
You had the steroid shot today  Please take all new medication as prescribed - the prednisone  You are given the work note today  Please continue all other medications as before, and refills have been done if requested.  Please have the pharmacy call with any other refills you may need.  Please continue your efforts at being more active, low cholesterol diet, and weight control.  You are otherwise up to date with prevention measures today.  Please keep your appointments with your specialists as you may have planned  Please go to the LAB at the blood drawing area for the tests to be done  You will be contacted by phone if any changes need to be made immediately.  Otherwise, you will receive a letter about your results with an explanation, but please check with MyChart first.  Please remember to sign up for MyChart if you have not done so, as this will be important to you in the future with finding out test results, communicating by private email, and scheduling acute appointments online when needed.  Please make an Appointment to return in 6 months, or sooner if needed, also with Lab Appointment for testing done 3-5 days before at the [facility] (so this is for TWO appointments - please see the scheduling desk as you leave)   OK TO CANCEL the June Appointment

## 2019-08-02 NOTE — Progress Notes (Signed)
Subjective:    Patient ID: [name], male    DOB: 1960-11-11, 59 y.o.   MRN: [id number]  HPI  Here for wellness and f/u;  Overall doing ok;  Pt denies Chest pain, worsening SOB, DOE, wheezing, orthopnea, PND, worsening LE edema, palpitations, dizziness or syncope.  Pt denies neurological change such as new headache, facial or extremity weakness.  Pt denies polydipsia, polyuria, or low sugar symptoms. Pt states overall good compliance with treatment and medications, good tolerability, and has been trying to follow appropriate diet.  Pt denies worsening depressive symptoms, suicidal ideation or panic. No fever, night sweats, wt loss, loss of appetite, or other constitutional symptoms.  Pt states good ability with ADL's, has low fall risk, home safety reviewed and adequate, no other significant changes in hearing or vision, and only occasionally active with exercise.   Also with c/o left knee pain pain very severe to start sudden 1 wk ago, seen at [facility] and some improved with naproxyn but still limps to walk, though no giveaways, falls, trauma, fever.  No prior known hx of gout though had also suspicious pain swelling to base of right thumb last wk resolved on its own. Past Medical History:  Diagnosis Date  . Chest pain 02/13/2012   echo - EF >55%;mod concentric L ventricular hypertrophy; atrial septum aneurysmal; patent foramen ovale suspected on color flow, LA mil/mod dilation; RV systolic pressure [id number]  . Chest pain 06/09/2007   echo - EF 45-50%; interarterial shunt; mild valvular regurgitation;   . Chest pain 06/09/2007   Myoview - EF 53%; normal perfusion all regions;EKG negative for ischemia   . Diabetes mellitus    type II  . DIABETES MELLITUS, TYPE II 08/29/2009  . GERD (gastroesophageal reflux disease) 08/12/2010  . Hemorrhoids   . History of anal fissures   . Hyperlipidemia   . HYPERLIPIDEMIA 08/29/2009  . Hypertension   . HYPERTENSION 01/14/2008  . Lightheadedness 05/10/2002   30d  monitor unremarkable  . LVH (left ventricular hypertrophy)    with systolic dysfunction by echo 10/03, EF 45-50% - Dr. Melvern Banker  . Multinodular goiter 12/06/2015  . Nonspecific ST-T wave electrocardiographic changes 08/11/2006   Myoview - EF 49%; normal perfusion all regions, EKG negative for ischemia  . OSA (obstructive sleep apnea)   . Preventative health care 08/11/2010  . Sleep apnea 08/13/2008   AHI 1.2; sleep study 10/2007 - AHI 9.47at 15cm H2O and 31.3 at 16cm H2O  . SLEEP APNEA, OBSTRUCTIVE 01/14/2008   Past Surgical History:  Procedure Laterality Date  . ANAL SPHINCTEROTOMY  08/20/11  . CARDIAC CATHETERIZATION  10/23/2008   normal coronary arteries and LV function  . COLONOSCOPY    . POLYPECTOMY      reports that he has been smoking cigarettes. He has been smoking about 0.50 packs per day. He has never used smokeless tobacco. He reports current alcohol use of about 3.0 standard drinks of alcohol per week. He reports that he does not use drugs. family history includes Heart disease in his father; Hypertension in his mother. Allergies  Allergen Reactions  . Sulfa Antibiotics Hives and Swelling  . Vicodin [Hydrocodone-Acetaminophen] Itching   Current Outpatient Medications on File Prior to Visit  Medication Sig Dispense Refill  . albuterol (VENTOLIN HFA) 108 (90 Base) MCG/ACT inhaler Inhale 2 puffs into the lungs every 6 (six) hours as needed for wheezing or shortness of breath. Annual appt is due must see provider for future refills 6.7 g 0  . aspirin  81 MG EC tablet Take 81 mg by mouth daily.      . Blood Glucose Monitoring Suppl (ONE TOUCH ULTRA 2) w/Device KIT Use as directed  E11.9 1 each 0  . Docusate Calcium ([facility] STOOL SOFTENER PO) Take 2 tablets by mouth 2 (two) times daily.     Marland Kitchen glucose blood (ONE TOUCH ULTRA TEST) test strip TEST THREE TIMES A DAY AS INSTRUCTED. 100 each 2  . hydrALAZINE (APRESOLINE) 50 MG tablet Take 1 tablet (50 mg total) by mouth 3 (three) times daily.  Overdue for Annual must see provider for future refills 90 tablet 0  . ibuprofen (ADVIL,MOTRIN) 200 MG tablet Take 400 mg by mouth every 6 (six) hours as needed. Reported on 06/01/2015    . Lancets MISC Use as directed E11.9 100 each 11  . metFORMIN (GLUCOPHAGE-XR) 500 MG 24 hr tablet TAKE 2 TABLETS BY MOUTH EVERY DAY WITH BREAKFAST 180 tablet 0  . naproxen (NAPROSYN) 500 MG tablet Take 1 tablet (500 mg total) by mouth 2 (two) times daily. 20 tablet 0  . nebivolol (BYSTOLIC) 10 MG tablet Take 2 tablets (20 mg total) by mouth daily. Must keep scheduled appt for future refills 60 tablet 1  . omeprazole (PRILOSEC) 40 MG capsule Take 1 capsule (40 mg total) by mouth daily. OVERDUE for Annual appt must see provider for future refills 30 capsule 0  . oxaprozin (DAYPRO) 600 MG tablet TAKE 1 TABLET BY MOUTH TWICE A DAY 60 tablet 1  . potassium chloride (KLOR-CON M10) 10 MEQ tablet Take 2 tablets (20 mEq total) by mouth daily. Must keep scheduled appt in June for future refills 60 tablet 1  . pravastatin (PRAVACHOL) 40 MG tablet Take 1 tablet (40 mg total) by mouth daily. Overdue for Annual appt must see provider for future refills 30 tablet 0  . sitaGLIPtin (JANUVIA) 100 MG tablet Take 1 tablet (100 mg total) by mouth daily. 90 tablet 0   No current facility-administered medications on file prior to visit.   Review of Systems All otherwise neg per pt     Objective:   Physical Exam BP 122/80 (BP Location: Right Arm, Patient Position: Sitting, Cuff Size: Large)   Pulse 66   Temp 98.1 F (36.7 C) (Oral)   Ht _0  (1.854 m)   Wt 173 lb (78.5 kg)   SpO2 97%   BMI 22.82 kg/m  VS noted,  Constitutional: Pt appears in NAD HENT: Head: NCAT.  Right Ear: External ear normal.  Left Ear: External ear normal.  Eyes: . Pupils are equal, round, and reactive to light. Conjunctivae and EOM are normal Nose: without d/c or deformity Neck: Neck supple. Gross normal ROM Cardiovascular: Normal rate and regular  rhythm.   Pulmonary/Chest: Effort normal and breath sounds without rales or wheezing.  Abd:  Soft, NT, ND, + BS, no organomegaly Neurological: Pt is alert. At baseline orientation, motor grossly intact Skin: Skin is warm. No rashes, other new lesions, no LE edema Psychiatric: Pt behavior is normal without agitation  All otherwise neg per pt Lab Results  Component Value Date   WBC 7.5 08/02/2019   HGB 12.6 (L) 08/02/2019   HCT 37.3 (L) 08/02/2019   PLT 251.0 08/02/2019   GLUCOSE 99 08/02/2019   CHOL 155 08/02/2019   TRIG 101.0 08/02/2019   HDL 53.30 08/02/2019   LDLDIRECT 117.9 01/14/2008   LDLCALC 82 08/02/2019   ALT 9 08/02/2019   AST 15 08/02/2019   NA 139 08/02/2019  K 3.3 (L) 08/02/2019   CL 100 08/02/2019   CREATININE 1.81 (H) 08/02/2019   BUN 25 (H) 08/02/2019   CO2 31 08/02/2019   TSH 1.03 08/02/2019   PSA 0.40 08/02/2019   INR 1.09 08/05/2010   HGBA1C 5.9 08/02/2019   MICROALBUR 20.7 (H) 08/02/2019      Assessment & Plan:

## 2019-08-03 ENCOUNTER — Encounter: Payer: Self-pay | Admitting: Internal Medicine

## 2019-08-03 ENCOUNTER — Other Ambulatory Visit: Payer: Self-pay | Admitting: Internal Medicine

## 2019-08-03 DIAGNOSIS — E876 Hypokalemia: Secondary | ICD-10-CM

## 2019-08-03 LAB — URINALYSIS, ROUTINE W REFLEX MICROSCOPIC
Hgb urine dipstick: NEGATIVE
Leukocytes,Ua: NEGATIVE
Nitrite: NEGATIVE
Specific Gravity, Urine: 1.025 (ref 1.000–1.030)
Total Protein, Urine: 30 — AB
Urine Glucose: NEGATIVE
Urobilinogen, UA: 0.2 (ref 0.0–1.0)
pH: 5 (ref 5.0–8.0)

## 2019-08-03 MED ORDER — HYDROCHLOROTHIAZIDE 25 MG PO TABS: 25.0000 mg | ORAL_TABLET | Freq: Every day | ORAL | 3 refills | Status: DC

## 2019-08-03 NOTE — Telephone Encounter (Signed)
Please refill as per office routine med refill policy (all routine meds refilled for 3 mo or monthly per pt preference up to one year from last visit, then month to month grace period for 3 mo, then further med refills will have to be denied)  

## 2019-08-03 NOTE — Assessment & Plan Note (Signed)

## 2019-08-03 NOTE — Assessment & Plan Note (Addendum)
Left knee high suspicion, will tx empirically with predpac asd, off work note given, to check uric acid, consider allopurinol if recurring  I spent 31 minutes in addition to time for CPX wellness examination in preparing to see the patient by review of recent labs, imaging and procedures, obtaining and reviewing separately obtained history, communicating with the patient and family or caregiver, ordering medications, tests or procedures, and documenting clinical information in the EHR including the differential Dx, treatment, and any further evaluation and other management of acute gouty arthritis, DM, HTN, HLD

## 2019-08-03 NOTE — Assessment & Plan Note (Signed)
stable overall by history and exam, recent data reviewed with pt, and pt to continue medical treatment as before,  to f/u any worsening symptoms or concerns, for f/u labs 

## 2019-08-03 NOTE — Assessment & Plan Note (Signed)
stable overall by history and exam, recent data reviewed with pt, and pt to continue medical treatment as before,  to f/u any worsening symptoms or concerns  

## 2019-08-04 ENCOUNTER — Other Ambulatory Visit: Payer: Self-pay | Admitting: Internal Medicine

## 2019-08-04 NOTE — Telephone Encounter (Signed)
Please refill as per office routine med refill policy (all routine meds refilled for 3 mo or monthly per pt preference up to one year from last visit, then month to month grace period for 3 mo, then further med refills will have to be denied)  

## 2019-08-10 ENCOUNTER — Other Ambulatory Visit: Payer: Self-pay | Admitting: Internal Medicine

## 2019-08-14 ENCOUNTER — Other Ambulatory Visit: Payer: Self-pay | Admitting: Internal Medicine

## 2019-08-14 NOTE — Telephone Encounter (Signed)
Please refill as per office routine med refill policy (all routine meds refilled for 3 mo or monthly per pt preference up to one year from last visit, then month to month grace period for 3 mo, then further med refills will have to be denied)  

## 2019-08-21 ENCOUNTER — Other Ambulatory Visit: Payer: Self-pay | Admitting: Internal Medicine

## 2019-08-22 NOTE — Telephone Encounter (Signed)
Please refill as per office routine med refill policy (all routine meds refilled for 3 mo or monthly per pt preference up to one year from last visit, then month to month grace period for 3 mo, then further med refills will have to be denied)  

## 2019-08-26 ENCOUNTER — Other Ambulatory Visit: Payer: Self-pay | Admitting: Internal Medicine

## 2019-08-26 ENCOUNTER — Other Ambulatory Visit (INDEPENDENT_AMBULATORY_CARE_PROVIDER_SITE_OTHER): Payer: 59

## 2019-08-26 DIAGNOSIS — E876 Hypokalemia: Secondary | ICD-10-CM

## 2019-08-26 LAB — BASIC METABOLIC PANEL
BUN: 23 mg/dL (ref 6–23)
CO2: 31 mEq/L (ref 19–32)
Calcium: 9.4 mg/dL (ref 8.4–10.5)
Chloride: 101 mEq/L (ref 96–112)
Creatinine, Ser: 1.32 mg/dL (ref 0.40–1.50)
GFR: 66.88 mL/min (ref >60.00)
Glucose, Bld: 98 mg/dL (ref 70–99)
Potassium: 3.2 mEq/L — ABNORMAL LOW (ref 3.5–5.1)
Sodium: 141 mEq/L (ref 135–145)

## 2019-08-29 ENCOUNTER — Other Ambulatory Visit: Payer: Self-pay | Admitting: Internal Medicine

## 2019-08-29 NOTE — Telephone Encounter (Signed)
Please refill as per office routine med refill policy (all routine meds refilled for 3 mo or monthly per pt preference up to one year from last visit, then month to month grace period for 3 mo, then further med refills will have to be denied)  

## 2019-09-05 ENCOUNTER — Ambulatory Visit: Payer: 59 | Admitting: Internal Medicine

## 2019-09-06 ENCOUNTER — Ambulatory Visit: Payer: 59 | Admitting: Cardiovascular Disease

## 2019-09-06 ENCOUNTER — Encounter: Payer: Self-pay | Admitting: Cardiovascular Disease

## 2019-09-06 ENCOUNTER — Other Ambulatory Visit: Payer: Self-pay

## 2019-09-06 ENCOUNTER — Other Ambulatory Visit: Payer: Self-pay | Admitting: Internal Medicine

## 2019-09-06 DIAGNOSIS — I10 Essential (primary) hypertension: Secondary | ICD-10-CM

## 2019-09-06 DIAGNOSIS — E782 Mixed hyperlipidemia: Secondary | ICD-10-CM

## 2019-09-06 DIAGNOSIS — Z720 Tobacco use: Secondary | ICD-10-CM

## 2019-09-06 DIAGNOSIS — G4733 Obstructive sleep apnea (adult) (pediatric): Secondary | ICD-10-CM

## 2019-09-06 NOTE — Assessment & Plan Note (Signed)
History of ongoing tobacco abuse of 1/2 pack/day recalcitrant risk factor modification. 

## 2019-09-06 NOTE — Patient Instructions (Signed)

## 2019-09-06 NOTE — Assessment & Plan Note (Signed)
History of hyperlipidemia on statin therapy with lipid profile performed 08/02/2019 revealing total cholesterol 55, LDL of 82 and HDL 53.

## 2019-09-06 NOTE — Telephone Encounter (Signed)
Please refill as per office routine med refill policy (all routine meds refilled for 3 mo or monthly per pt preference up to one year from last visit, then month to month grace period for 3 mo, then further med refills will have to be denied)  

## 2019-09-06 NOTE — Progress Notes (Signed)
09/06/2019 Rolf Friedli   [birth year]  [id number]  Primary Physician Biagio Borg, MD Primary Cardiologist: Lorretta Harp MD Lupe Carney, [place]  HPI:  [name] is a 59 y.o.  mildly overweight, single [place] American male, father of 104, grandfather to 2 grandchildren who works as a Administrator for [employer]. I saw him in the office  01/27/2018. His risk factors include tobacco abuse, hypertension and dyslipidemia. I catheterized him9/26/17July 26, 2010, revealing normal coronary arteries and normal LV function. I felt his chest pain symptoms were related to reflux. Dr. Jenny Reichmann has been adjusting antihypertensive medications. He is aware of salt intake. He does have significant LVH on his EKG with a past echo performed 2 years ago revealed moderate concentric LVH with normal LV function.  Since I saw him 2 years ago he continues to do well.  He also continues to smoke a half a pack a day and wears his CPAP intermittently.  He denies chest pain or shortness of breath.  Current Meds  Medication Sig  . amLODipine-benazepril (LOTREL) 10-40 MG capsule TAKE 1 CAPSULE BY MOUTH EVERY DAY  . aspirin 81 MG EC tablet Take 81 mg by mouth daily.    . Blood Glucose Monitoring Suppl (ONE TOUCH ULTRA 2) w/Device KIT Use as directed  S96.2  . BYSTOLIC 10 MG tablet TAKE 2 TABLETS (20 MG TOTAL) BY MOUTH DAILY. MUST KEEP SCHEDULED APPT FOR FUTURE REFILLS  . Docusate Calcium ([facility] STOOL SOFTENER PO) Take 2 tablets by mouth 2 (two) times daily.   . hydrALAZINE (APRESOLINE) 50 MG tablet Take 1 tablet (50 mg total) by mouth 3 (three) times daily. Overdue for Annual must see provider for future refills  . hydrochlorothiazide (HYDRODIURIL) 25 MG tablet Take 1 tablet (25 mg total) by mouth daily.  Marland Kitchen ibuprofen (ADVIL,MOTRIN) 200 MG tablet Take 400 mg by mouth every 6 (six) hours as needed. Reported on 06/01/2015  . Lancets MISC Use as directed E11.9  . metFORMIN (GLUCOPHAGE-XR) 500 MG 24 hr  tablet TAKE 2 TABLETS BY MOUTH EVERY DAY WITH BREAKFAST  . naproxen (NAPROSYN) 500 MG tablet Take 1 tablet (500 mg total) by mouth 2 (two) times daily.  Marland Kitchen omeprazole (PRILOSEC) 40 MG capsule TAKE 1 CAPSULE (40 MG TOTAL) BY MOUTH DAILY. OVERDUE FOR ANNUAL APPT MUST SEE PROVIDER FOR FUTURE REFILLS  . oxaprozin (DAYPRO) 600 MG tablet TAKE 1 TABLET BY MOUTH TWICE A DAY  . potassium chloride (KLOR-CON M10) 10 MEQ tablet Take 2 tablets (20 mEq total) by mouth daily. Must keep scheduled appt in June for future refills  . pravastatin (PRAVACHOL) 40 MG tablet Take 1 tablet (40 mg total) by mouth daily.  . sitaGLIPtin (JANUVIA) 100 MG tablet Take 1 tablet (100 mg total) by mouth daily.  . [DISCONTINUED] albuterol (VENTOLIN HFA) 108 (90 Base) MCG/ACT inhaler Inhale 2 puffs into the lungs every 6 (six) hours as needed for wheezing or shortness of breath. Annual appt is due must see provider for future refills  . [DISCONTINUED] glucose blood (ONE TOUCH ULTRA TEST) test strip TEST THREE TIMES A DAY AS INSTRUCTED.  . [DISCONTINUED] predniSONE (DELTASONE) 10 MG tablet 3 tabs by mouth per day for 3 days,2 tabs per day for 3 days,1tab per day for 3 days     Allergies  Allergen Reactions  . Sulfa Antibiotics Hives and Swelling  . Vicodin [Hydrocodone-Acetaminophen] Itching    Social History   Socioeconomic History  . Marital status: Single  Spouse name: Not on file  . Number of children: 1  . Years of education: Not on file  . Highest education level: Not on file  Occupational History  . Occupation: work as truck Education administrator: [employer]  Tobacco Use  . Smoking status: Current Every Day Smoker    Packs/day: 0.50    Types: Cigarettes  . Smokeless tobacco: Never Used  Substance and Sexual Activity  . Alcohol use: Yes    Alcohol/week: 3.0 standard drinks    Types: 3 Standard drinks or equivalent per week    Comment: occaisionally  . Drug use: No  . Sexual activity: Not on file    Other Topics Concern  . Not on file  Social History Narrative  . Not on file   Social Determinants of Health   Financial Resource Strain:   . Difficulty of Paying Living Expenses:   Food Insecurity:   . Worried About Charity fundraiser in the Last Year:   . Arboriculturist in the Last Year:   Transportation Needs:   . Film/video editor (Medical):   Marland Kitchen Lack of Transportation (Non-Medical):   Physical Activity:   . Days of Exercise per Week:   . Minutes of Exercise per Session:   Stress:   . Feeling of Stress :   Social Connections:   . Frequency of Communication with Friends and Family:   . Frequency of Social Gatherings with Friends and Family:   . Attends Religious Services:   . Active Member of Clubs or Organizations:   . Attends Archivist Meetings:   Marland Kitchen Marital Status:   Intimate Partner Violence:   . Fear of Current or Ex-Partner:   . Emotionally Abused:   Marland Kitchen Physically Abused:   . Sexually Abused:      Review of Systems: General: negative for chills, fever, night sweats or weight changes.  Cardiovascular: negative for chest pain, dyspnea on exertion, edema, orthopnea, palpitations, paroxysmal nocturnal dyspnea or shortness of breath Dermatological: negative for rash Respiratory: negative for cough or wheezing Urologic: negative for hematuria Abdominal: negative for nausea, vomiting, diarrhea, bright red blood per rectum, melena, or hematemesis Neurologic: negative for visual changes, syncope, or dizziness All other systems reviewed and are otherwise negative except as noted above.    Blood pressure 104/60, pulse 68, height 6' 1"  (1.854 m), weight 176 lb (79.8 kg).  General appearance: alert and no distress Neck: no adenopathy, no carotid bruit, no JVD, supple, symmetrical, trachea midline and thyroid not enlarged, symmetric, no tenderness/mass/nodules Lungs: clear to auscultation bilaterally Heart: regular rate and rhythm, S1, S2 normal, no  murmur, click, rub or gallop Extremities: extremities normal, atraumatic, no cyanosis or edema Pulses: 2+ and symmetric Skin: Skin color, texture, turgor normal. No rashes or lesions Neurologic: Alert and oriented X 3, normal strength and tone. Normal symmetric reflexes. Normal coordination and gait  EKG sinus rhythm at 68 without ST or T wave changes.  I personally reviewed this EKG.  ASSESSMENT AND PLAN:   Hyperlipidemia History of hyperlipidemia on statin therapy with lipid profile performed 08/02/2019 revealing total cholesterol 55, LDL of 82 and HDL 53.  Essential hypertension History of essential hypertension a blood pressure measured today 104/60.  He is on amlodipine, benazepril, Bystolic, hydralazine and HydroDIURIL.  Tobacco abuse History of ongoing tobacco abuse of 1/2 pack/day recalcitrant risk factor modification.  SLEEP APNEA, OBSTRUCTIVE History of obstructive sleep apnea wearing CPAP intermittently.  Lorretta Harp MD FACP,FACC,FAHA, [facility] 09/06/2019 1:49 PM

## 2019-09-06 NOTE — Assessment & Plan Note (Signed)
History of essential hypertension a blood pressure measured today 104/60.  He is on amlodipine, benazepril, Bystolic, hydralazine and HydroDIURIL.

## 2019-09-06 NOTE — Assessment & Plan Note (Signed)
History of obstructive sleep apnea wearing CPAP intermittently.

## 2019-09-13 ENCOUNTER — Encounter: Payer: Self-pay | Admitting: Podiatry

## 2019-09-13 ENCOUNTER — Ambulatory Visit: Payer: 59 | Admitting: Podiatry

## 2019-09-13 ENCOUNTER — Other Ambulatory Visit: Payer: Self-pay

## 2019-09-13 DIAGNOSIS — M779 Enthesopathy, unspecified: Secondary | ICD-10-CM | POA: Diagnosis not present

## 2019-09-13 MED ORDER — OXAPROZIN 600 MG PO TABS: 600.0000 mg | ORAL_TABLET | Freq: Two times a day (BID) | ORAL | 1 refills | Status: DC

## 2019-09-13 NOTE — Progress Notes (Signed)
This patient presents the office with chief complaint of a painful red and swollen big toe joint left foot.  He says this is started 1 day ago and is worsening over time.  He had a similar problem which was treated successfully with Daypro 1 year ago.  This patient has a previous history of acute gout renal insufficiency and diabetes.  Patient presents for an evaluation and treatment of his painful left big toe joint.  Vascular  Dorsalis pedis and posterior tibial pulses are palpable  B/L.  Capillary return  WNL.  Temperature gradient is  WNL.  Skin turgor  WNL  Sensorium  Senn Weinstein monofilament wire  WNL. Normal tactile sensation.  Nail Exam  Patient has normal nails with no evidence of bacterial or fungal infection.  Orthopedic  Exam  Muscle tone and muscle strength  WNL.  No limitations of motion feet  B/L.  No crepitus or joint effusion noted.  Foot type is unremarkable and digits show no abnormalities.  Bony prominences are unremarkable.  Skin  No open lesions.  Normal skin texture and turgor.  Capsulitis 1st MPJ  Left foot  ROV.  Discussed this condition with this patient.  Prescribed Daypro 600 mg 1 twice daily as needed for pain.  Patient was told to limit his medication due to his renal disease.  Return to clinic as needed   Gardiner Barefoot DPM

## 2019-09-15 NOTE — Addendum Note (Signed)
Addended by: Zebedee Iba on: 09/15/2019 12:49 PM   Modules accepted: Orders

## 2019-09-26 ENCOUNTER — Other Ambulatory Visit: Payer: Self-pay | Admitting: Internal Medicine

## 2019-09-30 ENCOUNTER — Other Ambulatory Visit: Payer: Self-pay | Admitting: Internal Medicine

## 2019-10-05 ENCOUNTER — Other Ambulatory Visit: Payer: Self-pay | Admitting: Internal Medicine

## 2019-10-05 NOTE — Telephone Encounter (Signed)
Please refill as per office routine med refill policy (all routine meds refilled for 3 mo or monthly per pt preference up to one year from last visit, then month to month grace period for 3 mo, then further med refills will have to be denied)  

## 2019-10-15 ENCOUNTER — Other Ambulatory Visit: Payer: Self-pay | Admitting: Internal Medicine

## 2019-10-15 NOTE — Telephone Encounter (Signed)
Please refill as per office routine med refill policy (all routine meds refilled for 3 mo or monthly per pt preference up to one year from last visit, then month to month grace period for 3 mo, then further med refills will have to be denied)  

## 2019-11-08 ENCOUNTER — Other Ambulatory Visit: Payer: Self-pay | Admitting: Podiatry

## 2019-11-08 NOTE — Telephone Encounter (Signed)
Left message informing pt Dr. Prudence Davidson had declined to refill the requested medication and felt he should be followed by his PCP or specialist, due to this not being a long term medication.

## 2019-11-08 NOTE — Telephone Encounter (Signed)
Dr. Prudence Davidson states he does not want to refill the Daypro with pt's kidney health status, but accidentally approved. I called [facility] [place] and told her to cancel the Daypro.

## 2019-11-09 ENCOUNTER — Other Ambulatory Visit: Payer: Self-pay | Admitting: Internal Medicine

## 2019-11-09 NOTE — Telephone Encounter (Signed)
Please refill as per office routine med refill policy (all routine meds refilled for 3 mo or monthly per pt preference up to one year from last visit, then month to month grace period for 3 mo, then further med refills will have to be denied)  

## 2019-12-18 ENCOUNTER — Other Ambulatory Visit: Payer: Self-pay | Admitting: Internal Medicine

## 2019-12-18 NOTE — Telephone Encounter (Signed)
Please refill as per office routine med refill policy (all routine meds refilled for 3 mo or monthly per pt preference up to one year from last visit, then month to month grace period for 3 mo, then further med refills will have to be denied)  

## 2019-12-20 ENCOUNTER — Other Ambulatory Visit: Payer: Self-pay

## 2019-12-20 ENCOUNTER — Ambulatory Visit: Payer: 59 | Admitting: Endocrinology

## 2019-12-20 ENCOUNTER — Encounter: Payer: Self-pay | Admitting: Endocrinology

## 2019-12-20 VITALS — BP 120/74 | HR 70 | Ht 73.0 in | Wt 175.8 lb

## 2019-12-20 DIAGNOSIS — E042 Nontoxic multinodular goiter: Secondary | ICD-10-CM

## 2019-12-20 NOTE — Patient Instructions (Addendum)
Let's recheck the ultrasound.  you will receive a phone call, about a day and time for an appointment.   Please come back for a follow-up appointment in 1 year.   

## 2019-12-20 NOTE — Progress Notes (Signed)
Subjective:    Patient ID: [name], male    DOB: December 24, 1960, 59 y.o.   MRN: [id number]  HPI Pt returns for f/u of multinodular goiter (dx'ed 2016; nuc med scan and US showed multinodular goiter, right lobe >> left; bx in 2016 showed beth cat 2; he is euthyroid; he has never been on thyroid medication; f/u US in 2021 showed slight enlargement).  No weight change since last ov here.  He reports early satiety, but no assoc dysphagia.   Past Medical History:  Diagnosis Date   Chest pain 02/13/2012   echo - EF >55%;mod concentric L ventricular hypertrophy; atrial septum aneurysmal; patent foramen ovale suspected on color flow, LA mil/mod dilation; RV systolic pressure [id number]   Chest pain 06/09/2007   echo - EF 45-50%; interarterial shunt; mild valvular regurgitation;    Chest pain 06/09/2007   Myoview - EF 53%; normal perfusion all regions;EKG negative for ischemia    Diabetes mellitus    type II   DIABETES MELLITUS, TYPE II 08/29/2009   GERD (gastroesophageal reflux disease) 08/12/2010   Hemorrhoids    History of anal fissures    Hyperlipidemia    HYPERLIPIDEMIA 08/29/2009   Hypertension    HYPERTENSION 01/14/2008   Lightheadedness 05/10/2002   30d monitor unremarkable   LVH (left ventricular hypertrophy)    with systolic dysfunction by echo 10/03, EF 45-50% - Dr. Melvern Banker   Multinodular goiter 12/06/2015   Nonspecific ST-T wave electrocardiographic changes 08/11/2006   Myoview - EF 49%; normal perfusion all regions, EKG negative for ischemia   OSA (obstructive sleep apnea)    Preventative health care 08/11/2010   Sleep apnea 08/13/2008   AHI 1.2; sleep study 10/2007 - AHI 9.47at 15cm H2O and 31.3 at [place], OBSTRUCTIVE 01/14/2008    Past Surgical History:  Procedure Laterality Date   ANAL SPHINCTEROTOMY  08/20/11   CARDIAC CATHETERIZATION  10/23/2008   normal coronary arteries and LV function   COLONOSCOPY     POLYPECTOMY      Social  History   Socioeconomic History   Marital status: Single    Spouse name: Not on file   Number of children: 1   Years of education: Not on file   Highest education level: Not on file  Occupational History   Occupation: work as truck Education administrator: [employer]  Tobacco Use   Smoking status: Current Every Day Smoker    Packs/day: 0.50    Types: Cigarettes   Smokeless tobacco: Never Used  Substance and Sexual Activity   Alcohol use: Yes    Alcohol/week: 3.0 standard drinks    Types: 3 Standard drinks or equivalent per week    Comment: occaisionally   Drug use: No   Sexual activity: Not on file  Other Topics Concern   Not on file  Social History Narrative   Not on file   Social Determinants of Health   Financial Resource Strain:    Difficulty of Paying Living Expenses: Not on file  Food Insecurity:    Worried About [place] in the Last Year: Not on file   Ran Out of Food in the Last Year: Not on file  Transportation Needs:    Lack of Transportation (Medical): Not on file   Lack of Transportation (Non-Medical): Not on file  Physical Activity:    Days of Exercise per Week: Not on file   Minutes of Exercise per Session: Not on file  Stress:    Feeling of Stress : Not on file  Social Connections:    Frequency of Communication with Friends and Family: Not on file   Frequency of Social Gatherings with Friends and Family: Not on file   Attends Religious Services: Not on file   Active Member of Clubs or Organizations: Not on file   Attends Archivist Meetings: Not on file   Marital Status: Not on file  Intimate Partner Violence:    Fear of Current or Ex-Partner: Not on file   Emotionally Abused: Not on file   Physically Abused: Not on file   Sexually Abused: Not on file    Current Outpatient Medications on File Prior to Visit  Medication Sig Dispense Refill   amLODipine-benazepril (LOTREL) 10-40 MG capsule  TAKE 1 CAPSULE BY MOUTH EVERY DAY 90 capsule 3   aspirin 81 MG EC tablet Take 81 mg by mouth daily.       Blood Glucose Monitoring Suppl (ONE TOUCH ULTRA 2) w/Device KIT Use as directed  E11.9 1 each 0   BYSTOLIC 10 MG tablet TAKE 2 TABLETS (20 MG TOTAL) BY MOUTH DAILY. MUST KEEP SCHEDULED APPT FOR FUTURE REFILLS 60 tablet 1   Docusate Calcium ([facility] STOOL SOFTENER PO) Take 2 tablets by mouth 2 (two) times daily.      hydrALAZINE (APRESOLINE) 50 MG tablet Take 1 tablet (50 mg total) by mouth 3 (three) times daily. 270 tablet 1   hydrochlorothiazide (HYDRODIURIL) 25 MG tablet Take 1 tablet (25 mg total) by mouth daily. 90 tablet 3   ibuprofen (ADVIL,MOTRIN) 200 MG tablet Take 400 mg by mouth every 6 (six) hours as needed. Reported on 06/01/2015     JANUVIA 100 MG tablet TAKE 1 TABLET BY MOUTH EVERY DAY 90 tablet 0   Lancets MISC Use as directed E11.9 100 each 11   metFORMIN (GLUCOPHAGE-XR) 500 MG 24 hr tablet TAKE 2 TABLETS BY MOUTH EVERY DAY WITH BREAKFAST 180 tablet 0   naproxen (NAPROSYN) 500 MG tablet Take 1 tablet (500 mg total) by mouth 2 (two) times daily. 20 tablet 0   omeprazole (PRILOSEC) 40 MG capsule Take 1 capsule (40 mg total) by mouth daily. 90 capsule 3   oxaprozin (DAYPRO) 600 MG tablet TAKE 1 TABLET BY MOUTH TWICE A DAY 60 tablet 0   potassium chloride (KLOR-CON M10) 10 MEQ tablet Take 2 tablets (20 mEq total) by mouth daily. Must keep scheduled appt in June for future refills 60 tablet 1   pravastatin (PRAVACHOL) 40 MG tablet Take 1 tablet (40 mg total) by mouth daily. 90 tablet 1   No current facility-administered medications on file prior to visit.    Allergies  Allergen Reactions   Sulfa Antibiotics Hives and Swelling   Vicodin [Hydrocodone-Acetaminophen] Itching    Family History  Problem Relation Age of Onset   Hypertension Mother    Heart disease Father    Colon cancer Neg Hx    Thyroid disease Neg Hx     BP 120/74    Pulse 70    Ht 6' 1"   (1.854 m)    Wt 175 lb 12.8 oz (79.7 kg)    SpO2 99%    BMI 23.19 kg/m    Review of Systems Denies sob and neck pain.      Objective:   Physical Exam VITAL SIGNS:  See vs page GENERAL: no distress NECK: the right thyroid mass is again easily palpable.  It is freely mobile.  No  palpable lymphadenopathy at the neck.     Lab Results  Component Value Date   TSH 1.03 08/02/2019      Assessment & Plan:  Early satiety, uncertain etiology and prognosis.  he declines ref GI.   MNG: due for recheck.    Patient Instructions  Let's recheck the ultrasound.  you will receive a phone call, about a day and time for an appointment.    Please come back for a follow-up appointment in 1 year.

## 2020-01-02 ENCOUNTER — Other Ambulatory Visit: Payer: Self-pay | Admitting: Internal Medicine

## 2020-01-09 ENCOUNTER — Ambulatory Visit
Admission: RE | Admit: 2020-01-09 | Discharge: 2020-01-09 | Disposition: A | Discharge status: Home / Self Care | Payer: 59 | Source: Ambulatory Visit | Attending: Endocrinology | Admitting: Endocrinology

## 2020-01-09 DIAGNOSIS — E042 Nontoxic multinodular goiter: Secondary | ICD-10-CM

## 2020-01-09 IMAGING — US US THYROID
1 series · 13 of 25 positions shown · non-contrast
Comparison: [DATE]

CLINICAL DATA: Prior ultrasound follow-up.

EXAM:
THYROID ULTRASOUND
TECHNIQUE: Ultrasound examination of the thyroid gland and adjacent soft
tissues was performed.

[Series 1: us thyroid · 0.12mm/px · 13 of 34 slices shown]
[im 1/34]
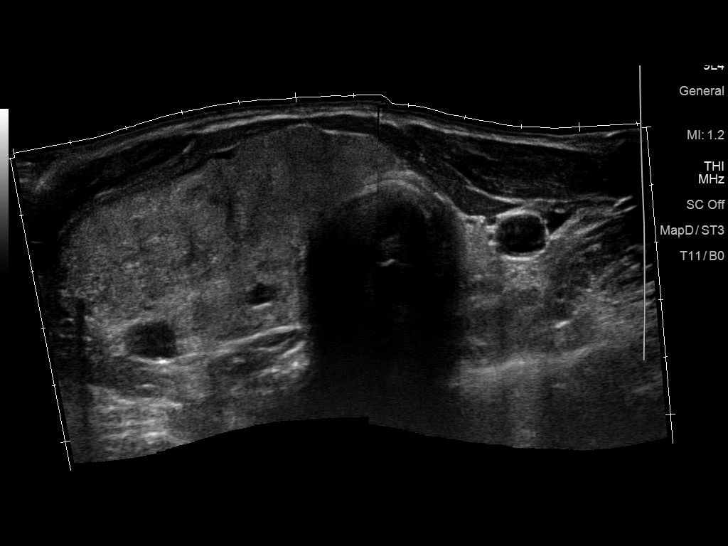
[im 3/34]
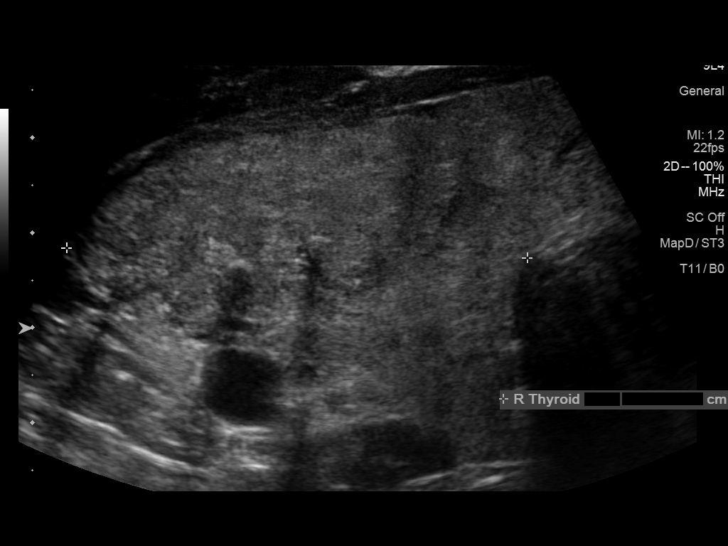
[im 6/34]
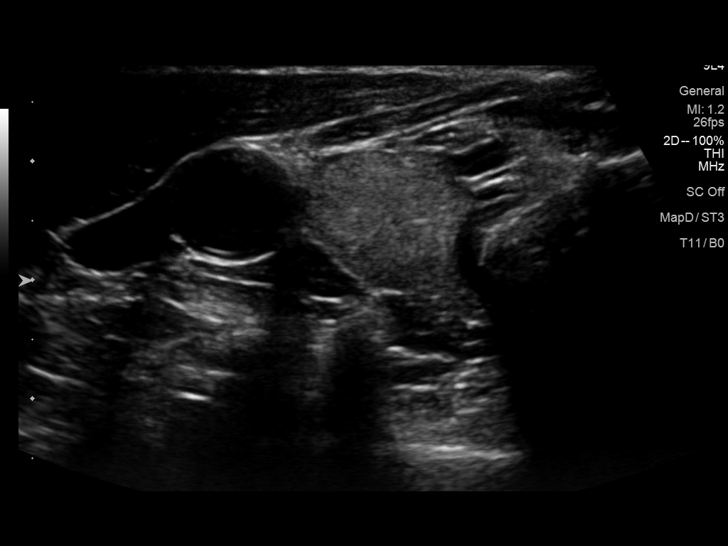
[im 9/34]
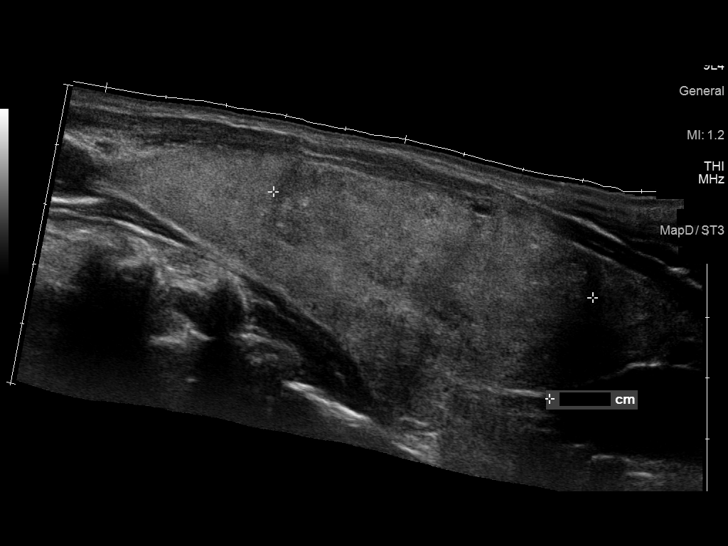
[im 12/34]
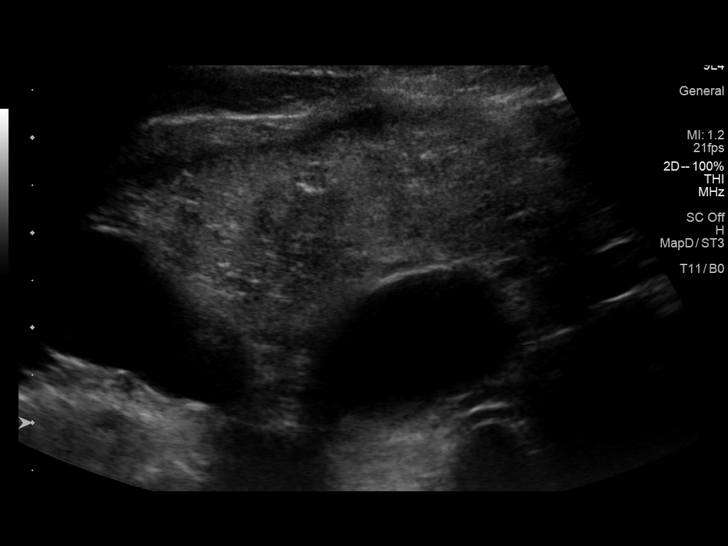
[im 14/34]
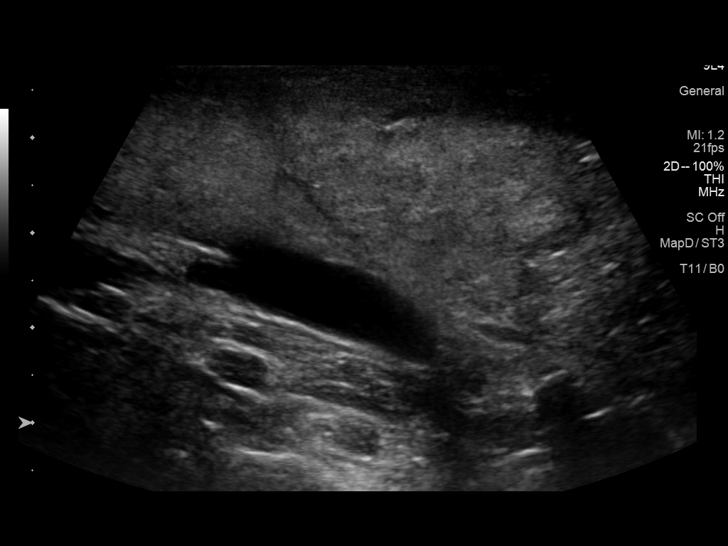
[im 17/34]
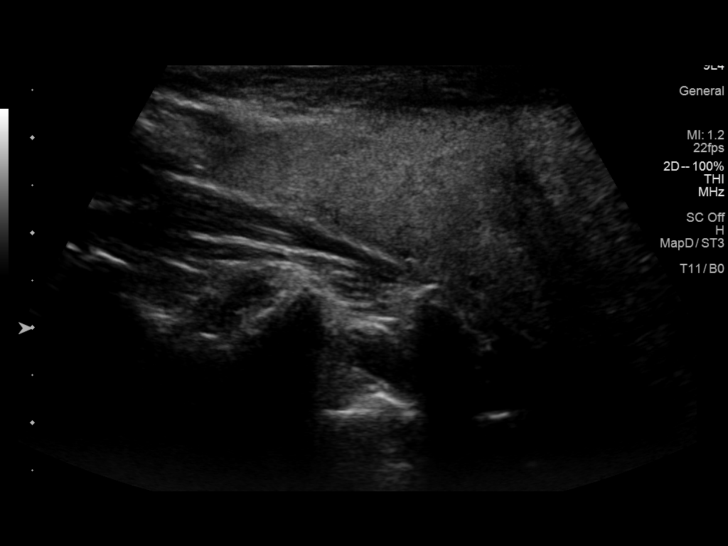
[im 20/34]
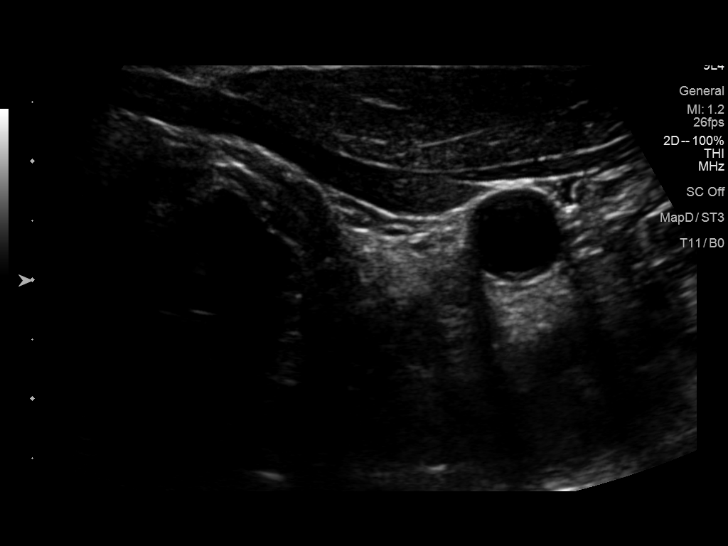
[im 23/34]
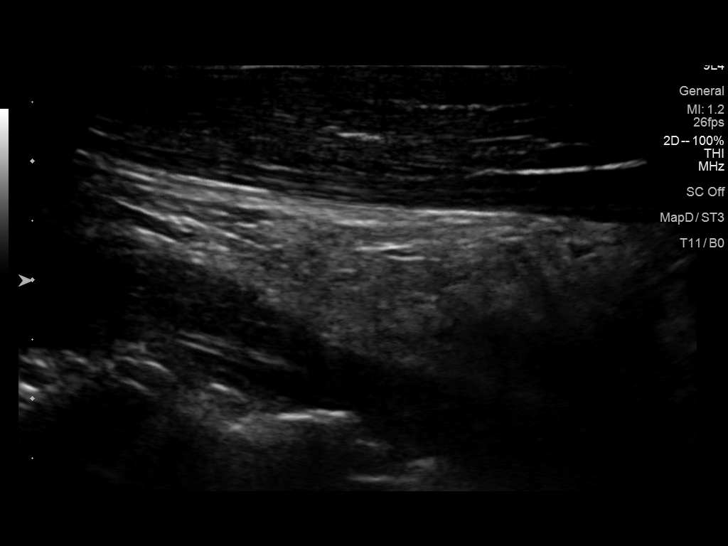
[im 25/34]
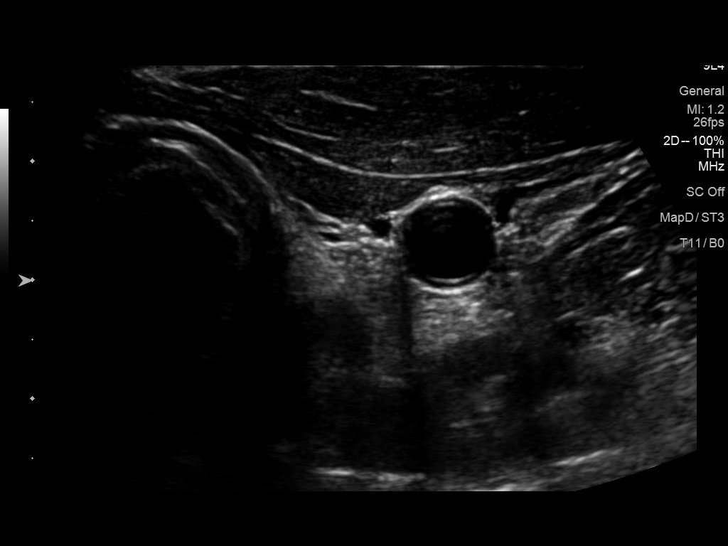
[im 28/34]
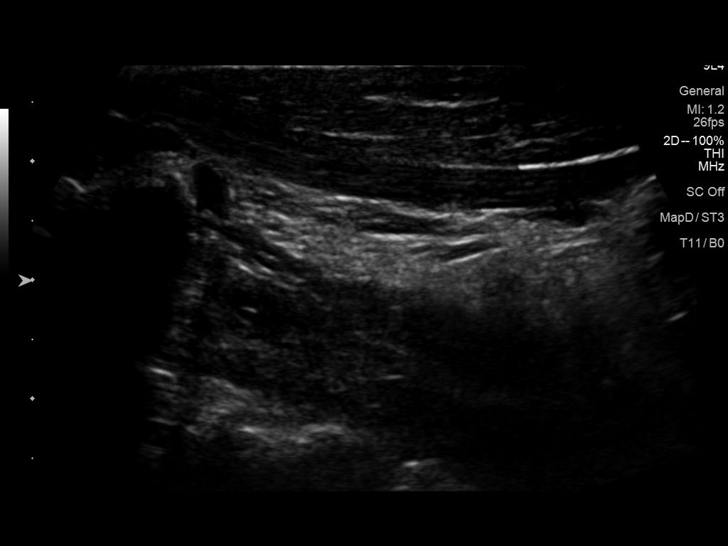
[im 31/34]
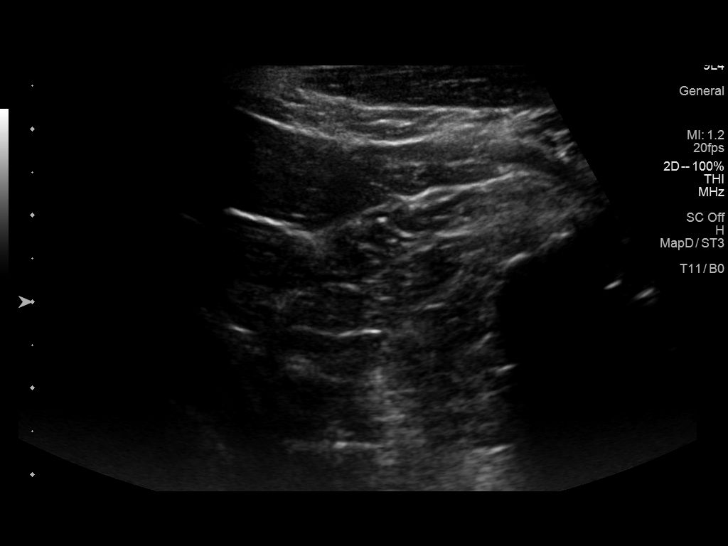
[im 34/34]
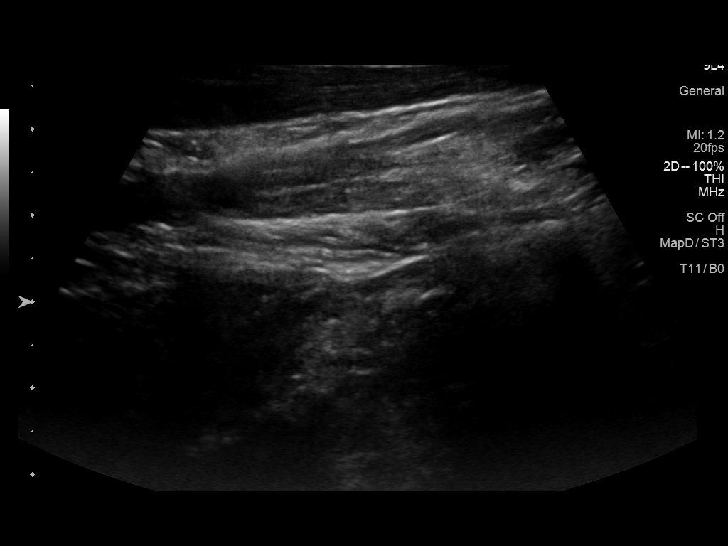

[13 of 25 positions shown; findings below may reference images not displayed]

FINDINGS: Parenchymal Echotexture: Moderately heterogenous

Isthmus: 0.2 cm, previously 0.3 cm

Right lobe: 8.3 x 4.0 x 4.9 cm, previously 9.1 x 2.8 x 4.6 cm

Left lobe: Atrophic, unable to measure reliably.

_________________________________________________________

Estimated total number of nodules >/= 1 cm: 1

Number of spongiform nodules >/=  2 cm not described below (TR1): 0

Number of mixed cystic and solid nodules >/= 1.5 cm not described
below (TR2): 0

_________________________________________________________

Nodule # 1:

Prior biopsy: No

Location: Right; Inferior

Maximum size: 5.5 cm; Other 2 dimensions: 5.4 x 3.4 cm, previously,
7.0 x 5.8 x 2.6 cm

Composition: solid/almost completely solid (2)

Echogenicity: isoechoic (1)

Shape: not taller-than-wide (0)

Margins: ill-defined (0)

Echogenic foci: macrocalcifications (1)

ACR TI-RADS total points: 4.

ACR TI-RADS risk category:  TR4 (4-6 points).

Significant change in size (>/= 20% in two dimensions and minimal
increase of 2 mm): No

Change in features: No

Change in ACR TI-RADS risk category: No

ACR TI-RADS recommendations:

**Given size (>/= 1.5 cm) and appearance, fine needle aspiration of
this moderately suspicious nodule should be considered based on
TI-RADS criteria.

_________________________________________________________
IMPRESSION: 1. Slight interval decreased size (measuring up to 5.5 cm,
previously 7.0 cm) of the previously visualized right inferior solid
thyroid nodule (labeled 1) which remains within criteria for tissue
sampling if clinically indicated.
2. Continued atrophy of the left lobe, essentially imperceptible
sonographically.

The above is in keeping with the ACR TI-RADS recommendations - [HOSPITAL] [FH];[DATE].

## 2020-01-27 ENCOUNTER — Other Ambulatory Visit: Payer: Self-pay

## 2020-01-27 ENCOUNTER — Other Ambulatory Visit: Payer: Self-pay | Admitting: Internal Medicine

## 2020-01-27 ENCOUNTER — Other Ambulatory Visit (INDEPENDENT_AMBULATORY_CARE_PROVIDER_SITE_OTHER): Payer: 59

## 2020-01-27 ENCOUNTER — Other Ambulatory Visit: Payer: 59

## 2020-01-27 DIAGNOSIS — Z794 Long term (current) use of insulin: Secondary | ICD-10-CM

## 2020-01-27 DIAGNOSIS — E0800 Diabetes mellitus due to underlying condition with hyperosmolarity without nonketotic hyperglycemic-hyperosmolar coma (NKHHC): Secondary | ICD-10-CM | POA: Diagnosis not present

## 2020-01-27 DIAGNOSIS — E7800 Pure hypercholesterolemia, unspecified: Secondary | ICD-10-CM

## 2020-01-27 DIAGNOSIS — N289 Disorder of kidney and ureter, unspecified: Secondary | ICD-10-CM

## 2020-01-27 DIAGNOSIS — M109 Gout, unspecified: Secondary | ICD-10-CM

## 2020-01-27 DIAGNOSIS — I10 Essential (primary) hypertension: Secondary | ICD-10-CM

## 2020-01-27 LAB — CBC WITH DIFFERENTIAL/PLATELET
Basophils Absolute: 0 10*3/uL (ref 0.0–0.1)
Basophils Relative: 0.9 % (ref 0.0–3.0)
Eosinophils Absolute: 0.2 10*3/uL (ref 0.0–0.7)
Eosinophils Relative: 3.4 % (ref 0.0–5.0)
HCT: 39.4 % (ref 39.0–52.0)
Hemoglobin: 13.3 g/dL (ref 13.0–17.0)
Lymphocytes Relative: 28.2 % (ref 12.0–46.0)
Lymphs Abs: 1.4 10*3/uL (ref 0.7–4.0)
MCHC: 33.7 g/dL (ref 30.0–36.0)
MCV: 97.8 fl (ref 78.0–100.0)
Monocytes Absolute: 0.8 10*3/uL (ref 0.1–1.0)
Monocytes Relative: 16.5 % — ABNORMAL HIGH (ref 3.0–12.0)
Neutro Abs: 2.5 10*3/uL (ref 1.4–7.7)
Neutrophils Relative %: 51 % (ref 43.0–77.0)
Platelets: 233 10*3/uL (ref 150.0–400.0)
RBC: 4.03 Mil/uL — ABNORMAL LOW (ref 4.22–5.81)
RDW: 13.4 % (ref 11.5–15.5)
WBC: 4.9 10*3/uL (ref 4.0–10.5)

## 2020-01-27 LAB — HEPATIC FUNCTION PANEL
ALT: 14 U/L (ref 0–53)
AST: 18 U/L (ref 0–37)
Albumin: 4.5 g/dL (ref 3.5–5.2)
Alkaline Phosphatase: 46 U/L (ref 39–117)
Bilirubin, Direct: 0.1 mg/dL (ref 0.0–0.3)
Total Bilirubin: 0.7 mg/dL (ref 0.2–1.2)
Total Protein: 7.2 g/dL (ref 6.0–8.3)

## 2020-01-27 LAB — BASIC METABOLIC PANEL
BUN: 16 mg/dL (ref 6–23)
CO2: 32 mEq/L (ref 19–32)
Calcium: 9.7 mg/dL (ref 8.4–10.5)
Chloride: 100 mEq/L (ref 96–112)
Creatinine, Ser: 1.28 mg/dL (ref 0.40–1.50)
GFR: 60.75 mL/min (ref >60.00)
Glucose, Bld: 93 mg/dL (ref 70–99)
Potassium: 3.1 mEq/L — ABNORMAL LOW (ref 3.5–5.1)
Sodium: 141 mEq/L (ref 135–145)

## 2020-01-27 LAB — PHOSPHORUS: Phosphorus: 3.6 mg/dL (ref 2.3–4.6)

## 2020-01-27 LAB — LIPID PANEL
Cholesterol: 162 mg/dL (ref 0–200)
HDL: 64.3 mg/dL (ref >39.00)
LDL Cholesterol: 86 mg/dL (ref 0–99)
NonHDL: 97.43
Total CHOL/HDL Ratio: 3
Triglycerides: 58 mg/dL (ref 0.0–149.0)
VLDL: 11.6 mg/dL (ref 0.0–40.0)

## 2020-01-27 LAB — HEMOGLOBIN A1C: Hgb A1c MFr Bld: 5.8 % (ref 4.6–6.5)

## 2020-01-27 LAB — VITAMIN D 25 HYDROXY (VIT D DEFICIENCY, FRACTURES): VITD: 37.75 ng/mL (ref 30.00–100.00)

## 2020-01-27 LAB — URIC ACID: Uric Acid, Serum: 7.8 mg/dL (ref 4.0–7.8)

## 2020-01-30 LAB — PTH, INTACT AND CALCIUM
Calcium: 10 mg/dL (ref 8.6–10.3)
PTH: 30 pg/mL (ref 14–64)

## 2020-02-02 ENCOUNTER — Ambulatory Visit: Payer: 59 | Admitting: Internal Medicine

## 2020-02-02 ENCOUNTER — Other Ambulatory Visit: Payer: Self-pay | Admitting: Internal Medicine

## 2020-02-13 ENCOUNTER — Other Ambulatory Visit: Payer: Self-pay

## 2020-02-13 ENCOUNTER — Encounter: Payer: Self-pay | Admitting: Internal Medicine

## 2020-02-13 ENCOUNTER — Ambulatory Visit: Payer: 59 | Admitting: Internal Medicine

## 2020-02-13 VITALS — BP 120/78 | HR 72 | Temp 98.7°F | Ht 73.0 in | Wt 177.0 lb

## 2020-02-13 DIAGNOSIS — E119 Type 2 diabetes mellitus without complications: Secondary | ICD-10-CM | POA: Diagnosis not present

## 2020-02-13 DIAGNOSIS — E0800 Diabetes mellitus due to underlying condition with hyperosmolarity without nonketotic hyperglycemic-hyperosmolar coma (NKHHC): Secondary | ICD-10-CM

## 2020-02-13 DIAGNOSIS — I10 Essential (primary) hypertension: Secondary | ICD-10-CM | POA: Diagnosis not present

## 2020-02-13 DIAGNOSIS — N289 Disorder of kidney and ureter, unspecified: Secondary | ICD-10-CM

## 2020-02-13 DIAGNOSIS — Z0000 Encounter for general adult medical examination without abnormal findings: Secondary | ICD-10-CM

## 2020-02-13 DIAGNOSIS — E782 Mixed hyperlipidemia: Secondary | ICD-10-CM | POA: Diagnosis not present

## 2020-02-13 DIAGNOSIS — E785 Hyperlipidemia, unspecified: Secondary | ICD-10-CM

## 2020-02-13 DIAGNOSIS — F17210 Nicotine dependence, cigarettes, uncomplicated: Secondary | ICD-10-CM

## 2020-02-13 DIAGNOSIS — Z794 Long term (current) use of insulin: Secondary | ICD-10-CM

## 2020-02-13 NOTE — Patient Instructions (Addendum)

## 2020-02-13 NOTE — Progress Notes (Addendum)
Subjective:    Patient ID: [name], male    DOB: 1960-06-10, 59 y.o.   MRN: [id number]  HPI  Here to f/u; overall doing ok,  Pt denies chest pain, increasing sob or doe, wheezing, orthopnea, PND, increased LE swelling, palpitations, dizziness or syncope.  Pt denies new neurological symptoms such as new headache, or facial or extremity weakness or numbness.  Pt denies polydipsia, polyuria, or low sugar episode.  Pt states overall good compliance with meds, mostly trying to follow appropriate diet, with wt overall stable,  but little exercise however. No new complaints Past Medical History:  Diagnosis Date  . Chest pain 02/13/2012   echo - EF >55%;mod concentric L ventricular hypertrophy; atrial septum aneurysmal; patent foramen ovale suspected on color flow, LA mil/mod dilation; RV systolic pressure 20BTDH  . Chest pain 06/09/2007   echo - EF 45-50%; interarterial shunt; mild valvular regurgitation;   . Chest pain 06/09/2007   Myoview - EF 53%; normal perfusion all regions;EKG negative for ischemia   . Diabetes mellitus    type II  . DIABETES MELLITUS, TYPE II 08/29/2009  . GERD (gastroesophageal reflux disease) 08/12/2010  . Hemorrhoids   . History of anal fissures   . Hyperlipidemia   . HYPERLIPIDEMIA 08/29/2009  . Hypertension   . HYPERTENSION 01/14/2008  . Lightheadedness 05/10/2002   30d monitor unremarkable  . LVH (left ventricular hypertrophy)    with systolic dysfunction by echo 10/03, EF 45-50% - Dr. Melvern Banker  . Multinodular goiter 12/06/2015  . Nonspecific ST-T wave electrocardiographic changes 08/11/2006   Myoview - EF 49%; normal perfusion all regions, EKG negative for ischemia  . OSA (obstructive sleep apnea)   . Preventative health care 08/11/2010  . Sleep apnea 08/13/2008   AHI 1.2; sleep study 10/2007 - AHI 9.47at 15cm H2O and 31.3 at 16cm H2O  . SLEEP APNEA, OBSTRUCTIVE 01/14/2008   Past Surgical History:  Procedure Laterality Date  . ANAL SPHINCTEROTOMY  08/20/11  .  CARDIAC CATHETERIZATION  10/23/2008   normal coronary arteries and LV function  . COLONOSCOPY    . POLYPECTOMY      reports that he has been smoking cigarettes. He has been smoking about 0.50 packs per day. He has never used smokeless tobacco. He reports current alcohol use of about 3.0 standard drinks of alcohol per week. He reports that he does not use drugs. family history includes Heart disease in his father; Hypertension in his mother. Allergies  Allergen Reactions  . Sulfa Antibiotics Hives and Swelling  . Vicodin [Hydrocodone-Acetaminophen] Itching   Current Outpatient Medications on File Prior to Visit  Medication Sig Dispense Refill  . amLODipine-benazepril (LOTREL) 10-40 MG capsule TAKE 1 CAPSULE BY MOUTH EVERY DAY 90 capsule 3  . aspirin 81 MG EC tablet Take 81 mg by mouth daily.      . Blood Glucose Monitoring Suppl (ONE TOUCH ULTRA 2) w/Device KIT Use as directed  E11.9 1 each 0  . BYSTOLIC 10 MG tablet TAKE 2 TABLETS (20 MG TOTAL) BY MOUTH DAILY. MUST KEEP SCHEDULED APPT FOR FUTURE REFILLS 60 tablet 1  . Docusate Calcium ([employer] STOOL SOFTENER PO) Take 2 tablets by mouth 2 (two) times daily.     . hydrALAZINE (APRESOLINE) 50 MG tablet Take 1 tablet (50 mg total) by mouth 3 (three) times daily. 270 tablet 1  . hydrochlorothiazide (HYDRODIURIL) 25 MG tablet Take 1 tablet (25 mg total) by mouth daily. 90 tablet 3  . ibuprofen (ADVIL,MOTRIN) 200 MG tablet Take  400 mg by mouth every 6 (six) hours as needed. Reported on 06/01/2015    . JANUVIA 100 MG tablet TAKE 1 TABLET BY MOUTH EVERY DAY 90 tablet 0  . Lancets MISC Use as directed E11.9 100 each 11  . metFORMIN (GLUCOPHAGE-XR) 500 MG 24 hr tablet TAKE 2 TABLETS BY MOUTH EVERY DAY WITH BREAKFAST 180 tablet 0  . naproxen (NAPROSYN) 500 MG tablet Take 1 tablet (500 mg total) by mouth 2 (two) times daily. 20 tablet 0  . omeprazole (PRILOSEC) 40 MG capsule Take 1 capsule (40 mg total) by mouth daily. 90 capsule 3  . oxaprozin (DAYPRO) 600  MG tablet TAKE 1 TABLET BY MOUTH TWICE A DAY 60 tablet 0  . potassium chloride (KLOR-CON M10) 10 MEQ tablet Take 2 tablets (20 mEq total) by mouth daily. Must keep scheduled appt in June for future refills 60 tablet 1  . pravastatin (PRAVACHOL) 40 MG tablet Take 1 tablet (40 mg total) by mouth daily. 90 tablet 1   No current facility-administered medications on file prior to visit.   Review of Systems All otherwise neg per pt    Objective:   Physical Exam BP 120/78 (BP Location: Left Arm, Patient Position: Sitting, Cuff Size: Large)   Pulse 72   Temp 98.7 F (37.1 C) (Oral)   Ht 6' 1"  (1.854 m)   Wt 177 lb (80.3 kg)   SpO2 96%   BMI 23.35 kg/m  VS noted,  Constitutional: Pt appears in NAD HENT: Head: NCAT.  Right Ear: External ear normal.  Left Ear: External ear normal.  Eyes: . Pupils are equal, round, and reactive to light. Conjunctivae and EOM are normal Nose: without d/c or deformity Neck: Neck supple. Gross normal ROM Cardiovascular: Normal rate and regular rhythm.   Pulmonary/Chest: Effort normal and breath sounds without rales or wheezing.  Abd:  Soft, NT, ND, + BS, no organomegaly Neurological: Pt is alert. At baseline orientation, motor grossly intact Skin: Skin is warm. No rashes, other new lesions, no LE edema Psychiatric: Pt behavior is normal without agitation  All otherwise neg per pt Lab Results  Component Value Date   WBC 4.9 01/27/2020   HGB 13.3 01/27/2020   HCT 39.4 01/27/2020   PLT 233.0 01/27/2020   GLUCOSE 93 01/27/2020   CHOL 162 01/27/2020   TRIG 58.0 01/27/2020   HDL 64.30 01/27/2020   LDLDIRECT 117.9 01/14/2008   LDLCALC 86 01/27/2020   ALT 14 01/27/2020   AST 18 01/27/2020   NA 141 01/27/2020   K 3.1 (L) 01/27/2020   CL 100 01/27/2020   CREATININE 1.28 01/27/2020   BUN 16 01/27/2020   CO2 32 01/27/2020   TSH 1.03 08/02/2019   PSA 0.40 08/02/2019   INR 1.09 08/05/2010   HGBA1C 5.8 01/27/2020   MICROALBUR 20.7 (H) 08/02/2019        Assessment & Plan:

## 2020-02-19 ENCOUNTER — Encounter: Payer: Self-pay | Admitting: Internal Medicine

## 2020-02-19 NOTE — Assessment & Plan Note (Addendum)
stable overall by history and exam, recent data reviewed with pt, and pt to continue medical treatment as before,  to f/u any worsening symptoms or concerns  I spent 31 minutes in preparing to see the patient by review of recent labs, imaging and procedures, obtaining and reviewing separately obtained history, communicating with the patient and family or caregiver, ordering medications, tests or procedures, and documenting clinical information in the EHR including the differential Dx, treatment, and any further evaluation and other management of dm, htn, hld, renal insufficiency

## 2020-02-19 NOTE — Assessment & Plan Note (Signed)
For f/u lab, avoid nephrotoxins

## 2020-02-19 NOTE — Assessment & Plan Note (Signed)
stable overall by history and exam, recent data reviewed with pt, and pt to continue medical treatment as before,  to f/u any worsening symptoms or concerns  

## 2020-02-20 ENCOUNTER — Other Ambulatory Visit: Payer: Self-pay | Admitting: Internal Medicine

## 2020-02-20 NOTE — Telephone Encounter (Signed)
Please refill as per office routine med refill policy (all routine meds refilled for 3 mo or monthly per pt preference up to one year from last visit, then month to month grace period for 3 mo, then further med refills will have to be denied)  

## 2020-02-26 ENCOUNTER — Other Ambulatory Visit: Payer: Self-pay | Admitting: Internal Medicine

## 2020-02-26 NOTE — Telephone Encounter (Signed)
Please refill as per office routine med refill policy (all routine meds refilled for 3 mo or monthly per pt preference up to one year from last visit, then month to month grace period for 3 mo, then further med refills will have to be denied)  

## 2020-03-20 ENCOUNTER — Other Ambulatory Visit: Payer: Self-pay | Admitting: Internal Medicine

## 2020-03-20 NOTE — Telephone Encounter (Signed)
Please refill as per office routine med refill policy (all routine meds refilled for 3 mo or monthly per pt preference up to one year from last visit, then month to month grace period for 3 mo, then further med refills will have to be denied)  

## 2020-04-28 ENCOUNTER — Other Ambulatory Visit: Payer: Self-pay | Admitting: Internal Medicine

## 2020-04-28 NOTE — Telephone Encounter (Signed)
Please refill as per office routine med refill policy (all routine meds refilled for 3 mo or monthly per pt preference up to one year from last visit, then month to month grace period for 3 mo, then further med refills will have to be denied)  

## 2020-05-24 ENCOUNTER — Other Ambulatory Visit: Payer: Self-pay | Admitting: Internal Medicine

## 2020-05-24 NOTE — Telephone Encounter (Signed)
Please refill as per office routine med refill policy (all routine meds refilled for 3 mo or monthly per pt preference up to one year from last visit, then month to month grace period for 3 mo, then further med refills will have to be denied)  

## 2020-06-13 ENCOUNTER — Other Ambulatory Visit: Payer: Self-pay | Admitting: Internal Medicine

## 2020-06-13 NOTE — Telephone Encounter (Signed)
Please refill as per office routine med refill policy (all routine meds refilled for 3 mo or monthly per pt preference up to one year from last visit, then month to month grace period for 3 mo, then further med refills will have to be denied)  

## 2020-06-16 ENCOUNTER — Other Ambulatory Visit: Payer: Self-pay | Admitting: Internal Medicine

## 2020-06-16 NOTE — Telephone Encounter (Signed)
Please refill as per office routine med refill policy (all routine meds refilled for 3 mo or monthly per pt preference up to one year from last visit, then month to month grace period for 3 mo, then further med refills will have to be denied)  

## 2020-07-02 ENCOUNTER — Other Ambulatory Visit: Payer: Self-pay | Admitting: Internal Medicine

## 2020-07-02 NOTE — Telephone Encounter (Signed)
Please refill as per office routine med refill policy (all routine meds refilled for 3 mo or monthly per pt preference up to one year from last visit, then month to month grace period for 3 mo, then further med refills will have to be denied)  

## 2020-07-03 ENCOUNTER — Other Ambulatory Visit: Payer: Self-pay | Admitting: Internal Medicine

## 2020-07-03 NOTE — Telephone Encounter (Signed)
Please refill as per office routine med refill policy (all routine meds refilled for 3 mo or monthly per pt preference up to one year from last visit, then month to month grace period for 3 mo, then further med refills will have to be denied)  

## 2020-07-12 ENCOUNTER — Other Ambulatory Visit: Payer: Self-pay | Admitting: Internal Medicine

## 2020-07-12 NOTE — Telephone Encounter (Signed)
Please refill as per office routine med refill policy (all routine meds refilled for 3 mo or monthly per pt preference up to one year from last visit, then month to month grace period for 3 mo, then further med refills will have to be denied)  

## 2020-07-13 ENCOUNTER — Encounter: Payer: Self-pay | Admitting: Internal Medicine

## 2020-07-17 ENCOUNTER — Encounter: Payer: Self-pay | Admitting: Podiatry

## 2020-07-17 ENCOUNTER — Other Ambulatory Visit: Payer: Self-pay

## 2020-07-17 ENCOUNTER — Telehealth: Payer: Self-pay | Admitting: *Deleted

## 2020-07-17 ENCOUNTER — Ambulatory Visit: Payer: 59 | Admitting: Podiatry

## 2020-07-17 DIAGNOSIS — M205X2 Other deformities of toe(s) (acquired), left foot: Secondary | ICD-10-CM

## 2020-07-17 DIAGNOSIS — M779 Enthesopathy, unspecified: Secondary | ICD-10-CM | POA: Diagnosis not present

## 2020-07-17 MED ORDER — OXAPROZIN 600 MG PO TABS
600.0000 mg | ORAL_TABLET | Freq: Two times a day (BID) | ORAL | 0 refills | Status: DC
Start: 2020-07-17 — End: 2020-07-21

## 2020-07-17 NOTE — Telephone Encounter (Signed)
Called patient to inform that a prescription for Daypro-600 mg was sent to his pharmacy on file and that Dr Prudence Davidson would like for him to discontinue the Naproxen-500 mg since they are duplicate therapies. The patient verbalized understanding and wanted to know about the inserts that were discussed during office visit.I told him that he may pick/pay for them at his convenience from front desk.

## 2020-07-17 NOTE — Progress Notes (Addendum)
This patient presents to the office with chief complaint of burning under his big toe joint both feet.  He says the burning has been present for the last 4 weeks.  He has previously been treated for hallux limitus previously.  Patient has been diagnosed with renal insufficiency.  He was treated for acute capsulitis successfully with Daypro.  Patient points to the area under the big toe joint  both feet.  He presents to the office for evaluation and treatment.  Vascular  Dorsalis pedis and posterior tibial pulses are palpable  B/L.  Capillary return  WNL.  Temperature gradient is  WNL.  Skin turgor  WNL  Sensorium  Senn Weinstein monofilament wire  WNL. Normal tactile sensation.  Nail Exam  Patient has normal nails with no evidence of bacterial or fungal infection.  Orthopedic  Exam  Muscle tone and muscle strength  WNL.  No limitations of motion feet  B/L.  No crepitus or joint effusion noted.  Foot type is unremarkable and digits show no abnormalities.  Bony prominences are unremarkable.  ROM 1st MPJ right about 35degrees.  ROM 1st MPJ about 25 degrees.  No motion noted upon loading  1st MPJ  B/L.  Skin  No open lesions.  Normal skin texture and turgor.  Hallux limitus 1st MPJ  B/L.  ROV.  Discussed this condition with this patient.  Prescribed Daypro  600mg . Told him he would benefit from kinetic wedge orthoses.  RTC 4 weeks   Gardiner Barefoot [facility]   07/18/20  Patient was told to return to the office for application of padding for hallux limitus.  Patient also says he has no kidney disease .  Told him to take one Daypro  Daily.  RTC 6 weeks at his appointed time and we will reevaluate his progress.  To consider kinetic wedge orthoses.   Gardiner Barefoot DPM

## 2020-07-18 NOTE — Telephone Encounter (Signed)
Returned call to patient , no answer, left Vmessage.

## 2020-07-18 NOTE — Telephone Encounter (Signed)
Please to ask if pt is able to check bp at home at least once per day and let us know results in 10 days

## 2020-07-19 ENCOUNTER — Encounter: Payer: Self-pay | Admitting: Podiatry

## 2020-07-19 NOTE — Telephone Encounter (Signed)
See note on 4/19.  Gardiner Barefoot DPM

## 2020-07-21 ENCOUNTER — Other Ambulatory Visit: Payer: Self-pay | Admitting: Podiatry

## 2020-07-21 MED ORDER — MELOXICAM 15 MG PO TABS: 15.0000 mg | ORAL_TABLET | Freq: Every day | ORAL | 0 refills | Status: DC

## 2020-07-21 NOTE — Progress Notes (Signed)
Patient called on call stating he is still having pain and his medication is not helping. Stop Daypro start meloxicam instead. Sent to pharmacy. Would consider injection when patient comes to the office.

## 2020-07-23 ENCOUNTER — Ambulatory Visit (INDEPENDENT_AMBULATORY_CARE_PROVIDER_SITE_OTHER): Payer: 59 | Admitting: Podiatry

## 2020-07-23 ENCOUNTER — Other Ambulatory Visit: Payer: Self-pay | Admitting: Internal Medicine

## 2020-07-23 ENCOUNTER — Encounter: Payer: Self-pay | Admitting: Podiatry

## 2020-07-23 ENCOUNTER — Other Ambulatory Visit: Payer: Self-pay

## 2020-07-23 DIAGNOSIS — M205X2 Other deformities of toe(s) (acquired), left foot: Secondary | ICD-10-CM

## 2020-07-23 DIAGNOSIS — M779 Enthesopathy, unspecified: Secondary | ICD-10-CM

## 2020-07-23 NOTE — Progress Notes (Signed)
This patient presents to the office with chief complaint of a hot swollen right foot.  Patient was seen by myself previously and was given Daypro and kinetic wedge dispersion pads for his feet.  He he was treated for burning in his big toe joint with these 2 treatments.  He says that come last Friday his whole right foot became swollen inflamed and painful and he was unable to walk on his right foot.  He called to the office and talk to the doctor on-call who change his medication from Daypro to Mobic.  He says he is taking the Mobic for the last 2 days and feels that there is improvement in his right foot.  He returns to the office today for continued evaluation and treatment of his feet.  He is scheduled for casting for kinetic wedge orthoses tomorrow.  Vascular  Dorsalis pedis and posterior tibial pulses are palpable  B/L.  Capillary return  WNL.  Temperature gradient is  WNL.  Skin turgor  WNL  Sensorium  Senn Weinstein monofilament wire  WNL. Normal tactile sensation.  Nail Exam  Patient has normal nails with no evidence of bacterial or fungal infection.  Orthopedic  Exam  Muscle tone and muscle strength  WNL.  No limitations of motion feet  B/L.  No crepitus or joint effusion noted.  Foot type is unremarkable and digits show no abnormalities.  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  Patient has red swollen hot right foot.  Pain nored 1st MPJ right foot.  His left foot is normal temperature.  Skin  No open lesions.  Normal skin texture and turgor.  Acute arthritic flare-up right foot.  ROV.  Discussed this condition with this patient.  Patient desires to continue with the Mobic for the next few days and watch his right foot.  We discussed treating him with a Medrol Dosepak and injection therapy in the future.  Patient also is a candidate for surgery for the correction of a hallux limitus deformity first MPJ bilaterally.  Patient was given a note for him to be off from work from last Friday to this coming  Monday.  Patient to return to the office in 1 week for further evaluation and treatment   Gardiner Barefoot DPM

## 2020-07-23 NOTE — Telephone Encounter (Signed)
Please refill as per office routine med refill policy (all routine meds refilled for 3 mo or monthly per pt preference up to one year from last visit, then month to month grace period for 3 mo, then further med refills will have to be denied)  

## 2020-07-24 ENCOUNTER — Encounter: Payer: Self-pay | Admitting: Podiatry

## 2020-07-24 ENCOUNTER — Ambulatory Visit (INDEPENDENT_AMBULATORY_CARE_PROVIDER_SITE_OTHER): Payer: 59 | Admitting: Podiatry

## 2020-07-24 DIAGNOSIS — M205X2 Other deformities of toe(s) (acquired), left foot: Secondary | ICD-10-CM

## 2020-07-24 DIAGNOSIS — M779 Enthesopathy, unspecified: Secondary | ICD-10-CM

## 2020-07-24 NOTE — Progress Notes (Addendum)
Patient presents to be casted for orthotics.  A foam impression was casted for his right and left foot  Patient is a size 11  Patient will be contacted when the orthotics are ready for pick up  Gardiner Barefoot DPM

## 2020-08-14 ENCOUNTER — Other Ambulatory Visit: Payer: Self-pay | Admitting: *Deleted

## 2020-08-14 ENCOUNTER — Other Ambulatory Visit: Payer: Self-pay | Admitting: Podiatry

## 2020-08-14 MED ORDER — MELOXICAM 15 MG PO TABS
15.0000 mg | ORAL_TABLET | Freq: Every day | ORAL | 0 refills | Status: DC
Start: 2020-08-14 — End: 2021-04-23

## 2020-08-14 NOTE — Telephone Encounter (Signed)
Please advise 

## 2020-08-21 ENCOUNTER — Other Ambulatory Visit: Payer: Self-pay

## 2020-08-21 ENCOUNTER — Encounter: Payer: Self-pay | Admitting: Podiatry

## 2020-08-21 ENCOUNTER — Ambulatory Visit: Payer: 59 | Admitting: Podiatry

## 2020-08-21 DIAGNOSIS — M779 Enthesopathy, unspecified: Secondary | ICD-10-CM

## 2020-08-21 DIAGNOSIS — M205X2 Other deformities of toe(s) (acquired), left foot: Secondary | ICD-10-CM

## 2020-08-21 NOTE — Progress Notes (Signed)
Patient presents to the office to pick up his kinetic wedge orthoses.   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  Tx.  ROV.  Fit orthoses to his feet.  Gave him break-in instructions.  RTC prn.   Gardiner Barefoot DPM

## 2020-08-23 ENCOUNTER — Other Ambulatory Visit: Payer: Self-pay | Admitting: Internal Medicine

## 2020-08-23 NOTE — Telephone Encounter (Signed)
Please refill as per office routine med refill policy (all routine meds refilled for 3 mo or monthly per pt preference up to one year from last visit, then month to month grace period for 3 mo, then further med refills will have to be denied)  

## 2020-09-10 ENCOUNTER — Ambulatory Visit: Payer: 59 | Admitting: Physician Assistant

## 2020-09-10 ENCOUNTER — Other Ambulatory Visit (INDEPENDENT_AMBULATORY_CARE_PROVIDER_SITE_OTHER): Payer: 59

## 2020-09-10 ENCOUNTER — Other Ambulatory Visit: Payer: Self-pay

## 2020-09-10 ENCOUNTER — Encounter: Payer: Self-pay | Admitting: Physician Assistant

## 2020-09-10 VITALS — BP 120/80 | HR 62 | Ht 73.0 in | Wt 179.2 lb

## 2020-09-10 DIAGNOSIS — E0800 Diabetes mellitus due to underlying condition with hyperosmolarity without nonketotic hyperglycemic-hyperosmolar coma (NKHHC): Secondary | ICD-10-CM

## 2020-09-10 DIAGNOSIS — I10 Essential (primary) hypertension: Secondary | ICD-10-CM | POA: Diagnosis not present

## 2020-09-10 DIAGNOSIS — E785 Hyperlipidemia, unspecified: Secondary | ICD-10-CM

## 2020-09-10 DIAGNOSIS — N289 Disorder of kidney and ureter, unspecified: Secondary | ICD-10-CM

## 2020-09-10 DIAGNOSIS — Z794 Long term (current) use of insulin: Secondary | ICD-10-CM | POA: Diagnosis not present

## 2020-09-10 DIAGNOSIS — E119 Type 2 diabetes mellitus without complications: Secondary | ICD-10-CM

## 2020-09-10 DIAGNOSIS — Z0000 Encounter for general adult medical examination without abnormal findings: Secondary | ICD-10-CM

## 2020-09-10 LAB — LIPID PANEL
Cholesterol: 157 mg/dL (ref 0–200)
HDL: 50.2 mg/dL (ref >39.00)
LDL Cholesterol: 87 mg/dL (ref 0–99)
NonHDL: 106.73
Total CHOL/HDL Ratio: 3
Triglycerides: 98 mg/dL (ref 0.0–149.0)
VLDL: 19.6 mg/dL (ref 0.0–40.0)

## 2020-09-10 LAB — PSA: PSA: 0.39 ng/mL (ref 0.10–4.00)

## 2020-09-10 LAB — CBC WITH DIFFERENTIAL/PLATELET
Basophils Absolute: 0 10*3/uL (ref 0.0–0.1)
Basophils Relative: 0.5 % (ref 0.0–3.0)
Eosinophils Absolute: 0.2 10*3/uL (ref 0.0–0.7)
Eosinophils Relative: 2.2 % (ref 0.0–5.0)
HCT: 40.5 % (ref 39.0–52.0)
Hemoglobin: 13.8 g/dL (ref 13.0–17.0)
Lymphocytes Relative: 21.9 % (ref 12.0–46.0)
Lymphs Abs: 1.5 10*3/uL (ref 0.7–4.0)
MCHC: 34.2 g/dL (ref 30.0–36.0)
MCV: 96.7 fl (ref 78.0–100.0)
Monocytes Absolute: 1.1 10*3/uL — ABNORMAL HIGH (ref 0.1–1.0)
Monocytes Relative: 16.5 % — ABNORMAL HIGH (ref 3.0–12.0)
Neutro Abs: 4.1 10*3/uL (ref 1.4–7.7)
Neutrophils Relative %: 58.9 % (ref 43.0–77.0)
Platelets: 258 10*3/uL (ref 150.0–400.0)
RBC: 4.19 Mil/uL — ABNORMAL LOW (ref 4.22–5.81)
RDW: 13.2 % (ref 11.5–15.5)
WBC: 6.9 10*3/uL (ref 4.0–10.5)

## 2020-09-10 LAB — BASIC METABOLIC PANEL
BUN: 17 mg/dL (ref 6–23)
CO2: 30 mEq/L (ref 19–32)
Calcium: 9.6 mg/dL (ref 8.4–10.5)
Chloride: 99 mEq/L (ref 96–112)
Creatinine, Ser: 1.29 mg/dL (ref 0.40–1.50)
GFR: 59.93 mL/min — ABNORMAL LOW (ref >60.00)
Glucose, Bld: 98 mg/dL (ref 70–99)
Potassium: 3.1 mEq/L — ABNORMAL LOW (ref 3.5–5.1)
Sodium: 141 mEq/L (ref 135–145)

## 2020-09-10 LAB — HEMOGLOBIN A1C: Hgb A1c MFr Bld: 6.1 % (ref 4.6–6.5)

## 2020-09-10 LAB — URINALYSIS, ROUTINE W REFLEX MICROSCOPIC
Bilirubin Urine: NEGATIVE
Hgb urine dipstick: NEGATIVE
Ketones, ur: NEGATIVE
Leukocytes,Ua: NEGATIVE
Nitrite: NEGATIVE
Specific Gravity, Urine: 1.025 (ref 1.000–1.030)
Total Protein, Urine: NEGATIVE
Urine Glucose: NEGATIVE
Urobilinogen, UA: 1 (ref 0.0–1.0)
pH: 6 (ref 5.0–8.0)

## 2020-09-10 LAB — MICROALBUMIN / CREATININE URINE RATIO
Creatinine,U: 183.9 mg/dL
Microalb Creat Ratio: 1.3 mg/g (ref 0.0–30.0)
Microalb, Ur: 2.5 mg/dL — ABNORMAL HIGH (ref 0.0–1.9)

## 2020-09-10 LAB — HEPATIC FUNCTION PANEL
ALT: 12 U/L (ref 0–53)
AST: 14 U/L (ref 0–37)
Albumin: 4.5 g/dL (ref 3.5–5.2)
Alkaline Phosphatase: 70 U/L (ref 39–117)
Bilirubin, Direct: 0.1 mg/dL (ref 0.0–0.3)
Total Bilirubin: 0.7 mg/dL (ref 0.2–1.2)
Total Protein: 7.2 g/dL (ref 6.0–8.3)

## 2020-09-10 LAB — TSH: TSH: 0.28 u[IU]/mL — ABNORMAL LOW (ref 0.35–4.50)

## 2020-09-10 LAB — VITAMIN D 25 HYDROXY (VIT D DEFICIENCY, FRACTURES): VITD: 47.93 ng/mL (ref 30.00–100.00)

## 2020-09-10 NOTE — Patient Instructions (Signed)
Medication Instructions:  Your physician recommends that you continue on your current medications as directed. Please refer to the Current Medication list given to you today.  *If you need a refill on your cardiac medications before your next appointment, please call your pharmacy*  Lab Work: NONE ordered at this time of appointment   If you have labs (blood work) drawn today and your tests are completely normal, you will receive your results only by: [place] (if you have MyChart) OR A paper copy in the mail If you have any lab test that is abnormal or we need to change your treatment, we will call you to review the results.  Testing/Procedures: NONE ordered at this time of appointment   Follow-Up: At [facility], you and your health needs are our priority.  As part of our continuing mission to provide you with exceptional heart care, we have created designated Provider Care Teams.  These Care Teams include your primary Cardiologist (physician) and Advanced Practice Providers (APPs -  Physician Assistants and Nurse Practitioners) who all work together to provide you with the care you need, when you need it.  Your next appointment:   12 month(s)  The format for your next appointment:   In Person  Provider:   Quay Burow, MD  Other Instructions

## 2020-09-10 NOTE — Progress Notes (Signed)
Cardiology Office Note:    Date:  09/10/2020   ID:  [name], DOB 26-Aug-1960, MRN [id number]  PCP:  Biagio Borg, MD   [facility] Providers Cardiologist:  Quay Burow, MD     Referring MD: Biagio Borg, MD   Chief Complaint  Patient presents with   Follow-up    Seen for Dr. Gwenlyn Found     History of Present Illness:    [name] is a 60 y.o. male with a hx of hypertension, hyperlipidemia, DM 2, tobacco abuse, OSA and history of normal coronary arteries on cath 10/23/2008.  Echocardiogram obtained on 02/13/2012 showed EF greater than 55%, moderate LVH, RV systolic pressure elevated 30 to 40 mmHg.  Last lipid panel obtained in October 2021 showed total cholesterol 162, HDL 64, LDL 86, triglyceride 58.   Patient presents today for 1 year follow-up.  He denies any recent exertional chest discomfort or worsening dyspnea.  He has no lower extremity edema, orthopnea or PND.  Recent cholesterol seems to be very well controlled.  He denies any claudication symptoms.  Blood pressure and heart rate are very well controlled.  Patient can follow-up in 1 year    Past Medical History:  Diagnosis Date   Chest pain 02/13/2012   echo - EF >55%;mod concentric L ventricular hypertrophy; atrial septum aneurysmal; patent foramen ovale suspected on color flow, LA mil/mod dilation; RV systolic pressure [id number]   Chest pain 06/09/2007   echo - EF 45-50%; interarterial shunt; mild valvular regurgitation;    Chest pain 06/09/2007   Myoview - EF 53%; normal perfusion all regions;EKG negative for ischemia    Diabetes mellitus    type II   DIABETES MELLITUS, TYPE II 08/29/2009   GERD (gastroesophageal reflux disease) 08/12/2010   Hemorrhoids    History of anal fissures    Hyperlipidemia    HYPERLIPIDEMIA 08/29/2009   Hypertension    HYPERTENSION 01/14/2008   Lightheadedness 05/10/2002   30d monitor unremarkable   LVH (left ventricular hypertrophy)    with systolic dysfunction by echo  10/03, EF 45-50% - Dr. Melvern Banker   Multinodular goiter 12/06/2015   Nonspecific ST-T wave electrocardiographic changes 08/11/2006   Myoview - EF 49%; normal perfusion all regions, EKG negative for ischemia   OSA (obstructive sleep apnea)    Preventative health care 08/11/2010   Sleep apnea 08/13/2008   AHI 1.2; sleep study 10/2007 - AHI 9.47at 15cm H2O and 31.3 at [place], OBSTRUCTIVE 01/14/2008    Past Surgical History:  Procedure Laterality Date   ANAL SPHINCTEROTOMY  08/20/11   CARDIAC CATHETERIZATION  10/23/2008   normal coronary arteries and LV function   COLONOSCOPY     POLYPECTOMY      Current Medications: Current Meds  Medication Sig   amLODipine-benazepril (LOTREL) 10-40 MG capsule TAKE 1 CAPSULE BY MOUTH EVERY DAY   aspirin 81 MG EC tablet Take 81 mg by mouth daily.   Blood Glucose Monitoring Suppl (ONE TOUCH ULTRA 2) w/Device KIT Use as directed  E11.9   Docusate Calcium ([employer] STOOL SOFTENER PO) Take 2 tablets by mouth 2 (two) times daily.   hydrALAZINE (APRESOLINE) 50 MG tablet TAKE 1 TABLET BY MOUTH THREE TIMES A DAY   hydrochlorothiazide (HYDRODIURIL) 25 MG tablet TAKE 1 TABLET BY MOUTH EVERY DAY   ibuprofen (ADVIL,MOTRIN) 200 MG tablet Take 400 mg by mouth every 6 (six) hours as needed. Reported on 06/01/2015   JANUVIA 100 MG tablet TAKE 1 TABLET BY MOUTH  EVERY DAY   Lancets MISC Use as directed E11.9   meloxicam (MOBIC) 15 MG tablet Take 1 tablet (15 mg total) by mouth daily.   metFORMIN (GLUCOPHAGE-XR) 500 MG 24 hr tablet TAKE 2 TABLETS BY MOUTH EVERY DAY WITH BREAKFAST   nebivolol (BYSTOLIC) 10 MG tablet TAKE 2 TABLETS (20 MG TOTAL) BY MOUTH DAILY. MUST KEEP SCHEDULED APPT FOR FUTURE REFILLS   omeprazole (PRILOSEC) 40 MG capsule Take 1 capsule (40 mg total) by mouth daily.   potassium chloride (KLOR-CON M10) 10 MEQ tablet Take 2 tablets (20 mEq total) by mouth daily. Must keep scheduled appt in June for future refills   pravastatin (PRAVACHOL) 40 MG tablet TAKE  1 TABLET BY MOUTH EVERY DAY     Allergies:   Sulfa antibiotics and Vicodin [hydrocodone-acetaminophen]   Social History   Socioeconomic History   Marital status: Single    Spouse name: Not on file   Number of children: 1   Years of education: Not on file   Highest education level: Not on file  Occupational History   Occupation: work as truck Education administrator: [employer]  Tobacco Use   Smoking status: Every Day    Packs/day: 0.50    Pack years: 0.00    Types: Cigarettes   Smokeless tobacco: Never  Substance and Sexual Activity   Alcohol use: Yes    Alcohol/week: 3.0 standard drinks    Types: 3 Standard drinks or equivalent per week    Comment: occaisionally   Drug use: No   Sexual activity: Not on file  Other Topics Concern   Not on file  Social History Narrative   Not on file   Social Determinants of Health   Financial Resource Strain: Not on file  Food Insecurity: Not on file  Transportation Needs: Not on file  Physical Activity: Not on file  Stress: Not on file  Social Connections: Not on file     Family History: The patient's family history includes Heart disease in his father; Hypertension in his mother. There is no history of Colon cancer or Thyroid disease.  ROS:   Please see the history of present illness.     All other systems reviewed and are negative.  EKGs/Labs/Other Studies Reviewed:    The following studies were reviewed today:  Echo 02/13/2012 EF 55%, moderate LVH  EKG:  EKG is ordered today.  The ekg ordered today demonstrates normal sinus rhythm, no significant ST-T wave changes  Recent Labs: 01/27/2020: ALT 14; BUN 16; Creatinine, Ser 1.28; Hemoglobin 13.3; Platelets 233.0; Potassium 3.1; Sodium 141  Recent Lipid Panel    Component Value Date/Time   CHOL 162 01/27/2020 0822   TRIG 58.0 01/27/2020 0822   HDL 64.30 01/27/2020 0822   CHOLHDL 3 01/27/2020 0822   VLDL 11.6 01/27/2020 0822   LDLCALC 86 01/27/2020 0822    LDLDIRECT 117.9 01/14/2008 1641     Risk Assessment/Calculations:       Physical Exam:    VS:  BP 120/80   Pulse 62   Ht 6' 1"  (1.854 m)   Wt 179 lb 3.2 oz (81.3 kg)   BMI 23.64 kg/m     Wt Readings from Last 3 Encounters:  09/10/20 179 lb 3.2 oz (81.3 kg)  02/13/20 177 lb (80.3 kg)  12/20/19 175 lb 12.8 oz (79.7 kg)     GEN:  Well nourished, well developed in no acute distress HEENT: Normal NECK: No JVD; No carotid bruits LYMPHATICS: No lymphadenopathy  CARDIAC: RRR, no murmurs, rubs, gallops RESPIRATORY:  Clear to auscultation without rales, wheezing or rhonchi  ABDOMEN: Soft, non-tender, non-distended MUSCULOSKELETAL:  No edema; No deformity  SKIN: Warm and dry NEUROLOGIC:  Alert and oriented x 3 PSYCHIATRIC:  Normal affect   ASSESSMENT:    1. Essential hypertension   2. Hyperlipidemia LDL goal <100   3. Controlled type 2 diabetes mellitus without complication, without long-term current use of insulin (HCC)    PLAN:    In order of problems listed above:  Hypertension: Blood pressure well controlled on current therapy.  On amlodipine-benazepril, hydrochlorothiazide, hydralazine, and nebivolol  Hyperlipidemia: Continue pravastatin  DM2: Managed by primary care provider.        Medication Adjustments/Labs and Tests Ordered: Current medicines are reviewed at length with the patient today.  Concerns regarding medicines are outlined above.  Orders Placed This Encounter  Procedures   EKG 12-Lead   No orders of the defined types were placed in this encounter.   Patient Instructions  Medication Instructions:  Your physician recommends that you continue on your current medications as directed. Please refer to the Current Medication list given to you today.  *If you need a refill on your cardiac medications before your next appointment, please call your pharmacy*  Lab Work: NONE ordered at this time of appointment   If you have labs (blood work) drawn  today and your tests are completely normal, you will receive your results only by: [name] (if you have MyChart) OR A paper copy in the mail If you have any lab test that is abnormal or we need to change your treatment, we will call you to review the results.  Testing/Procedures: NONE ordered at this time of appointment   Follow-Up: At [facility], you and your health needs are our priority.  As part of our continuing mission to provide you with exceptional heart care, we have created designated Provider Care Teams.  These Care Teams include your primary Cardiologist (physician) and Advanced Practice Providers (APPs -  Physician Assistants and Nurse Practitioners) who all work together to provide you with the care you need, when you need it.  Your next appointment:   12 month(s)  The format for your next appointment:   In Person  Provider:   Quay Burow, MD  Other Instructions    Signed, Almyra Deforest, [place]  09/10/2020 8:46 AM    [place]

## 2020-09-11 ENCOUNTER — Other Ambulatory Visit: Payer: Self-pay | Admitting: Internal Medicine

## 2020-09-11 ENCOUNTER — Ambulatory Visit (INDEPENDENT_AMBULATORY_CARE_PROVIDER_SITE_OTHER): Payer: 59 | Admitting: Internal Medicine

## 2020-09-11 ENCOUNTER — Encounter: Payer: Self-pay | Admitting: Internal Medicine

## 2020-09-11 VITALS — BP 120/78 | HR 62 | Temp 98.3°F | Ht 73.0 in | Wt 178.8 lb

## 2020-09-11 DIAGNOSIS — Z23 Encounter for immunization: Secondary | ICD-10-CM | POA: Diagnosis not present

## 2020-09-11 DIAGNOSIS — Z1211 Encounter for screening for malignant neoplasm of colon: Secondary | ICD-10-CM

## 2020-09-11 DIAGNOSIS — E782 Mixed hyperlipidemia: Secondary | ICD-10-CM | POA: Diagnosis not present

## 2020-09-11 DIAGNOSIS — Z720 Tobacco use: Secondary | ICD-10-CM

## 2020-09-11 DIAGNOSIS — Z0001 Encounter for general adult medical examination with abnormal findings: Secondary | ICD-10-CM | POA: Diagnosis not present

## 2020-09-11 DIAGNOSIS — I10 Essential (primary) hypertension: Secondary | ICD-10-CM

## 2020-09-11 DIAGNOSIS — Z794 Long term (current) use of insulin: Secondary | ICD-10-CM

## 2020-09-11 DIAGNOSIS — E0800 Diabetes mellitus due to underlying condition with hyperosmolarity without nonketotic hyperglycemic-hyperosmolar coma (NKHHC): Secondary | ICD-10-CM

## 2020-09-11 MED ORDER — PRAVASTATIN SODIUM 80 MG PO TABS: 80.0000 mg | ORAL_TABLET | Freq: Every day | ORAL | 3 refills | Status: DC

## 2020-09-11 MED ORDER — HYDRALAZINE HCL 50 MG PO TABS: 50.0000 mg | ORAL_TABLET | Freq: Three times a day (TID) | ORAL | 3 refills | Status: DC

## 2020-09-11 NOTE — Patient Instructions (Addendum)
You had Prevnar 13 pneumonia shot today  You had the Shingrix shingles shot #1 today  You will be contacted regarding the referral for: colonoscopy, and eye doctor  Ok to increase the pravastatin to 80 mg per day  Please continue all other medications as before, and refills have been done if requested.  Please have the pharmacy call with any other refills you may need.  Please continue your efforts at being more active, low cholesterol diet, and weight control.  You are otherwise up to date with prevention measures today.  Please keep your appointments with your specialists as you may have planned  Please make an Appointment to return in 6 months, or sooner if needed, also with Lab Appointment for testing done 3-5 days before at the [facility] (so this is for TWO appointments - please see the scheduling desk as you leave)  Due to the ongoing Covid 19 pandemic, our lab now requires an appointment for any labs done at our office.  If you need labs done and do not have an appointment, please call our office ahead of time to schedule before presenting to the lab for your testing.

## 2020-09-11 NOTE — Assessment & Plan Note (Signed)
Counseled to quit, pt not ready 

## 2020-09-11 NOTE — Assessment & Plan Note (Addendum)
Age and sex appropriate education and counseling updated with regular exercise and diet Referrals for preventative services - for colonoscopy, and optho referrals Immunizations addressed - for shingrix and prevnar 13 Smoking counseling  - counseled to quit, pt not ready Evidence for depression or other mood disorder - none significant Most recent labs reviewed. I have personally reviewed and have noted: 1) the patient's medical and social history 2) The patient's current medications and supplements 3) The patient's height, weight, and BMI have been recorded in the chart

## 2020-09-11 NOTE — Progress Notes (Signed)
Patient ID: [name], male   DOB: Jun 29, 1960, 60 y.o.   MRN: [id number]         Chief Complaint:: wellness exam and Dm, HLD, HTN       HPI:  [name] is a 60 y.o. male here for wellness exam; due for shignrix, prevnar 13,, colonosocpy, and yearly optho exam, o/w up to date with preventive referrals and immunizations                        Also trying to follow lower chol diet, Pt denies chest pain, increased sob or doe, wheezing, orthopnea, PND, increased LE swelling, palpitations, dizziness or syncope.   Pt denies polydipsia, polyuria, or new focal neuro s/s.   Pt denies fever, wt loss, night sweats, loss of appetite, or other constitutional symptoms   no other new complaints     Wt Readings from Last 3 Encounters:  09/11/20 178 lb 12.8 oz (81.1 kg)  09/10/20 179 lb 3.2 oz (81.3 kg)  02/13/20 177 lb (80.3 kg)   BP Readings from Last 3 Encounters:  09/11/20 120/78  09/10/20 120/80  02/13/20 120/78   Immunization History  Administered Date(s) Administered   Influenza,inj,Quad PF,6+ Mos 12/11/2016, 12/10/2017, 11/26/2018   Influenza-Unspecified 01/15/2015, 01/17/2020   Moderna Sars-Covid-2 Vaccination 05/30/2019, 06/30/2019   Pneumococcal Conjugate-13 09/11/2020   Pneumococcal Polysaccharide-23 11/21/2014, 12/18/2015   Td 08/29/2009   Tdap 12/06/2013   Zoster Recombinat (Shingrix) 09/11/2020   Health Maintenance Due  Topic Date Due   Pneumococcal Vaccine 56-56 Years old (1 - PCV) Never done   COLONOSCOPY (Pts 45-13yr Insurance coverage will need to be confirmed)  11/10/2019   OPHTHALMOLOGY EXAM  03/27/2020      Past Medical History:  Diagnosis Date   Chest pain 02/13/2012   echo - EF >55%;mod concentric L ventricular hypertrophy; atrial septum aneurysmal; patent foramen ovale suspected on color flow, LA mil/mod dilation; RV systolic pressure [id number]  Chest pain 06/09/2007   echo - EF 45-50%; interarterial shunt; mild valvular regurgitation;    Chest pain  06/09/2007   Myoview - EF 53%; normal perfusion all regions;EKG negative for ischemia    Diabetes mellitus    type II   DIABETES MELLITUS, TYPE II 08/29/2009   GERD (gastroesophageal reflux disease) 08/12/2010   Hemorrhoids    History of anal fissures    Hyperlipidemia    HYPERLIPIDEMIA 08/29/2009   Hypertension    HYPERTENSION 01/14/2008   Lightheadedness 05/10/2002   30d monitor unremarkable   LVH (left ventricular hypertrophy)    with systolic dysfunction by echo 10/03, EF 45-50% - Dr. GMelvern Banker  Multinodular goiter 12/06/2015   Nonspecific ST-T wave electrocardiographic changes 08/11/2006   Myoview - EF 49%; normal perfusion all regions, EKG negative for ischemia   OSA (obstructive sleep apnea)    Preventative health care 08/11/2010   Sleep apnea 08/13/2008   AHI 1.2; sleep study 10/2007 - AHI 9.47at 15cm H2O and 31.3 at [facility] OBSTRUCTIVE 01/14/2008   Past Surgical History:  Procedure Laterality Date   ANAL SPHINCTEROTOMY  08/20/11   CARDIAC CATHETERIZATION  10/23/2008   normal coronary arteries and LV function   COLONOSCOPY     POLYPECTOMY      reports that he has been smoking cigarettes. He has been smoking an average of 0.50 packs per day. He has never used smokeless tobacco. He reports current alcohol use of about 3.0 standard drinks of alcohol per week.  He reports that he does not use drugs. family history includes Heart disease in his father; Hypertension in his mother. Allergies  Allergen Reactions   Sulfa Antibiotics Hives and Swelling   Vicodin [Hydrocodone-Acetaminophen] Itching   Current Outpatient Medications on File Prior to Visit  Medication Sig Dispense Refill   amLODipine-benazepril (LOTREL) 10-40 MG capsule TAKE 1 CAPSULE BY MOUTH EVERY DAY 90 capsule 3   aspirin 81 MG EC tablet Take 81 mg by mouth daily.     Blood Glucose Monitoring Suppl (ONE TOUCH ULTRA 2) w/Device KIT Use as directed  E11.9 1 each 0   Docusate Calcium ([employer] STOOL SOFTENER PO) Take  2 tablets by mouth 2 (two) times daily.     hydrochlorothiazide (HYDRODIURIL) 25 MG tablet TAKE 1 TABLET BY MOUTH EVERY DAY 90 tablet 0   ibuprofen (ADVIL,MOTRIN) 200 MG tablet Take 400 mg by mouth every 6 (six) hours as needed. Reported on 06/01/2015     Lancets MISC Use as directed E11.9 100 each 11   metFORMIN (GLUCOPHAGE-XR) 500 MG 24 hr tablet TAKE 2 TABLETS BY MOUTH EVERY DAY WITH BREAKFAST 180 tablet 0   nebivolol (BYSTOLIC) 10 MG tablet TAKE 2 TABLETS (20 MG TOTAL) BY MOUTH DAILY. MUST KEEP SCHEDULED APPT FOR FUTURE REFILLS 60 tablet 1   omeprazole (PRILOSEC) 40 MG capsule Take 1 capsule (40 mg total) by mouth daily. 90 capsule 3   meloxicam (MOBIC) 15 MG tablet Take 1 tablet (15 mg total) by mouth daily. (Patient not taking: Reported on 09/11/2020) 10 tablet 0   potassium chloride (KLOR-CON M10) 10 MEQ tablet Take 2 tablets (20 mEq total) by mouth daily. Must keep scheduled appt in June for future refills (Patient not taking: Reported on 09/11/2020) 60 tablet 1   No current facility-administered medications on file prior to visit.        ROS:  All others reviewed and negative.  Objective        PE:  BP 120/78 (BP Location: Left Arm, Patient Position: Sitting, Cuff Size: Normal)   Pulse 62   Temp 98.3 F (36.8 C) (Oral)   Ht $R'6\' 1"'jv$  (1.854 m)   Wt 178 lb 12.8 oz (81.1 kg)   BMI 23.59 kg/m                 Constitutional: Pt appears in NAD               HENT: Head: NCAT.                Right Ear: External ear normal.                 Left Ear: External ear normal.                Eyes: . Pupils are equal, round, and reactive to light. Conjunctivae and EOM are normal               Nose: without d/c or deformity               Neck: Neck supple. Gross normal ROM               Cardiovascular: Normal rate and regular rhythm.                 Pulmonary/Chest: Effort normal and breath sounds without rales or wheezing.                Abd:  Soft, NT, ND, + BS, no organomegaly  Neurological: Pt is alert. At baseline orientation, motor grossly intact               Skin: Skin is warm. No rashes, no other new lesions, LE edema - none               Psychiatric: Pt behavior is normal without agitation   Micro: none  Cardiac tracings I have personally interpreted today:  none  Pertinent Radiological findings (summarize): none   Lab Results  Component Value Date   WBC 6.9 09/10/2020   HGB 13.8 09/10/2020   HCT 40.5 09/10/2020   PLT 258.0 09/10/2020   GLUCOSE 98 09/10/2020   CHOL 157 09/10/2020   TRIG 98.0 09/10/2020   HDL 50.20 09/10/2020   LDLDIRECT 117.9 01/14/2008   LDLCALC 87 09/10/2020   ALT 12 09/10/2020   AST 14 09/10/2020   NA 141 09/10/2020   K 3.1 (L) 09/10/2020   CL 99 09/10/2020   CREATININE 1.29 09/10/2020   BUN 17 09/10/2020   CO2 30 09/10/2020   TSH 0.28 (L) 09/10/2020   PSA 0.39 09/10/2020   INR 1.09 08/05/2010   HGBA1C 6.1 09/10/2020   MICROALBUR 2.5 (H) 09/10/2020   Assessment/Plan:  Kailon Treese is a 60 y.o. Black or African American [2] male with  has a past medical history of Chest pain (02/13/2012), Chest pain (06/09/2007), Chest pain (06/09/2007), Diabetes mellitus, DIABETES MELLITUS, TYPE II (08/29/2009), GERD (gastroesophageal reflux disease) (08/12/2010), Hemorrhoids, History of anal fissures, Hyperlipidemia, HYPERLIPIDEMIA (08/29/2009), Hypertension, HYPERTENSION (01/14/2008), Lightheadedness (05/10/2002), LVH (left ventricular hypertrophy), Multinodular goiter (12/06/2015), Nonspecific ST-T wave electrocardiographic changes (08/11/2006), OSA (obstructive sleep apnea), Preventative health care (08/11/2010), Sleep apnea (08/13/2008), and SLEEP APNEA, OBSTRUCTIVE (01/14/2008).  Encounter for well adult exam with abnormal findings Age and sex appropriate education and counseling updated with regular exercise and diet Referrals for preventative services - for colonoscopy, and optho referrals Immunizations addressed - for shingrix and  prevnar 13 Smoking counseling  - counseled to quit, pt not ready Evidence for depression or other mood disorder - none significant Most recent labs reviewed. I have personally reviewed and have noted: 1) the patient's medical and social history 2) The patient's current medications and supplements 3) The patient's height, weight, and BMI have been recorded in the chart   Diabetes Lab Results  Component Value Date   HGBA1C 6.1 09/10/2020   Stable, pt to continue current medical treatment metformin, januviia   Hyperlipidemia Lab Results  Component Value Date   LDLCALC 87 09/10/2020   Goal ldl < 70, uncontrolled, for increased pravastatin to 80 mg , cont low chol diet   Essential hypertension BP Readings from Last 3 Encounters:  09/11/20 120/78  09/10/20 120/80  02/13/20 120/78   Stable, pt to continue medical treatment , lotrel, hct   Tobacco abuse Counseled to quit, pt not ready  Followup: Return in about 6 months (around 03/13/2021).  Cathlean Cower, MD 09/11/2020 10:11 PM [facility]

## 2020-09-11 NOTE — Assessment & Plan Note (Signed)
BP Readings from Last 3 Encounters:  09/11/20 120/78  09/10/20 120/80  02/13/20 120/78   Stable, pt to continue medical treatment , lotrel, hct

## 2020-09-11 NOTE — Assessment & Plan Note (Signed)
Lab Results  Component Value Date   HGBA1C 6.1 09/10/2020   Stable, pt to continue current medical treatment metformin, [place]

## 2020-09-11 NOTE — Assessment & Plan Note (Signed)
Lab Results  Component Value Date   LDLCALC 87 09/10/2020   Goal ldl < 70, uncontrolled, for increased pravastatin to 80 mg , cont low chol diet

## 2020-09-18 ENCOUNTER — Encounter: Payer: Self-pay | Admitting: Internal Medicine

## 2020-09-21 ENCOUNTER — Other Ambulatory Visit: Payer: Self-pay | Admitting: Internal Medicine

## 2020-09-21 NOTE — Telephone Encounter (Signed)
Please refill as per office routine med refill policy (all routine meds refilled for 3 mo or monthly per pt preference up to one year from last visit, then month to month grace period for 3 mo, then further med refills will have to be denied)  

## 2020-09-26 ENCOUNTER — Other Ambulatory Visit: Payer: Self-pay | Admitting: Internal Medicine

## 2020-09-26 NOTE — Telephone Encounter (Signed)
Please refill as per office routine med refill policy (all routine meds refilled for 3 mo or monthly per pt preference up to one year from last visit, then month to month grace period for 3 mo, then further med refills will have to be denied)  

## 2020-09-27 ENCOUNTER — Other Ambulatory Visit: Payer: Self-pay | Admitting: Internal Medicine

## 2020-09-27 NOTE — Telephone Encounter (Signed)
Please refill as per office routine med refill policy (all routine meds refilled for 3 mo or monthly per pt preference up to one year from last visit, then month to month grace period for 3 mo, then further med refills will have to be denied)  

## 2020-10-18 ENCOUNTER — Other Ambulatory Visit: Payer: Self-pay | Admitting: Internal Medicine

## 2020-10-18 NOTE — Telephone Encounter (Signed)
Please refill as per office routine med refill policy (all routine meds refilled for 3 mo or monthly per pt preference up to one year from last visit, then month to month grace period for 3 mo, then further med refills will have to be denied)  

## 2020-12-05 ENCOUNTER — Encounter: Payer: Self-pay | Admitting: Podiatry

## 2020-12-06 NOTE — Telephone Encounter (Signed)
Please advise 

## 2020-12-07 ENCOUNTER — Telehealth: Payer: Self-pay | Admitting: *Deleted

## 2020-12-07 NOTE — Telephone Encounter (Signed)
Per Dr Prudence Davidson, patient needs an appointment with one of the surgery doctors for hallux limits. Please schedule.

## 2020-12-20 ENCOUNTER — Other Ambulatory Visit: Payer: Self-pay

## 2020-12-20 ENCOUNTER — Ambulatory Visit: Payer: 59 | Admitting: Endocrinology

## 2020-12-20 VITALS — BP 140/78 | HR 78 | Ht 73.0 in | Wt 180.2 lb

## 2020-12-20 DIAGNOSIS — E042 Nontoxic multinodular goiter: Secondary | ICD-10-CM

## 2020-12-20 DIAGNOSIS — E0800 Diabetes mellitus due to underlying condition with hyperosmolarity without nonketotic hyperglycemic-hyperosmolar coma (NKHHC): Secondary | ICD-10-CM

## 2020-12-20 DIAGNOSIS — E0590 Thyrotoxicosis, unspecified without thyrotoxic crisis or storm: Secondary | ICD-10-CM

## 2020-12-20 DIAGNOSIS — Z794 Long term (current) use of insulin: Secondary | ICD-10-CM

## 2020-12-20 NOTE — Progress Notes (Signed)
Subjective:    Patient ID: [name], male    DOB: 1960-08-27, 60 y.o.   MRN: [id number]  HPI Pt returns for f/u of multinodular goiter (dx'ed 2016; nuc med scan and US showed multinodular goiter, right lobe >> left; bx in 2016 showed beth cat 2; he has never been on thyroid medication; f/u US in 2021 showed slight enlargement).  He does not notice the goiter.   Past Medical History:  Diagnosis Date   Chest pain 02/13/2012   echo - EF >55%;mod concentric L ventricular hypertrophy; atrial septum aneurysmal; patent foramen ovale suspected on color flow, LA mil/mod dilation; RV systolic pressure [id number]   Chest pain 06/09/2007   echo - EF 45-50%; interarterial shunt; mild valvular regurgitation;    Chest pain 06/09/2007   Myoview - EF 53%; normal perfusion all regions;EKG negative for ischemia    Diabetes mellitus    type II   DIABETES MELLITUS, TYPE II 08/29/2009   GERD (gastroesophageal reflux disease) 08/12/2010   Hemorrhoids    History of anal fissures    Hyperlipidemia    HYPERLIPIDEMIA 08/29/2009   Hypertension    HYPERTENSION 01/14/2008   Lightheadedness 05/10/2002   30d monitor unremarkable   LVH (left ventricular hypertrophy)    with systolic dysfunction by echo 10/03, EF 45-50% - Dr. Melvern Banker   Multinodular goiter 12/06/2015   Nonspecific ST-T wave electrocardiographic changes 08/11/2006   Myoview - EF 49%; normal perfusion all regions, EKG negative for ischemia   OSA (obstructive sleep apnea)    Preventative health care 08/11/2010   Sleep apnea 08/13/2008   AHI 1.2; sleep study 10/2007 - AHI 9.47at 15cm H2O and 31.3 at [place], OBSTRUCTIVE 01/14/2008    Past Surgical History:  Procedure Laterality Date   ANAL SPHINCTEROTOMY  08/20/11   CARDIAC CATHETERIZATION  10/23/2008   normal coronary arteries and LV function   COLONOSCOPY     POLYPECTOMY      Social History   Socioeconomic History   Marital status: Single    Spouse name: Not on file   Number of  children: 1   Years of education: Not on file   Highest education level: Not on file  Occupational History   Occupation: work as truck Education administrator: [employer]  Tobacco Use   Smoking status: Every Day    Packs/day: 0.50    Types: Cigarettes   Smokeless tobacco: Never  Substance and Sexual Activity   Alcohol use: Yes    Alcohol/week: 3.0 standard drinks    Types: 3 Standard drinks or equivalent per week    Comment: occaisionally   Drug use: No   Sexual activity: Not on file  Other Topics Concern   Not on file  Social History Narrative   Not on file   Social Determinants of Health   Financial Resource Strain: Not on file  Food Insecurity: Not on file  Transportation Needs: Not on file  Physical Activity: Not on file  Stress: Not on file  Social Connections: Not on file  Intimate Partner Violence: Not on file    Current Outpatient Medications on File Prior to Visit  Medication Sig Dispense Refill   amLODipine-benazepril (LOTREL) 10-40 MG capsule TAKE 1 CAPSULE BY MOUTH EVERY DAY 90 capsule 3   aspirin 81 MG EC tablet Take 81 mg by mouth daily.     Blood Glucose Monitoring Suppl (ONE TOUCH ULTRA 2) w/Device KIT Use as directed  E11.9 1 each  0   Docusate Calcium ([employer] STOOL SOFTENER PO) Take 2 tablets by mouth 2 (two) times daily.     hydrALAZINE (APRESOLINE) 50 MG tablet Take 1 tablet (50 mg total) by mouth 3 (three) times daily. 270 tablet 3   hydrochlorothiazide (HYDRODIURIL) 25 MG tablet TAKE 1 TABLET BY MOUTH EVERY DAY 90 tablet 0   ibuprofen (ADVIL,MOTRIN) 200 MG tablet Take 400 mg by mouth every 6 (six) hours as needed. Reported on 06/01/2015     JANUVIA 100 MG tablet TAKE 1 TABLET BY MOUTH EVERY DAY 90 tablet 3   Lancets MISC Use as directed E11.9 100 each 11   meloxicam (MOBIC) 15 MG tablet Take 1 tablet (15 mg total) by mouth daily. 10 tablet 0   metFORMIN (GLUCOPHAGE-XR) 500 MG 24 hr tablet TAKE 2 TABLETS BY MOUTH EVERY DAY WITH BREAKFAST 180 tablet 0    nebivolol (BYSTOLIC) 10 MG tablet TAKE 2 TABLETS (20 MG TOTAL) BY MOUTH DAILY. 60 tablet 11   omeprazole (PRILOSEC) 40 MG capsule TAKE 1 CAPSULE BY MOUTH EVERY DAY 90 capsule 3   potassium chloride (KLOR-CON M10) 10 MEQ tablet Take 2 tablets (20 mEq total) by mouth daily. Must keep scheduled appt in June for future refills 60 tablet 1   pravastatin (PRAVACHOL) 80 MG tablet Take 1 tablet (80 mg total) by mouth daily. 90 tablet 3   No current facility-administered medications on file prior to visit.    Allergies  Allergen Reactions   Sulfa Antibiotics Hives and Swelling   Vicodin [Hydrocodone-Acetaminophen] Itching    Family History  Problem Relation Age of Onset   Hypertension Mother    Heart disease Father    Colon cancer Neg Hx    Thyroid disease Neg Hx     BP 140/78 (BP Location: Right Arm, Patient Position: Sitting, Cuff Size: Normal)   Pulse 78   Ht 6' 1" (1.854 m)   Wt 180 lb 3.2 oz (81.7 kg)   SpO2 96%   BMI 23.77 kg/m   Review of Systems he denies palpitations and tremor    Objective:   Physical Exam Neck: small MNG, R>L  Lab Results  Component Value Date   TSH 0.28 (L) 09/10/2020      Assessment & Plan:  Hyperthyroidism We discussed taking tapazole.  Pt declines, unless TSH is much lower.  Lab is not available today.  He declines to go to another office.   MNG: clinically stable.  We'll follow 

## 2020-12-20 NOTE — Patient Instructions (Signed)
No medication is needed for the thyroid now.   most of the time, a "lumpy thyroid" will eventually become more overactive.  this is usually a slow process, happening over the span of many years.   Please come back for a follow-up appointment in 6 months.

## 2020-12-24 ENCOUNTER — Other Ambulatory Visit: Payer: Self-pay | Admitting: Internal Medicine

## 2020-12-24 NOTE — Telephone Encounter (Signed)
Please refill as per office routine med refill policy (all routine meds to be refilled for 3 mo or monthly (per pt preference) up to one year from last visit, then month to month grace period for 3 mo, then further med refills will have to be denied) ? ?

## 2021-01-16 ENCOUNTER — Other Ambulatory Visit: Payer: Self-pay | Admitting: Internal Medicine

## 2021-01-16 NOTE — Telephone Encounter (Signed)
Please refill as per office routine med refill policy (all routine meds to be refilled for 3 mo or monthly (per pt preference) up to one year from last visit, then month to month grace period for 3 mo, then further med refills will have to be denied) ? ?

## 2021-01-31 ENCOUNTER — Encounter: Payer: Self-pay | Admitting: Internal Medicine

## 2021-02-04 NOTE — Telephone Encounter (Signed)
Spoke with pt and was able to schedule him an apptmnt for Wednesday 02/06/2021 at 3:40.

## 2021-02-05 ENCOUNTER — Other Ambulatory Visit (INDEPENDENT_AMBULATORY_CARE_PROVIDER_SITE_OTHER): Payer: 59

## 2021-02-05 ENCOUNTER — Other Ambulatory Visit: Payer: Self-pay

## 2021-02-05 DIAGNOSIS — E0800 Diabetes mellitus due to underlying condition with hyperosmolarity without nonketotic hyperglycemic-hyperosmolar coma (NKHHC): Secondary | ICD-10-CM

## 2021-02-05 DIAGNOSIS — Z794 Long term (current) use of insulin: Secondary | ICD-10-CM

## 2021-02-06 ENCOUNTER — Ambulatory Visit: Payer: 59 | Admitting: Internal Medicine

## 2021-02-06 ENCOUNTER — Encounter: Payer: Self-pay | Admitting: Internal Medicine

## 2021-02-06 DIAGNOSIS — N179 Acute kidney failure, unspecified: Secondary | ICD-10-CM

## 2021-02-06 DIAGNOSIS — Z794 Long term (current) use of insulin: Secondary | ICD-10-CM

## 2021-02-06 DIAGNOSIS — I10 Essential (primary) hypertension: Secondary | ICD-10-CM | POA: Diagnosis not present

## 2021-02-06 DIAGNOSIS — E0800 Diabetes mellitus due to underlying condition with hyperosmolarity without nonketotic hyperglycemic-hyperosmolar coma (NKHHC): Secondary | ICD-10-CM

## 2021-02-06 LAB — HEPATIC FUNCTION PANEL
ALT: 11 U/L (ref 0–53)
AST: 17 U/L (ref 0–37)
Albumin: 4.7 g/dL (ref 3.5–5.2)
Alkaline Phosphatase: 59 U/L (ref 39–117)
Bilirubin, Direct: 0.1 mg/dL (ref 0.0–0.3)
Total Bilirubin: 0.5 mg/dL (ref 0.2–1.2)
Total Protein: 7.3 g/dL (ref 6.0–8.3)

## 2021-02-06 LAB — LIPID PANEL
Cholesterol: 131 mg/dL (ref 0–200)
HDL: 45.5 mg/dL (ref >39.00)
LDL Cholesterol: 69 mg/dL (ref 0–99)
NonHDL: 85.96
Total CHOL/HDL Ratio: 3
Triglycerides: 85 mg/dL (ref 0.0–149.0)
VLDL: 17 mg/dL (ref 0.0–40.0)

## 2021-02-06 LAB — BASIC METABOLIC PANEL
BUN: 40 mg/dL — ABNORMAL HIGH (ref 6–23)
CO2: 26 mEq/L (ref 19–32)
Calcium: 9.7 mg/dL (ref 8.4–10.5)
Chloride: 105 mEq/L (ref 96–112)
Creatinine, Ser: 2.43 mg/dL — ABNORMAL HIGH (ref 0.40–1.50)
GFR: 27.95 mL/min — ABNORMAL LOW (ref >60.00)
Glucose, Bld: 93 mg/dL (ref 70–99)
Potassium: 3.2 mEq/L — ABNORMAL LOW (ref 3.5–5.1)
Sodium: 142 mEq/L (ref 135–145)

## 2021-02-06 LAB — HEMOGLOBIN A1C: Hgb A1c MFr Bld: 6 % (ref 4.6–6.5)

## 2021-02-06 NOTE — Progress Notes (Signed)
Patient ID: [name], male   DOB: 1961-01-22, 60 y.o.   MRN: [id number]        Chief Complaint: follow up fatigue with finding AKI       HPI:  [name] is a 60 y.o. male here with c/o "extreme fatigue" with daughter and other symptoms including 1 wk chills for 1 day, then fatigue, off balance walking at times, difficulty swallowing, early satiety, and several lbs wt loss.  Has low grade temp today he is not aware.  Tested covid neg.  Denies HA, sinus pain, ST, high fevers, n/v, diarrhea, or any other pain.  Incidebtlty had labs done prior to this visit yesterday with finding AKI by labs with cr 2.45, over baseline approx 1.1.  Denies urinary symptoms such as dysuria, frequency, urgency, flank pain, hematuria or n/v, fever, chills.         Wt Readings from Last 3 Encounters:  02/06/21 173 lb (78.5 kg)  12/20/20 180 lb 3.2 oz (81.7 kg)  09/11/20 178 lb 12.8 oz (81.1 kg)   BP Readings from Last 3 Encounters:  02/06/21 100/62  12/20/20 140/78  09/11/20 120/78         Past Medical History:  Diagnosis Date   Chest pain 02/13/2012   echo - EF >55%;mod concentric L ventricular hypertrophy; atrial septum aneurysmal; patent foramen ovale suspected on color flow, LA mil/mod dilation; RV systolic pressure [id number]   Chest pain 06/09/2007   echo - EF 45-50%; interarterial shunt; mild valvular regurgitation;    Chest pain 06/09/2007   Myoview - EF 53%; normal perfusion all regions;EKG negative for ischemia    Diabetes mellitus    type II   DIABETES MELLITUS, TYPE II 08/29/2009   GERD (gastroesophageal reflux disease) 08/12/2010   Hemorrhoids    History of anal fissures    Hyperlipidemia    HYPERLIPIDEMIA 08/29/2009   Hypertension    HYPERTENSION 01/14/2008   Lightheadedness 05/10/2002   30d monitor unremarkable   LVH (left ventricular hypertrophy)    with systolic dysfunction by echo 10/03, EF 45-50% - Dr. Melvern Banker   Multinodular goiter 12/06/2015   Nonspecific ST-T wave  electrocardiographic changes 08/11/2006   Myoview - EF 49%; normal perfusion all regions, EKG negative for ischemia   OSA (obstructive sleep apnea)    Preventative health care 08/11/2010   Sleep apnea 08/13/2008   AHI 1.2; sleep study 10/2007 - AHI 9.47at 15cm H2O and 31.3 at [place], OBSTRUCTIVE 01/14/2008   Past Surgical History:  Procedure Laterality Date   ANAL SPHINCTEROTOMY  08/20/11   CARDIAC CATHETERIZATION  10/23/2008   normal coronary arteries and LV function   COLONOSCOPY     POLYPECTOMY      reports that he has been smoking cigarettes. He has been smoking an average of .5 packs per day. He has never used smokeless tobacco. He reports current alcohol use of about 3.0 standard drinks per week. He reports that he does not use drugs. family history includes Heart disease in his father; Hypertension in his mother. Allergies  Allergen Reactions   Sulfa Antibiotics Hives and Swelling   Vicodin [Hydrocodone-Acetaminophen] Itching   Current Outpatient Medications on File Prior to Visit  Medication Sig Dispense Refill   amLODipine-benazepril (LOTREL) 10-40 MG capsule TAKE 1 CAPSULE BY MOUTH EVERY DAY 90 capsule 3   aspirin 81 MG EC tablet Take 81 mg by mouth daily.     Blood Glucose Monitoring Suppl (ONE TOUCH ULTRA 2) w/Device KIT  Use as directed  E11.9 1 each 0   Docusate Calcium ([employer] STOOL SOFTENER PO) Take 2 tablets by mouth 2 (two) times daily.     hydrALAZINE (APRESOLINE) 50 MG tablet Take 1 tablet (50 mg total) by mouth 3 (three) times daily. 270 tablet 3   hydrochlorothiazide (HYDRODIURIL) 25 MG tablet TAKE 1 TABLET BY MOUTH EVERY DAY 90 tablet 0   ibuprofen (ADVIL,MOTRIN) 200 MG tablet Take 400 mg by mouth every 6 (six) hours as needed. Reported on 06/01/2015     JANUVIA 100 MG tablet TAKE 1 TABLET BY MOUTH EVERY DAY 90 tablet 3   Lancets MISC Use as directed E11.9 100 each 11   meloxicam (MOBIC) 15 MG tablet Take 1 tablet (15 mg total) by mouth daily. 10 tablet 0    metFORMIN (GLUCOPHAGE-XR) 500 MG 24 hr tablet TAKE 2 TABLETS BY MOUTH EVERY DAY WITH BREAKFAST 180 tablet 3   nebivolol (BYSTOLIC) 10 MG tablet TAKE 2 TABLETS (20 MG TOTAL) BY MOUTH DAILY. 60 tablet 11   omeprazole (PRILOSEC) 40 MG capsule TAKE 1 CAPSULE BY MOUTH EVERY DAY 90 capsule 3   potassium chloride (KLOR-CON M10) 10 MEQ tablet Take 2 tablets (20 mEq total) by mouth daily. Must keep scheduled appt in June for future refills 60 tablet 1   pravastatin (PRAVACHOL) 80 MG tablet Take 1 tablet (80 mg total) by mouth daily. 90 tablet 3   No current facility-administered medications on file prior to visit.        ROS:  All others reviewed and negative.  Objective        PE:  BP 100/62 (BP Location: Left Arm, Patient Position: Sitting, Cuff Size: Large)   Pulse 76   Temp 99.2 F (37.3 C) (Oral)   Ht _0  (1.854 m)   Wt 173 lb (78.5 kg)   SpO2 95%   BMI 22.82 kg/m                 Constitutional: Pt appears in NAD but generalized weakness, fatigued               HENT: Head: NCAT.                Right Ear: External ear normal.                 Left Ear: External ear normal.                Eyes: . Pupils are equal, round, and reactive to light. Conjunctivae and EOM are normal               Nose: without d/c or deformity               Neck: Neck supple. Gross normal ROM               Cardiovascular: Normal rate and regular rhythm.                 Pulmonary/Chest: Effort normal and breath sounds without rales or wheezing.                Abd:  Soft, NT, ND, + BS, no organomegaly               Neurological: Pt is alert. At baseline orientation, motor grossly intact               Skin: Skin is warm. No rashes, no other new lesions, LE edema - none  Psychiatric: Pt behavior is normal without agitation   Micro: none  Cardiac tracings I have personally interpreted today:  none  Pertinent Radiological findings (summarize): none   Lab Results  Component Value Date   WBC  6.9 09/10/2020   HGB 13.8 09/10/2020   HCT 40.5 09/10/2020   PLT 258.0 09/10/2020   GLUCOSE 93 02/05/2021   CHOL 131 02/05/2021   TRIG 85.0 02/05/2021   HDL 45.50 02/05/2021   LDLDIRECT 117.9 01/14/2008   LDLCALC 69 02/05/2021   ALT 11 02/05/2021   AST 17 02/05/2021   NA 142 02/05/2021   K 3.2 (L) 02/05/2021   CL 105 02/05/2021   CREATININE 2.43 (H) 02/05/2021   BUN 40 (H) 02/05/2021   CO2 26 02/05/2021   TSH 0.28 (L) 09/10/2020   PSA 0.39 09/10/2020   INR 1.09 08/05/2010   HGBA1C 6.0 02/05/2021   MICROALBUR 2.5 (H) 09/10/2020   Assessment/Plan:  [name] is a 60 y.o. Black or African American [2] male with  has a past medical history of Chest pain (02/13/2012), Chest pain (06/09/2007), Chest pain (06/09/2007), Diabetes mellitus, DIABETES MELLITUS, TYPE II (08/29/2009), GERD (gastroesophageal reflux disease) (08/12/2010), Hemorrhoids, History of anal fissures, Hyperlipidemia, HYPERLIPIDEMIA (08/29/2009), Hypertension, HYPERTENSION (01/14/2008), Lightheadedness (05/10/2002), LVH (left ventricular hypertrophy), Multinodular goiter (12/06/2015), Nonspecific ST-T wave electrocardiographic changes (08/11/2006), OSA (obstructive sleep apnea), Preventative health care (08/11/2010), Sleep apnea (08/13/2008), and SLEEP APNEA, OBSTRUCTIVE (01/14/2008).  AKI (acute kidney injury) ([place]) I suspect due to low volume but etiology unclear, pt and duaghter advised for ED for possible IVF and further lab or other to determine etiology  Diabetes [facility]) Lab Results  Component Value Date   HGBA1C 6.0 02/05/2021   Stable, pt to continue current medical treatment januvia   Essential hypertension BP Readings from Last 3 Encounters:  02/06/21 100/62  12/20/20 140/78  09/11/20 120/78   Stable, pt to continue medical treatment but hold hct till further notice  Followup: Return if symptoms worsen or fail to improve.  Cathlean Cower, MD 02/06/2021 8:34 PM [facility]

## 2021-02-06 NOTE — Assessment & Plan Note (Signed)
BP Readings from Last 3 Encounters:  02/06/21 100/62  12/20/20 140/78  09/11/20 120/78   Stable, pt to continue medical treatment but hold hct till further notice

## 2021-02-06 NOTE — Patient Instructions (Signed)
You appear to have acute renal failure by your kidney tests today and a low grade temperature  The reason for this is not clear, so please go to ED as we discussed  Please continue all other medications as before, and refills have been done if requested.  Please have the pharmacy call with any other refills you may need.  Please keep your appointments with your specialists as you may have planned

## 2021-02-06 NOTE — Assessment & Plan Note (Signed)
Lab Results  Component Value Date   HGBA1C 6.0 02/05/2021   Stable, pt to continue current medical treatment januvia

## 2021-02-06 NOTE — Assessment & Plan Note (Signed)
I suspect due to low volume but etiology unclear, pt and duaghter advised for ED for possible IVF and further lab or other to determine etiology

## 2021-02-07 ENCOUNTER — Other Ambulatory Visit: Payer: Self-pay

## 2021-02-07 ENCOUNTER — Emergency Department (HOSPITAL_COMMUNITY): Payer: 59

## 2021-02-07 ENCOUNTER — Emergency Department (HOSPITAL_COMMUNITY)
Admission: EM | Admit: 2021-02-07 | Discharge: 2021-02-07 | Disposition: A | Discharge status: Home / Self Care | Payer: 59 | Attending: Emergency Medicine | Admitting: Emergency Medicine

## 2021-02-07 ENCOUNTER — Encounter: Payer: Self-pay | Admitting: Internal Medicine

## 2021-02-07 ENCOUNTER — Other Ambulatory Visit: Payer: Self-pay | Admitting: Internal Medicine

## 2021-02-07 ENCOUNTER — Encounter (HOSPITAL_COMMUNITY): Payer: Self-pay | Admitting: Emergency Medicine

## 2021-02-07 DIAGNOSIS — E785 Hyperlipidemia, unspecified: Secondary | ICD-10-CM | POA: Diagnosis not present

## 2021-02-07 DIAGNOSIS — Z20822 Contact with and (suspected) exposure to covid-19: Secondary | ICD-10-CM | POA: Diagnosis not present

## 2021-02-07 DIAGNOSIS — E876 Hypokalemia: Secondary | ICD-10-CM | POA: Diagnosis not present

## 2021-02-07 DIAGNOSIS — F17210 Nicotine dependence, cigarettes, uncomplicated: Secondary | ICD-10-CM | POA: Diagnosis not present

## 2021-02-07 DIAGNOSIS — N179 Acute kidney failure, unspecified: Secondary | ICD-10-CM

## 2021-02-07 DIAGNOSIS — E1169 Type 2 diabetes mellitus with other specified complication: Secondary | ICD-10-CM | POA: Diagnosis not present

## 2021-02-07 DIAGNOSIS — Z7984 Long term (current) use of oral hypoglycemic drugs: Secondary | ICD-10-CM | POA: Diagnosis not present

## 2021-02-07 DIAGNOSIS — I2510 Atherosclerotic heart disease of native coronary artery without angina pectoris: Secondary | ICD-10-CM | POA: Insufficient documentation

## 2021-02-07 DIAGNOSIS — Z79899 Other long term (current) drug therapy: Secondary | ICD-10-CM | POA: Diagnosis not present

## 2021-02-07 DIAGNOSIS — I10 Essential (primary) hypertension: Secondary | ICD-10-CM | POA: Insufficient documentation

## 2021-02-07 DIAGNOSIS — R1900 Intra-abdominal and pelvic swelling, mass and lump, unspecified site: Secondary | ICD-10-CM

## 2021-02-07 DIAGNOSIS — R5383 Other fatigue: Secondary | ICD-10-CM | POA: Insufficient documentation

## 2021-02-07 LAB — CBC WITH DIFFERENTIAL/PLATELET
Abs Immature Granulocytes: 0.02 10*3/uL (ref 0.00–0.07)
Basophils Absolute: 0.1 10*3/uL (ref 0.0–0.1)
Basophils Relative: 1 %
Eosinophils Absolute: 0.2 10*3/uL (ref 0.0–0.5)
Eosinophils Relative: 4 %
HCT: 36.3 % — ABNORMAL LOW (ref 39.0–52.0)
Hemoglobin: 12.7 g/dL — ABNORMAL LOW (ref 13.0–17.0)
Immature Granulocytes: 0 %
Lymphocytes Relative: 35 %
Lymphs Abs: 2 10*3/uL (ref 0.7–4.0)
MCH: 33.2 pg (ref 26.0–34.0)
MCHC: 35 g/dL (ref 30.0–36.0)
MCV: 94.8 fL (ref 80.0–100.0)
Monocytes Absolute: 0.6 10*3/uL (ref 0.1–1.0)
Monocytes Relative: 11 %
Neutro Abs: 2.9 10*3/uL (ref 1.7–7.7)
Neutrophils Relative %: 49 %
Platelets: 241 10*3/uL (ref 150–400)
RBC: 3.83 MIL/uL — ABNORMAL LOW (ref 4.22–5.81)
RDW: 13 % (ref 11.5–15.5)
WBC: 5.8 10*3/uL (ref 4.0–10.5)
nRBC: 0 % (ref 0.0–0.2)

## 2021-02-07 LAB — COMPREHENSIVE METABOLIC PANEL
ALT: 11 U/L (ref 0–44)
AST: 18 U/L (ref 15–41)
Albumin: 3.9 g/dL (ref 3.5–5.0)
Alkaline Phosphatase: 57 U/L (ref 38–126)
Anion gap: 11 (ref 5–15)
BUN: 22 mg/dL (ref 8–23)
CO2: 24 mmol/L (ref 22–32)
Calcium: 9.4 mg/dL (ref 8.9–10.3)
Chloride: 105 mmol/L (ref 98–111)
Creatinine, Ser: 1.5 mg/dL — ABNORMAL HIGH (ref 0.61–1.24)
GFR, Estimated: 53 mL/min — ABNORMAL LOW (ref >60)
Glucose, Bld: 121 mg/dL — ABNORMAL HIGH (ref 70–99)
Potassium: 2.7 mmol/L — CL (ref 3.5–5.1)
Sodium: 140 mmol/L (ref 135–145)
Total Bilirubin: 1 mg/dL (ref 0.3–1.2)
Total Protein: 6.6 g/dL (ref 6.5–8.1)

## 2021-02-07 LAB — URINALYSIS, ROUTINE W REFLEX MICROSCOPIC
Bilirubin Urine: NEGATIVE
Glucose, UA: NEGATIVE mg/dL
Hgb urine dipstick: NEGATIVE
Ketones, ur: NEGATIVE mg/dL
Leukocytes,Ua: NEGATIVE
Nitrite: NEGATIVE
Protein, ur: NEGATIVE mg/dL
Specific Gravity, Urine: 1.011 (ref 1.005–1.030)
pH: 5 (ref 5.0–8.0)

## 2021-02-07 LAB — T4, FREE: Free T4: 0.95 ng/dL (ref 0.61–1.12)

## 2021-02-07 LAB — CK: Total CK: 239 U/L (ref 49–397)

## 2021-02-07 LAB — LIPASE, BLOOD: Lipase: 28 U/L (ref 11–51)

## 2021-02-07 LAB — RESP PANEL BY RT-PCR (FLU A&B, COVID) ARPGX2
Influenza A by PCR: NEGATIVE
Influenza B by PCR: NEGATIVE
SARS Coronavirus 2 by RT PCR: NEGATIVE

## 2021-02-07 LAB — POTASSIUM: Potassium: 3.5 mmol/L (ref 3.5–5.1)

## 2021-02-07 LAB — TSH: TSH: 0.214 u[IU]/mL — ABNORMAL LOW (ref 0.350–4.500)

## 2021-02-07 IMAGING — CR DG CHEST 2V
2 series · 2 of 2 positions shown · non-contrast
Comparison: Chest x-ray [DATE].

CLINICAL DATA: 60-year-old male with history of fever and chills
since yesterday.

EXAM:
CHEST - 2 VIEW

[chest pa]
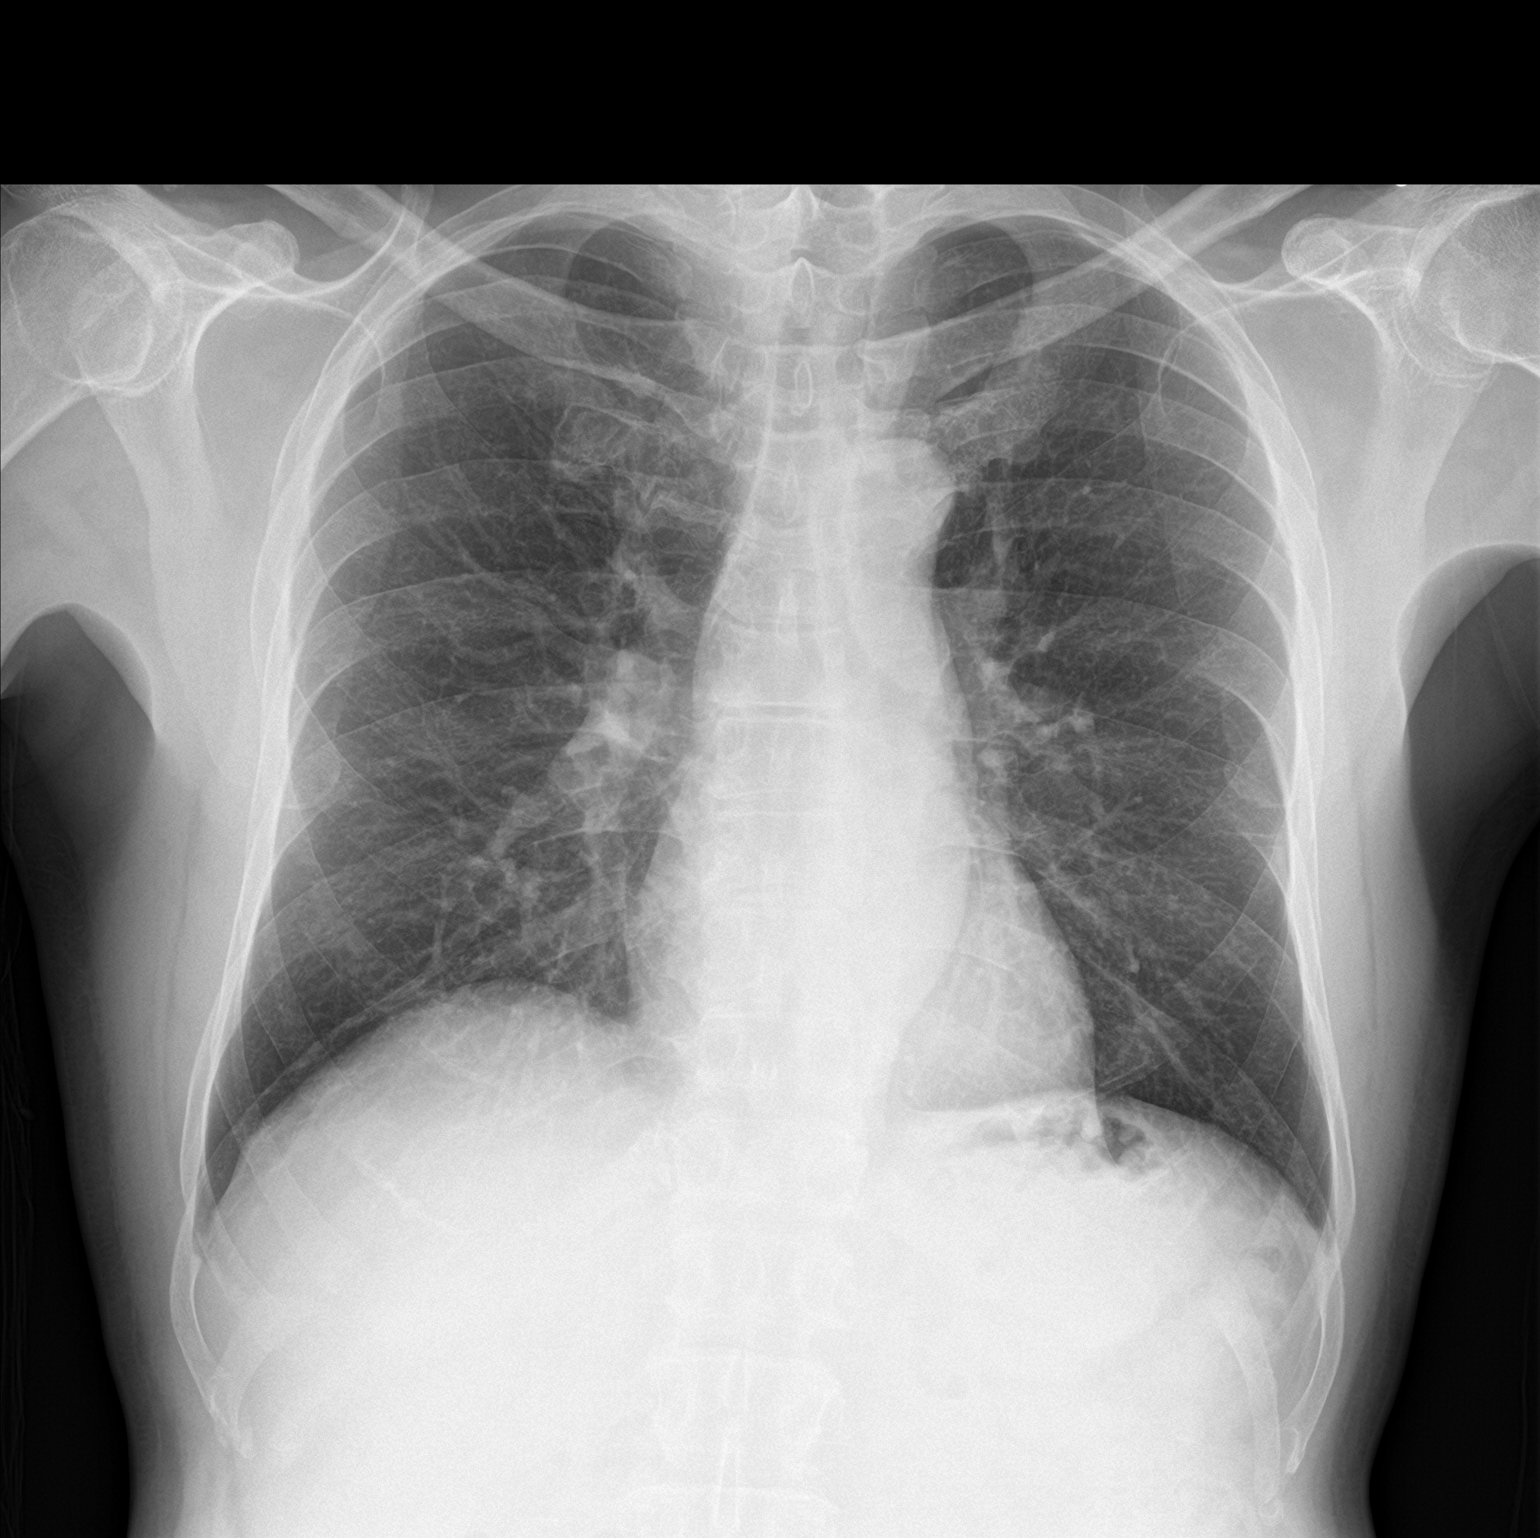

[chest lat]
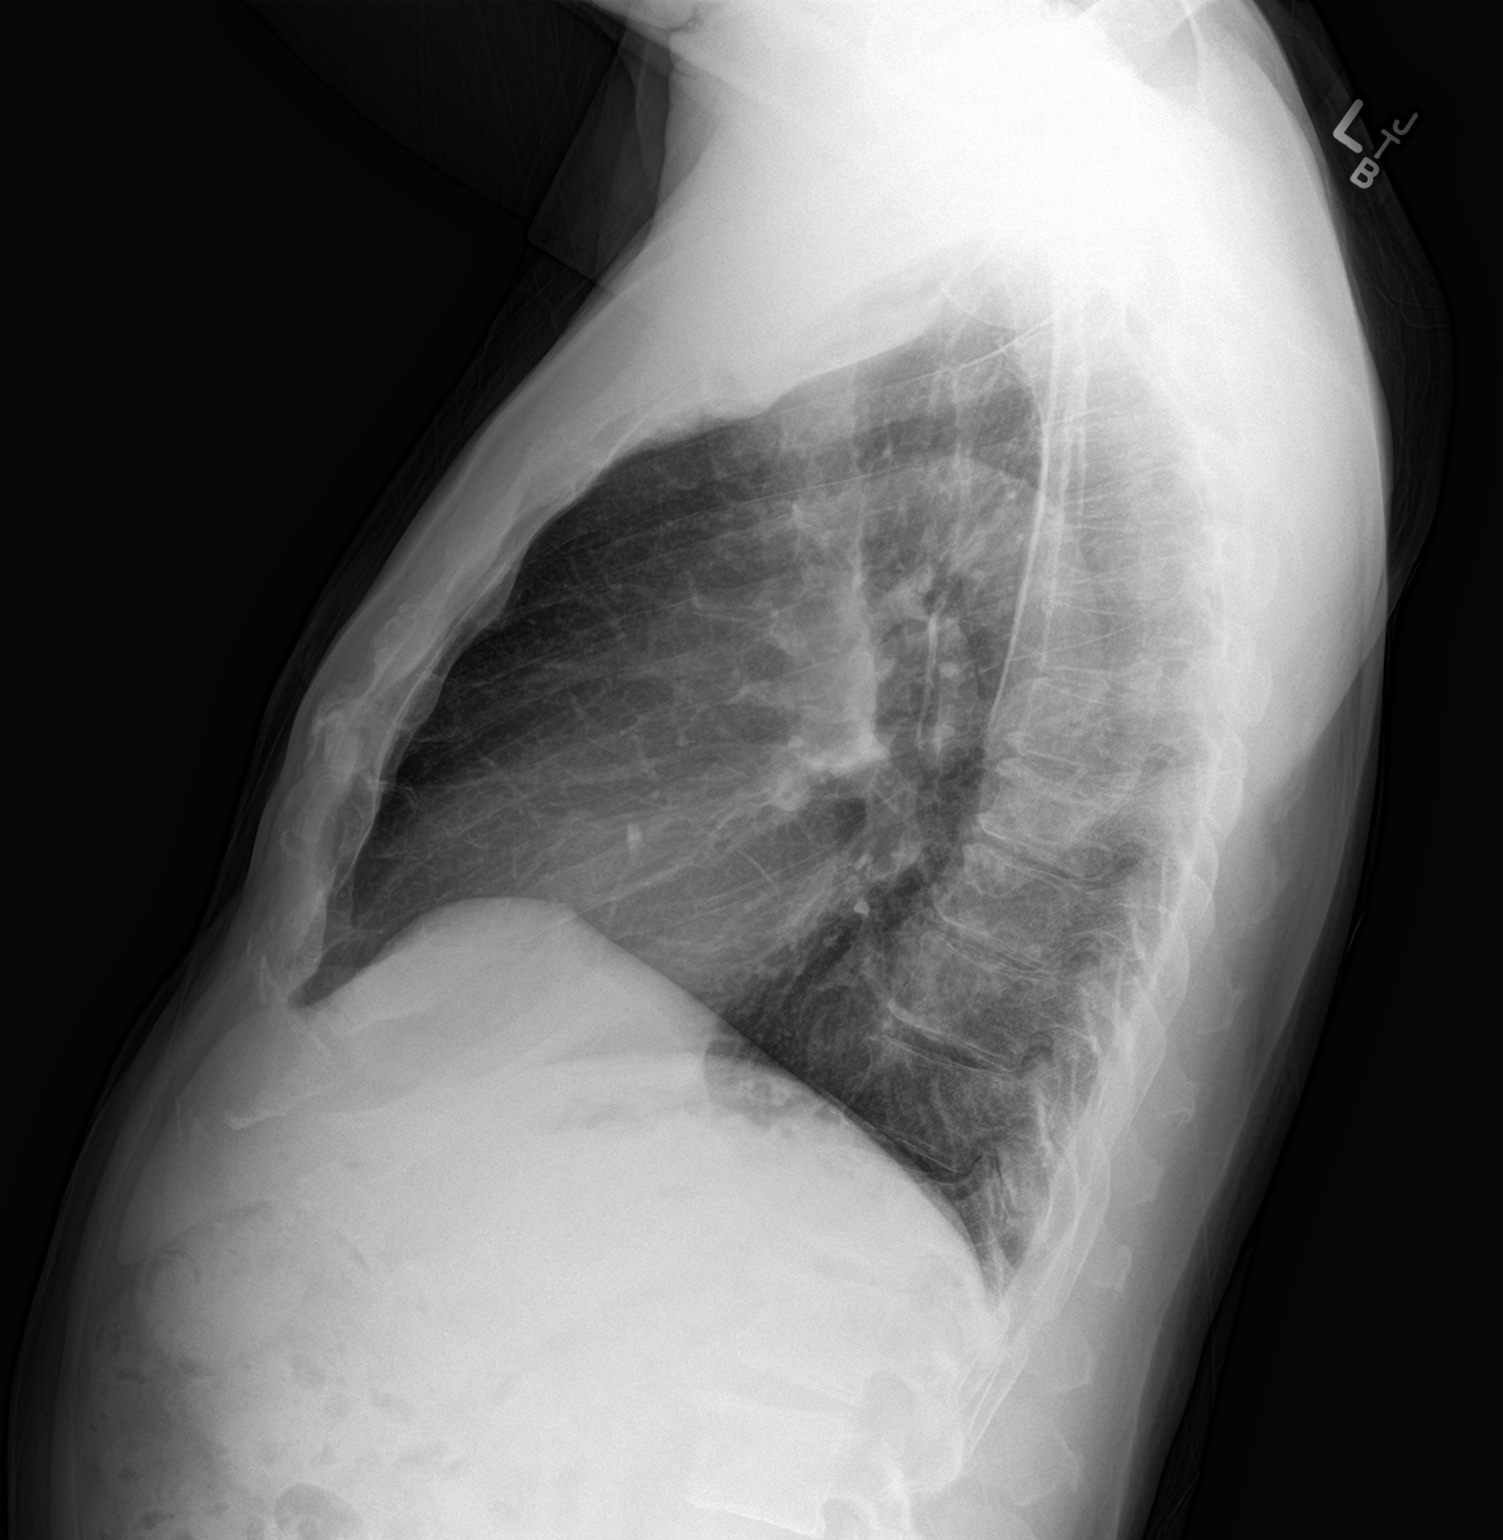

[2 of 2 positions shown; findings below may reference images not displayed]

FINDINGS: Lung volumes are normal. No consolidative airspace disease. No
pleural effusions. No pneumothorax. No pulmonary nodule or mass
noted. Pulmonary vasculature and the cardiomediastinal silhouette
are within normal limits.
IMPRESSION: No radiographic evidence of acute cardiopulmonary disease.

## 2021-02-07 MED ORDER — POTASSIUM CHLORIDE 10 MEQ/100ML IV SOLN
10.0000 meq | Freq: Once | INTRAVENOUS | Status: AC
  Administered 2021-02-07: 10 meq via INTRAVENOUS
  Filled 2021-02-07: qty 100

## 2021-02-07 MED ORDER — SODIUM CHLORIDE 0.9 % IV BOLUS
1000.0000 mL | Freq: Once | INTRAVENOUS | Status: AC
  Administered 2021-02-07: 1000 mL via INTRAVENOUS

## 2021-02-07 MED ORDER — POTASSIUM CHLORIDE CRYS ER 20 MEQ PO TBCR
40.0000 meq | EXTENDED_RELEASE_TABLET | Freq: Once | ORAL | Status: AC
  Administered 2021-02-07: 40 meq via ORAL
  Filled 2021-02-07: qty 2

## 2021-02-07 MED ORDER — POTASSIUM CHLORIDE 10 MEQ/100ML IV SOLN
10.0000 meq | INTRAVENOUS | Status: DC
  Administered 2021-02-07: 10 meq via INTRAVENOUS
  Filled 2021-02-07: qty 100

## 2021-02-07 NOTE — ED Provider Notes (Signed)
[facility] Provider Note   CSN: [id number] Arrival date & time: 02/07/21  0654    History Chief Complaint  Patient presents with   Fatigue   Chills   Dehydration    [name] is a 60 y.o. male with CAD, DM GERD, goiter who presents for evaluation of abnormal lab. Was noted last week to feel fatigue, myalgias, cough, low grade temp at home. COVID negative. Has some labs at PCP which showed ARF. Apparently had some decreased urine last week. Possible BL flank pain which has since resolved per significant other? No recent injuries. No fever, HA, numbness, difficulty with word finding, CP, SOB, Abd pain, diarrhea, hematuria, unilateral weakness.  Family does state patient has had some generalized fatigue and some difficulty with swallowing over the last few months. Occasionally feels like food gets stuck in his throat however he is able to tolerate p.o. intake.  He is not followed by GI and has not been diagnosed with strictures previously.  Denies any current pain.  Denies additional aggravating or alleviating factors.  He feels like he is possibly lost some weight however is not able to quantify an amount. Family is concerned about his tyroid function.  History obtained from patient, family in room and past medical history. No interpretor was used.  HPI     Past Medical History:  Diagnosis Date   Chest pain 02/13/2012   echo - EF >55%;mod concentric L ventricular hypertrophy; atrial septum aneurysmal; patent foramen ovale suspected on color flow, LA mil/mod dilation; RV systolic pressure [id number]   Chest pain 06/09/2007   echo - EF 45-50%; interarterial shunt; mild valvular regurgitation;    Chest pain 06/09/2007   Myoview - EF 53%; normal perfusion all regions;EKG negative for ischemia    Diabetes mellitus    type II   DIABETES MELLITUS, TYPE II 08/29/2009   GERD (gastroesophageal reflux disease) 08/12/2010   Hemorrhoids    History of anal  fissures    Hyperlipidemia    HYPERLIPIDEMIA 08/29/2009   Hypertension    HYPERTENSION 01/14/2008   Lightheadedness 05/10/2002   30d monitor unremarkable   LVH (left ventricular hypertrophy)    with systolic dysfunction by echo 10/03, EF 45-50% - Dr. Melvern Banker   Multinodular goiter 12/06/2015   Nonspecific ST-T wave electrocardiographic changes 08/11/2006   Myoview - EF 49%; normal perfusion all regions, EKG negative for ischemia   OSA (obstructive sleep apnea)    Preventative health care 08/11/2010   Sleep apnea 08/13/2008   AHI 1.2; sleep study 10/2007 - AHI 9.47at 15cm H2O and 31.3 at [place], OBSTRUCTIVE 01/14/2008    Patient Active Problem List   Diagnosis Date Noted   AKI (acute kidney injury) ([place]) 02/06/2021   Hallux limitus of left foot 07/17/2020   Capsulitis 09/13/2019   Acute gouty arthritis 08/02/2019   Dysphagia 12/20/2018   Bursitis of right elbow 09/14/2018   Renal insufficiency 12/10/2017   Encounter for well adult exam with abnormal findings 12/10/2017   Multinodular goiter 12/06/2015   Loss of weight 11/21/2014   Tobacco abuse 08/16/2012   Anal fissure 06/19/2011   Hemorrhoids 06/12/2011   GERD (gastroesophageal reflux disease) 08/12/2010   Diabetes ([place]) 08/29/2009   Hyperlipidemia 08/29/2009   SLEEP APNEA, OBSTRUCTIVE 01/14/2008   Essential hypertension 01/14/2008    Past Surgical History:  Procedure Laterality Date   ANAL SPHINCTEROTOMY  08/20/11   CARDIAC CATHETERIZATION  10/23/2008   normal coronary arteries and LV  function   COLONOSCOPY     POLYPECTOMY         Family History  Problem Relation Age of Onset   Hypertension Mother    Heart disease Father    Colon cancer Neg Hx    Thyroid disease Neg Hx     Social History   Tobacco Use   Smoking status: Every Day    Packs/day: 0.50    Types: Cigarettes   Smokeless tobacco: Never  Substance Use Topics   Alcohol use: Yes    Alcohol/week: 3.0 standard drinks    Types: 3 Standard  drinks or equivalent per week    Comment: occaisionally   Drug use: No    Home Medications Prior to Admission medications   Medication Sig Start Date End Date Taking? Authorizing Provider  amLODipine-benazepril (LOTREL) 10-40 MG capsule TAKE 1 CAPSULE BY MOUTH EVERY DAY Patient taking differently: Take 1 capsule by mouth daily. 07/03/20  Yes Biagio Borg, MD  hydrALAZINE (APRESOLINE) 50 MG tablet Take 1 tablet (50 mg total) by mouth 3 (three) times daily. 09/11/20  Yes Biagio Borg, MD  hydrochlorothiazide (HYDRODIURIL) 25 MG tablet TAKE 1 TABLET BY MOUTH EVERY DAY Patient taking differently: Take 25 mg by mouth daily. 01/17/21  Yes Biagio Borg, MD  JANUVIA 100 MG tablet TAKE 1 TABLET BY MOUTH EVERY DAY Patient taking differently: Take 100 mg by mouth daily. 09/11/20  Yes Biagio Borg, MD  metFORMIN (GLUCOPHAGE-XR) 500 MG 24 hr tablet TAKE 2 TABLETS BY MOUTH EVERY DAY WITH BREAKFAST Patient taking differently: Take 1,000 mg by mouth daily with breakfast. 12/24/20  Yes Biagio Borg, MD  nebivolol (BYSTOLIC) 10 MG tablet TAKE 2 TABLETS (20 MG TOTAL) BY MOUTH DAILY. Patient taking differently: Take 20 mg by mouth daily. 09/21/20  Yes Biagio Borg, MD  omeprazole (PRILOSEC) 40 MG capsule TAKE 1 CAPSULE BY MOUTH EVERY DAY Patient taking differently: Take 40 mg by mouth daily. 09/26/20  Yes Biagio Borg, MD  potassium chloride (KLOR-CON M10) 10 MEQ tablet Take 2 tablets (20 mEq total) by mouth daily. Must keep scheduled appt in June for future refills Patient taking differently: Take 10 mEq by mouth daily. 07/27/19  Yes Biagio Borg, MD  pravastatin (PRAVACHOL) 80 MG tablet Take 1 tablet (80 mg total) by mouth daily. 09/11/20  Yes Biagio Borg, MD  Blood Glucose Monitoring Suppl (ONE TOUCH ULTRA 2) w/Device KIT Use as directed  E11.9 12/11/16   Biagio Borg, MD  Lancets MISC Use as directed E11.9 12/11/16   Biagio Borg, MD  meloxicam (MOBIC) 15 MG tablet Take 1 tablet (15 mg total) by mouth  daily. Patient not taking: Reported on 02/07/2021 08/14/20   Gardiner Barefoot, DPM    Allergies    Sulfa antibiotics and Vicodin [hydrocodone-acetaminophen]  Review of Systems   Review of Systems  Constitutional:  Positive for activity change, appetite change and fatigue.  HENT: Negative.    Respiratory:  Positive for cough (resolved).   Cardiovascular: Negative.   Gastrointestinal: Negative.   Genitourinary: Negative.   Musculoskeletal:  Positive for myalgias. Negative for arthralgias, back pain, gait problem, joint swelling, neck pain and neck stiffness.  Skin: Negative.   Neurological:  Positive for weakness (Generalized). Negative for dizziness, tremors, seizures, syncope, facial asymmetry, speech difficulty, light-headedness, numbness and headaches.  All other systems reviewed and are negative.  Physical Exam Updated Vital Signs BP 132/82   Pulse 70   Temp 98.6 F (  37 C) (Oral)   Resp 16   SpO2 97%   Physical Exam Vitals and nursing note reviewed.  Constitutional:      General: He is not in acute distress.    Appearance: He is well-developed. He is not ill-appearing, toxic-appearing or diaphoretic.  HENT:     Head: Normocephalic and atraumatic.     Jaw: There is normal jaw occlusion.     Nose: Nose normal.     Mouth/Throat:     Mouth: Mucous membranes are moist.     Pharynx: Oropharynx is clear. Uvula midline.     Comments: PO clear no edema Eyes:     Pupils: Pupils are equal, round, and reactive to light.  Neck:     Trachea: Trachea and phonation normal.  Cardiovascular:     Rate and Rhythm: Normal rate and regular rhythm.     Pulses: Normal pulses.          Radial pulses are 2+ on the right side and 2+ on the left side.       Dorsalis pedis pulses are 2+ on the right side and 2+ on the left side.     Heart sounds: Normal heart sounds.  Pulmonary:     Effort: Pulmonary effort is normal. No respiratory distress.     Breath sounds: Normal breath sounds and air  entry.     Comments: Clear, speaks in full sentences without difficulty Abdominal:     General: Bowel sounds are normal. There is no distension.     Palpations: Abdomen is soft.     Tenderness: There is no abdominal tenderness. There is no right CVA tenderness, left CVA tenderness, guarding or rebound.     Hernia: No hernia is present.     Comments: Soft non tender  Musculoskeletal:        General: No swelling, tenderness, deformity or signs of injury. Normal range of motion.     Cervical back: Normal range of motion and neck supple.     Right lower leg: No edema.     Left lower leg: No edema.     Comments: No bony tenderness. Full ROM without difficulty   Lymphadenopathy:     Cervical: No cervical adenopathy.  Skin:    General: Skin is warm and dry.     Capillary Refill: Capillary refill takes less than 2 seconds.  Neurological:     General: No focal deficit present.     Mental Status: He is alert and oriented to person, place, and time.     Cranial Nerves: Cranial nerves 2-12 are intact. No cranial nerve deficit.     Sensory: Sensation is intact. No sensory deficit.     Motor: No weakness.     Coordination: Coordination is intact. Coordination normal.     Gait: Gait is intact. Gait normal.     Comments: CN 2-12 grossly intact Ambulatory without ataxia Equal strength and sensation    ED Results / Procedures / Treatments   Labs (all labs ordered are listed, but only abnormal results are displayed) Labs Reviewed  CBC WITH DIFFERENTIAL/PLATELET - Abnormal; Notable for the following components:      Result Value   RBC 3.83 (*)    Hemoglobin 12.7 (*)    HCT 36.3 (*)    All other components within normal limits  COMPREHENSIVE METABOLIC PANEL - Abnormal; Notable for the following components:   Potassium 2.7 (*)    Glucose, Bld 121 (*)    Creatinine, Ser  1.50 (*)    GFR, Estimated 53 (*)    All other components within normal limits  TSH - Abnormal; Notable for the following  components:   TSH 0.214 (*)    All other components within normal limits  RESP PANEL BY RT-PCR (FLU A&B, COVID) ARPGX2  LIPASE, BLOOD  URINALYSIS, ROUTINE W REFLEX MICROSCOPIC  T4, FREE  CK  POTASSIUM  CBG MONITORING, ED    EKG EKG Interpretation  Date/Time:  Thursday February 07 2021 07:34:04 EST Ventricular Rate:  67 PR Interval:  194 QRS Duration: 88 QT Interval:  404 QTC Calculation: 426 R Axis:   47 Text Interpretation: Normal sinus rhythm Minimal voltage criteria for LVH, may be normal variant ( Sokolow-Lyon ) Nonspecific T wave abnormality Abnormal ECG Confirmed by Lavenia Atlas [phone]) on 02/07/2021 9:39:36 AM  Radiology DG Chest 2 View  Result Date: 02/07/2021 CLINICAL DATA:  60 year old male with history of fever and chills since yesterday. EXAM: CHEST - 2 VIEW COMPARISON:  Chest x-ray 04/23/2018. FINDINGS: Lung volumes are normal. No consolidative airspace disease. No pleural effusions. No pneumothorax. No pulmonary nodule or mass noted. Pulmonary vasculature and the cardiomediastinal silhouette are within normal limits. IMPRESSION: No radiographic evidence of acute cardiopulmonary disease. Electronically Signed   By: Vinnie Langton M.D.   On: 02/07/2021 07:31   US Renal  Result Date: 02/07/2021 CLINICAL DATA:  Acute kidney injury.  Left flank pain. EXAM: RENAL / URINARY TRACT ULTRASOUND COMPLETE COMPARISON:  None. FINDINGS: Right Kidney: Renal measurements: 12.1 x 4.4 x 5.9 cm = volume: 160 for mL. Increased echogenicity of the renal parenchyma. Cystic and solid abnormality of the mid to lower pole measuring 2.1 x 1.9 x 1.4 cm. CT scan with contrast recommended for further evaluation of this. Left Kidney: Renal measurements: 12.4 x 7.2 x 4.7 cm = volume: 222 mL. Increased echogenicity of the renal parenchyma. No hydronephrosis or focal lesion. Bladder: Mild bladder wall thickening.  Ureteral jets not demonstrated. Other: None. IMPRESSION: Mildly enlarged echogenic  kidneys that suggest acute nephritis or renal swelling. No evidence of obstruction. 2.1 x 1.9 x 1.4 cm mixed cystic and solid lesion of the right kidney that could represent a tumor. Evaluation with either contrast-enhanced CT or renal MRI is suggested. In the setting of acute kidney injury, MRI would be favored. Electronically Signed   By: Nelson Chimes M.D.   On: 02/07/2021 11:06    Procedures Procedures   Medications Ordered in ED Medications  potassium chloride SA (KLOR-CON) CR tablet 40 mEq (40 mEq Oral Given 02/07/21 0920)  sodium chloride 0.9 % bolus 1,000 mL (0 mLs Intravenous Stopped 02/07/21 1045)  potassium chloride 10 mEq in 100 mL IVPB (0 mEq Intravenous Stopped 02/07/21 1159)  potassium chloride 10 mEq in 100 mL IVPB (0 mEq Intravenous Stopped 02/07/21 1300)    ED Course  I have reviewed the triage vital signs and the nursing notes.  Pertinent labs & imaging results that were available during my care of the patient were reviewed by me and considered in my medical decision making (see chart for details).  Here for evaluation of abnormal labs.  He is afebrile, nonseptic, not ill-appearing.  Sounds like last week when he had some sort of likely viral infection, myalgias, cough, fatigue.  Was seen by PCP noted to have acute kidney injury.  Sent here for further evaluation.  Review his labs from his PCP which showed kidney function 2.4 baseline 1.3 in the Epic.  Heart and lungs clear.  Abdomen soft, tender.  Has nonfocal neuro exam without deficits. Plan on labs, imaging and reassess.  Labs and imaging personally reviewed and interpreted:  CBC without leukocytosis, hemoglobin 12.7> similar to prior Lipase 28 CK 182 Metabolic panel potassium 2.7, creatinine 1.5, improved from PCP office COVID, flu negative TSH 0.2, free T4 0.5 Repeat potassium 3.5 after replacement Ultrasound renal with possible nephritis bilaterally.  Possible mass kidney, recommend CT scan with contrast or  MRI Chest x-ray without significant abnormality EKG without acute abnormality   CONSULT with Dr. Royce Macadamia with Nephrology, Rec  Potassium replacement (current infusing) FU with PCP for repeat renal function. FU with Urology for possible mass, does not recommend contrast studies here due to kidney function, this can be done outpatient. Close outpatient FU for repeat labs in 1 week.  Patient reassessed.  Tolerating p.o. intake.  Discussed labs and imaging with patient and family in room.  Discussed close outpatient follow-up.  They are agreeable.  The patient has been appropriately medically screened and/or stabilized in the ED. I have low suspicion for any other emergent medical condition which would require further screening, evaluation or treatment in the ED or require inpatient management.  Patient is hemodynamically stable and in no acute distress.  Patient able to ambulate in department prior to ED.  Evaluation does not show acute pathology that would require ongoing or additional emergent interventions while in the emergency department or further inpatient treatment.  I have discussed the diagnosis with the patient and answered all questions.  Pain is been managed while in the emergency department and patient has no further complaints prior to discharge.  Patient is comfortable with plan discussed in room and is stable for discharge at this time.  I have discussed strict return precautions for returning to the emergency department.  Patient was encouraged to follow-up with PCP/specialist refer to at discharge.     MDM Rules/Calculators/A&P                            Final Clinical Impression(s) / ED Diagnoses Final diagnoses:  AKI (acute kidney injury) ([place])  Hypokalemia  Other fatigue    Rx / DC Orders ED Discharge Orders     None        Sareena Odeh A, PA-C 02/07/21 1517    Horton, Alvin Critchley, DO 02/13/21 1409

## 2021-02-07 NOTE — Discharge Instructions (Signed)
Kidney function has improved.  I discussed in the room he did have some inflammation of your kidneys.  The nephrologist I spoke with recommends follow-up with your primary care doctor to have repeat labs in 1 week.  Your potassium was also low however after giving you IV and oral supplementation this was repeated and is now normal.  Also has discussed in the room you have a possible mass on your kidney.  It is unclear what the exact positive this is.  I would follow-up outpatient with urology.  The number is listed on your discharge instructions.  Please call to schedule an appointment.

## 2021-02-07 NOTE — ED Notes (Signed)
Patient transported to Ultrasound 

## 2021-02-07 NOTE — ED Provider Notes (Addendum)
Emergency Medicine Provider Triage Evaluation Note  [name] , a 60 y.o. male  was evaluated in triage.  Pt complains of abnormal lab.  Seen by PCP yesterday.  Noted to be in acute renal failure.  Patient states he was initially seen at PCP due to extreme fatigue, decreased p.o. intake, low-grade fevers at home.  Sometimes feels like when he goes to swallow get stuck in his throat. Has some chills, early satiety. Lost several pounds however cannot quantify. No changes in urinarion  Review of Systems  Positive: Chills, acute renal failure, weight loss, early satiety Negative: CP, SOB, abd pain  Physical Exam  There were no vitals taken for this visit. Gen:   Awake, no distress   Resp:  Normal effort  MSK:   Moves extremities without difficulty  Other:    Medical Decision Making  Medically screening exam initiated at 7:09 AM.  Appropriate orders placed.  [name] was informed that the remainder of the evaluation will be completed by another provider, this initial triage assessment does not replace that evaluation, and the importance of remaining in the ED until their evaluation is complete.  Abnormal labs      Eyal Greenhaw A, PA-C 02/07/21 6950    Lorelle Gibbs, DO 02/07/21 1057

## 2021-02-07 NOTE — ED Triage Notes (Signed)
Pt/wife stated, For about a week Ive been fatigue, having cold chills, and dehydrated. Everyday from work Ive come home and went to bed to due to being tired.

## 2021-02-07 NOTE — ED Triage Notes (Signed)
Dr. Cathlean Cower sent him over for renal failure due to abnormal labs.

## 2021-02-07 NOTE — ED Notes (Signed)
Documentation for discharge on the wrong pt.

## 2021-02-07 NOTE — ED Notes (Signed)
2sst sent to lab for save

## 2021-02-13 ENCOUNTER — Other Ambulatory Visit (INDEPENDENT_AMBULATORY_CARE_PROVIDER_SITE_OTHER): Payer: 59

## 2021-02-13 ENCOUNTER — Encounter: Payer: Self-pay | Admitting: Internal Medicine

## 2021-02-13 ENCOUNTER — Other Ambulatory Visit: Payer: Self-pay | Admitting: Internal Medicine

## 2021-02-13 DIAGNOSIS — N179 Acute kidney failure, unspecified: Secondary | ICD-10-CM

## 2021-02-13 LAB — BASIC METABOLIC PANEL
BUN: 17 mg/dL (ref 6–23)
CO2: 30 mEq/L (ref 19–32)
Calcium: 9.1 mg/dL (ref 8.4–10.5)
Chloride: 104 mEq/L (ref 96–112)
Creatinine, Ser: 1.38 mg/dL (ref 0.40–1.50)
GFR: 55.1 mL/min — ABNORMAL LOW (ref >60.00)
Glucose, Bld: 91 mg/dL (ref 70–99)
Potassium: 3.2 mEq/L — ABNORMAL LOW (ref 3.5–5.1)
Sodium: 144 mEq/L (ref 135–145)

## 2021-02-13 MED ORDER — POTASSIUM CHLORIDE CRYS ER 10 MEQ PO TBCR: 10.0000 meq | EXTENDED_RELEASE_TABLET | Freq: Every day | ORAL | 3 refills | Status: DC

## 2021-02-13 MED ORDER — HYDROCHLOROTHIAZIDE 25 MG PO TABS: 25.0000 mg | ORAL_TABLET | Freq: Every day | ORAL | 3 refills | Status: DC

## 2021-03-06 ENCOUNTER — Other Ambulatory Visit: Payer: Self-pay

## 2021-03-06 ENCOUNTER — Ambulatory Visit
Admission: RE | Admit: 2021-03-06 | Discharge: 2021-03-06 | Disposition: A | Discharge status: Home / Self Care | Payer: 59 | Source: Ambulatory Visit | Attending: Internal Medicine | Admitting: Internal Medicine

## 2021-03-06 DIAGNOSIS — R1900 Intra-abdominal and pelvic swelling, mass and lump, unspecified site: Secondary | ICD-10-CM

## 2021-03-06 IMAGING — MR MR ABDOMEN WO/W CM
12 of 19 series · 27 of 48 positions shown · IV contrast (multihance)
Comparison: Ultrasound [DATE]

CLINICAL DATA: Right renal mass on ultrasound of [DATE]

EXAM:
MRI ABDOMEN WITHOUT AND WITH CONTRAST
TECHNIQUE: Multiplanar multisequence MR imaging of the abdomen was performed
both before and after the administration of intravenous contrast.
CONTRAST:  17mL MULTIHANCE GADOBENATE DIMEGLUMINE 529 MG/ML IV SOLN

[Series 3: cor haste · coronal · 5.0mm · 0.68mm/px · 2 of 35 slices shown]
[im 1/35]
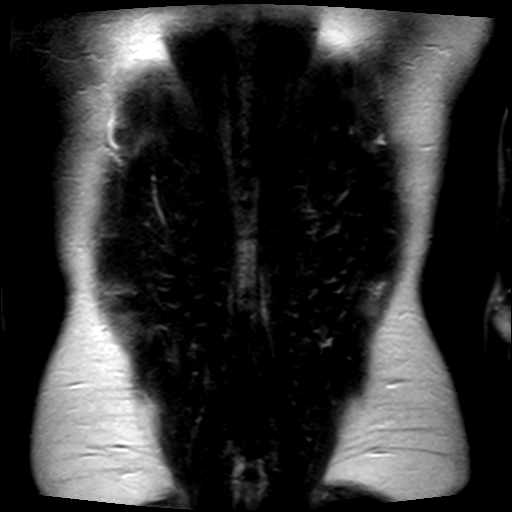
[im 35/35]
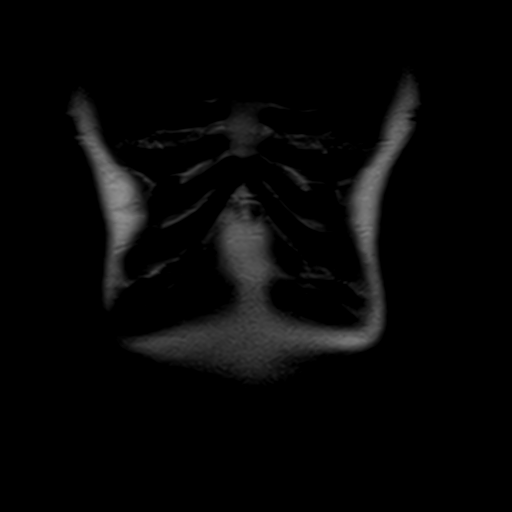

[Series 4: axial haste · axial · 6.0mm · 0.68mm/px · z∈[-130,+94]mm · 2 of 35 slices shown]
[im 1/35]
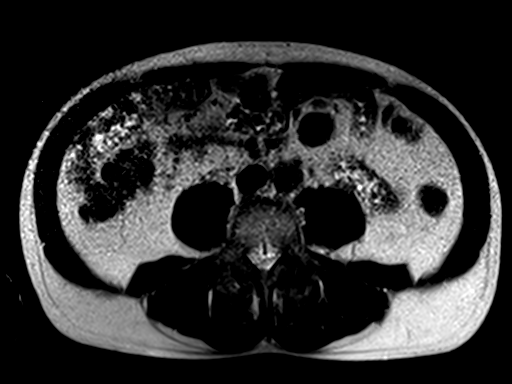
[im 35/35]
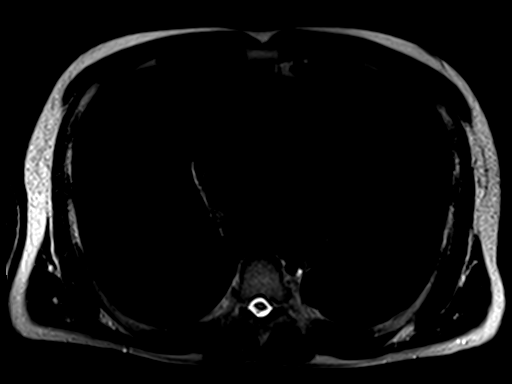

[Series 5: T1 · axial · 6.0mm · 0.68mm/px · z∈[-130,+94]mm · 3 of 70 slices shown (1 of 2)]
[im 1/70]
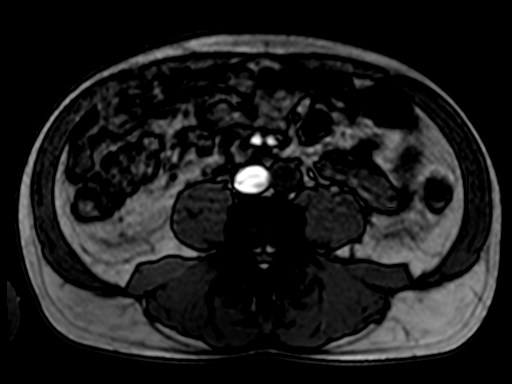
[im 35/70]
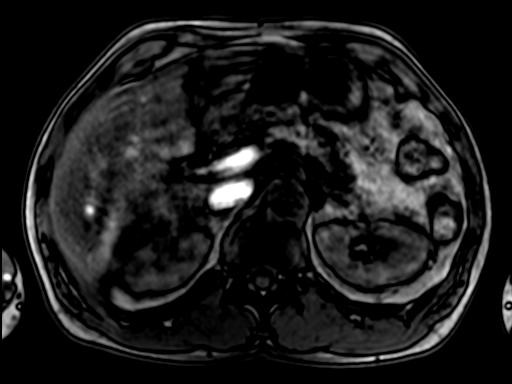
[im 70/70]
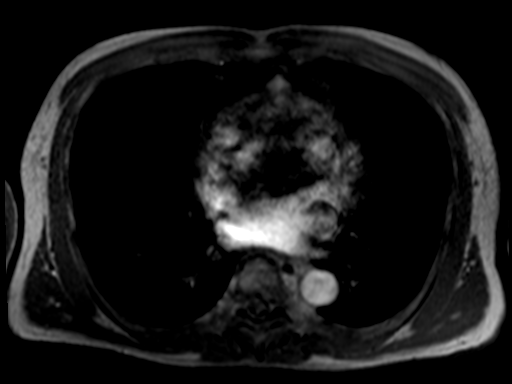

[Series 6: bSSFP · axial · 4.0mm · 0.68mm/px · z∈[-138,+102]mm · 2 of 61 slices shown]
[im 1/61]
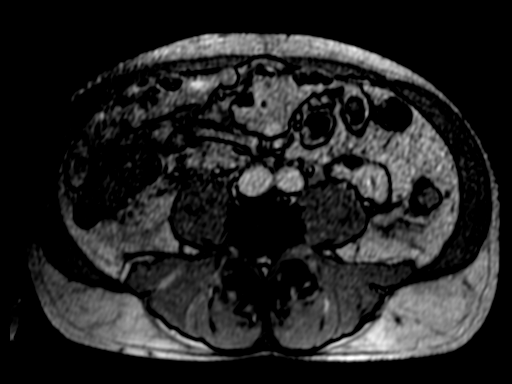
[im 61/61]
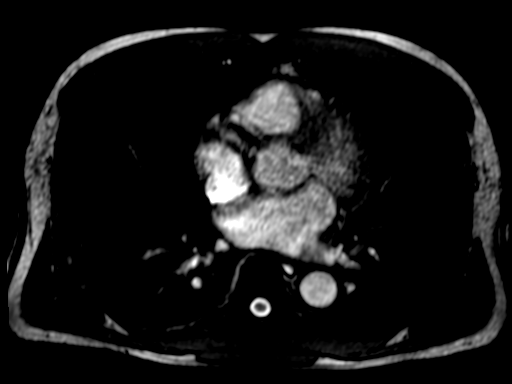

[Series 7: T2 fat-sat · axial · 6.0mm · 1.09mm/px · 1 of 35 slices shown]
[im 1/35]
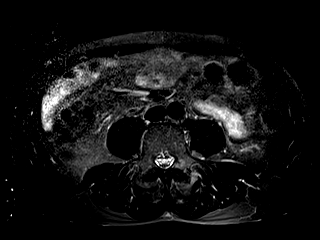

[Series 8: axial haste repeat · axial · 6.0mm · 0.68mm/px · 1 of 35 slices shown]
[im 1/35]
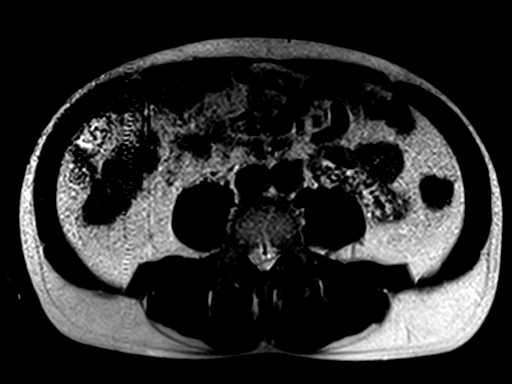

[Series 9: ep2d_[id number]_500_800_p2_trig · axial · 6.0mm · 1.82mm/px · z∈[-127,+118]mm · 4 of 105 slices shown]
[im 1/105]
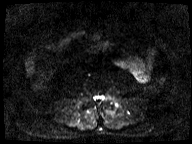
[im 35/105]
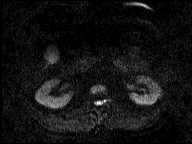
[im 70/105]
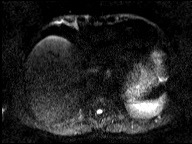
[im 105/105]
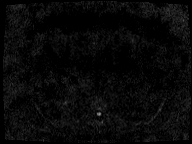

[Series 10: ep2d_[id number]_500_800_p2_trig_adc · axial · 6.0mm · 1.82mm/px · 1 of 35 slices shown]
[im 1/35]
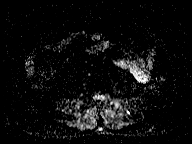

[Series 11: T1 · axial · 6.0mm · 0.68mm/px · z∈[-130,+94]mm · 2 of 70 slices shown (2 of 2)]
[im 1/70]
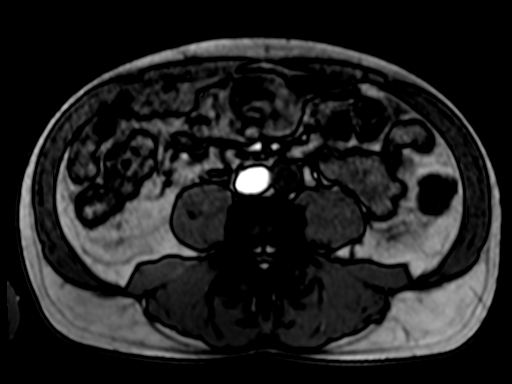
[im 70/70]
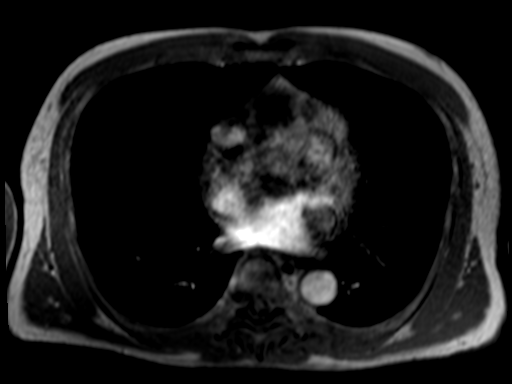

[Series 12: T1 dynamic · axial · non-contrast · 2.5mm · 0.74mm/px · z∈[-127,+91]mm · 3 of 88 slices shown]
[im 1/88]
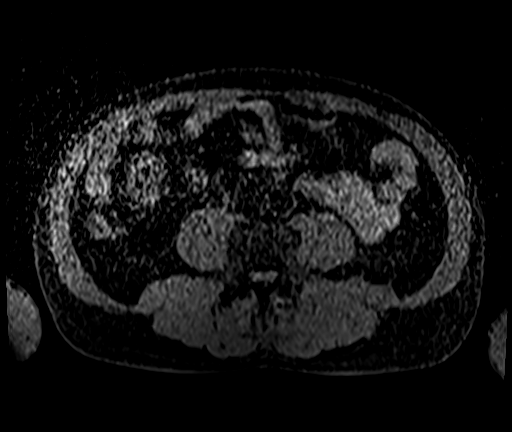
[im 44/88]
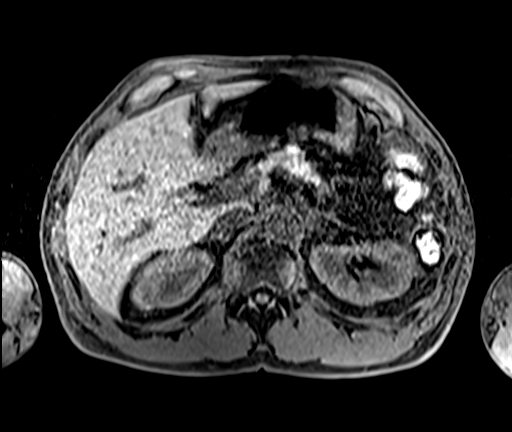
[im 88/88]
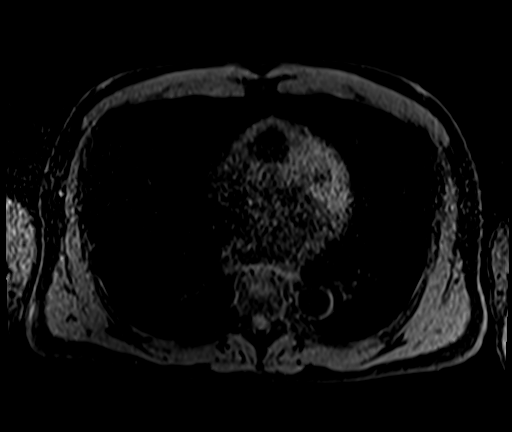

[Series 13: T1 dynamic post-contrast · axial · 2.5mm · 0.74mm/px · z∈[-127,+91]mm · 3 of 88 slices shown (1 of 2)]
[im 1/88]
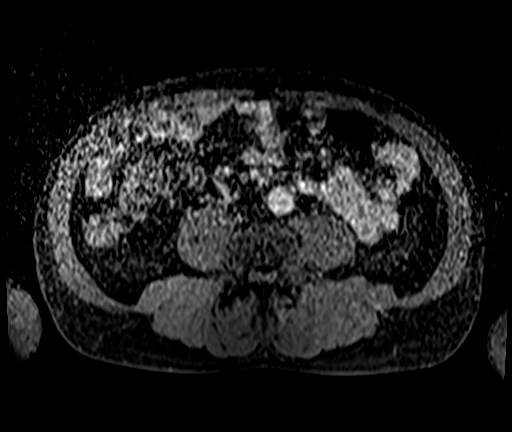
[im 44/88]
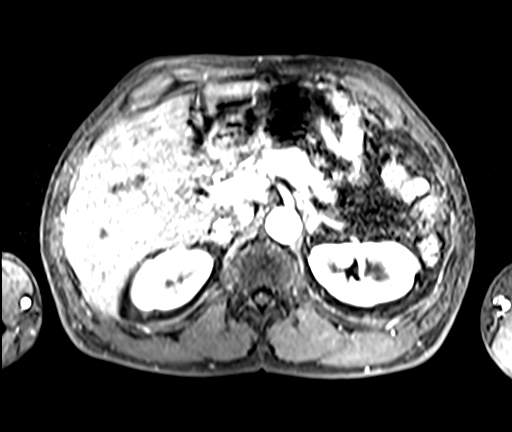
[im 88/88]
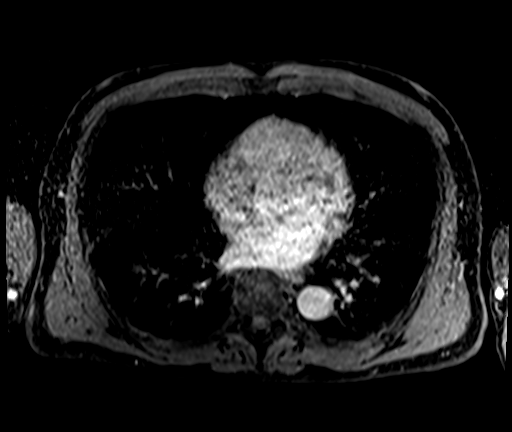

[Series 14: T1 dynamic post-contrast · axial · 2.5mm · 0.74mm/px · z∈[-127,+91]mm · 3 of 88 slices shown (2 of 2)]
[im 1/88]
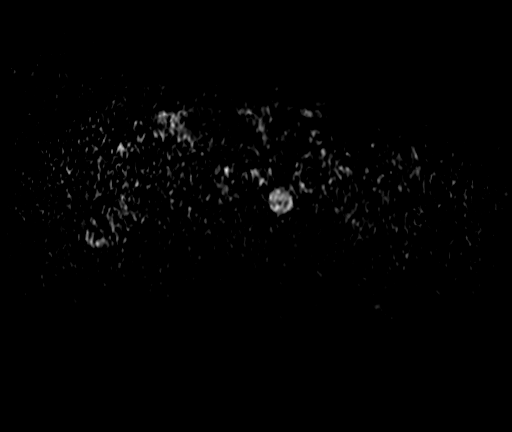
[im 44/88]
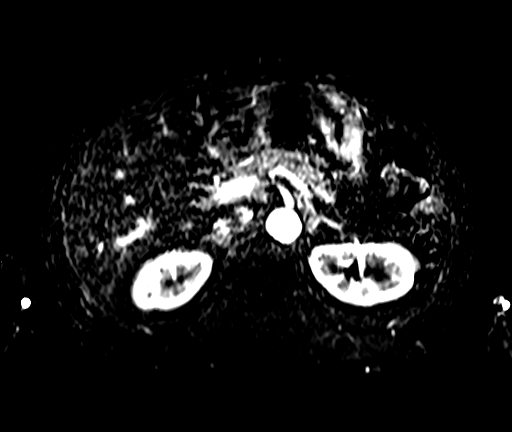
[im 88/88]
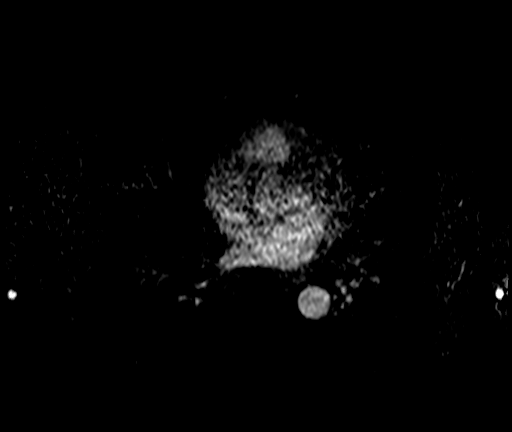

[27 of 48 positions shown; findings below may reference images not displayed]

FINDINGS: Despite efforts by the technologist and patient, substantial motion
artifact is present on today's exam and could not be eliminated.
This reduces exam sensitivity and specificity.

Lower chest: Unremarkable

Hepatobiliary: Small cyst in segment 2 of the liver. Gallbladder
unremarkable. No biliary dilatation. No significant focal hepatic
lesion is identified.

Pancreas:  Unremarkable

Spleen:  Unremarkable

Adrenals/Urinary Tract:  Both adrenal glands appear normal.

The lesion of concern in the right kidney lower pole is felt to
contain at least 4 septations which are generally fairly thin in the
2 mm thickness range. This makes this a Bosniak category IIF cystic
lesion according to [9A] Bosniak criteria.

1.1 by 1.0 cm enhancing lesion left kidney upper pole has low T2
precontrast signal characteristics and is shown for example on image
40 series 18. This is probably a small papillary renal cell
carcinoma. This lesion has restricted diffusion.

A small Bosniak category 1 cyst of the right kidney upper pole
measures 0.8 cm in diameter on image 18 series 7.

Stomach/Bowel: Unremarkable

Vascular/Lymphatic: No retroperitoneal adenopathy. No tumor thrombus
in either renal vein.

Other:  No supplemental non-categorized findings.

Musculoskeletal: Hemangioma in the left side of the L2 vertebral
body. Lumbar spondylosis and degenerative disc disease.
IMPRESSION: 1. 1.1 cm LEFT kidney upper pole posterior enhancing lesion with low
T2 signal precontrast imaging characteristics and restricted
diffusion. Appearance compatible with a small papillary renal cell
carcinoma.
2. The lesion of concern in the right kidney lower pole has about 4
septations which are in 2 mm thickness range. This makes this a
Bosniak category IIF cystic lesion according to the [9A] Bosniak
criteria. Generally, Bosniak IIF masses are followed by [HOSPITAL] 6
months and 12 months (MRI preferred over CT), then annually for a
total of 5 years to assess for morphologic change. (Reference:
Bosniak Classification of Cystic Renal Masses, Version [9A].
3. Lumbar spondylosis and degenerative disc disease.

## 2021-03-06 MED ORDER — GADOBENATE DIMEGLUMINE 529 MG/ML IV SOLN
17.0000 mL | Freq: Once | INTRAVENOUS | Status: AC | PRN
  Administered 2021-03-06: 17 mL via INTRAVENOUS

## 2021-03-07 ENCOUNTER — Encounter: Payer: Self-pay | Admitting: Internal Medicine

## 2021-04-11 ENCOUNTER — Other Ambulatory Visit: Payer: Self-pay | Admitting: Urology

## 2021-04-11 DIAGNOSIS — N2889 Other specified disorders of kidney and ureter: Secondary | ICD-10-CM

## 2021-04-12 ENCOUNTER — Ambulatory Visit
Admission: RE | Admit: 2021-04-12 | Discharge: 2021-04-12 | Disposition: A | Discharge status: Home / Self Care | Payer: 59 | Source: Ambulatory Visit | Attending: Urology | Admitting: Urology

## 2021-04-12 ENCOUNTER — Encounter: Payer: Self-pay | Admitting: *Deleted

## 2021-04-12 DIAGNOSIS — N2889 Other specified disorders of kidney and ureter: Secondary | ICD-10-CM

## 2021-04-12 HISTORY — PX: IR RADIOLOGIST EVAL & MGMT: IMG5224

## 2021-04-12 NOTE — H&P (Signed)
Chief Complaint: Patient was seen in consultation today for L renal mass at the request of [name]  Referring Physician(s): [name]  History of Present Illness:  [name] is a 61 y.o. male w PMHx significant for recent AKI of unknown etiology. Pt had presented to ER w "extreme" fatigue in 01/2021 and labs revealed sCr peak of 2.4 (baseline 1.3). Diagnostic workup included a renal US (02/07/21) which has an incidental 2.1 cm Perry renal cystic lesion which was recommended for workup with MRI. 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   He was referred to Urology and was seen by Dr Abner Greenspan. Pt was cautious of a surgical option for his small renal mass, and a referral to VIR was made to evaluate for minimally invasive treatment options.  Review of Systems: ROS was positive for intermittent flank pain. He denied dysuria. A 12 point ROS discussed and pertinent positives are indicated in the HPI above.   Past Medical History:  Diagnosis Date   Chest pain 02/13/2012   echo - EF >55%;mod concentric L ventricular hypertrophy; atrial septum aneurysmal; patent foramen ovale suspected on color flow, LA mil/mod dilation; RV systolic pressure 56YBWL   Chest pain 06/09/2007   echo - EF 45-50%; interarterial shunt; mild valvular regurgitation;    Chest pain 06/09/2007   Myoview - EF 53%; normal perfusion all regions;EKG negative for ischemia    Diabetes mellitus    type II   DIABETES MELLITUS, TYPE II 08/29/2009   GERD (gastroesophageal reflux disease) 08/12/2010   Hemorrhoids    History of anal fissures    Hyperlipidemia    HYPERLIPIDEMIA 08/29/2009   Hypertension    HYPERTENSION 01/14/2008   Lightheadedness 05/10/2002   30d monitor unremarkable   LVH (left ventricular hypertrophy)    with systolic dysfunction by echo 10/03, EF  45-50% - Dr. Melvern Banker   Multinodular goiter 12/06/2015   Nonspecific ST-T wave electrocardiographic changes 08/11/2006   Myoview - EF 49%; normal perfusion all regions, EKG negative for ischemia   OSA (obstructive sleep apnea)    Preventative health care 08/11/2010   Sleep apnea 08/13/2008   AHI 1.2; sleep study 10/2007 - AHI 9.47at 15cm H2O and 31.3 at [place], OBSTRUCTIVE 01/14/2008    Past Surgical History:  Procedure Laterality Date   ANAL SPHINCTEROTOMY  08/20/11   CARDIAC CATHETERIZATION  10/23/2008   normal coronary arteries and LV function   COLONOSCOPY     IR RADIOLOGIST EVAL & MGMT  04/12/2021   POLYPECTOMY      Allergies: Sulfa antibiotics and Vicodin [hydrocodone-acetaminophen]  Medications: Prior to Admission medications   Medication Sig Start Date End Date Taking? Authorizing Provider  amLODipine-benazepril (LOTREL) 10-40 MG capsule TAKE 1 CAPSULE BY MOUTH EVERY DAY Patient taking differently: Take 1 capsule by mouth daily. 07/03/20   Biagio Borg, MD  Blood Glucose Monitoring Suppl (ONE TOUCH ULTRA 2) w/Device KIT Use as directed  E11.9 12/11/16   Biagio Borg, MD  hydrALAZINE (APRESOLINE) 50 MG tablet Take 1 tablet (50 mg total) by mouth 3 (three) times daily. 09/11/20   Biagio Borg, MD  hydrochlorothiazide (HYDRODIURIL) 25 MG tablet Take 1 tablet (25 mg total) by mouth daily. 02/13/21   Biagio Borg, MD  JANUVIA 100 MG tablet TAKE 1 TABLET BY MOUTH  EVERY DAY Patient taking differently: Take 100 mg by mouth daily. 09/11/20   Biagio Borg, MD  Lancets MISC Use as directed E11.9 12/11/16   Biagio Borg, MD  meloxicam (MOBIC) 15 MG tablet Take 1 tablet (15 mg total) by mouth daily. Patient not taking: Reported on 02/07/2021 08/14/20   Gardiner Barefoot, DPM  metFORMIN (GLUCOPHAGE-XR) 500 MG 24 hr tablet TAKE 2 TABLETS BY MOUTH EVERY DAY WITH BREAKFAST Patient taking differently: Take 1,000 mg by mouth daily with breakfast. 12/24/20   Biagio Borg, MD  nebivolol  (BYSTOLIC) 10 MG tablet TAKE 2 TABLETS (20 MG TOTAL) BY MOUTH DAILY. Patient taking differently: Take 20 mg by mouth daily. 09/21/20   Biagio Borg, MD  omeprazole (PRILOSEC) 40 MG capsule TAKE 1 CAPSULE BY MOUTH EVERY DAY Patient taking differently: Take 40 mg by mouth daily. 09/26/20   Biagio Borg, MD  potassium chloride (KLOR-CON M10) 10 MEQ tablet Take 1 tablet (10 mEq total) by mouth daily. 02/13/21   Biagio Borg, MD  pravastatin (PRAVACHOL) 80 MG tablet Take 1 tablet (80 mg total) by mouth daily. 09/11/20   Biagio Borg, MD     Family History  Problem Relation Age of Onset   Hypertension Mother    Heart disease Father    Colon cancer Neg Hx    Thyroid disease Neg Hx     Social History   Socioeconomic History   Marital status: Single    Spouse name: Not on file   Number of children: 1   Years of education: Not on file   Highest education level: Not on file  Occupational History   Occupation: work as truck Education administrator: [employer]  Tobacco Use   Smoking status: Every Day    Packs/day: 0.50    Types: Cigarettes   Smokeless tobacco: Never  Substance and Sexual Activity   Alcohol use: Yes    Alcohol/week: 3.0 standard drinks    Types: 3 Standard drinks or equivalent per week    Comment: occaisionally   Drug use: No   Sexual activity: Not on file  Other Topics Concern   Not on file  Social History Narrative   Not on file   Social Determinants of Health   Financial Resource Strain: Not on file  Food Insecurity: Not on file  Transportation Needs: Not on file  Physical Activity: Not on file  Stress: Not on file  Social Connections: Not on file    Review of Systems As above  Vital Signs: BP 111/70 (BP Location: Right Arm)    Pulse 74    SpO2 99%   Physical Exam  General: WD / WN, NAD  CV: RRR  Pulm: normal work of breathing on RA Abd:  S, ND, NT MSK: Grossly normal Psych: Appropriate affect.  Imaging:  MR Abdomen: 03/06/21 Imaging  independely reviewed, demonstrating a small enhancing posterior L upper renal pole mass. Measures up to 1.3 cm.    Labs:  CBC: Recent Labs    09/10/20 0856 02/07/21 0717  WBC 6.9 5.8  HGB 13.8 12.7*  HCT 40.5 36.3*  PLT 258.0 241    COAGS: No results for input(s): INR, APTT in the last 8760 hours.  BMP: Recent Labs    09/10/20 0856 02/05/21 1624 02/07/21 0717 02/07/21 1305 02/13/21 1624  NA 141 142 140  --  144  K 3.1* 3.2* 2.7* 3.5 3.2*  CL 99 105 105  --  104  CO2 30 26 24   --  30  GLUCOSE 98 93 121*  --  91  BUN 17 40* 22  --  17  CALCIUM 9.6 9.7 9.4  --  9.1  CREATININE 1.29 2.43* 1.50*  --  1.38  GFRNONAA  --   --  53*  --   --     LIVER FUNCTION TESTS: Recent Labs    09/10/20 0856 02/05/21 1624 02/07/21 0717  BILITOT 0.7 0.5 1.0  AST 14 17 18   ALT 12 11 11   ALKPHOS 70 59 57  PROT 7.2 7.3 6.6  ALBUMIN 4.5 4.7 3.9     Assessment and Plan:  Assessment   Plan: Mr.Burnice Toon is a 61 y.o. 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  He is interested in pursuing a minimally-invasive option for the treatment of her renal cancer at this time, and is curious about RENAL MASS ABLATION. Review of systems is otherwise negative.  The procedure has been fully reviewed with the patient/patients authorized representative and they have consented to the procedure. The risks, benefits and alternatives have been explained including, but not limited to, failure to treat entire lesion, bleeding, infection, damage to adjacent structures, hematuria, urine leak, decrease in renal function or post procedural neuropathy.  *MRI (03/06/21) already performed and reviewed. No additional imaging required. *Proceed to schedule based on mutual availability. *Same day procedure, no overnight admission.  Thank you for this interesting consult.  I greatly enjoyed meeting [employer] and look forward to participating in their care.  A  copy of this report was sent to the requesting provider on this date.  Electronically Signed:  Michaelle Birks, MD [facility]   Pager. [phone] Clinic. [phone]  I spent a total of  30 Minutes  in face to face in clinical consultation, more than half of this visit was spent counseling the patient and/or their family regarding proposed plan of care, outcomes, expectations and duration of illness.

## 2021-04-17 ENCOUNTER — Other Ambulatory Visit (HOSPITAL_COMMUNITY): Payer: Self-pay | Admitting: Interventional Radiology

## 2021-04-17 DIAGNOSIS — N2889 Other specified disorders of kidney and ureter: Secondary | ICD-10-CM

## 2021-04-24 NOTE — Progress Notes (Signed)
Sent message, via epic in basket, requesting orders in epic from surgeon.  

## 2021-04-29 NOTE — Patient Instructions (Signed)
DUE TO COVID-19 ONLY ONE VISITOR IS ALLOWED TO COME WITH YOU AND STAY IN THE WAITING ROOM ONLY DURING PRE OP AND PROCEDURE.   **NO VISITORS ARE ALLOWED IN THE SHORT STAY AREA OR RECOVERY ROOM!!**  IF YOU WILL BE ADMITTED INTO THE HOSPITAL YOU ARE ALLOWED ONLY TWO SUPPORT PEOPLE DURING VISITATION HOURS ONLY (7 AM -8PM)   The support person(s) must pass our screening, gel in and out, and wear a mask at all times, including in the patients room. Patients must also wear a mask when staff or their support person are in the room. Visitors GUEST BADGE MUST BE WORN VISIBLY  One adult visitor may remain with you overnight and MUST be in the room by 8 P.M.  No visitors under the age of 39. Any visitor under the age of 86 must be accompanied by an adult.    COVID SWAB TESTING MUST BE COMPLETED ON:  05/13/21 @ 8:00 AM   Site: [facility] [place] [facility] [place] [place] Enter: Main Entrance have a seat in the waiting area to the right of main entrance (DO NOT [place]!!!!!) Dial: [phone] to alert staff you have arrived  You are not required to quarantine, however you are required to wear a well-fitted mask when you are out and around people not in your household.  Hand Hygiene often Do NOT share personal items Notify your provider if you are in close contact with someone who has COVID or you develop fever 100.4 or greater, new onset of sneezing, cough, sore throat, shortness of breath or body aches.  [address], [address], must go inside of the hospital, NOT A DRIVE THRU!  (Must self quarantine after testing. Follow instructions on handout.)       Your procedure is scheduled on: 05/15/21   Report to [facility] Main Entrance    Report to admitting at : 9:45 AM   Call this number if you have problems the morning of surgery [phone]   Do not eat food :After Midnight.   May have liquids until  : 9:00 AM   day of surgery  CLEAR LIQUID DIET  Foods Allowed                                                                     Foods Excluded  Water, Black Coffee and tea, regular and decaf                             liquids that you cannot  Plain Jell-O in any flavor  (No red)                                           see through such as: Fruit ices (not with fruit pulp)                                     milk, soups, orange juice  Iced Popsicles (No red)                                    All solid food                                   Apple juices Sports drinks like Gatorade (No red) Lightly seasoned clear broth or consume(fat free) Sugar Sample Menu Breakfast                                Lunch                                     Supper Cranberry juice                    Beef broth                            Chicken broth Jell-O                                     Grape juice                           Apple juice Coffee or tea                        Jell-O                                      Popsicle                                                Coffee or tea                        Coffee or tea    Oral Hygiene is also important to reduce your risk of infection.                                    Remember - BRUSH YOUR TEETH THE MORNING OF SURGERY WITH YOUR REGULAR TOOTHPASTE   Do NOT smoke after Midnight   Take these medicines the morning of surgery with A SIP OF WATER: amlodipine,hydralazine,nebivolol,omeprazole. How to Manage Your Diabetes Before and After Surgery  Why is it important to control my blood sugar before and after surgery? Improving blood sugar levels before and after surgery helps healing and can limit problems. A way of improving blood sugar control is eating a healthy diet by:  Eating less sugar and carbohydrates  Increasing activity/exercise  Talking with your doctor about reaching your blood sugar goals High blood sugars (greater than  180 mg/dL) can raise your risk of infections and slow your recovery, so you will need  to focus on controlling your diabetes during the weeks before surgery. Make sure that the doctor who takes care of your diabetes knows about your planned surgery including the date and location.  How do I manage my blood sugar before surgery? Check your blood sugar at least 4 times a day, starting 2 days before surgery, to make sure that the level is not too high or low. Check your blood sugar the morning of your surgery when you wake up and every 2 hours until you get to the Short Stay unit. If your blood sugar is less than 70 mg/dL, you will need to treat for low blood sugar: Do not take insulin. Treat a low blood sugar (less than 70 mg/dL) with  cup of clear juice (cranberry or apple), 4 glucose tablets, OR glucose gel. Recheck blood sugar in 15 minutes after treatment (to make sure it is greater than 70 mg/dL). If your blood sugar is not greater than 70 mg/dL on recheck, call [phone] for further instructions. Report your blood sugar to the short stay nurse when you get to Short Stay.  If you are admitted to the hospital after surgery: Your blood sugar will be checked by the staff and you will probably be given insulin after surgery (instead of oral diabetes medicines) to make sure you have good blood sugar levels. The goal for blood sugar control after surgery is 80-180 mg/dL.   WHAT DO I DO ABOUT MY DIABETES MEDICATION?  Do not take oral diabetes medicines (pills) the morning of surgery.  THE DAY BEFORE SURGERY, take [place] and metformin as usual.      THE MORNING OF SURGERY, DO NOT TAKE ANY ORAL DIABETIC MEDICATIONS DAY OF YOUR SURGERY                              You may not have any metal on your body including hair pins, jewelry, and body piercing             Do not wear lotions, powders, perfumes/cologne, or deodorant              Men may shave face and neck.   Do not bring valuables  to the hospital. [place].   Contacts, dentures or bridgework may not be worn into surgery.   Bring small overnight bag day of surgery.    Patients discharged on the day of surgery will not be allowed to drive home.  Someone needs to stay with you for the first 24 hours after anesthesia.   Special Instructions: Bring a copy of your healthcare power of attorney and living will documents         the day of surgery if you haven't scanned them before.              Please read over the following fact sheets you were given: IF YOU HAVE QUESTIONS ABOUT YOUR PRE-OP INSTRUCTIONS PLEASE CALL [phone]     [facility] - Preparing for Surgery Before surgery, you can play an important role.  Because skin is not sterile, your skin needs to be as free of germs as possible.  You can reduce the number of germs on your skin by washing with CHG (chlorahexidine gluconate) soap before surgery.  CHG is an antiseptic cleaner which kills germs and bonds with the skin  to continue killing germs even after washing. Please DO NOT use if you have an allergy to CHG or antibacterial soaps.  If your skin becomes reddened/irritated stop using the CHG and inform your nurse when you arrive at Short Stay. Do not shave (including legs and underarms) for at least 48 hours prior to the first CHG shower.  You may shave your face/neck. Please follow these instructions carefully:  1.  Shower with CHG Soap the night before surgery and the  morning of Surgery.  2.  If you choose to wash your hair, wash your hair first as usual with your  normal  shampoo.  3.  After you shampoo, rinse your hair and body thoroughly to remove the  shampoo.                           4.  Use CHG as you would any other liquid soap.  You can apply chg directly  to the skin and wash                       Gently with a scrungie or clean washcloth.  5.  Apply the CHG Soap to your body ONLY FROM THE NECK DOWN.    Do not use on face/ open                           Wound or open sores. Avoid contact with eyes, ears mouth and genitals (private parts).                       Wash face,  Genitals (private parts) with your normal soap.             6.  Wash thoroughly, paying special attention to the area where your surgery  will be performed.  7.  Thoroughly rinse your body with warm water from the neck down.  8.  DO NOT shower/wash with your normal soap after using and rinsing off  the CHG Soap.                9.  Pat yourself dry with a clean towel.            10.  Wear clean pajamas.            11.  Place clean sheets on your bed the night of your first shower and do not  sleep with pets. Day of Surgery : Do not apply any lotions/deodorants the morning of surgery.  Please wear clean clothes to the hospital/surgery center.  FAILURE TO FOLLOW THESE INSTRUCTIONS MAY RESULT IN THE CANCELLATION OF YOUR SURGERY PATIENT SIGNATURE_________________________________  NURSE SIGNATURE__________________________________  ________________________________________________________________________

## 2021-04-30 ENCOUNTER — Encounter: Payer: Self-pay | Admitting: *Deleted

## 2021-04-30 ENCOUNTER — Other Ambulatory Visit: Payer: Self-pay

## 2021-04-30 ENCOUNTER — Encounter (HOSPITAL_COMMUNITY): Payer: Self-pay

## 2021-04-30 ENCOUNTER — Encounter (HOSPITAL_COMMUNITY)
Admission: RE | Admit: 2021-04-30 | Discharge: 2021-04-30 | Disposition: A | Discharge status: Home / Self Care | Payer: 59 | Source: Ambulatory Visit | Attending: Interventional Radiology | Admitting: Interventional Radiology

## 2021-04-30 DIAGNOSIS — E0800 Diabetes mellitus due to underlying condition with hyperosmolarity without nonketotic hyperglycemic-hyperosmolar coma (NKHHC): Secondary | ICD-10-CM | POA: Insufficient documentation

## 2021-04-30 DIAGNOSIS — E870 Hyperosmolality and hypernatremia: Secondary | ICD-10-CM | POA: Diagnosis not present

## 2021-04-30 DIAGNOSIS — I10 Essential (primary) hypertension: Secondary | ICD-10-CM | POA: Insufficient documentation

## 2021-04-30 DIAGNOSIS — Z01812 Encounter for preprocedural laboratory examination: Secondary | ICD-10-CM | POA: Diagnosis present

## 2021-04-30 HISTORY — DX: Chronic kidney disease, unspecified: N18.9

## 2021-04-30 HISTORY — DX: Angina pectoris, unspecified: I20.9

## 2021-04-30 LAB — BASIC METABOLIC PANEL
Anion gap: 9 (ref 5–15)
BUN: 25 mg/dL — ABNORMAL HIGH (ref 8–23)
CO2: 28 mmol/L (ref 22–32)
Calcium: 9.3 mg/dL (ref 8.9–10.3)
Chloride: 104 mmol/L (ref 98–111)
Creatinine, Ser: 1.42 mg/dL — ABNORMAL HIGH (ref 0.61–1.24)
GFR, Estimated: 56 mL/min — ABNORMAL LOW (ref >60)
Glucose, Bld: 97 mg/dL (ref 70–99)
Potassium: 3.8 mmol/L (ref 3.5–5.1)
Sodium: 141 mmol/L (ref 135–145)

## 2021-04-30 LAB — CBC
HCT: 36.4 % — ABNORMAL LOW (ref 39.0–52.0)
Hemoglobin: 12.4 g/dL — ABNORMAL LOW (ref 13.0–17.0)
MCH: 33 pg (ref 26.0–34.0)
MCHC: 34.1 g/dL (ref 30.0–36.0)
MCV: 96.8 fL (ref 80.0–100.0)
Platelets: 221 10*3/uL (ref 150–400)
RBC: 3.76 MIL/uL — ABNORMAL LOW (ref 4.22–5.81)
RDW: 13.3 % (ref 11.5–15.5)
WBC: 7.2 10*3/uL (ref 4.0–10.5)
nRBC: 0 % (ref 0.0–0.2)

## 2021-04-30 LAB — HEMOGLOBIN A1C
Hgb A1c MFr Bld: 5.4 % (ref 4.8–5.6)
Mean Plasma Glucose: 108.28 mg/dL

## 2021-04-30 LAB — GLUCOSE, CAPILLARY: Glucose-Capillary: 97 mg/dL (ref 70–99)

## 2021-04-30 NOTE — Progress Notes (Signed)
COVID Vaccine Completed: Yes Date COVID Vaccine completed: 06/30/19 x 2 COVID vaccine manufacturer:   Levan Hurst    COVID Test: 05/13/21 @ 8:00 AM PCP - Dr. Cathlean Cower Cardiologist - Dr. Quay Burow  Chest x-ray - 02/07/21 EKG - 02/08/21 Stress Test -  ECHO - 02/13/12 Cardiac Cath - 2010 Pacemaker/ICD device last checked:  Sleep Study - Yes CPAP - Yes  Fasting Blood Sugar  < 100's Checks Blood Sugar __2___ times a day  Blood Thinner Instructions:  Aspirin Instructions: Last Dose:  Anesthesia review: Hx: DIA,HTN,OSA(CPAP),Chest pain,Smoker.  Patient denies shortness of breath, fever, cough and chest pain at PAT appointment   Patient verbalized understanding of instructions that were given to them at the PAT appointment. Patient was also instructed that they will need to review over the PAT instructions again at home before surgery.

## 2021-05-02 ENCOUNTER — Other Ambulatory Visit: Payer: Self-pay | Admitting: Internal Medicine

## 2021-05-02 NOTE — Telephone Encounter (Signed)
Please refill as per office routine med refill policy (all routine meds to be refilled for 3 mo or monthly (per pt preference) up to one year from last visit, then month to month grace period for 3 mo, then further med refills will have to be denied) ? ?

## 2021-05-03 NOTE — Anesthesia Preprocedure Evaluation (Addendum)
Anesthesia Evaluation  Patient identified by MRN, date of birth, ID band Patient awake    Reviewed: Allergy & Precautions, H&P , NPO status , Patient's Chart, lab work & pertinent test results  Airway Mallampati: III  TM Distance: >3 FB Neck ROM: Full    Dental no notable dental hx. (+) Teeth Intact, Dental Advisory Given   Pulmonary sleep apnea , Current Smoker and Patient abstained from smoking.,    Pulmonary exam normal breath sounds clear to auscultation       Cardiovascular hypertension, Pt. on medications  Rhythm:Regular Rate:Normal     Neuro/Psych negative neurological ROS  negative psych ROS   GI/Hepatic Neg liver ROS, GERD  Medicated,  Endo/Other  diabetes, Type 2, Oral Hypoglycemic Agents  Renal/GU Renal InsufficiencyRenal disease  negative genitourinary   Musculoskeletal  (+) Arthritis , Osteoarthritis,    Abdominal   Peds  Hematology negative hematology ROS (+)   Anesthesia Other Findings   Reproductive/Obstetrics negative OB ROS                           Anesthesia Physical Anesthesia Plan  ASA: 3  Anesthesia Plan: General   Post-op Pain Management: Tylenol PO (pre-op)   Induction: Intravenous  PONV Risk Score and Plan: 2 and Ondansetron, Dexamethasone and Midazolam  Airway Management Planned: Oral ETT  Additional Equipment:   Intra-op Plan:   Post-operative Plan: Extubation in OR  Informed Consent: I have reviewed the patients History and Physical, chart, labs and discussed the procedure including the risks, benefits and alternatives for the proposed anesthesia with the patient or authorized representative who has indicated his/her understanding and acceptance.     Dental advisory given  Plan Discussed with: CRNA  Anesthesia Plan Comments: (See APP note by Durel Salts, FNP )       Anesthesia Quick Evaluation

## 2021-05-03 NOTE — Progress Notes (Signed)
Anesthesia Chart Review:   Case: [id number] Date/Time: 05/15/21 1145   Procedure: MICROWAVE ABLATION   Anesthesia type: General   Pre-op diagnosis: LEFT RENAL MASS   [facility]: [facility]   Surgeons: Michaelle Birks, MD       DISCUSSION: Pt is 61 years old with hx HTN, DM, OSA, CKD, multinodular goiter   VS: BP 115/74    Pulse 61    Temp 37.1 C (Oral)    Ht 6' 1"  (1.854 m)    Wt 78.5 kg    SpO2 99%    BMI 22.82 kg/m   PROVIDERS: - PCP is Biagio Borg, MD - Cardiologist is Quay Burow, MD. Last office visit 09/10/20 with Almyra Deforest, PA   LABS: Labs reviewed: Acceptable for surgery. (all labs ordered are listed, but only abnormal results are displayed)  Labs Reviewed  CBC - Abnormal; Notable for the following components:      Result Value   RBC 3.76 (*)    Hemoglobin 12.4 (*)    HCT 36.4 (*)    All other components within normal limits  BASIC METABOLIC PANEL - Abnormal; Notable for the following components:   BUN 25 (*)    Creatinine, Ser 1.42 (*)    GFR, Estimated 56 (*)    All other components within normal limits  HEMOGLOBIN A1C  GLUCOSE, CAPILLARY     IMAGES: CXR 02/07/21: No radiographic evidence of acute cardiopulmonary disease  US renal 02/07/21:  - Mildly enlarged echogenic kidneys that suggest acute nephritis or renal swelling. No evidence of obstruction. - 2.1 x 1.9 x 1.4 cm mixed cystic and solid lesion of the right kidney that could represent a tumor. Evaluation with either contrast-enhanced CT or renal MRI is suggested. In the setting of acute kidney injury, MRI would be favored   US Thyroid 01/09/20:  1. Slight interval decreased size (measuring up to 5.5 cm, previously 7.0 cm) of the previously visualized right inferior solid thyroid nodule (labeled 1) which remains within criteria for tissue sampling if clinically indicated. 2. Continued atrophy of the left lobe, essentially imperceptible sonographically.    EKG 02/07/21: NSR. Minimal voltage  criteria for LVH, may be normal variant ( Sokolow-Lyon ). Nonspecific T wave abnormality   CV: Cardiac cath 10/23/08: Normal coronaries  Past Medical History:  Diagnosis Date   Anginal pain ([place])    Chest pain 02/13/2012   echo - EF >55%;mod concentric L ventricular hypertrophy; atrial septum aneurysmal; patent foramen ovale suspected on color flow, LA mil/mod dilation; RV systolic pressure [id number]   Chest pain 06/09/2007   echo - EF 45-50%; interarterial shunt; mild valvular regurgitation;    Chest pain 06/09/2007   Myoview - EF 53%; normal perfusion all regions;EKG negative for ischemia    Chronic kidney disease    Diabetes mellitus    type II   DIABETES MELLITUS, TYPE II 08/29/2009   GERD (gastroesophageal reflux disease) 08/12/2010   Hemorrhoids    History of anal fissures    Hyperlipidemia    HYPERLIPIDEMIA 08/29/2009   Hypertension    HYPERTENSION 01/14/2008   Lightheadedness 05/10/2002   30d monitor unremarkable   LVH (left ventricular hypertrophy)    with systolic dysfunction by echo 10/03, EF 45-50% - Dr. Melvern Banker   Multinodular goiter 12/06/2015   Nonspecific ST-T wave electrocardiographic changes 08/11/2006   Myoview - EF 49%; normal perfusion all regions, EKG negative for ischemia   OSA (obstructive sleep apnea)    Preventative health care 08/11/2010  Sleep apnea 08/13/2008   AHI 1.2; sleep study 10/2007 - AHI 9.47at 15cm H2O and 31.3 at 16cm H2O   SLEEP APNEA, OBSTRUCTIVE 01/14/2008    Past Surgical History:  Procedure Laterality Date   ANAL SPHINCTEROTOMY  08/20/11   CARDIAC CATHETERIZATION  10/23/2008   normal coronary arteries and LV function   COLONOSCOPY     IR RADIOLOGIST EVAL & MGMT  04/12/2021   POLYPECTOMY      MEDICATIONS:  amLODipine-benazepril (LOTREL) 10-40 MG capsule   Blood Glucose Monitoring Suppl (ONE TOUCH ULTRA 2) w/Device KIT   hydrALAZINE (APRESOLINE) 50 MG tablet   hydrochlorothiazide (HYDRODIURIL) 25 MG tablet   JANUVIA 100 MG tablet    Lancets MISC   metFORMIN (GLUCOPHAGE-XR) 500 MG 24 hr tablet   nebivolol (BYSTOLIC) 10 MG tablet   omeprazole (PRILOSEC) 40 MG capsule   potassium chloride (KLOR-CON M10) 10 MEQ tablet   pravastatin (PRAVACHOL) 80 MG tablet   No current facility-administered medications for this encounter.    If no changes, I anticipate pt can proceed with surgery as scheduled.   Willeen Cass, PhD, FNP-BC [facility]/Anesthesiology Phone: [phone] 05/03/2021 12:02 PM

## 2021-05-13 ENCOUNTER — Encounter (HOSPITAL_COMMUNITY)
Admission: RE | Admit: 2021-05-13 | Discharge: 2021-05-13 | Disposition: A | Discharge status: Home / Self Care | Payer: 59 | Source: Ambulatory Visit | Attending: Interventional Radiology | Admitting: Interventional Radiology

## 2021-05-13 ENCOUNTER — Other Ambulatory Visit: Payer: Self-pay | Admitting: Radiology

## 2021-05-13 ENCOUNTER — Other Ambulatory Visit: Payer: Self-pay

## 2021-05-13 DIAGNOSIS — Z20822 Contact with and (suspected) exposure to covid-19: Secondary | ICD-10-CM | POA: Insufficient documentation

## 2021-05-13 DIAGNOSIS — Z01812 Encounter for preprocedural laboratory examination: Secondary | ICD-10-CM | POA: Diagnosis present

## 2021-05-13 LAB — SARS CORONAVIRUS 2 (TAT 6-24 HRS): SARS Coronavirus 2: NEGATIVE

## 2021-05-15 ENCOUNTER — Encounter (HOSPITAL_COMMUNITY)
Admission: RE | Disposition: A | Discharge status: Home / Self Care | Payer: Self-pay | Source: Home / Self Care | Attending: Interventional Radiology

## 2021-05-15 ENCOUNTER — Encounter (HOSPITAL_COMMUNITY): Payer: Self-pay | Admitting: Anesthesiology

## 2021-05-15 ENCOUNTER — Ambulatory Visit (HOSPITAL_BASED_OUTPATIENT_CLINIC_OR_DEPARTMENT_OTHER): Payer: 59 | Admitting: Certified Registered Nurse Anesthetist

## 2021-05-15 ENCOUNTER — Other Ambulatory Visit: Payer: Self-pay

## 2021-05-15 ENCOUNTER — Encounter (HOSPITAL_COMMUNITY): Payer: Self-pay

## 2021-05-15 ENCOUNTER — Encounter (HOSPITAL_COMMUNITY): Payer: Self-pay | Admitting: Interventional Radiology

## 2021-05-15 ENCOUNTER — Observation Stay (HOSPITAL_COMMUNITY)
Admission: RE | Admit: 2021-05-15 | Discharge: 2021-05-16 | Disposition: A | Discharge status: Home / Self Care | Payer: 59 | Attending: Interventional Radiology | Admitting: Interventional Radiology

## 2021-05-15 ENCOUNTER — Ambulatory Visit (HOSPITAL_COMMUNITY): Payer: 59 | Admitting: Physician Assistant

## 2021-05-15 ENCOUNTER — Ambulatory Visit (HOSPITAL_COMMUNITY)
Admission: RE | Admit: 2021-05-15 | Discharge: 2021-05-15 | Disposition: A | Discharge status: Home / Self Care | Payer: 59 | Source: Ambulatory Visit | Attending: Interventional Radiology | Admitting: Interventional Radiology

## 2021-05-15 DIAGNOSIS — E1122 Type 2 diabetes mellitus with diabetic chronic kidney disease: Secondary | ICD-10-CM | POA: Diagnosis not present

## 2021-05-15 DIAGNOSIS — I252 Old myocardial infarction: Secondary | ICD-10-CM | POA: Diagnosis not present

## 2021-05-15 DIAGNOSIS — I129 Hypertensive chronic kidney disease with stage 1 through stage 4 chronic kidney disease, or unspecified chronic kidney disease: Secondary | ICD-10-CM | POA: Diagnosis not present

## 2021-05-15 DIAGNOSIS — G4730 Sleep apnea, unspecified: Secondary | ICD-10-CM

## 2021-05-15 DIAGNOSIS — Z79899 Other long term (current) drug therapy: Secondary | ICD-10-CM | POA: Diagnosis not present

## 2021-05-15 DIAGNOSIS — F17210 Nicotine dependence, cigarettes, uncomplicated: Secondary | ICD-10-CM | POA: Diagnosis not present

## 2021-05-15 DIAGNOSIS — K219 Gastro-esophageal reflux disease without esophagitis: Secondary | ICD-10-CM | POA: Insufficient documentation

## 2021-05-15 DIAGNOSIS — E0800 Diabetes mellitus due to underlying condition with hyperosmolarity without nonketotic hyperglycemic-hyperosmolar coma (NKHHC): Secondary | ICD-10-CM

## 2021-05-15 DIAGNOSIS — C652 Malignant neoplasm of left renal pelvis: Principal | ICD-10-CM | POA: Insufficient documentation

## 2021-05-15 DIAGNOSIS — Z7984 Long term (current) use of oral hypoglycemic drugs: Secondary | ICD-10-CM | POA: Diagnosis not present

## 2021-05-15 DIAGNOSIS — I10 Essential (primary) hypertension: Secondary | ICD-10-CM

## 2021-05-15 DIAGNOSIS — N2889 Other specified disorders of kidney and ureter: Secondary | ICD-10-CM | POA: Insufficient documentation

## 2021-05-15 DIAGNOSIS — D490 Neoplasm of unspecified behavior of digestive system: Secondary | ICD-10-CM | POA: Diagnosis present

## 2021-05-15 DIAGNOSIS — E785 Hyperlipidemia, unspecified: Secondary | ICD-10-CM | POA: Insufficient documentation

## 2021-05-15 DIAGNOSIS — N189 Chronic kidney disease, unspecified: Secondary | ICD-10-CM | POA: Insufficient documentation

## 2021-05-15 DIAGNOSIS — G4733 Obstructive sleep apnea (adult) (pediatric): Secondary | ICD-10-CM | POA: Diagnosis not present

## 2021-05-15 DIAGNOSIS — Z01818 Encounter for other preprocedural examination: Secondary | ICD-10-CM

## 2021-05-15 HISTORY — PX: RADIOLOGY WITH ANESTHESIA: SHX6223

## 2021-05-15 LAB — BASIC METABOLIC PANEL
Anion gap: 8 (ref 5–15)
BUN: 19 mg/dL (ref 8–23)
CO2: 30 mmol/L (ref 22–32)
Calcium: 9.7 mg/dL (ref 8.9–10.3)
Chloride: 100 mmol/L (ref 98–111)
Creatinine, Ser: 1.26 mg/dL — ABNORMAL HIGH (ref 0.61–1.24)
GFR, Estimated: >60 mL/min (ref >60)
Glucose, Bld: 104 mg/dL — ABNORMAL HIGH (ref 70–99)
Potassium: 3.1 mmol/L — ABNORMAL LOW (ref 3.5–5.1)
Sodium: 138 mmol/L (ref 135–145)

## 2021-05-15 LAB — PROTIME-INR
INR: 1 (ref 0.8–1.2)
Prothrombin Time: 13.2 seconds (ref 11.4–15.2)

## 2021-05-15 LAB — GLUCOSE, CAPILLARY
Glucose-Capillary: 102 mg/dL — ABNORMAL HIGH (ref 70–99)
Glucose-Capillary: 144 mg/dL — ABNORMAL HIGH (ref 70–99)
Glucose-Capillary: 287 mg/dL — ABNORMAL HIGH (ref 70–99)

## 2021-05-15 IMAGING — CT CT GUIDANCE TISSUE ABLATION
3 of 8 series · 11 of 30 positions shown, 13 images · non-contrast
Comparison: none

INDICATION: 1.1 cm, enhancing posterior LEFT upper pole renal mass with imaging
characteristics consistent with papillary RCC.

[Series 2: i-spiral 2.0 bf37 · axial · 0.98mm/px · z∈[+1358,+1452]mm · 5 of 79 slices shown, 7 images (1 of 2)]
[im 16/79  mediastinal]
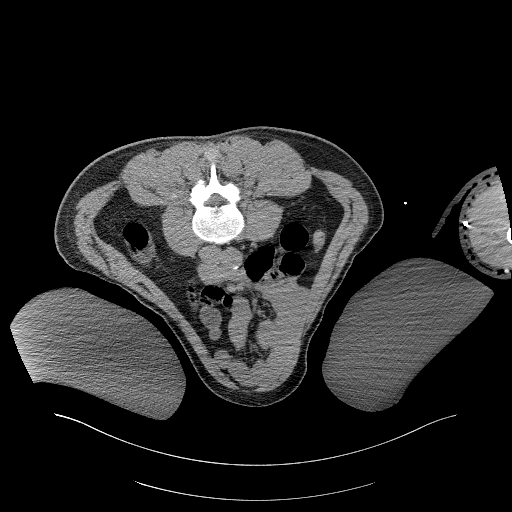
[im 16/79  lung]
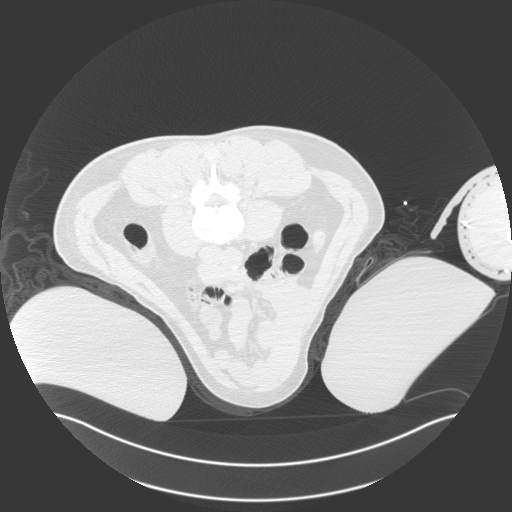
[im 32/79  lung]
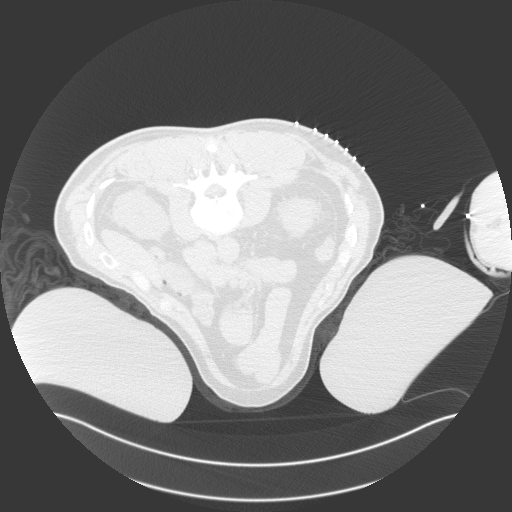
[im 40/79  lung]
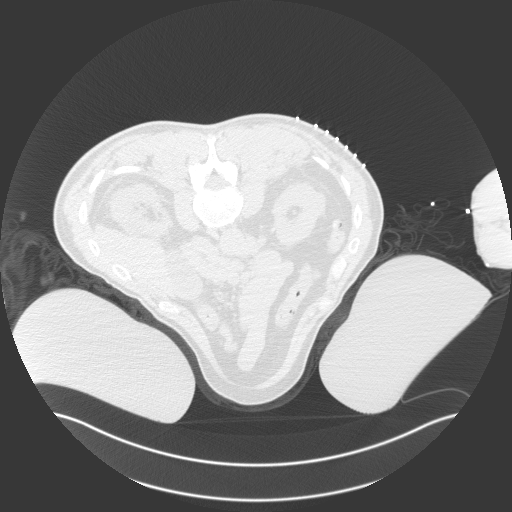
[im 47/79  lung]
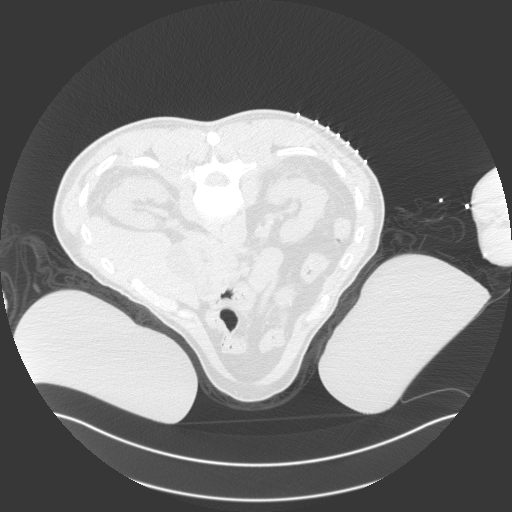
[im 63/79  mediastinal]
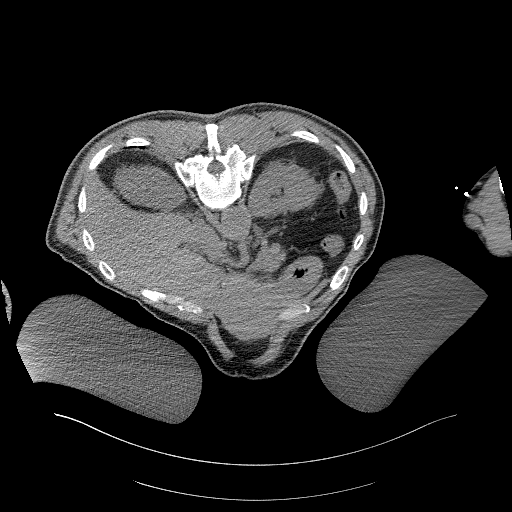
[im 63/79  lung]
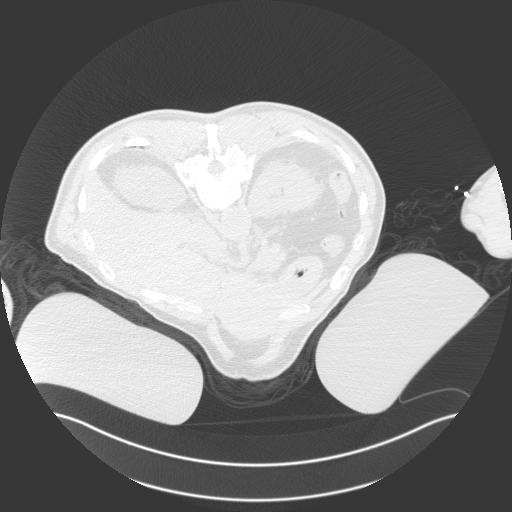

[Series 3: i-sequence 4.8 bf37 · axial · 0.98mm/px · z∈[+1449,+1456]mm · 3 of 63 slices shown]
[im 16/63  lung]
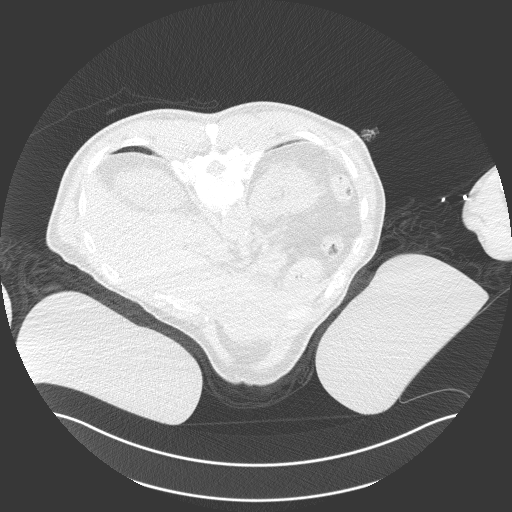
[im 32/63  lung]
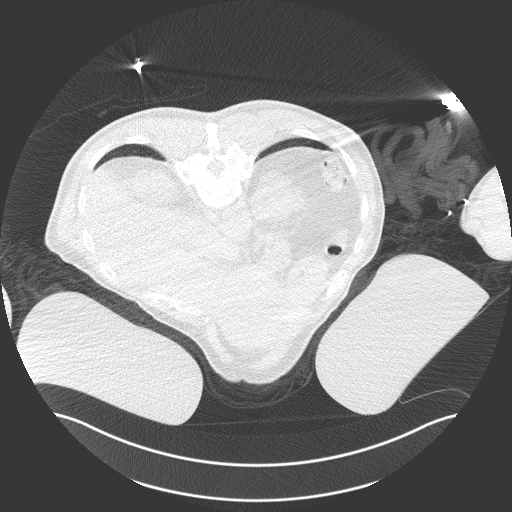
[im 47/63  lung]
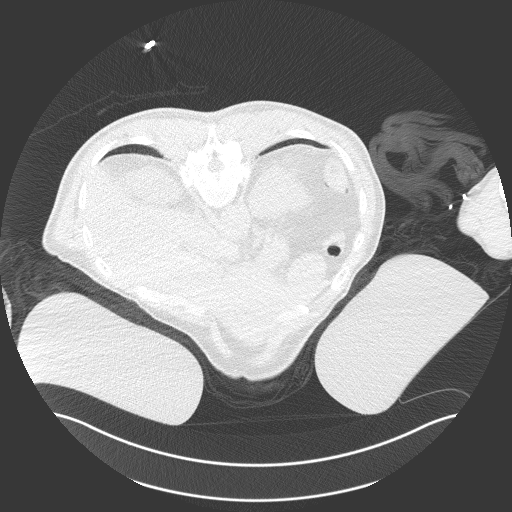

[Series 11: i-spiral 2.0 bf37 · axial · 0.98mm/px · z∈[+1406,+1466]mm · 3 of 54 slices shown (2 of 2)]
[im 6/54  lung]
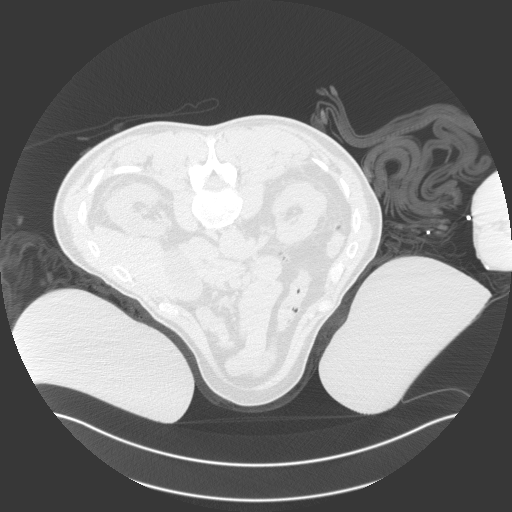
[im 18/54  lung]
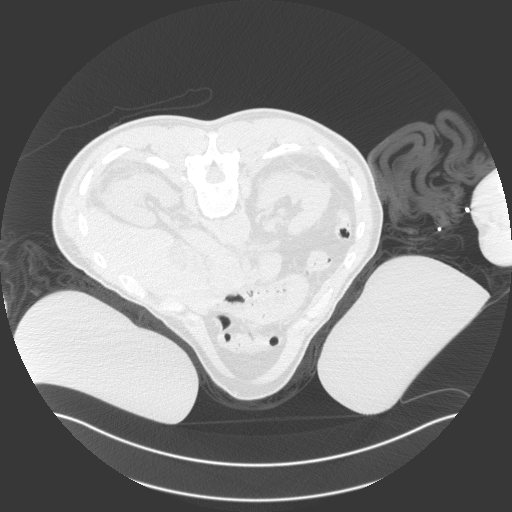
[im 36/54  lung]
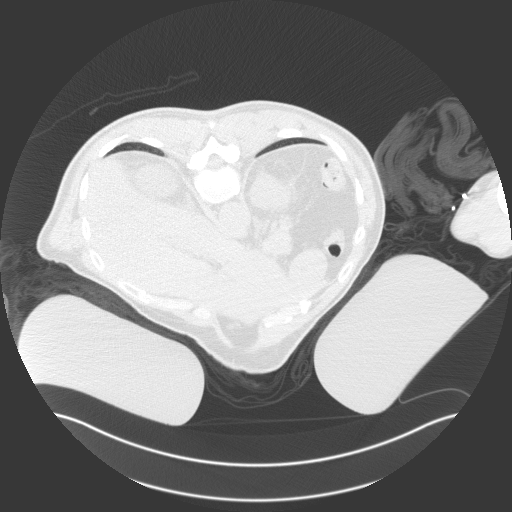

[11 of 30 positions shown; findings below may reference images not displayed]

EXAM:
CT-GUIDED MICROWAVE ABLATION OF LEFT RENAL MASS

ANESTHESIA/SEDATION:
General

MEDICATIONS:
Ancef 2 gm IV. The antibiotic was administered in an appropriate
time interval prior to needle puncture of the skin.

CONTRAST:  None.

PROCEDURE:
The procedure, risks, benefits, and alternatives were explained to
the patient. Questions regarding the procedure were encouraged and
answered. The patient understands and consents to the procedure.

The patient was placed under general anesthesia. Initial unenhanced
CT was performed in a prone position to localize posterior LEFT
upper pole renal mass. The procedure was planned.

The patient was prepped with Betadine in a sterile fashion, and a
sterile drape was applied covering the operative field. A sterile
gown and sterile gloves were used for the procedure.

A 22 gauge spinal needle was advanced in expected trajectory of the
microwave ablation probe for procedural planning. Once the
appropriate trajectory was established, a 15 cm length MANUEL TECU
percutaneous microwave ablation probe was advanced into the
hypoattenuating renal lesion under intermittent CT guidance. Real
time reformatted images were generated during the procedure to
establish appropriate depth of the ablation probe and to insure
adequate treatment coverage. Once appropriate positioning was
confirmed, a 5-minute ablation was performed.

Tract ablation was performed as the microwave ablation probe was
removed. Hemostasis was achieved with manual compression. A limited
postprocedural scan was performed. Addressing was placed.

COMPLICATIONS:
None immediate.
FINDINGS: 1. Mild contour abnormality was used to target the posterior LEFT
upper pole renal mass.
2. Under intermittent CT guidance, a percutaneous microwave ablation
probe was inserted into the mass.
3. Real-time reformatted images generated during the procedure to
ensure adequate treatment coverage.
4. Post ablation imaging demonstrates an adequate ablation zone and
was negative for obvious complication, specifically, no pneumothorax
or significant hemorrhage about the ablation site.
IMPRESSION: Successful CT-guided percutaneous microwave thermal ablation of
cm posterior LEFT upper pole renal mass, as above.

## 2021-05-15 SURGERY — RADIOLOGY WITH ANESTHESIA
Anesthesia: General

## 2021-05-15 MED ORDER — PHENYLEPHRINE HCL-NACL 20-0.9 MG/250ML-% IV SOLN
INTRAVENOUS | Status: DC | PRN
  Administered 2021-05-15: 50 ug/min via INTRAVENOUS

## 2021-05-15 MED ORDER — INSULIN ASPART 100 UNIT/ML IJ SOLN
0.0000 [IU] | Freq: Three times a day (TID) | INTRAMUSCULAR | Status: DC
  Administered 2021-05-16: 3 [IU] via SUBCUTANEOUS

## 2021-05-15 MED ORDER — PROPOFOL 10 MG/ML IV BOLUS
INTRAVENOUS | Status: DC | PRN
  Administered 2021-05-15: 200 mg via INTRAVENOUS

## 2021-05-15 MED ORDER — CEFAZOLIN SODIUM-DEXTROSE 2-4 GM/100ML-% IV SOLN
INTRAVENOUS | Status: AC
  Filled 2021-05-15: qty 100

## 2021-05-15 MED ORDER — FENTANYL CITRATE (PF) 250 MCG/5ML IJ SOLN
INTRAMUSCULAR | Status: AC
  Filled 2021-05-15: qty 5

## 2021-05-15 MED ORDER — ONDANSETRON HCL 4 MG/2ML IJ SOLN: 8.0000 mg | Freq: Once | INTRAMUSCULAR | Status: DC

## 2021-05-15 MED ORDER — ONDANSETRON HCL 4 MG/2ML IJ SOLN
INTRAMUSCULAR | Status: DC | PRN
  Administered 2021-05-15: 4 mg via INTRAVENOUS

## 2021-05-15 MED ORDER — ACETAMINOPHEN 500 MG PO TABS: 500.0000 mg | ORAL_TABLET | ORAL | Status: DC | PRN

## 2021-05-15 MED ORDER — SENNOSIDES-DOCUSATE SODIUM 8.6-50 MG PO TABS
1.0000 | ORAL_TABLET | Freq: Every day | ORAL | Status: DC | PRN
  Filled 2021-05-15: qty 1

## 2021-05-15 MED ORDER — DIPHENHYDRAMINE HCL 25 MG PO CAPS: 25.0000 mg | ORAL_CAPSULE | Freq: Four times a day (QID) | ORAL | Status: DC | PRN

## 2021-05-15 MED ORDER — ACETAMINOPHEN 500 MG PO TABS
ORAL_TABLET | ORAL | Status: AC
  Filled 2021-05-15: qty 2

## 2021-05-15 MED ORDER — FENTANYL CITRATE (PF) 100 MCG/2ML IJ SOLN
INTRAMUSCULAR | Status: DC | PRN
Start: 2021-05-15 — End: 2021-05-15
  Administered 2021-05-15: 100 ug via INTRAVENOUS
  Administered 2021-05-15: 50 ug via INTRAVENOUS

## 2021-05-15 MED ORDER — PANTOPRAZOLE SODIUM 40 MG PO TBEC
40.0000 mg | DELAYED_RELEASE_TABLET | Freq: Every day | ORAL | Status: DC
  Administered 2021-05-16: 40 mg via ORAL
  Filled 2021-05-15: qty 1

## 2021-05-15 MED ORDER — ACETAMINOPHEN 500 MG PO TABS
1000.0000 mg | ORAL_TABLET | Freq: Once | ORAL | Status: DC
Start: 2021-05-15 — End: 2021-05-15

## 2021-05-15 MED ORDER — SUGAMMADEX SODIUM 200 MG/2ML IV SOLN
INTRAVENOUS | Status: DC | PRN
  Administered 2021-05-15: 400 mg via INTRAVENOUS

## 2021-05-15 MED ORDER — DOCUSATE SODIUM 100 MG PO CAPS
100.0000 mg | ORAL_CAPSULE | Freq: Two times a day (BID) | ORAL | Status: DC
  Administered 2021-05-15 – 2021-05-16 (×2): 100 mg via ORAL
  Filled 2021-05-15 (×2): qty 1

## 2021-05-15 MED ORDER — BENAZEPRIL HCL 10 MG PO TABS
40.0000 mg | ORAL_TABLET | Freq: Every day | ORAL | Status: DC
  Administered 2021-05-16: 40 mg via ORAL
  Filled 2021-05-15: qty 4

## 2021-05-15 MED ORDER — INSULIN ASPART 100 UNIT/ML IJ SOLN
0.0000 [IU] | Freq: Every day | INTRAMUSCULAR | Status: DC
  Administered 2021-05-15: 3 [IU] via SUBCUTANEOUS

## 2021-05-15 MED ORDER — LACTATED RINGERS IV SOLN: INTRAVENOUS | Status: DC

## 2021-05-15 MED ORDER — ROCURONIUM BROMIDE 10 MG/ML (PF) SYRINGE
PREFILLED_SYRINGE | INTRAVENOUS | Status: DC | PRN
  Administered 2021-05-15: 10 mg via INTRAVENOUS
  Administered 2021-05-15: 60 mg via INTRAVENOUS
  Administered 2021-05-15: 20 mg via INTRAVENOUS

## 2021-05-15 MED ORDER — HYDRALAZINE HCL 50 MG PO TABS
50.0000 mg | ORAL_TABLET | Freq: Three times a day (TID) | ORAL | Status: DC
  Administered 2021-05-15 – 2021-05-16 (×2): 50 mg via ORAL
  Filled 2021-05-15 (×2): qty 1

## 2021-05-15 MED ORDER — NEBIVOLOL HCL 10 MG PO TABS
20.0000 mg | ORAL_TABLET | Freq: Every day | ORAL | Status: DC
Start: 2021-05-16 — End: 2021-05-16
  Administered 2021-05-16: 20 mg via ORAL
  Filled 2021-05-15: qty 2

## 2021-05-15 MED ORDER — TRAMADOL HCL 50 MG PO TABS: 100.0000 mg | ORAL_TABLET | Freq: Four times a day (QID) | ORAL | Status: DC | PRN

## 2021-05-15 MED ORDER — ACETAMINOPHEN 500 MG PO TABS
1000.0000 mg | ORAL_TABLET | Freq: Once | ORAL | Status: AC
  Administered 2021-05-15: 1000 mg via ORAL

## 2021-05-15 MED ORDER — PRAVASTATIN SODIUM 20 MG PO TABS
80.0000 mg | ORAL_TABLET | Freq: Every day | ORAL | Status: DC
Start: 2021-05-15 — End: 2021-05-16
  Administered 2021-05-15 – 2021-05-16 (×2): 80 mg via ORAL
  Filled 2021-05-15: qty 4

## 2021-05-15 MED ORDER — ONDANSETRON HCL 4 MG/2ML IJ SOLN: 4.0000 mg | Freq: Four times a day (QID) | INTRAMUSCULAR | Status: DC | PRN

## 2021-05-15 MED ORDER — DEXAMETHASONE SODIUM PHOSPHATE 10 MG/ML IJ SOLN
INTRAMUSCULAR | Status: DC | PRN
  Administered 2021-05-15: 8 mg via INTRAVENOUS

## 2021-05-15 MED ORDER — DEXAMETHASONE SODIUM PHOSPHATE 10 MG/ML IJ SOLN: 8.0000 mg | Freq: Once | INTRAMUSCULAR | Status: DC

## 2021-05-15 MED ORDER — POTASSIUM CHLORIDE CRYS ER 10 MEQ PO TBCR
10.0000 meq | EXTENDED_RELEASE_TABLET | Freq: Every day | ORAL | Status: DC
  Administered 2021-05-15 – 2021-05-16 (×2): 10 meq via ORAL
  Filled 2021-05-15 (×2): qty 1

## 2021-05-15 MED ORDER — CHLORHEXIDINE GLUCONATE 0.12 % MT SOLN
15.0000 mL | Freq: Once | OROMUCOSAL | Status: AC
  Administered 2021-05-15: 15 mL via OROMUCOSAL

## 2021-05-15 MED ORDER — EPHEDRINE SULFATE-NACL 50-0.9 MG/10ML-% IV SOSY
PREFILLED_SYRINGE | INTRAVENOUS | Status: DC | PRN
  Administered 2021-05-15 (×2): 5 mg via INTRAVENOUS

## 2021-05-15 MED ORDER — LIDOCAINE 2% (20 MG/ML) 5 ML SYRINGE
INTRAMUSCULAR | Status: DC | PRN
  Administered 2021-05-15: 60 mg via INTRAVENOUS

## 2021-05-15 MED ORDER — ORAL CARE MOUTH RINSE: 15.0000 mL | Freq: Once | OROMUCOSAL | Status: AC

## 2021-05-15 MED ORDER — CEFAZOLIN SODIUM-DEXTROSE 2-3 GM-%(50ML) IV SOLR
INTRAVENOUS | Status: DC | PRN
  Administered 2021-05-15: 2 g via INTRAVENOUS

## 2021-05-15 MED ORDER — HYDROCHLOROTHIAZIDE 25 MG PO TABS
25.0000 mg | ORAL_TABLET | Freq: Every day | ORAL | Status: DC
  Administered 2021-05-15 – 2021-05-16 (×2): 25 mg via ORAL
  Filled 2021-05-15 (×2): qty 1

## 2021-05-15 MED ORDER — FENTANYL CITRATE PF 50 MCG/ML IJ SOSY: 25.0000 ug | PREFILLED_SYRINGE | INTRAMUSCULAR | Status: DC | PRN

## 2021-05-15 MED ORDER — AMLODIPINE BESYLATE 10 MG PO TABS
10.0000 mg | ORAL_TABLET | Freq: Every day | ORAL | Status: DC
  Administered 2021-05-16: 10 mg via ORAL
  Filled 2021-05-15: qty 1

## 2021-05-15 NOTE — Sedation Documentation (Signed)
Anesthesia in to sedate and monitor. 

## 2021-05-15 NOTE — Anesthesia Postprocedure Evaluation (Signed)
Anesthesia Post Note  Patient: Manufacturing engineer  Procedure(s) Performed: MICROWAVE ABLATION     Patient location during evaluation: PACU Anesthesia Type: General Level of consciousness: awake and alert Pain management: pain level controlled Vital Signs Assessment: post-procedure vital signs reviewed and stable Respiratory status: spontaneous breathing, nonlabored ventilation, respiratory function stable and patient connected to nasal cannula oxygen Cardiovascular status: blood pressure returned to baseline and stable Postop Assessment: no apparent nausea or vomiting Anesthetic complications: no   No notable events documented.  Last Vitals:  Vitals:   05/15/21 1445 05/15/21 1500  BP: 120/82 124/78  Pulse: 62 63  Resp: 16 (!) 23  Temp:    SpO2: 96% 97%    Last Pain:  Vitals:   05/15/21 1500  TempSrc:   PainSc: 0-No pain                 Audry Pili

## 2021-05-15 NOTE — H&P (Signed)
Chief Complaint: Patient was seen in consultation today for left renal mass ablation with general anesthesia at the request of [place]  Referring Physician(s): Mugweru,Jon  Supervising Physician: Michaelle Birks  Patient Status: [facility] - Out-pt  History of Present Illness: [name] is a 61 y.o. male with PMH of chest pain, CKD, DM type II, GERD, HLD, HTN, OSA and recent diagnosis of left renal mass.  Patient was seen in consultation with Dr. Maryelizabeth Kaufmann 04/12/2021 to discuss minimally invasive  treatment options. Patient presents today for previously discussed left renal mass ablation.  Past Medical History:  Diagnosis Date   Anginal pain ([place])    Chest pain 02/13/2012   echo - EF >55%;mod concentric L ventricular hypertrophy; atrial septum aneurysmal; patent foramen ovale suspected on color flow, LA mil/mod dilation; RV systolic pressure [id number]   Chest pain 06/09/2007   echo - EF 45-50%; interarterial shunt; mild valvular regurgitation;    Chest pain 06/09/2007   Myoview - EF 53%; normal perfusion all regions;EKG negative for ischemia    Chronic kidney disease    Diabetes mellitus    type II   DIABETES MELLITUS, TYPE II 08/29/2009   GERD (gastroesophageal reflux disease) 08/12/2010   Hemorrhoids    History of anal fissures    Hyperlipidemia    HYPERLIPIDEMIA 08/29/2009   Hypertension    HYPERTENSION 01/14/2008   Lightheadedness 05/10/2002   30d monitor unremarkable   LVH (left ventricular hypertrophy)    with systolic dysfunction by echo 10/03, EF 45-50% - Dr. Melvern Banker   Multinodular goiter 12/06/2015   Nonspecific ST-T wave electrocardiographic changes 08/11/2006   Myoview - EF 49%; normal perfusion all regions, EKG negative for ischemia   OSA (obstructive sleep apnea)    Preventative health care 08/11/2010   Sleep apnea 08/13/2008   AHI 1.2; sleep study 10/2007 - AHI 9.47at 15cm H2O and 31.3 at [place], OBSTRUCTIVE 01/14/2008    Past Surgical  History:  Procedure Laterality Date   ANAL SPHINCTEROTOMY  08/20/11   CARDIAC CATHETERIZATION  10/23/2008   normal coronary arteries and LV function   COLONOSCOPY     IR RADIOLOGIST EVAL & MGMT  04/12/2021   POLYPECTOMY      Allergies: Sulfa antibiotics and Vicodin [hydrocodone-acetaminophen]  Medications: Prior to Admission medications   Medication Sig Start Date End Date Taking? Authorizing Provider  amLODipine-benazepril (LOTREL) 10-40 MG capsule TAKE 1 CAPSULE BY MOUTH EVERY DAY 05/03/21   Biagio Borg, MD  Blood Glucose Monitoring Suppl (ONE TOUCH ULTRA 2) w/Device KIT Use as directed  E11.9 12/11/16   Biagio Borg, MD  hydrALAZINE (APRESOLINE) 50 MG tablet Take 1 tablet (50 mg total) by mouth 3 (three) times daily. 09/11/20   Biagio Borg, MD  hydrochlorothiazide (HYDRODIURIL) 25 MG tablet Take 1 tablet (25 mg total) by mouth daily. 02/13/21   Biagio Borg, MD  JANUVIA 100 MG tablet TAKE 1 TABLET BY MOUTH EVERY DAY Patient taking differently: Take 100 mg by mouth daily. 09/11/20   Biagio Borg, MD  Lancets MISC Use as directed E11.9 12/11/16   Biagio Borg, MD  metFORMIN (GLUCOPHAGE-XR) 500 MG 24 hr tablet TAKE 2 TABLETS BY MOUTH EVERY DAY WITH BREAKFAST Patient taking differently: Take 1,000 mg by mouth daily with breakfast. 12/24/20   Biagio Borg, MD  nebivolol (BYSTOLIC) 10 MG tablet TAKE 2 TABLETS (20 MG TOTAL) BY MOUTH DAILY. Patient taking differently: Take 20 mg by mouth daily. 09/21/20  Biagio Borg, MD  omeprazole (PRILOSEC) 40 MG capsule TAKE 1 CAPSULE BY MOUTH EVERY DAY Patient taking differently: Take 40 mg by mouth daily. 09/26/20   Biagio Borg, MD  potassium chloride (KLOR-CON M10) 10 MEQ tablet Take 1 tablet (10 mEq total) by mouth daily. 02/13/21   Biagio Borg, MD  pravastatin (PRAVACHOL) 80 MG tablet Take 1 tablet (80 mg total) by mouth daily. 09/11/20   Biagio Borg, MD     Family History  Problem Relation Age of Onset   Hypertension Mother    Heart  disease Father    Colon cancer Neg Hx    Thyroid disease Neg Hx     Social History   Socioeconomic History   Marital status: Single    Spouse name: Not on file   Number of children: 1   Years of education: Not on file   Highest education level: Not on file  Occupational History   Occupation: work as truck Education administrator: [employer]  Tobacco Use   Smoking status: Every Day    Packs/day: 0.50    Years: 30.00    Pack years: 15.00    Types: Cigarettes   Smokeless tobacco: Never  Vaping Use   Vaping Use: Never used  Substance and Sexual Activity   Alcohol use: Yes    Alcohol/week: 3.0 standard drinks    Types: 3 Standard drinks or equivalent per week    Comment: occaisionally   Drug use: No   Sexual activity: Not on file  Other Topics Concern   Not on file  Social History Narrative   Not on file   Social Determinants of Health   Financial Resource Strain: Not on file  Food Insecurity: Not on file  Transportation Needs: Not on file  Physical Activity: Not on file  Stress: Not on file  Social Connections: Not on file    Review of Systems: A 12 point ROS discussed and pertinent positives are indicated in the HPI above.  All other systems are negative.  Review of Systems  Constitutional:  Positive for chills. Negative for fever.  HENT:  Negative for nosebleeds.   Eyes:  Negative for visual disturbance.  Respiratory:  Negative for cough and shortness of breath.   Cardiovascular:  Negative for chest pain and leg swelling.  Gastrointestinal:  Negative for abdominal pain, blood in stool, nausea and vomiting.  Genitourinary:  Negative for decreased urine volume, frequency, hematuria and urgency.  Musculoskeletal:  Positive for back pain.  Neurological:  Negative for dizziness, light-headedness and headaches.   Vital Signs: There were no vitals taken for this visit.  Physical Exam Constitutional:      Appearance: Normal appearance. He is not ill-appearing.   HENT:     Head: Normocephalic and atraumatic.     Mouth/Throat:     Mouth: Mucous membranes are moist.     Pharynx: Oropharynx is clear.  Eyes:     Extraocular Movements: Extraocular movements intact.     Pupils: Pupils are equal, round, and reactive to light.  Cardiovascular:     Rate and Rhythm: Regular rhythm. Bradycardia present.     Pulses: Normal pulses.     Heart sounds: Normal heart sounds. No murmur heard.   No friction rub. No gallop.  Pulmonary:     Effort: Pulmonary effort is normal. No respiratory distress.     Breath sounds: Normal breath sounds. No stridor. No wheezing, rhonchi or rales.  Abdominal:  General: Bowel sounds are normal. There is no distension.     Tenderness: There is no abdominal tenderness. There is no right CVA tenderness, left CVA tenderness or guarding.  Musculoskeletal:     Right lower leg: No edema.     Left lower leg: No edema.  Skin:    General: Skin is warm and dry.  Neurological:     Mental Status: He is alert and oriented to person, place, and time.  Psychiatric:        Mood and Affect: Mood normal.        Behavior: Behavior normal.        Thought Content: Thought content normal.        Judgment: Judgment normal.    Imaging: No results found.  Labs:  CBC: Recent Labs    09/10/20 0856 02/07/21 0717 04/30/21 1330  WBC 6.9 5.8 7.2  HGB 13.8 12.7* 12.4*  HCT 40.5 36.3* 36.4*  PLT 258.0 241 221    COAGS: Recent Labs    05/15/21 1015  INR 1.0    BMP: Recent Labs    02/07/21 0717 02/07/21 1305 02/13/21 1624 04/30/21 1330 05/15/21 1015  NA 140  --  144 141 138  K 2.7* 3.5 3.2* 3.8 3.1*  CL 105  --  104 104 100  CO2 24  --  30 28 30   GLUCOSE 121*  --  91 97 104*  BUN 22  --  17 25* 19  CALCIUM 9.4  --  9.1 9.3 9.7  CREATININE 1.50*  --  1.38 1.42* 1.26*  GFRNONAA 53*  --   --  56* >60    LIVER FUNCTION TESTS: Recent Labs    09/10/20 0856 02/05/21 1624 02/07/21 0717  BILITOT 0.7 0.5 1.0  AST 14 17  18   ALT 12 11 11   ALKPHOS 70 59 57  PROT 7.2 7.3 6.6  ALBUMIN 4.5 4.7 3.9    TUMOR MARKERS: No results for input(s): AFPTM, CEA, CA199, CHROMGRNA in the last 8760 hours.  Assessment and Plan: History of chest pain, CKD, DM type II, GERD, HLD, HTN, OSA and recent diagnosis of left renal mass.  Patient was seen in consultation with Dr. Maryelizabeth Kaufmann 04/12/2021 to discuss minimally invasive  treatment options. Patient presents today for previously discussed left renal mass ablation.  Pt sitting upright on stretcher.  He is A&O, calm and pleasant. He is in no distress.  Pt states he is NPO per order.  He denies the use of blood thinning medications.  K+ is 3.1 today w/ hx of the same.   Risks and benefits discussed with the patient including, but not limited to bleeding, infection, renal failure, bile duct injury, failure to treat entire lesion, damage to adjacent structures, hematuria, urine leak, decrease in renal function or post procedural neuropathy.  All of the patient's questions were answered, patient is agreeable to proceed. Consent signed and in chart.   Thank you for this interesting consult.  I greatly enjoyed meeting [employer] and look forward to participating in their care.  A copy of this report was sent to the requesting provider on this date.  Electronically Signed: Tyson Alias, NP 05/15/2021, 11:45 AM   I spent a total of 20 minutes in face to face in clinical consultation, greater than 50% of which was counseling/coordinating care for left renal mass ablation with general anesthesia.

## 2021-05-15 NOTE — H&P (Deleted)
  The note originally documented on this encounter has been moved the the encounter in which it belongs.  

## 2021-05-15 NOTE — Transfer of Care (Signed)
Immediate Anesthesia Transfer of Care Note  Patient: [name]  Procedure(s) Performed: MICROWAVE ABLATION  Patient Location: PACU  Anesthesia Type:General  Level of Consciousness: drowsy  Airway & Oxygen Therapy: Patient Spontanous Breathing and Patient connected to face mask  Post-op Assessment: Report given to RN and Post -op Vital signs reviewed and stable  Post vital signs: Reviewed and stable  Last Vitals:  Vitals Value Taken Time  BP 113/80 05/15/21 1435  Temp    Pulse 61 05/15/21 1439  Resp 15 05/15/21 1439  SpO2 98 % 05/15/21 1439  Vitals shown include unvalidated device data.  Last Pain:  Vitals:   05/15/21 1005  TempSrc:   PainSc: 8       Patients Stated Pain Goal: 7 (82/80/03 4917)  Complications: No notable events documented.

## 2021-05-15 NOTE — Anesthesia Procedure Notes (Signed)
Procedure Name: Intubation Date/Time: 05/15/2021 12:19 PM Performed by: Rosaland Lao, CRNA Pre-anesthesia Checklist: Patient identified, Emergency Drugs available, Suction available and Patient being monitored Patient Re-evaluated:Patient Re-evaluated prior to induction Oxygen Delivery Method: Circle system utilized Preoxygenation: Pre-oxygenation with 100% oxygen Induction Type: IV induction Ventilation: Mask ventilation without difficulty Laryngoscope Size: Mac and 4 Grade View: Grade II Tube type: Oral Tube size: 7.5 mm Number of attempts: 1 Airway Equipment and Method: Stylet Placement Confirmation: ETT inserted through vocal cords under direct vision, positive ETCO2 and breath sounds checked- equal and bilateral Secured at: 23 cm Tube secured with: Tape Dental Injury: Teeth and Oropharynx as per pre-operative assessment

## 2021-05-15 NOTE — Progress Notes (Signed)
Patient refuses the use of nocturnal CPAP.

## 2021-05-15 NOTE — Procedures (Signed)
Vascular and Interventional Radiology Procedure Note  Patient: [name] DOB: 10/02/1960 Medical Record Number: [id number] Note Date/Time: 05/15/21 2:37 PM   Performing Physician: Michaelle Birks, MD Assistant(s): None  Diagnosis: L renal mass c/w a small RCC  Procedure:  LEFT RENAL MASS MICROWAVE ABLATION  Anesthesia: General Anesthesia Complications: None Estimated Blood Loss:  0 mL Specimens: Sent for None  Findings:  Successful CT-guided MWA of L renal mass. Hemostasis of the tract was achieved using  MWA cautery .  Plan: Bed rest for 2 hours.  See detailed procedure note with images in PACS. The patient tolerated the procedure well without incident or complication and was returned to Recovery in stable condition.    Michaelle Birks, MD [facility]   Pager. [place]

## 2021-05-16 ENCOUNTER — Encounter (HOSPITAL_COMMUNITY): Payer: Self-pay | Admitting: Interventional Radiology

## 2021-05-16 DIAGNOSIS — C652 Malignant neoplasm of left renal pelvis: Secondary | ICD-10-CM | POA: Diagnosis not present

## 2021-05-16 LAB — CBC WITH DIFFERENTIAL/PLATELET
Abs Immature Granulocytes: 0.04 10*3/uL (ref 0.00–0.07)
Basophils Absolute: 0 10*3/uL (ref 0.0–0.1)
Basophils Relative: 0 %
Eosinophils Absolute: 0 10*3/uL (ref 0.0–0.5)
Eosinophils Relative: 0 %
HCT: 37.6 % — ABNORMAL LOW (ref 39.0–52.0)
Hemoglobin: 13 g/dL (ref 13.0–17.0)
Immature Granulocytes: 0 %
Lymphocytes Relative: 8 %
Lymphs Abs: 0.8 10*3/uL (ref 0.7–4.0)
MCH: 33.2 pg (ref 26.0–34.0)
MCHC: 34.6 g/dL (ref 30.0–36.0)
MCV: 95.9 fL (ref 80.0–100.0)
Monocytes Absolute: 1.1 10*3/uL — ABNORMAL HIGH (ref 0.1–1.0)
Monocytes Relative: 11 %
Neutro Abs: 8.6 10*3/uL — ABNORMAL HIGH (ref 1.7–7.7)
Neutrophils Relative %: 81 %
Platelets: 220 10*3/uL (ref 150–400)
RBC: 3.92 MIL/uL — ABNORMAL LOW (ref 4.22–5.81)
RDW: 12.8 % (ref 11.5–15.5)
WBC: 10.6 10*3/uL — ABNORMAL HIGH (ref 4.0–10.5)
nRBC: 0 % (ref 0.0–0.2)

## 2021-05-16 LAB — BASIC METABOLIC PANEL
Anion gap: 8 (ref 5–15)
BUN: 29 mg/dL — ABNORMAL HIGH (ref 8–23)
CO2: 27 mmol/L (ref 22–32)
Calcium: 8.9 mg/dL (ref 8.9–10.3)
Chloride: 100 mmol/L (ref 98–111)
Creatinine, Ser: 1.84 mg/dL — ABNORMAL HIGH (ref 0.61–1.24)
GFR, Estimated: 41 mL/min — ABNORMAL LOW (ref >60)
Glucose, Bld: 198 mg/dL — ABNORMAL HIGH (ref 70–99)
Potassium: 3.3 mmol/L — ABNORMAL LOW (ref 3.5–5.1)
Sodium: 135 mmol/L (ref 135–145)

## 2021-05-16 LAB — GLUCOSE, CAPILLARY: Glucose-Capillary: 164 mg/dL — ABNORMAL HIGH (ref 70–99)

## 2021-05-16 NOTE — Discharge Summary (Signed)
Patient ID: [name] MRN: [id number] DOB/AGE: 08-29-1960 61 y.o.  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, status post CT-guided microwave ablation of left renal mass on 05/15/2021  Principal Problem:   Encounter for ablation of neoplasm of liver Active Problems:   Malignant neoplasm of left renal pelvis [facility])  Past Medical History:  Diagnosis Date   Anginal pain ([place])    Chest pain 02/13/2012   echo - EF >55%;mod concentric L ventricular hypertrophy; atrial septum aneurysmal; patent foramen ovale suspected on color flow, LA mil/mod dilation; RV systolic pressure 85IDPO   Chest pain 06/09/2007   echo - EF 45-50%; interarterial shunt; mild valvular regurgitation;    Chest pain 06/09/2007   Myoview - EF 53%; normal perfusion all regions;EKG negative for ischemia    Chronic kidney disease    Diabetes mellitus    type II   DIABETES MELLITUS, TYPE II 08/29/2009   GERD (gastroesophageal reflux disease) 08/12/2010   Hemorrhoids    History of anal fissures    Hyperlipidemia    HYPERLIPIDEMIA 08/29/2009   Hypertension    HYPERTENSION 01/14/2008   Lightheadedness 05/10/2002   30d monitor unremarkable   LVH (left ventricular hypertrophy)    with systolic dysfunction by echo 10/03, EF 45-50% - Dr. Melvern Banker   Multinodular goiter 12/06/2015   Nonspecific ST-T wave electrocardiographic changes 08/11/2006   Myoview - EF 49%; normal perfusion all regions, EKG negative for ischemia   OSA (obstructive sleep apnea)    Preventative health care 08/11/2010   Sleep apnea 08/13/2008   AHI 1.2; sleep study 10/2007 - AHI 9.47at 15cm H2O and 31.3 at [place], OBSTRUCTIVE 01/14/2008   Past Surgical History:  Procedure Laterality Date    ANAL SPHINCTEROTOMY  08/20/11   CARDIAC CATHETERIZATION  10/23/2008   normal coronary arteries and LV function   COLONOSCOPY     IR RADIOLOGIST EVAL & MGMT  04/12/2021   POLYPECTOMY     RADIOLOGY WITH ANESTHESIA N/A 05/15/2021   Procedure: MICROWAVE ABLATION;  Surgeon: Michaelle Birks, MD;  Location: [facility] ORS;  Service: Radiology;  Laterality: N/A;     Discharged Condition: good  Hospital Course: Mr. Kier is a 61 year old male with past medical history of anginal pain, chronic kidney disease, diabetes, GERD, hyperlipidemia, hypertension, obstructive sleep apnea and recently diagnosed left renal mass consistent with small renal cell carcinoma.  He was seen in consultation by Dr. Maryelizabeth Kaufmann on 04/12/2021 and deemed an appropriate candidate for microwave ablation of the left renal mass.  On 05/15/2021 he underwent CT guided microwave ablation of the left renal mass with anesthesia assistance.  The procedure was performed without immediate complications and he was admitted for overnight observation.  Overnight the patient did well with no immediate complaints. On the morning of discharge he was stable.  He was able to void, tolerate his diet and ambulate without difficulty.  He denied fever, headache, chest pain, dyspnea, cough, abdominal/back pain, nausea, vomiting or bleeding.  Follow-up hemoglobin was stable, creatinine 1.84 up slightly from 1.26.  Potassium 3.3.  Consults: anesthesia  Significant Diagnostic Studies:  Results for orders placed or performed during the hospital encounter of 05/15/21  Protime-INR  Result Value Ref Range   Prothrombin Time 13.2 11.4 - 15.2 seconds   INR 1.0  0.8 - 1.2  Basic metabolic panel  Result Value Ref Range   Sodium 138 135 - 145 mmol/L   Potassium 3.1 (L) 3.5 - 5.1 mmol/L   Chloride 100 98 - 111 mmol/L   CO2 30 22 - 32 mmol/L   Glucose, Bld 104 (H) 70 - 99 mg/dL   BUN 19 8 - 23 mg/dL   Creatinine, Ser 1.26 (H) 0.61 - 1.24 mg/dL   Calcium 9.7 8.9 - 10.3  mg/dL   GFR, Estimated >60 >60 mL/min   Anion gap 8 5 - 15  Glucose, capillary  Result Value Ref Range   Glucose-Capillary 102 (H) 70 - 99 mg/dL  Glucose, capillary  Result Value Ref Range   Glucose-Capillary 144 (H) 70 - 99 mg/dL   Comment 1 Notify RN    Comment 2 Document in Chart   CBC with Differential/Platelet  Result Value Ref Range   WBC 10.6 (H) 4.0 - 10.5 K/uL   RBC 3.92 (L) 4.22 - 5.81 MIL/uL   Hemoglobin 13.0 13.0 - 17.0 g/dL   HCT 37.6 (L) 39.0 - 52.0 %   MCV 95.9 80.0 - 100.0 fL   MCH 33.2 26.0 - 34.0 pg   MCHC 34.6 30.0 - 36.0 g/dL   RDW 12.8 11.5 - 15.5 %   Platelets 220 150 - 400 K/uL   nRBC 0.0 0.0 - 0.2 %   Neutrophils Relative % 81 %   Neutro Abs 8.6 (H) 1.7 - 7.7 K/uL   Lymphocytes Relative 8 %   Lymphs Abs 0.8 0.7 - 4.0 K/uL   Monocytes Relative 11 %   Monocytes Absolute 1.1 (H) 0.1 - 1.0 K/uL   Eosinophils Relative 0 %   Eosinophils Absolute 0.0 0.0 - 0.5 K/uL   Basophils Relative 0 %   Basophils Absolute 0.0 0.0 - 0.1 K/uL   Immature Granulocytes 0 %   Abs Immature Granulocytes 0.04 0.00 - 0.07 K/uL  Basic metabolic panel  Result Value Ref Range   Sodium 135 135 - 145 mmol/L   Potassium 3.3 (L) 3.5 - 5.1 mmol/L   Chloride 100 98 - 111 mmol/L   CO2 27 22 - 32 mmol/L   Glucose, Bld 198 (H) 70 - 99 mg/dL   BUN 29 (H) 8 - 23 mg/dL   Creatinine, Ser 1.84 (H) 0.61 - 1.24 mg/dL   Calcium 8.9 8.9 - 10.3 mg/dL   GFR, Estimated 41 (L) >60 mL/min   Anion gap 8 5 - 15  Glucose, capillary  Result Value Ref Range   Glucose-Capillary 287 (H) 70 - 99 mg/dL  Glucose, capillary  Result Value Ref Range   Glucose-Capillary 164 (H) 70 - 99 mg/dL     Treatments:   Discharge Exam: Blood pressure 112/81, pulse 75, temperature 97.8 F (36.6 C), temperature source Oral, resp. rate 18, height 6' 1"  (1.854 m), weight 173 lb 1 oz (78.5 kg), SpO2 96 %. Patient awake, alert.  Chest clear to auscultation bilaterally.  Heart with regular rate and rhythm.  Abdomen  soft, positive bowel sounds, nontender.  Puncture site left flank clean, dry, nontender, no discrete hematoma.  No lower extremity edema.  Disposition: Discharge disposition: 01-Home or Self Care       Discharge Instructions     Call MD for:  difficulty breathing, headache or visual disturbances   Complete by: As directed    Call MD for:  extreme fatigue   Complete by: As directed    Call MD for:  hives  Complete by: As directed    Call MD for:  persistant dizziness or light-headedness   Complete by: As directed    Call MD for:  persistant nausea and vomiting   Complete by: As directed    Call MD for:  redness, tenderness, or signs of infection (pain, swelling, redness, odor or green/yellow discharge around incision site)   Complete by: As directed    Call MD for:  severe uncontrolled pain   Complete by: As directed    Call MD for:  temperature >100.4   Complete by: As directed    Change dressing (specify)   Complete by: As directed    May change bandage over left flank puncture site region daily for the next 3 days.  May apply Band-Aid to site.  May wash site with soap and water.   Diet - low sodium heart healthy   Complete by: As directed    Discharge instructions   Complete by: As directed    May resume home medications, stay well-hydrated, avoid strenuous activity for the next 3 to 4 days.   Driving Restrictions   Complete by: As directed    No driving for the next 24 hours   Increase activity slowly   Complete by: As directed    Lifting restrictions   Complete by: As directed    Avoid strenuous activity or heavy lifting for the next 3 to 4 days   May shower / Bathe   Complete by: As directed    May walk up steps   Complete by: As directed       Allergies as of 05/16/2021       Reactions   Sulfa Antibiotics Hives, Swelling   Vicodin [hydrocodone-acetaminophen] Itching   Patient states he can take Tylenol without difficulty.        Medication List      TAKE these medications    amLODipine-benazepril 10-40 MG capsule Commonly known as: LOTREL TAKE 1 CAPSULE BY MOUTH EVERY DAY   hydrALAZINE 50 MG tablet Commonly known as: APRESOLINE Take 1 tablet (50 mg total) by mouth 3 (three) times daily.   hydrochlorothiazide 25 MG tablet Commonly known as: HYDRODIURIL Take 1 tablet (25 mg total) by mouth daily.   Januvia 100 MG tablet Generic drug: sitaGLIPtin TAKE 1 TABLET BY MOUTH EVERY DAY What changed: how much to take   Lancets Misc Use as directed E11.9   metFORMIN 500 MG 24 hr tablet Commonly known as: GLUCOPHAGE-XR TAKE 2 TABLETS BY MOUTH EVERY DAY WITH BREAKFAST What changed: See the new instructions.   nebivolol 10 MG tablet Commonly known as: BYSTOLIC TAKE 2 TABLETS (20 MG TOTAL) BY MOUTH DAILY. What changed:  how much to take how to take this when to take this additional instructions   omeprazole 40 MG capsule Commonly known as: PRILOSEC TAKE 1 CAPSULE BY MOUTH EVERY DAY What changed: how much to take   ONE TOUCH ULTRA 2 w/Device Kit Use as directed  E11.9   potassium chloride 10 MEQ tablet Commonly known as: Klor-Con M10 Take 1 tablet (10 mEq total) by mouth daily.   pravastatin 80 MG tablet Commonly known as: PRAVACHOL Take 1 tablet (80 mg total) by mouth daily.               Discharge Care Instructions  (From admission, onward)           Start     Ordered   05/16/21 0000  Change dressing (specify)  Comments: May change bandage over left flank puncture site region daily for the next 3 days.  May apply Band-Aid to site.  May wash site with soap and water.   05/16/21 1119            Follow-up Information     Mugweru, Wille Glaser, MD Follow up.   Specialties: Interventional Radiology, Diagnostic Radiology, Radiology Why: Dr. Maryelizabeth Kaufmann  will schedule you for follow-up MRI of your abdomen in 3 months; call [phone] or [phone] with any questions Contact information: [address] [place] [address] [phone]         Janith Lima, MD Follow up.   Specialty: Urology Why: Follow-up with Dr. Abner Greenspan as scheduled Contact information: [place] [address] [phone]                  Electronically Signed: D. Rowe Robert, PA-C 05/16/2021, 11:25 AM   I have spent Less Than 30 Minutes discharging [place].

## 2021-05-16 NOTE — Discharge Instructions (Signed)
May resume home medications, stay well-hydrated, avoid strenuous activity for the next 3 to 4 days.  May wash puncture site left flank with soap and water.  May apply bandage to site daily for the next 3 days.  Call [phone] with any questions

## 2021-05-16 NOTE — Progress Notes (Signed)
Transition of Care (TOC) Screening Note  Patient Details  Name: [name] Date of Birth: [birth year]  Transition of Care [facility]) CM/SW Contact:    Sherie Don, LCSW Phone Number: 05/16/2021, 10:35 AM  Transition of Care Department [facility]) has reviewed patient and no TOC needs have been identified at this time. We will continue to monitor patient advancement through interdisciplinary progression rounds. If new patient transition needs arise, please place a TOC consult.

## 2021-05-16 NOTE — Plan of Care (Signed)
  Problem: Education: Goal: Required Educational Video(s) Outcome: Adequate for Discharge   Problem: Clinical Measurements: Goal: Postoperative complications will be avoided or minimized Outcome: Adequate for Discharge   Problem: Skin Integrity: Goal: Demonstration of wound healing without infection will improve Outcome: Adequate for Discharge   

## 2021-06-19 ENCOUNTER — Other Ambulatory Visit: Payer: Self-pay

## 2021-06-19 ENCOUNTER — Encounter: Payer: Self-pay | Admitting: Endocrinology

## 2021-06-19 ENCOUNTER — Ambulatory Visit: Payer: 59 | Admitting: Endocrinology

## 2021-06-19 VITALS — BP 116/74 | HR 77 | Ht 73.0 in | Wt 178.0 lb

## 2021-06-19 DIAGNOSIS — Z794 Long term (current) use of insulin: Secondary | ICD-10-CM | POA: Diagnosis not present

## 2021-06-19 DIAGNOSIS — E042 Nontoxic multinodular goiter: Secondary | ICD-10-CM | POA: Diagnosis not present

## 2021-06-19 DIAGNOSIS — E119 Type 2 diabetes mellitus without complications: Secondary | ICD-10-CM

## 2021-06-19 DIAGNOSIS — N179 Acute kidney failure, unspecified: Secondary | ICD-10-CM

## 2021-06-19 DIAGNOSIS — E0590 Thyrotoxicosis, unspecified without thyrotoxic crisis or storm: Secondary | ICD-10-CM

## 2021-06-19 DIAGNOSIS — E0800 Diabetes mellitus due to underlying condition with hyperosmolarity without nonketotic hyperglycemic-hyperosmolar coma (NKHHC): Secondary | ICD-10-CM | POA: Diagnosis not present

## 2021-06-19 LAB — POCT GLYCOSYLATED HEMOGLOBIN (HGB A1C): Hemoglobin A1C: 6 % — AB (ref 4.0–5.6)

## 2021-06-19 NOTE — Patient Instructions (Addendum)
Blood tests are requested for you today.  We'll let you know about the results.   ?Let's recheck the ultrasound.  you will receive a phone call, about a day and time for an appointment.   ?most of the time, a "lumpy thyroid" will eventually become more overactive.  this is usually a slow process, happening over the span of many years.   ?Please come back for a follow-up appointment in 6 months.   ?

## 2021-06-19 NOTE — Progress Notes (Signed)
? ?Subjective:  ? ? Patient ID: [name], male    DOB: 1960-09-02, 61 y.o.   MRN: [id number] ? ?HPI ?Pt returns for f/u of multinodular goiter (dx'ed 2016; nuc med scan and US showed multinodular goiter, right lobe >> left; bx in 2016 showed beth cat 2; he has never been on thyroid medication; f/u US in 2021 showed slight enlargement; TSH is slightly low).  He does not notice the goiter. pt states he feels well in general.  Specifically, she denies palpitations and tremor.    ?Past Medical History:  ?Diagnosis Date  ? Anginal pain ([place])   ? Chest pain 02/13/2012  ? echo - EF >55%;mod concentric L ventricular hypertrophy; atrial septum aneurysmal; patent foramen ovale suspected on color flow, LA mil/mod dilation; RV systolic pressure 85IDPO  ? Chest pain 06/09/2007  ? echo - EF 45-50%; interarterial shunt; mild valvular regurgitation;   ? Chest pain 06/09/2007  ? Myoview - EF 53%; normal perfusion all regions;EKG negative for ischemia   ? Chronic kidney disease   ? Diabetes mellitus   ? type II  ? DIABETES MELLITUS, TYPE II 08/29/2009  ? GERD (gastroesophageal reflux disease) 08/12/2010  ? Hemorrhoids   ? History of anal fissures   ? Hyperlipidemia   ? HYPERLIPIDEMIA 08/29/2009  ? Hypertension   ? HYPERTENSION 01/14/2008  ? Lightheadedness 05/10/2002  ? 30d monitor unremarkable  ? LVH (left ventricular hypertrophy)   ? with systolic dysfunction by echo 10/03, EF 45-50% - Dr. Melvern Banker  ? Multinodular goiter 12/06/2015  ? Nonspecific ST-T wave electrocardiographic changes 08/11/2006  ? Myoview - EF 49%; normal perfusion all regions, EKG negative for ischemia  ? OSA (obstructive sleep apnea)   ? Preventative health care 08/11/2010  ? Sleep apnea 08/13/2008  ? AHI 1.2; sleep study 10/2007 - AHI 9.47at 15cm H2O and 31.3 at 16cm H2O  ? SLEEP APNEA, OBSTRUCTIVE 01/14/2008  ? ? ?Past Surgical History:  ?Procedure Laterality Date  ? ANAL SPHINCTEROTOMY  08/20/11  ? CARDIAC CATHETERIZATION  10/23/2008  ? normal coronary  arteries and LV function  ? COLONOSCOPY    ? IR RADIOLOGIST EVAL & MGMT  04/12/2021  ? POLYPECTOMY    ? RADIOLOGY WITH ANESTHESIA N/A 05/15/2021  ? Procedure: MICROWAVE ABLATION;  Surgeon: Michaelle Birks, MD;  Location: [facility];  Service: Radiology;  Laterality: N/A;  ? ? ?Social History  ? ?Socioeconomic History  ? Marital status: Single  ?  Spouse name: Not on file  ? Number of children: 1  ? Years of education: Not on file  ? Highest education level: Not on file  ?Occupational History  ? Occupation: work as Administrator  ?  Employer: [employer]  ?Tobacco Use  ? Smoking status: Every Day  ?  Packs/day: 0.50  ?  Years: 30.00  ?  Pack years: 15.00  ?  Types: Cigarettes  ? Smokeless tobacco: Never  ?Vaping Use  ? Vaping Use: Never used  ?Substance and Sexual Activity  ? Alcohol use: Yes  ?  Alcohol/week: 3.0 standard drinks  ?  Types: 3 Standard drinks or equivalent per week  ?  Comment: occaisionally  ? Drug use: No  ? Sexual activity: Not on file  ?Other Topics Concern  ? Not on file  ?Social History Narrative  ? Not on file  ? ?Social Determinants of Health  ? ?Financial Resource Strain: Not on file  ?Food Insecurity: Not on file  ?Transportation Needs: Not on file  ?Physical Activity: Not  on file  ?Stress: Not on file  ?Social Connections: Not on file  ?Intimate Partner Violence: Not on file  ? ? ?Current Outpatient Medications on File Prior to Visit  ?Medication Sig Dispense Refill  ? amLODipine-benazepril (LOTREL) 10-40 MG capsule TAKE 1 CAPSULE BY MOUTH EVERY DAY 90 capsule 1  ? Blood Glucose Monitoring Suppl (ONE TOUCH ULTRA 2) w/Device KIT Use as directed  E11.9 1 each 0  ? hydrALAZINE (APRESOLINE) 50 MG tablet Take 1 tablet (50 mg total) by mouth 3 (three) times daily. 270 tablet 3  ? hydrochlorothiazide (HYDRODIURIL) 25 MG tablet Take 1 tablet (25 mg total) by mouth daily. 90 tablet 3  ? JANUVIA 100 MG tablet TAKE 1 TABLET BY MOUTH EVERY DAY (Patient taking differently: Take 100 mg by mouth daily.) 90  tablet 3  ? Lancets MISC Use as directed E11.9 100 each 11  ? metFORMIN (GLUCOPHAGE-XR) 500 MG 24 hr tablet TAKE 2 TABLETS BY MOUTH EVERY DAY WITH BREAKFAST (Patient taking differently: Take 1,000 mg by mouth daily with breakfast.) 180 tablet 3  ? nebivolol (BYSTOLIC) 10 MG tablet TAKE 2 TABLETS (20 MG TOTAL) BY MOUTH DAILY. (Patient taking differently: Take 20 mg by mouth daily.) 60 tablet 11  ? omeprazole (PRILOSEC) 40 MG capsule TAKE 1 CAPSULE BY MOUTH EVERY DAY (Patient taking differently: Take 40 mg by mouth daily.) 90 capsule 3  ? potassium chloride (KLOR-CON M10) 10 MEQ tablet Take 1 tablet (10 mEq total) by mouth daily. 90 tablet 3  ? pravastatin (PRAVACHOL) 80 MG tablet Take 1 tablet (80 mg total) by mouth daily. 90 tablet 3  ? ?No current facility-administered medications on file prior to visit.  ? ? ?Allergies  ?Allergen Reactions  ? Sulfa Antibiotics Hives and Swelling  ? Vicodin [Hydrocodone-Acetaminophen] Itching  ?  Patient states he can take Tylenol without difficulty.  ? ? ?Family History  ?Problem Relation Age of Onset  ? Hypertension Mother   ? Heart disease Father   ? Colon cancer Neg Hx   ? Thyroid disease Neg Hx   ? ? ?BP 116/74 (BP Location: Left Arm, Patient Position: Sitting, Cuff Size: Normal)   Pulse 77   Ht 6' 1"  (1.854 m)   Wt 178 lb (80.7 kg)   SpO2 97%   BMI 23.48 kg/m?  ? ? ?Review of Systems ?Denies fever.   ?   ?Objective:  ? Physical Exam ?VITAL SIGNS:  See vs page.   ?GENERAL: no distress.  ?Neck: small MNG, R>L.   ? ? ?Lab Results  ?Component Value Date  ? TSH 0.64 06/19/2021  ? ?   ?Assessment & Plan:  ?Hyperthyroidism: better.  No medication is needed now, but she is at risk for recurrence, so we'll follow ?MNG: Let's recheck the ultrasound.  you will receive a phone call, about a day and time for an appointment.  ? ?

## 2021-06-20 LAB — TSH: TSH: 0.64 u[IU]/mL (ref 0.35–5.50)

## 2021-06-20 LAB — T4, FREE: Free T4: 1.1 ng/dL (ref 0.60–1.60)

## 2021-06-21 ENCOUNTER — Ambulatory Visit
Admission: RE | Admit: 2021-06-21 | Discharge: 2021-06-21 | Disposition: A | Discharge status: Home / Self Care | Payer: 59 | Source: Ambulatory Visit | Attending: Endocrinology | Admitting: Endocrinology

## 2021-06-21 DIAGNOSIS — E042 Nontoxic multinodular goiter: Secondary | ICD-10-CM

## 2021-06-21 IMAGING — US US THYROID
1 series · 13 of 25 positions shown · non-contrast
Comparison: [23], multiple priors dating back to [23]

CLINICAL DATA: Abnormal thyroid labs

EXAM:
THYROID ULTRASOUND
TECHNIQUE: Ultrasound examination of the thyroid gland and adjacent soft
tissues was performed.

[Series 1: us thyroid · 0.07mm/px · 13 of 77 slices shown]
[im 1/77]
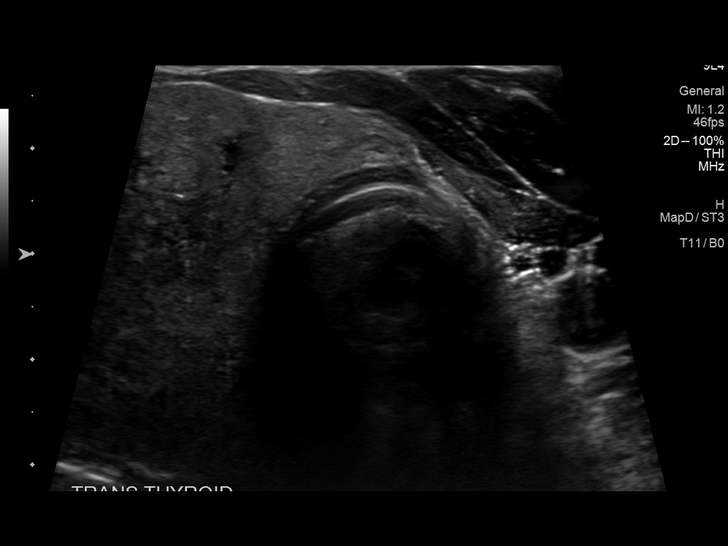
[im 7/77]
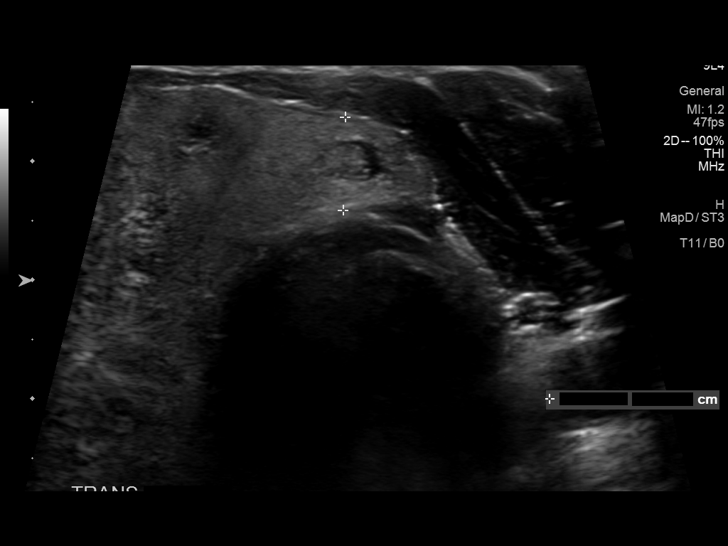
[im 13/77]
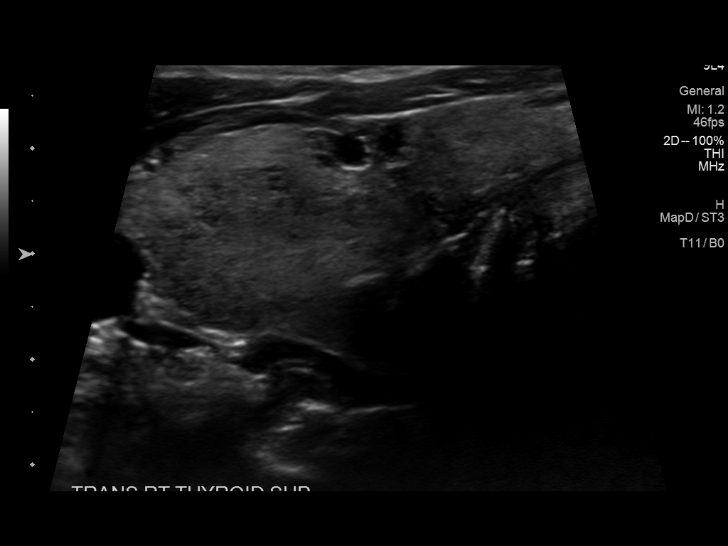
[im 20/77]
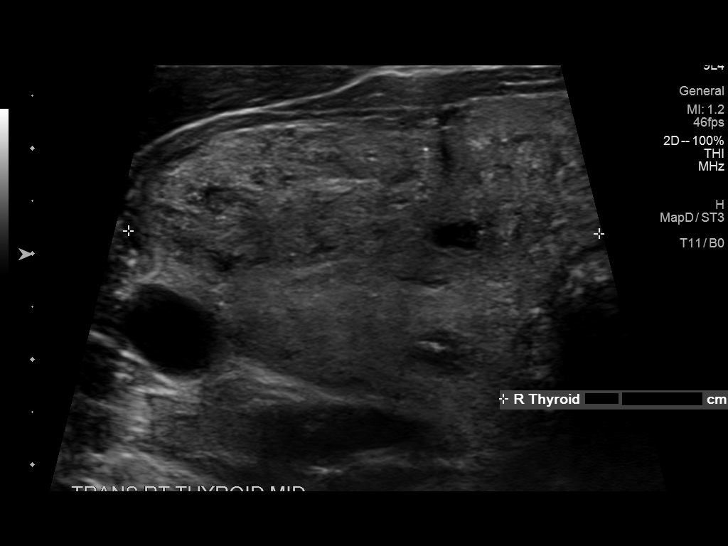
[im 26/77]
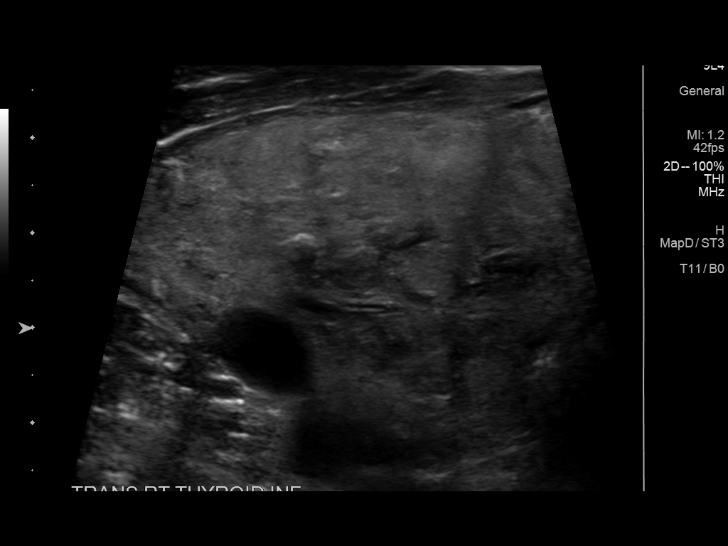
[im 32/77]
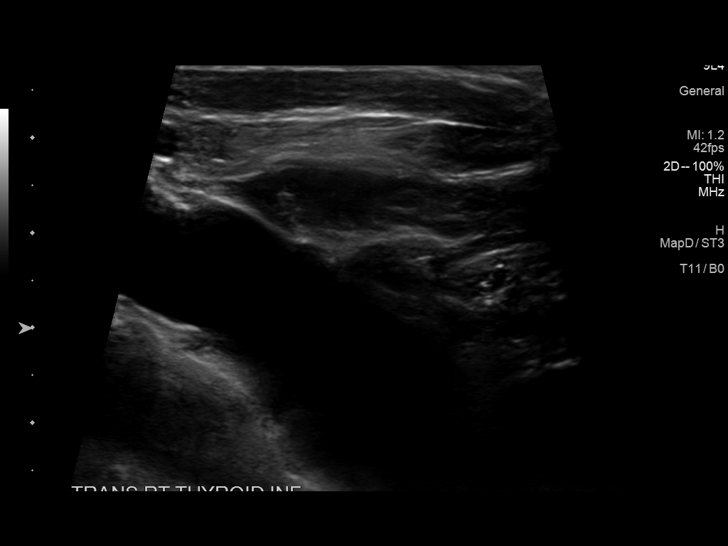
[im 39/77]
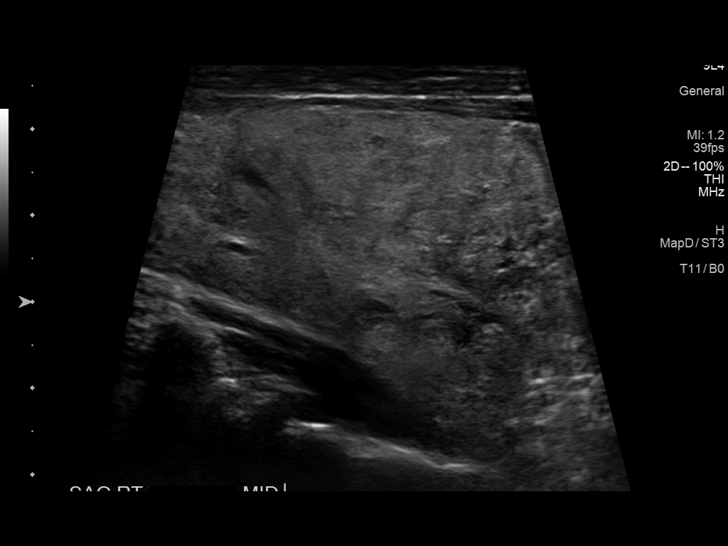
[im 45/77]
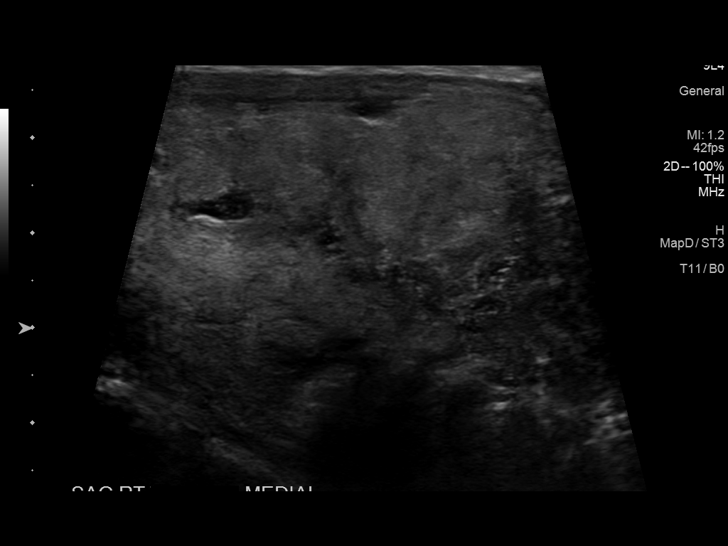
[im 51/77]
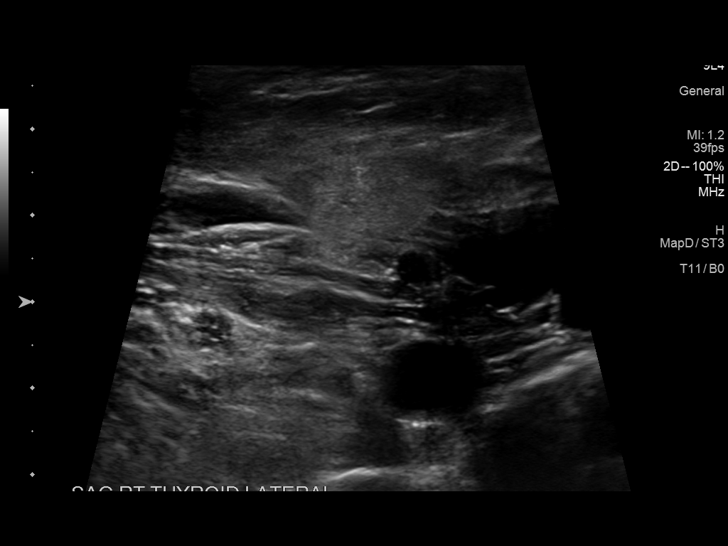
[im 58/77]
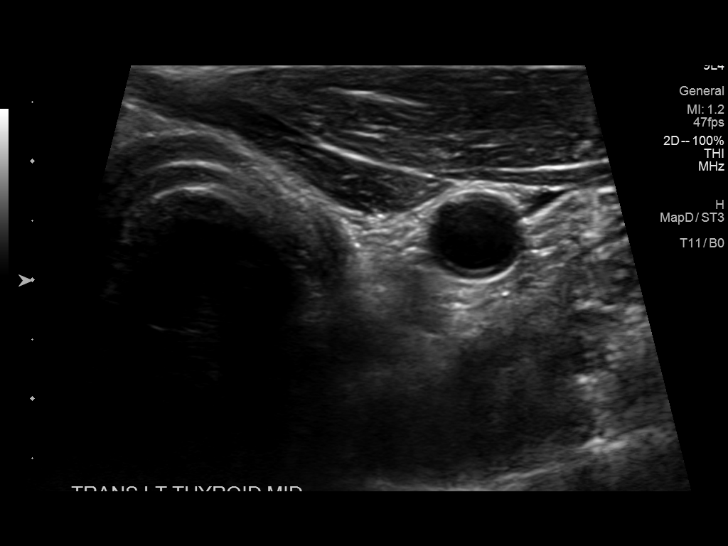
[im 64/77]
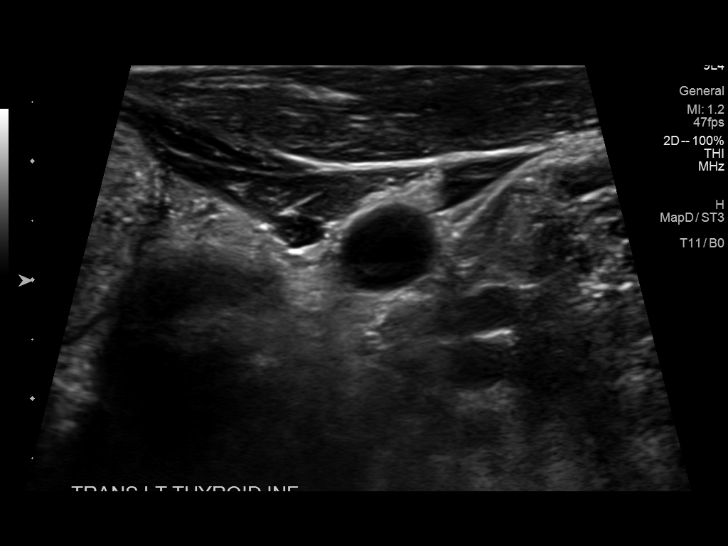
[im 70/77]
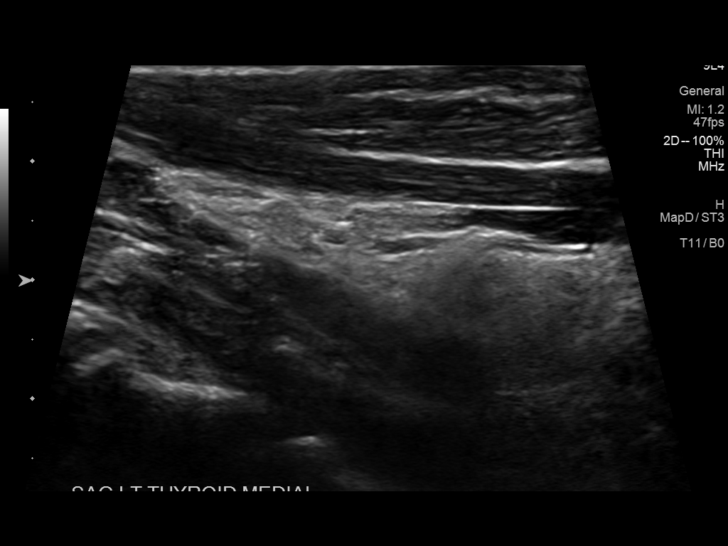
[im 77/77]
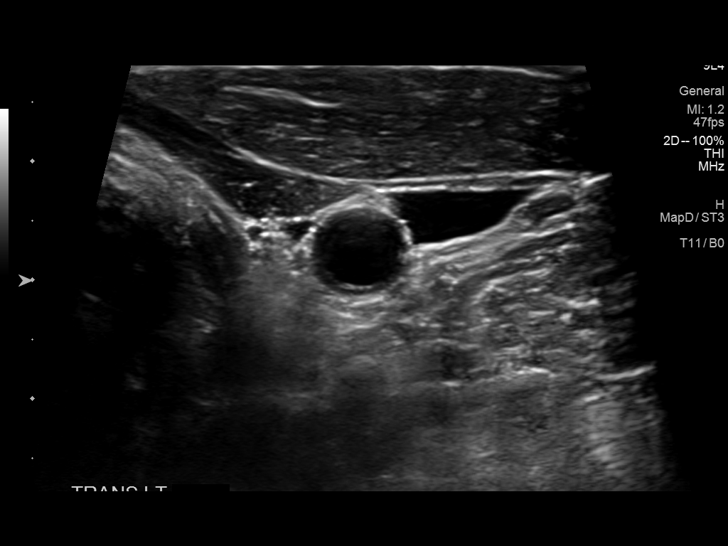

[13 of 25 positions shown; findings below may reference images not displayed]

FINDINGS: Parenchymal Echotexture: Moderately heterogenous

Isthmus: 0.8 cm

Right lobe: 8.3 x 3.7 x 4.5 cm

Left lobe: 4.0 x 1.1 x 0.6 cm

_________________________________________________________

Estimated total number of nodules >/= 1 cm: 1

Number of spongiform nodules >/=  2 cm not described below (TR1): 0

Number of mixed cystic and solid nodules >/= 1.5 cm not described
below (TR2): 0

_________________________________________________________

Nodule labeled 1 is a solid isoechoic nodule with macrocalcification
(TR 4) that occupies the majority of the inferior right thyroid lobe
that measures 6.0 x 4.4 x 2.6 cm on today's exam, previously
measuring up to 6 cm in [23]. it does not appear significantly
changed in size or morphology.
IMPRESSION: Again seen is a solitary TR 4 nodule that occupies the majority of
the inferior right thyroid lobe measuring up to 6 cm on today's
exam. If this nodule has been previously biopsied, correlate with
biopsy results. If no tissue diagnosis has been previously obtained,
this nodule demonstrates greater than 5 year stability as it has not
significantly changed in size or morphology since [23], and
therefore would be considered benign in etiology.

The above is in keeping with the ACR TI-RADS recommendations - [HOSPITAL] [23];[DATE].

## 2021-07-09 ENCOUNTER — Other Ambulatory Visit: Payer: Self-pay | Admitting: Internal Medicine

## 2021-07-09 NOTE — Telephone Encounter (Signed)
Please refill as per office routine med refill policy (all routine meds to be refilled for 3 mo or monthly (per pt preference) up to one year from last visit, then month to month grace period for 3 mo, then further med refills will have to be denied) ? ?

## 2021-07-18 ENCOUNTER — Other Ambulatory Visit: Payer: Self-pay | Admitting: Urology

## 2021-07-19 ENCOUNTER — Other Ambulatory Visit: Payer: Self-pay | Admitting: Urology

## 2021-07-19 DIAGNOSIS — D49512 Neoplasm of unspecified behavior of left kidney: Secondary | ICD-10-CM

## 2021-07-25 ENCOUNTER — Other Ambulatory Visit: Payer: Self-pay | Admitting: Interventional Radiology

## 2021-07-25 DIAGNOSIS — N2889 Other specified disorders of kidney and ureter: Secondary | ICD-10-CM

## 2021-08-08 ENCOUNTER — Ambulatory Visit
Admission: RE | Admit: 2021-08-08 | Discharge: 2021-08-08 | Disposition: A | Discharge status: Home / Self Care | Payer: 59 | Source: Ambulatory Visit | Attending: Urology | Admitting: Urology

## 2021-08-08 DIAGNOSIS — D49512 Neoplasm of unspecified behavior of left kidney: Secondary | ICD-10-CM

## 2021-08-08 IMAGING — MR MR ABDOMEN WO/W CM
11 of 17 series · 29 of 48 positions shown · IV contrast (multihance)
Comparison: MRI abdomen [DATE]

CLINICAL DATA: Renal mass status post ablation, follow-up

EXAM:
MRI ABDOMEN WITHOUT AND WITH CONTRAST
TECHNIQUE: Multiplanar multisequence MR imaging of the abdomen was performed
both before and after the administration of intravenous contrast.
CONTRAST:  15mL MULTIHANCE GADOBENATE DIMEGLUMINE 529 MG/ML IV SOLN

[Series 3: T2 · coronal · 5.0mm · 1.45mm/px · 2 of 33 slices shown (1 of 3)]
[im 1/33]
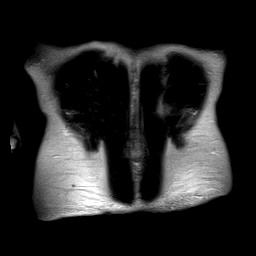
[im 33/33]
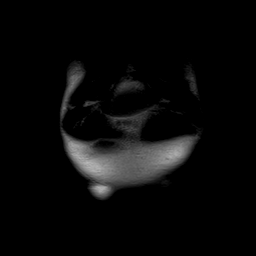

[Series 4: T2 · axial · 5.0mm · 1.41mm/px · z∈[-71,+179]mm · 2 of 41 slices shown (2 of 3)]
[im 1/41]
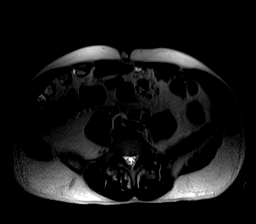
[im 41/41]
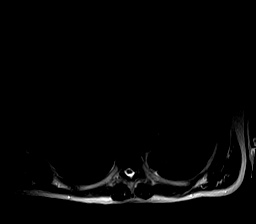

[Series 5: axial in out · axial · 5.5mm · 0.70mm/px · z∈[-74,+173]mm · 4 of 80 slices shown]
[im 1/80]
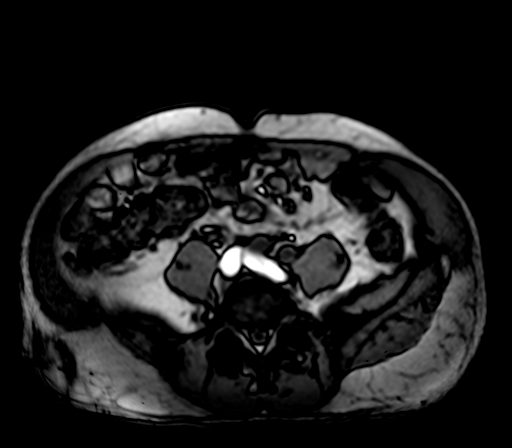
[im 27/80]
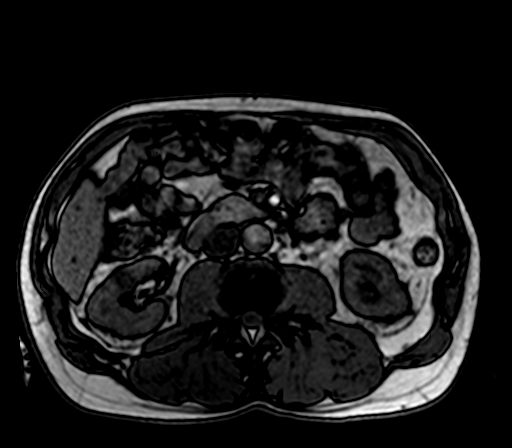
[im 53/80]
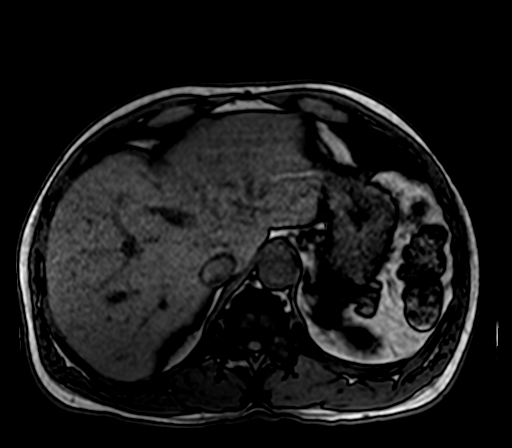
[im 80/80]
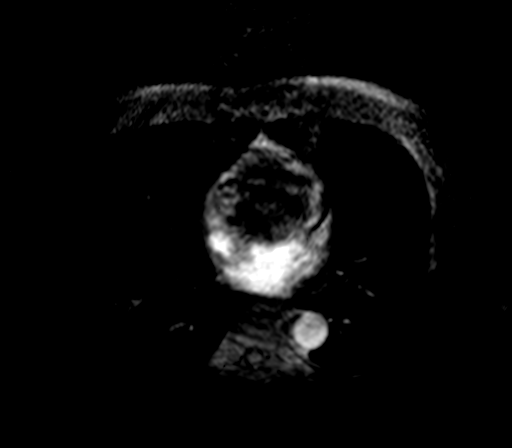

[Series 6: axial tru fisp · axial · 5.0mm · 1.41mm/px · z∈[-64,+172]mm · 2 of 42 slices shown]
[im 1/42]
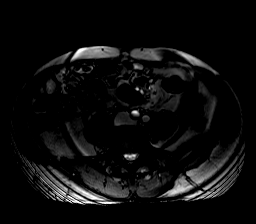
[im 42/42]
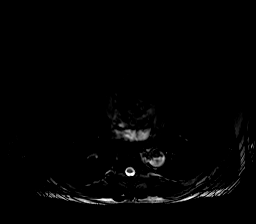

[Series 7: T2 · axial · 5.0mm · 0.70mm/px · z∈[-51,+193]mm · 2 of 40 slices shown (3 of 3)]
[im 1/40]
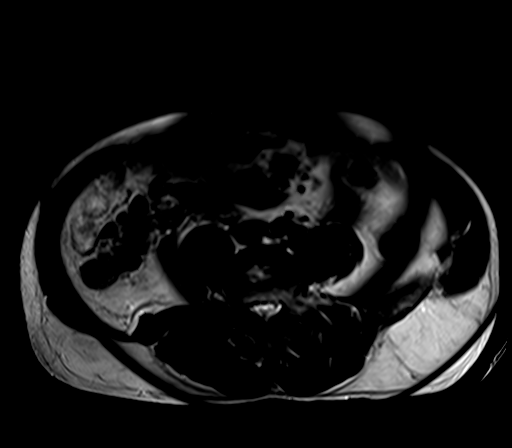
[im 40/40]
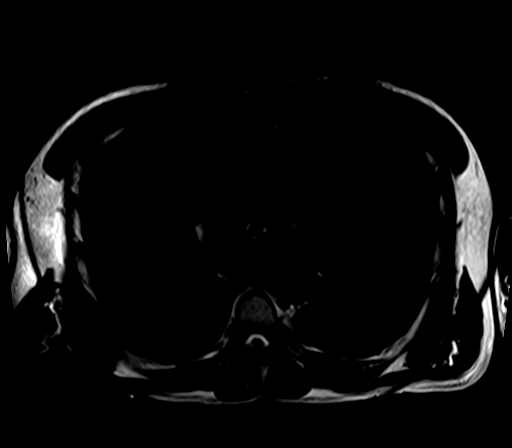

[Series 8: ep2d_[id number]_500_800_p2_trig · axial · 5.0mm · 1.88mm/px · z∈[-51,+193]mm · 5 of 120 slices shown]
[im 1/120]
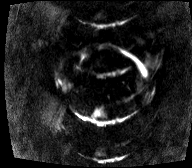
[im 30/120]
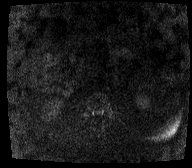
[im 60/120]
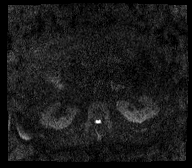
[im 90/120]
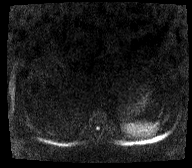
[im 120/120]
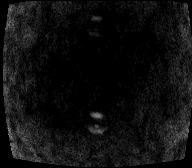

[Series 9: ep2d_[id number]_p2_trig_adc · axial · 5.0mm · 1.88mm/px · 1 of 40 slices shown]
[im 1/40]
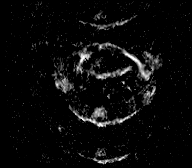

[Series 10: T1 dynamic · axial · non-contrast · 2.0mm · 0.78mm/px · z∈[-53,+153]mm · 3 of 104 slices shown]
[im 1/104]
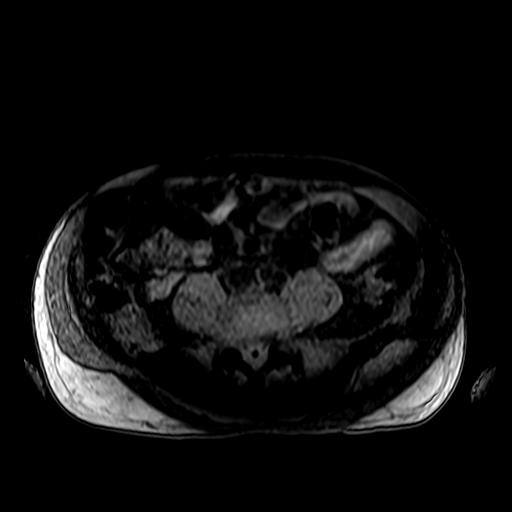
[im 52/104]
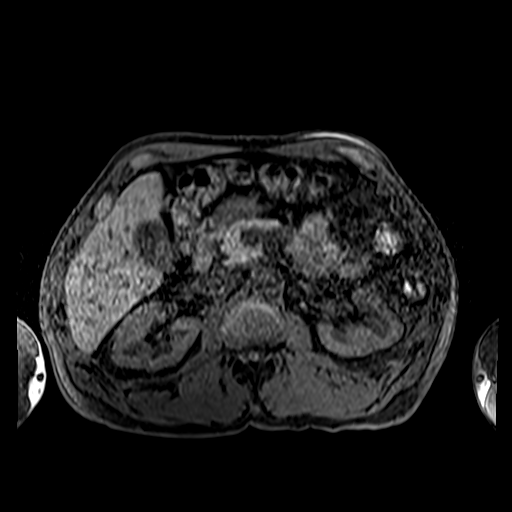
[im 104/104]
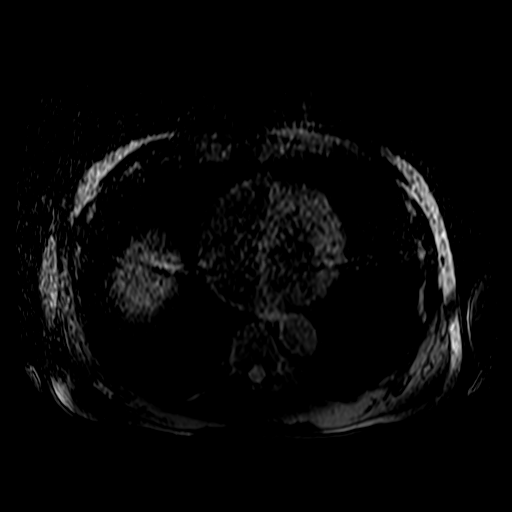

[Series 11: post 30 sec · axial · 2.0mm · 0.78mm/px · z∈[-53,+153]mm · 3 of 104 slices shown]
[im 1/104]
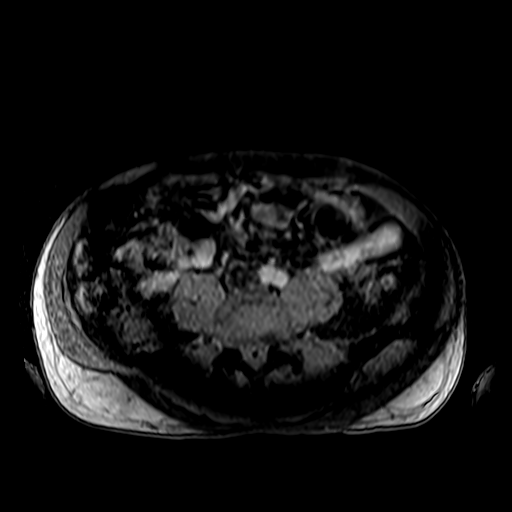
[im 52/104]
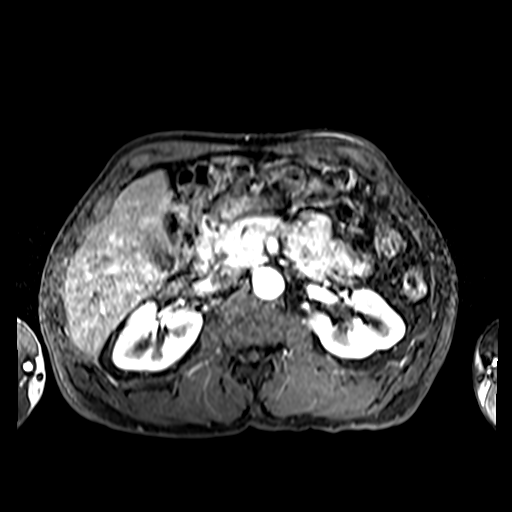
[im 104/104]
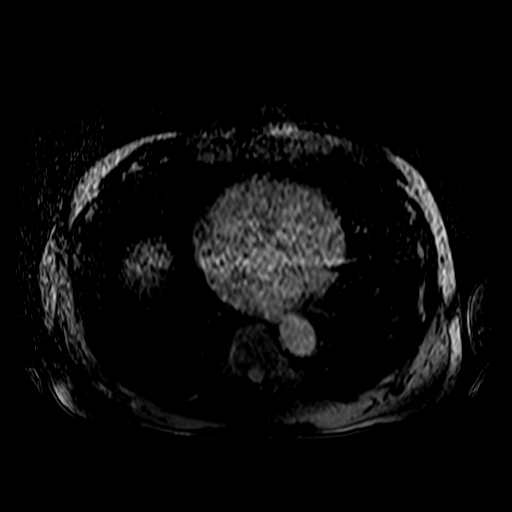

[Series 12: post 30 sec_sub · axial · 2.0mm · 0.78mm/px · z∈[-53,+153]mm · 3 of 104 slices shown]
[im 1/104]
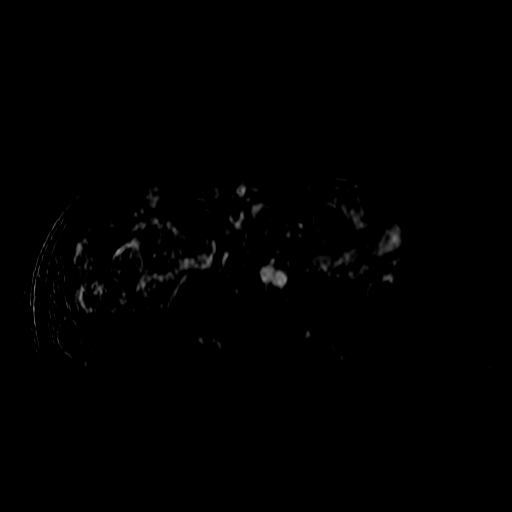
[im 52/104]
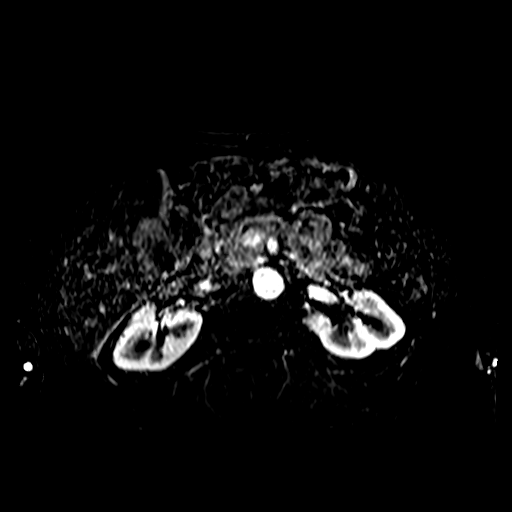
[im 104/104]
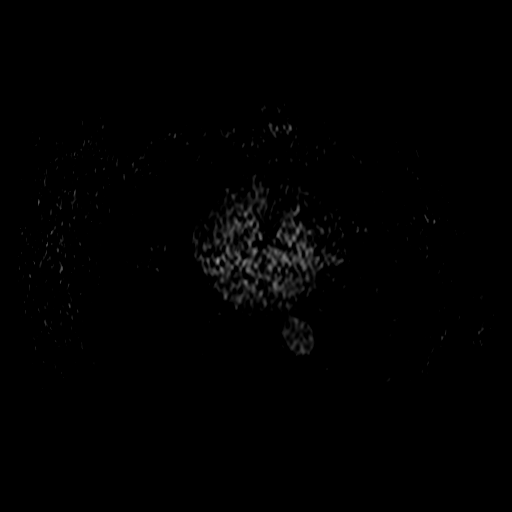

[Series 13: post 60 sec · axial · 2.0mm · 0.78mm/px · z∈[-53,+49]mm · 2 of 104 slices shown]
[im 1/104]
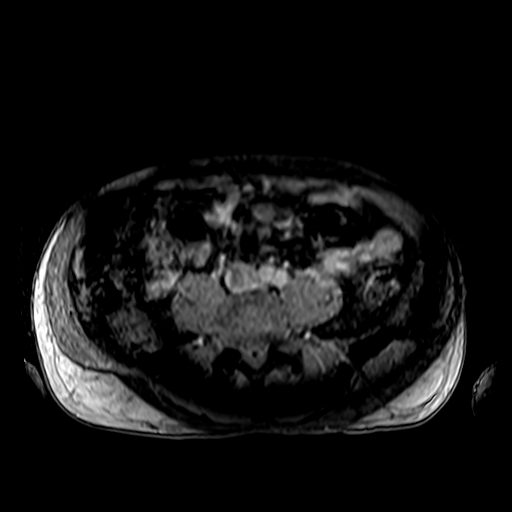
[im 52/104]
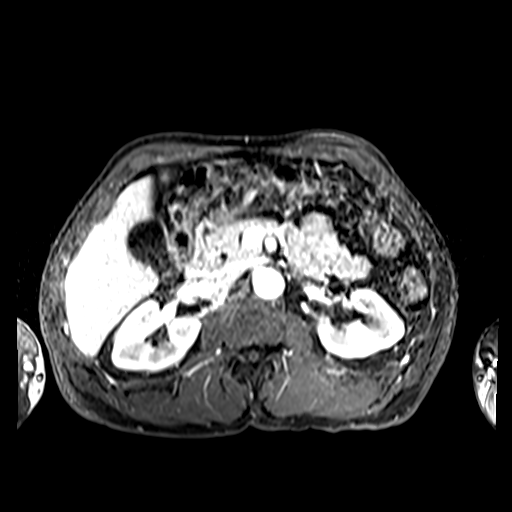

[29 of 48 positions shown; findings below may reference images not displayed]

FINDINGS: Limited study due to motion.

Lower chest: No acute findings.

Hepatobiliary: Liver is normal in size and contour with no
suspicious mass identified. A few small hepatic cysts are identified
measuring up to 1.2 cm in the left lobe. Gallbladder appears normal.
No biliary ductal dilatation visualized.

Pancreas: No mass, inflammatory changes, or other parenchymal
abnormality identified.

Spleen:  Within normal limits in size and appearance.

Adrenals/Urinary Tract: Adrenal glands appear normal. Interval
ablation changes at the posterior upper pole left kidney with
ablated lesion measuring 1.3 cm and associated surrounding
hyperintense T1 signal, with no suspicious enhancement visualized.
Redemonstration of a 2.1 cm complex cystic mass in the lower pole
right kidney which demonstrates thick and irregular internal
septations with mild enhancement measuring up to 7 mm in thickness.
A few additional subcentimeter simple appearing renal cysts
identified. No hydronephrosis.

Stomach/Bowel: Visualized portions within the abdomen are
unremarkable.

Vascular/Lymphatic: No pathologically enlarged lymph nodes
identified. Bilateral renal veins are within normal limits. No
abdominal aortic aneurysm demonstrated.

Other:  No ascites.

Musculoskeletal: No suspicious bone lesions identified.
IMPRESSION: 1. 2.1 cm complex cystic mass in the lower pole right kidney as
described, Bosniak 3. Consider consultation for possible treatment.
2. Interval ablation of the mass in the upper pole left kidney with
no evidence of recurrent or residual enhancement.
3. No lymphadenopathy or evidence of renal vein involvement
bilaterally.
4. Small hepatic cysts.

## 2021-08-08 MED ORDER — GADOBENATE DIMEGLUMINE 529 MG/ML IV SOLN
15.0000 mL | Freq: Once | INTRAVENOUS | Status: AC | PRN
  Administered 2021-08-08: 15 mL via INTRAVENOUS

## 2021-08-13 ENCOUNTER — Ambulatory Visit
Admission: RE | Admit: 2021-08-13 | Discharge: 2021-08-13 | Disposition: A | Discharge status: Home / Self Care | Payer: 59 | Source: Ambulatory Visit | Attending: Interventional Radiology | Admitting: Interventional Radiology

## 2021-08-13 ENCOUNTER — Encounter: Payer: Self-pay | Admitting: *Deleted

## 2021-08-13 DIAGNOSIS — N2889 Other specified disorders of kidney and ureter: Secondary | ICD-10-CM

## 2021-08-13 HISTORY — PX: IR RADIOLOGIST EVAL & MGMT: IMG5224

## 2021-08-13 NOTE — Progress Notes (Signed)
? ?Referring Physician(s): ?Gay,Matthew R ? ?Visit Reason: ?The patient is seen in follow up today s/p L renal mass ablation. ? ?Virtual Visit via Telephone Note ?  ?I connected with [name] at 12:00 PM EST by telephone and verified that I am speaking with the correct person using two identifiers. ?  ?I discussed the limitations, risks, security and privacy concerns of performing an evaluation and management service by telephone and the availability of in-person appointments. ? ?History of present illness: ? ?61 y.o. male w PMHx significant for AKI after he presented to ER w fatigue in 01/2021. 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  ?  ?He was seen by Urology, Dr Abner Greenspan, in evaluation for his small renal CA then referred to me for a minimally invasive treatment option. The Pt underwent L renal mass microwave ablation by me on 05/15/21 and was discharged uneventfully after overnight observation. He is seen today in routine follow up 3 months post procedure. He reports to be at baseline.  No abdominal pain, flank pain or urinary tract symptoms. ? ?Review of Systems: A 12-point ROS discussed, and pertinent positives are indicated in the HPI above.  All other systems are negative. ? ? ?Past Medical History:  ?Diagnosis Date  ? Anginal pain ([place])   ? Chest pain 02/13/2012  ? echo - EF >55%;mod concentric L ventricular hypertrophy; atrial septum aneurysmal; patent foramen ovale suspected on color flow, LA mil/mod dilation; RV systolic pressure [id number]  ? Chest pain 06/09/2007  ? echo - EF 45-50%; interarterial shunt; mild valvular regurgitation;   ? Chest pain 06/09/2007  ? Myoview - EF 53%; normal perfusion all regions;EKG negative for ischemia   ? Chronic kidney disease   ? Diabetes mellitus    ? type II  ? DIABETES MELLITUS, TYPE II 08/29/2009  ? GERD (gastroesophageal reflux disease) 08/12/2010  ? Hemorrhoids   ? History of anal fissures   ? Hyperlipidemia   ? HYPERLIPIDEMIA 08/29/2009  ? Hypertension   ? HYPERTENSION 01/14/2008  ? Lightheadedness 05/10/2002  ? 30d monitor unremarkable  ? LVH (left ventricular hypertrophy)   ? with systolic dysfunction by echo 10/03, EF 45-50% - Dr. Melvern Banker  ? Multinodular goiter 12/06/2015  ? Nonspecific ST-T wave electrocardiographic changes 08/11/2006  ? Myoview - EF 49%; normal perfusion all regions, EKG negative for ischemia  ? OSA (obstructive sleep apnea)   ? Preventative health care 08/11/2010  ? Sleep apnea 08/13/2008  ? AHI 1.2; sleep study 10/2007 - AHI 9.47at 15cm H2O and 31.3 at 16cm H2O  ? SLEEP APNEA, OBSTRUCTIVE 01/14/2008  ? ? ?Past Surgical History:  ?Procedure Laterality Date  ? ANAL SPHINCTEROTOMY  08/20/11  ? CARDIAC CATHETERIZATION  10/23/2008  ? normal coronary arteries and LV function  ? COLONOSCOPY    ? IR RADIOLOGIST EVAL & MGMT  04/12/2021  ? IR RADIOLOGIST EVAL & MGMT  08/13/2021  ? POLYPECTOMY    ? RADIOLOGY WITH ANESTHESIA N/A 05/15/2021  ? Procedure: MICROWAVE ABLATION;  Surgeon: Michaelle Birks, MD;  Location: [facility];  Service: Radiology;  Laterality: N/A;  ? ? ?Allergies: ?Sulfa antibiotics and Vicodin [hydrocodone-acetaminophen] ? ?Medications: ?Prior to Admission medications   ?Medication Sig Start Date End Date Taking? Authorizing Provider  ?amLODipine-benazepril (LOTREL) 10-40 MG capsule TAKE 1 CAPSULE BY MOUTH  EVERY DAY 05/03/21   Biagio Borg, MD  ?Blood Glucose Monitoring Suppl (ONE TOUCH ULTRA 2) w/Device KIT Use as directed  E11.9 12/11/16   Biagio Borg, MD  ?hydrALAZINE (APRESOLINE) 50 MG tablet Take 1 tablet (50 mg total) by mouth 3 (three) times daily. 09/11/20   Biagio Borg, MD  ?hydrochlorothiazide (HYDRODIURIL) 25 MG tablet Take 1 tablet (25 mg total) by mouth daily. 02/13/21   Biagio Borg, MD  ?Lancets MISC Use as directed E11.9  12/11/16   Biagio Borg, MD  ?metFORMIN (GLUCOPHAGE-XR) 500 MG 24 hr tablet TAKE 2 TABLETS BY MOUTH EVERY DAY WITH BREAKFAST ?Patient taking differently: Take 1,000 mg by mouth daily with breakfast. 12/24/20   Biagio Borg, MD  ?nebivolol (BYSTOLIC) 10 MG tablet TAKE 2 TABLETS (20 MG TOTAL) BY MOUTH DAILY. ?Patient taking differently: Take 20 mg by mouth daily. 09/21/20   Biagio Borg, MD  ?omeprazole (PRILOSEC) 40 MG capsule TAKE 1 CAPSULE BY MOUTH EVERY DAY 07/09/21   Biagio Borg, MD  ?potassium chloride (KLOR-CON M10) 10 MEQ tablet Take 1 tablet (10 mEq total) by mouth daily. 02/13/21   Biagio Borg, MD  ?pravastatin (PRAVACHOL) 80 MG tablet TAKE 1 TABLET BY MOUTH EVERY DAY 07/09/21   Biagio Borg, MD  ?sitaGLIPtin (JANUVIA) 100 MG tablet TAKE 1 TABLET BY MOUTH EVERY DAY 07/09/21   Biagio Borg, MD  ?  ? ?Family History  ?Problem Relation Age of Onset  ? Hypertension Mother   ? Heart disease Father   ? Colon cancer Neg Hx   ? Thyroid disease Neg Hx   ? ? ?Social History  ? ?Socioeconomic History  ? Marital status: Single  ?  Spouse name: Not on file  ? Number of children: 1  ? Years of education: Not on file  ? Highest education level: Not on file  ?Occupational History  ? Occupation: work as Administrator  ?  Employer: [employer]  ?Tobacco Use  ? Smoking status: Every Day  ?  Packs/day: 0.50  ?  Years: 30.00  ?  Pack years: 15.00  ?  Types: Cigarettes  ? Smokeless tobacco: Never  ?Vaping Use  ? Vaping Use: Never used  ?Substance and Sexual Activity  ? Alcohol use: Yes  ?  Alcohol/week: 3.0 standard drinks  ?  Types: 3 Standard drinks or equivalent per week  ?  Comment: occaisionally  ? Drug use: No  ? Sexual activity: Not on file  ?Other Topics Concern  ? Not on file  ?Social History Narrative  ? Not on file  ? ?Social Determinants of Health  ? ?Financial Resource Strain: Not on file  ?Food Insecurity: Not on file  ?Transportation Needs: Not on file  ?Physical Activity: Not on file  ?Stress: Not on file   ?Social Connections: Not on file  ? ? ? ?Vital Signs: ?There were no vitals taken for this visit. ? ?Physical Exam ? ?As part of this Telephone encounter, no in-person exam was conducted. ? ?Imaging: ? ?IR L renal mass MWA: 05/15/21 ?Adequate 1 cm L superior renal pole mass targeting and ablation. ? ?Follow up MR Abdomen, 08/08/21 ?No evidence of recurrent or residual enhancement. ? ? ? ? ?Labs: ? ?CBC: ?Recent Labs  ?  09/10/20 ?0856 02/07/21 ?0717 04/30/21 ?1330 05/16/21 ?0410  ?WBC 6.9 5.8 7.2 10.6*  ?HGB 13.8 12.7* 12.4* 13.0  ?HCT 40.5 36.3* 36.4* 37.6*  ?PLT 258.0 241 221 220  ? ? ?  COAGS: ?Recent Labs  ?  05/15/21 ?1015  ?INR 1.0  ? ? ?BMP: ?Recent Labs  ?  02/07/21 ?0717 02/07/21 ?1305 02/13/21 ?1624 04/30/21 ?1330 05/15/21 ?1015 05/16/21 ?0410  ?NA 140  --  144 141 138 135  ?K 2.7*   < > 3.2* 3.8 3.1* 3.3*  ?CL 105  --  104 104 100 100  ?CO2 24  --  _0 ?GLUCOSE 121*  --  91 97 104* 198*  ?BUN 22  --  17 25* 19 29*  ?CALCIUM 9.4  --  9.1 9.3 9.7 8.9  ?CREATININE 1.50*  --  1.38 1.42* 1.26* 1.84*  ?GFRNONAA 53*  --   --  56* >60 41*  ? < > = values in this interval not displayed.  ? ? ?LIVER FUNCTION TESTS: ?Recent Labs  ?  09/10/20 ?0856 02/05/21 ?1624 02/07/21 ?0717  ?BILITOT 0.7 0.5 1.0  ?AST _1 ?ALT _2 ?ALKPHOS 70 59 57  ?PROT 7.2 7.3 6.6  ?ALBUMIN 4.5 4.7 3.9  ? ? ?Assessment and Plan: ? ?61 y/o M p/w small, 1 cm, L upper pole renal mass c/w RCC, now s/p MWA on 05/15/21. ? ?*Pt back to baseline. No post procedural concerns. ?*3 mos follow up MR Abdomen (08/08/21) obtained and reviewed. No evidence of recurrent or residual enhancement. ?*Will see him back for with surveillance MRI in 6 mos from today. ? 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  ?Pt will follow up with his Urologist, Dr. Rexene Alberts. ? ? ?Thank you for allowing [place] to participate in the care of your Patient. ?Please contact me with questions, concerns, or if new  issues arise. ? ?Electronically Signed: ? ?Michaelle Birks, MD ?[facility] ?[facility] ? ? ?Pager. [phone] ?Clinic. [phone] ? ?I spent a total of

## 2021-09-11 ENCOUNTER — Ambulatory Visit
Admission: RE | Admit: 2021-09-11 | Discharge: 2021-09-11 | Disposition: A | Discharge status: Home / Self Care | Payer: 59 | Source: Ambulatory Visit | Attending: Urology | Admitting: Urology

## 2021-09-11 ENCOUNTER — Encounter: Payer: Self-pay | Admitting: *Deleted

## 2021-09-11 DIAGNOSIS — N2889 Other specified disorders of kidney and ureter: Secondary | ICD-10-CM

## 2021-09-11 HISTORY — PX: IR RADIOLOGIST EVAL & MGMT: IMG5224

## 2021-09-11 NOTE — Consult Note (Addendum)
Visit Reason: Patient was seen in consultation today for Perry renal mass evaluation at the request of [name]  Care Team: Jonathan Lima MD; Urology Jonathan Borg, MD; PCP  Virtual Visit via Telephone Note   I connected with [name]  at 8:00 AM EST by telephone and verified that I am speaking with the correct person using two identifiers.   I discussed the limitations, risks, security and privacy concerns of performing an evaluation and management service by telephone and the availability of in-person appointments.  History of Present Illness:  [name] is a 61 y.o. male well known to me s/p prior ablation of L renal mass c/w papillary RCC. Pt had originally presented to ER w fatigue in 01/2021 and was found to have an AKI. 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 An incidental, contralateral 1 cm left upper pole mass had imaging characteristics consistent with aforementioned papillary RCC. He underwent L renal mass microwave ablation by me on 05/15/21 and was discharged uneventfully after overnight observation. He was seen in follow up 3 months post procedure and was back to baseline health.    The Perry renal mass was upgraded on his follow up MRI, from Bosniak IIF to III. He is followed closely by Urology, Dr [name], and was re-referred to me for a minimally invasive treatment option of this lesion.  Review of Systems: A 12-point ROS discussed, and pertinent positives are indicated in the HPI above.  All other systems are negative.   Past Medical History:  Diagnosis Date   Anginal pain ([place])    Chest pain 02/13/2012   echo - EF >55%;mod concentric L ventricular hypertrophy; atrial septum aneurysmal; patent foramen ovale suspected on color flow, LA mil/mod dilation; RV systolic pressure [id number]   Chest pain 06/09/2007   echo -  EF 45-50%; interarterial shunt; mild valvular regurgitation;    Chest pain 06/09/2007   Myoview - EF 53%; normal perfusion all regions;EKG negative for ischemia    Chronic kidney disease    Diabetes mellitus    type II   DIABETES MELLITUS, TYPE II 08/29/2009   GERD (gastroesophageal reflux disease) 08/12/2010   Hemorrhoids    History of anal fissures    Hyperlipidemia    HYPERLIPIDEMIA 08/29/2009   Hypertension    HYPERTENSION 01/14/2008   Lightheadedness 05/10/2002   30d monitor unremarkable   LVH (left ventricular hypertrophy)    with systolic dysfunction by echo 10/03, EF 45-50% - [name]   Multinodular goiter 12/06/2015   Nonspecific ST-T wave electrocardiographic changes 08/11/2006   Myoview - EF 49%; normal perfusion all regions, EKG negative for ischemia   OSA (obstructive sleep apnea)    Preventative health care 08/11/2010   Sleep apnea 08/13/2008   AHI 1.2; sleep study 10/2007 - AHI 9.47at 15cm H2O and 31.3 at [facility], OBSTRUCTIVE 01/14/2008    Past Surgical History:  Procedure Laterality Date   ANAL SPHINCTEROTOMY  08/20/11   CARDIAC CATHETERIZATION  10/23/2008   normal coronary arteries and LV function   COLONOSCOPY     IR RADIOLOGIST EVAL & MGMT  04/12/2021   IR RADIOLOGIST EVAL & MGMT  08/13/2021   POLYPECTOMY     RADIOLOGY WITH ANESTHESIA N/A 05/15/2021   Procedure: MICROWAVE ABLATION;  Surgeon: Jonathan Birks, MD;  Location: [facility];  Service: Radiology;  Laterality:  N/A;    Allergies: Sulfa antibiotics and Vicodin [hydrocodone-acetaminophen]  Medications: Prior to Admission medications   Medication Sig Start Date End Date Taking? Authorizing Provider  amLODipine-benazepril (LOTREL) 10-40 MG capsule TAKE 1 CAPSULE BY MOUTH EVERY DAY 05/03/21   Jonathan Borg, MD  Blood Glucose Monitoring Suppl (ONE TOUCH ULTRA 2) w/Device KIT Use as directed  E11.9 12/11/16   Jonathan Borg, MD  hydrALAZINE (APRESOLINE) 50 MG tablet Take 1 tablet (50 mg total) by  mouth 3 (three) times daily. 09/11/20   Jonathan Borg, MD  hydrochlorothiazide (HYDRODIURIL) 25 MG tablet Take 1 tablet (25 mg total) by mouth daily. 02/13/21   Jonathan Borg, MD  Lancets MISC Use as directed E11.9 12/11/16   Jonathan Borg, MD  metFORMIN (GLUCOPHAGE-XR) 500 MG 24 hr tablet TAKE 2 TABLETS BY MOUTH EVERY DAY WITH BREAKFAST Patient taking differently: Take 1,000 mg by mouth daily with breakfast. 12/24/20   Jonathan Borg, MD  nebivolol (BYSTOLIC) 10 MG tablet TAKE 2 TABLETS (20 MG TOTAL) BY MOUTH DAILY. Patient taking differently: Take 20 mg by mouth daily. 09/21/20   Jonathan Borg, MD  omeprazole (PRILOSEC) 40 MG capsule TAKE 1 CAPSULE BY MOUTH EVERY DAY 07/09/21   Jonathan Borg, MD  potassium chloride (KLOR-CON M10) 10 MEQ tablet Take 1 tablet (10 mEq total) by mouth daily. 02/13/21   Jonathan Borg, MD  pravastatin (PRAVACHOL) 80 MG tablet TAKE 1 TABLET BY MOUTH EVERY DAY 07/09/21   Jonathan Borg, MD  sitaGLIPtin (JANUVIA) 100 MG tablet TAKE 1 TABLET BY MOUTH EVERY DAY 07/09/21   Jonathan Borg, MD     Family History  Problem Relation Age of Onset   Hypertension Mother    Heart disease Father    Colon cancer Neg Hx    Thyroid disease Neg Hx     Social History   Socioeconomic History   Marital status: Single    Spouse name: Not on file   Number of children: 1   Years of education: Not on file   Highest education level: Not on file  Occupational History   Occupation: work as truck Education administrator: [employer]  Tobacco Use   Smoking status: Every Day    Packs/day: 0.50    Years: 30.00    Total pack years: 15.00    Types: Cigarettes   Smokeless tobacco: Never  Vaping Use   Vaping Use: Never used  Substance and Sexual Activity   Alcohol use: Yes    Alcohol/week: 3.0 standard drinks of alcohol    Types: 3 Standard drinks or equivalent per week    Comment: occaisionally   Drug use: No   Sexual activity: Not on file  Other Topics Concern   Not on file  Social  History Narrative   Not on file   Social Determinants of Health   Financial Resource Strain: Not on file  Food Insecurity: Not on file  Transportation Needs: Not on file  Physical Activity: Not on file  Stress: Not on file  Social Connections: Not on file    ECOG Status: 0 - Asymptomatic   Review of Systems As above  Vital Signs: There were no vitals taken for this visit.  Physical Exam Deferred, secondary to virtual visit.   Imaging:  MR Abdomen: 08/08/21 and 03/06/21 Imaging independely reviewed, demonstrating a multi-septate Perry renal mass. Measures up to 2.1 cm.    Labs:  CBC: Recent Labs  02/07/21 0717 04/30/21 1330 05/16/21 0410  WBC 5.8 7.2 10.6*  HGB 12.7* 12.4* 13.0  HCT 36.3* 36.4* 37.6*  PLT 241 221 220    COAGS: Recent Labs    05/15/21 1015  INR 1.0    BMP: Recent Labs    02/07/21 0717 02/07/21 1305 02/13/21 1624 04/30/21 1330 05/15/21 1015 05/16/21 0410  NA 140  --  144 141 138 135  K 2.7*   < > 3.2* 3.8 3.1* 3.3*  CL 105  --  104 104 100 100  CO2 24  --  _0 GLUCOSE 121*  --  91 97 104* 198*  BUN 22  --  17 25* 19 29*  CALCIUM 9.4  --  9.1 9.3 9.7 8.9  CREATININE 1.50*  --  1.38 1.42* 1.26* 1.84*  GFRNONAA 53*  --   --  56* >60 41*   < > = values in this interval not displayed.    LIVER FUNCTION TESTS: Recent Labs    02/05/21 1624 02/07/21 0717  BILITOT 0.5 1.0  AST 17 18  ALT 11 11  ALKPHOS 59 57  PROT 7.3 6.6  ALBUMIN 4.7 3.9    Assessment and Plan:  61 y/o M well known to me s/p MWA of a small L renal pRCC on 05/15/21. Pt referred for evaluation and management of an indeterminate 2.1 cm cystic RIGHT renal mass, Bosniak III.  He is interested in pursuing a minimally-invasive option for the treatment of this suspicious renal mass at this time, and is curious about RIGHT RENAL MASS CRYOABLATION.  Review of systems is otherwise negative.   The procedure has been fully reviewed with the patient/patient's  authorized representative and they have consented to the procedure. The risks, benefits and alternatives have been explained including, but not limited to, failure to treat entire lesion, bleeding, infection, damage to adjacent structures, hematuria, urine leak, decrease in renal function or post procedural neuropathy.   *MRI (08/08/21) already performed and reviewed. No additional imaging required. *will request General Anesthesia support. *Proceed to schedule based on mutual availability. *Repeat Labs on or before day of procedure (CBC, CMP, Coags) *Overnight admission, expected LOS 1 day.  Thank you for this referral.  I greatly enjoyed meeting [name] and look forward to participating in their care.  A copy of this report was sent to the requesting provider on this date.  Electronically Signed:  Michaelle Birks, MD [facility]   Pager. [phone] Clinic. [phone]  I spent a total of  40 Minutes of non-face-to-face time in clinical consultation, greater than 50% of which was counseling/coordinating care for [name] evaluation for RIGHT renal mass treatment.

## 2021-09-26 ENCOUNTER — Other Ambulatory Visit: Payer: Self-pay | Admitting: Internal Medicine

## 2021-09-26 MED ORDER — HYDRALAZINE HCL 50 MG PO TABS: 50.0000 mg | ORAL_TABLET | Freq: Three times a day (TID) | ORAL | 1 refills | Status: DC

## 2021-09-26 NOTE — Telephone Encounter (Signed)
Please refill as per office routine med refill policy (all routine meds to be refilled for 3 mo or monthly (per pt preference) up to one year from last visit, then month to month grace period for 3 mo, then further med refills will have to be denied) ? ?

## 2021-09-26 NOTE — Addendum Note (Signed)
Addended by: Biagio Borg on: 09/26/2021 10:55 AM   Modules accepted: Orders

## 2021-09-27 ENCOUNTER — Other Ambulatory Visit (HOSPITAL_COMMUNITY): Payer: Self-pay | Admitting: Interventional Radiology

## 2021-09-27 DIAGNOSIS — N2889 Other specified disorders of kidney and ureter: Secondary | ICD-10-CM

## 2021-10-22 ENCOUNTER — Other Ambulatory Visit: Payer: Self-pay | Admitting: Radiology

## 2021-10-22 ENCOUNTER — Other Ambulatory Visit: Payer: Self-pay | Admitting: Urology

## 2021-10-22 DIAGNOSIS — D49511 Neoplasm of unspecified behavior of right kidney: Secondary | ICD-10-CM

## 2021-10-22 DIAGNOSIS — N2889 Other specified disorders of kidney and ureter: Secondary | ICD-10-CM

## 2021-10-22 NOTE — Progress Notes (Signed)
Called the IR PA line to request preop orders for surgery for this patient.   10/22/21 1520  Preop Orders  Has preop orders? No  Name of staff/physician contacted for orders(Indicate phone or IB message) Lennette Bihari, PA-C.

## 2021-10-25 NOTE — Progress Notes (Addendum)
COVID Vaccine Completed: yes x2  Date of COVID positive in last 90 days: no  PCP - Oliver Barre, MD Cardiologist - Nanetta Batty, MD  Chest x-ray - 02/07/21 Epic EKG - 02/08/21 Epic Stress Test - 2009 ECHO - 2013 Cardiac Cath - 2010 Pacemaker/ICD device last checked: n/a Spinal Cord Stimulator: n/a  Bowel Prep - no per pt  Sleep Study - yes, positive CPAP - no  Fasting Blood Sugar - 89 Checks Blood Sugar once a week  Blood Thinner Instructions: n/a Aspirin Instructions: Last Dose:  Activity level: Can go up a flight of stairs and perform activities of daily living without stopping and without symptoms of chest pain or shortness of breath.    Anesthesia review: CAD, HTN, DM2, OSA, dysphagia, goiter  Patient denies shortness of breath, fever, cough and chest pain at PAT appointment  Patient verbalized understanding of instructions that were given to them at the PAT appointment. Patient was also instructed that they will need to review over the PAT instructions again at home before surgery.

## 2021-10-25 NOTE — Patient Instructions (Signed)
SURGICAL WAITING ROOM VISITATION Patients having surgery or a procedure may have no more than 2 support people in the waiting area - these visitors may rotate.   Children under the age of 37 must have an adult with them who is not the patient. If the patient needs to stay at the hospital during part of their recovery, the visitor guidelines for inpatient rooms apply. Pre-op nurse will coordinate an appropriate time for 1 support person to accompany patient in pre-op.  This support person may not rotate.    Please refer to the [facility] website for the visitor guidelines for Inpatients (after your surgery is over and you are in a regular room).    Your procedure is scheduled on: 11/06/21   Report to [facility] Main Entrance    Report to admitting at 10:00 AM   Call this number if you have problems the morning of surgery [phone]   Do not eat food :After Midnight.   After Midnight you may have the following liquids until 8:45 AM DAY OF SURGERY  Water Non-Citrus Juices (without pulp, NO RED) Carbonated Beverages Black Coffee (NO MILK/CREAM OR CREAMERS, sugar ok)  Clear Tea (NO MILK/CREAM OR CREAMERS, sugar ok) regular and decaf                             Plain Jell-O (NO RED)                                           Fruit ices (not with fruit pulp, NO RED)                                     Popsicles (NO RED)                                                               Sports drinks like Gatorade (NO RED)  FOLLOW BOWEL PREP AND ANY ADDITIONAL PRE OP INSTRUCTIONS YOU RECEIVED FROM YOUR SURGEON'S OFFICE!!!     Oral Hygiene is also important to reduce your risk of infection.                                    Remember - BRUSH YOUR TEETH THE MORNING OF SURGERY WITH YOUR REGULAR TOOTHPASTE   Do NOT smoke after Midnight   Take these medicines the morning of surgery with A SIP OF WATER: Hydralazine, Nebivolol, Omeprazole, Pravastatin  DO NOT TAKE ANY ORAL DIABETIC  MEDICATIONS DAY OF YOUR SURGERY  How to Manage Your Diabetes Before and After Surgery  Why is it important to control my blood sugar before and after surgery? Improving blood sugar levels before and after surgery helps healing and can limit problems. A way of improving blood sugar control is eating a healthy diet by:  Eating less sugar and carbohydrates  Increasing activity/exercise  Talking with your doctor about reaching your blood sugar goals High blood sugars (greater than 180 mg/dL) can raise your risk  of infections and slow your recovery, so you will need to focus on controlling your diabetes during the weeks before surgery. Make sure that the doctor who takes care of your diabetes knows about your planned surgery including the date and location.  How do I manage my blood sugar before surgery? Check your blood sugar at least 4 times a day, starting 2 days before surgery, to make sure that the level is not too high or low. Check your blood sugar the morning of your surgery when you wake up and every 2 hours until you get to the Short Stay unit. If your blood sugar is less than 70 mg/dL, you will need to treat for low blood sugar: Do not take insulin. Treat a low blood sugar (less than 70 mg/dL) with  cup of clear juice (cranberry or apple), 4 glucose tablets, OR glucose gel. Recheck blood sugar in 15 minutes after treatment (to make sure it is greater than 70 mg/dL). If your blood sugar is not greater than 70 mg/dL on recheck, call [phone] for further instructions. Report your blood sugar to the short stay nurse when you get to Short Stay.  If you are admitted to the hospital after surgery: Your blood sugar will be checked by the staff and you will probably be given insulin after surgery (instead of oral diabetes medicines) to make sure you have good blood sugar levels. The goal for blood sugar control after surgery is 80-180 mg/dL.   WHAT DO I DO ABOUT MY DIABETES  MEDICATION?  Do not take oral diabetes medicines (pills) the morning of surgery.  THE DAY BEFORE SURGERY, take Metformin and Januvia as prescribed       THE MORNING OF SURGERY, do not take Metformin or Januvia  The day of surgery, do not take other diabetes injectables, including Byetta (exenatide), Bydureon (exenatide ER), Victoza (liraglutide), or Trulicity (dulaglutide).  If your CBG is greater than 220 mg/dL, you may take  of your sliding scale  (correction) dose of insulin.  Reviewed and Endorsed by [facility] Patient Education Committee, August 2015   Bring CPAP mask and tubing day of surgery.                              You may not have any metal on your body including jewelry, and body piercing             Do not wear lotions, powders, cologne, or deodorant              Men may shave face and neck.   Do not bring valuables to the hospital. [place].   Contacts, dentures or bridgework may not be worn into surgery.   Bring small overnight bag day of surgery.   DO NOT [place]. PHARMACY WILL DISPENSE MEDICATIONS LISTED ON YOUR MEDICATION LIST TO YOU DURING YOUR ADMISSION [place]!    Special Instructions: Bring a copy of your healthcare power of attorney and living will documents         the day of surgery if you haven't scanned them before.              Please read over the following fact sheets you were given: IF YOU HAVE QUESTIONS ABOUT YOUR PRE-OP INSTRUCTIONS PLEASE CALL [phone]- Apolonio Schneiders  [place] - Preparing for Surgery Before surgery, you can play an important role.  Because skin is not sterile, your skin needs to be as free of germs as possible.  You can reduce the number of germs on your skin by washing with CHG (chlorahexidine gluconate) soap before surgery.  CHG is an antiseptic cleaner which kills germs and bonds with the skin to continue killing germs even  after washing. Please DO NOT use if you have an allergy to CHG or antibacterial soaps.  If your skin becomes reddened/irritated stop using the CHG and inform your nurse when you arrive at Short Stay. Do not shave (including legs and underarms) for at least 48 hours prior to the first CHG shower.  You may shave your face/neck.  Please follow these instructions carefully:  1.  Shower with CHG Soap the night before surgery and the  morning of surgery.  2.  If you choose to wash your hair, wash your hair first as usual with your normal  shampoo.  3.  After you shampoo, rinse your hair and body thoroughly to remove the shampoo.                             4.  Use CHG as you would any other liquid soap.  You can apply chg directly to the skin and wash.  Gently with a scrungie or clean washcloth.  5.  Apply the CHG Soap to your body ONLY FROM THE NECK DOWN.   Do   not use on face/ open                           Wound or open sores. Avoid contact with eyes, ears mouth and   genitals (private parts).                       Wash face,  Genitals (private parts) with your normal soap.             6.  Wash thoroughly, paying special attention to the area where your    surgery  will be performed.  7.  Thoroughly rinse your body with warm water from the neck down.  8.  DO NOT shower/wash with your normal soap after using and rinsing off the CHG Soap.                9.  Pat yourself dry with a clean towel.            10.  Wear clean pajamas.            11.  Place clean sheets on your bed the night of your first shower and do not  sleep with pets. Day of Surgery : Do not apply any lotions/deodorants the morning of surgery.  Please wear clean clothes to the hospital/surgery center.  FAILURE TO FOLLOW THESE INSTRUCTIONS MAY RESULT IN THE CANCELLATION OF YOUR SURGERY  PATIENT SIGNATURE_________________________________  NURSE  SIGNATURE__________________________________  ________________________________________________________________________  WHAT IS A BLOOD TRANSFUSION? Blood Transfusion Information  A transfusion is the replacement of blood or some of its parts. Blood is made up of multiple cells which provide different functions. Red blood cells carry oxygen and are used for blood loss replacement. White blood cells fight against infection. Platelets control bleeding. Plasma helps clot blood. Other blood products are available for specialized needs, such as hemophilia or other  clotting disorders. BEFORE THE TRANSFUSION  Who gives blood for transfusions?  Healthy volunteers who are fully evaluated to make sure their blood is safe. This is blood bank blood. Transfusion therapy is the safest it has ever been in the practice of medicine. Before blood is taken from a donor, a complete history is taken to make sure that person has no history of diseases nor engages in risky social behavior (examples are intravenous drug use or sexual activity with multiple partners). The donor's travel history is screened to minimize risk of transmitting infections, such as malaria. The donated blood is tested for signs of infectious diseases, such as HIV and hepatitis. The blood is then tested to be sure it is compatible with you in order to minimize the chance of a transfusion reaction. If you or a relative donates blood, this is often done in anticipation of surgery and is not appropriate for emergency situations. It takes many days to process the donated blood. RISKS AND COMPLICATIONS Although transfusion therapy is very safe and saves many lives, the main dangers of transfusion include:  Getting an infectious disease. Developing a transfusion reaction. This is an allergic reaction to something in the blood you were given. Every precaution is taken to prevent this. The decision to have a blood transfusion has been considered carefully  by your caregiver before blood is given. Blood is not given unless the benefits outweigh the risks. AFTER THE TRANSFUSION Right after receiving a blood transfusion, you will usually feel much better and more energetic. This is especially true if your red blood cells have gotten low (anemic). The transfusion raises the level of the red blood cells which carry oxygen, and this usually causes an energy increase. The nurse administering the transfusion will monitor you carefully for complications. HOME CARE INSTRUCTIONS  No special instructions are needed after a transfusion. You may find your energy is better. Speak with your caregiver about any limitations on activity for underlying diseases you may have. SEEK MEDICAL CARE IF:  Your condition is not improving after your transfusion. You develop redness or irritation at the intravenous (IV) site. SEEK IMMEDIATE MEDICAL CARE IF:  Any of the following symptoms occur over the next 12 hours: Shaking chills. You have a temperature by mouth above 102 F (38.9 C), not controlled by medicine. Chest, back, or muscle pain. People around you feel you are not acting correctly or are confused. Shortness of breath or difficulty breathing. Dizziness and fainting. You get a rash or develop hives. You have a decrease in urine output. Your urine turns a dark color or changes to pink, red, or brown. Any of the following symptoms occur over the next 10 days: You have a temperature by mouth above 102 F (38.9 C), not controlled by medicine. Shortness of breath. Weakness after normal activity. The white part of the eye turns yellow (jaundice). You have a decrease in the amount of urine or are urinating less often. Your urine turns a dark color or changes to pink, red, or brown. Document Released: 03/14/2000 Document Revised: 06/09/2011 Document Reviewed: 11/01/2007 [facility] Patient Information 2014 [place],  [place].  _______________________________________________________________________

## 2021-10-29 ENCOUNTER — Encounter (HOSPITAL_COMMUNITY): Payer: Self-pay

## 2021-10-29 ENCOUNTER — Encounter (HOSPITAL_COMMUNITY)
Admission: RE | Admit: 2021-10-29 | Discharge: 2021-10-29 | Disposition: A | Discharge status: Home / Self Care | Payer: 59 | Source: Ambulatory Visit | Attending: Interventional Radiology | Admitting: Interventional Radiology

## 2021-10-29 VITALS — BP 114/80 | HR 60 | Temp 98.5°F | Resp 14 | Ht 73.0 in | Wt 170.0 lb

## 2021-10-29 DIAGNOSIS — E0800 Diabetes mellitus due to underlying condition with hyperosmolarity without nonketotic hyperglycemic-hyperosmolar coma (NKHHC): Secondary | ICD-10-CM | POA: Diagnosis not present

## 2021-10-29 DIAGNOSIS — Z794 Long term (current) use of insulin: Secondary | ICD-10-CM | POA: Diagnosis not present

## 2021-10-29 DIAGNOSIS — Z01818 Encounter for other preprocedural examination: Secondary | ICD-10-CM | POA: Insufficient documentation

## 2021-10-29 DIAGNOSIS — N2889 Other specified disorders of kidney and ureter: Secondary | ICD-10-CM | POA: Insufficient documentation

## 2021-10-29 LAB — CBC WITH DIFFERENTIAL/PLATELET
Abs Immature Granulocytes: 0.01 10*3/uL (ref 0.00–0.07)
Basophils Absolute: 0.1 10*3/uL (ref 0.0–0.1)
Basophils Relative: 1 %
Eosinophils Absolute: 0.2 10*3/uL (ref 0.0–0.5)
Eosinophils Relative: 5 %
HCT: 37.9 % — ABNORMAL LOW (ref 39.0–52.0)
Hemoglobin: 13.1 g/dL (ref 13.0–17.0)
Immature Granulocytes: 0 %
Lymphocytes Relative: 33 %
Lymphs Abs: 1.7 10*3/uL (ref 0.7–4.0)
MCH: 32.8 pg (ref 26.0–34.0)
MCHC: 34.6 g/dL (ref 30.0–36.0)
MCV: 95 fL (ref 80.0–100.0)
Monocytes Absolute: 0.8 10*3/uL (ref 0.1–1.0)
Monocytes Relative: 17 %
Neutro Abs: 2.3 10*3/uL (ref 1.7–7.7)
Neutrophils Relative %: 44 %
Platelets: 235 10*3/uL (ref 150–400)
RBC: 3.99 MIL/uL — ABNORMAL LOW (ref 4.22–5.81)
RDW: 13.1 % (ref 11.5–15.5)
WBC: 5.1 10*3/uL (ref 4.0–10.5)
nRBC: 0 % (ref 0.0–0.2)

## 2021-10-29 LAB — PROTIME-INR
INR: 1 (ref 0.8–1.2)
Prothrombin Time: 13.2 seconds (ref 11.4–15.2)

## 2021-10-29 LAB — BASIC METABOLIC PANEL
Anion gap: 10 (ref 5–15)
BUN: 18 mg/dL (ref 8–23)
CO2: 28 mmol/L (ref 22–32)
Calcium: 9.3 mg/dL (ref 8.9–10.3)
Chloride: 105 mmol/L (ref 98–111)
Creatinine, Ser: 1.38 mg/dL — ABNORMAL HIGH (ref 0.61–1.24)
GFR, Estimated: 58 mL/min — ABNORMAL LOW (ref >60)
Glucose, Bld: 91 mg/dL (ref 70–99)
Potassium: 3.3 mmol/L — ABNORMAL LOW (ref 3.5–5.1)
Sodium: 143 mmol/L (ref 135–145)

## 2021-10-29 LAB — HEMOGLOBIN A1C
Hgb A1c MFr Bld: 5.7 % — ABNORMAL HIGH (ref 4.8–5.6)
Mean Plasma Glucose: 116.89 mg/dL

## 2021-10-29 LAB — GLUCOSE, CAPILLARY: Glucose-Capillary: 104 mg/dL — ABNORMAL HIGH (ref 70–99)

## 2021-11-01 ENCOUNTER — Other Ambulatory Visit: Payer: Self-pay | Admitting: Internal Medicine

## 2021-11-01 NOTE — Telephone Encounter (Signed)
Please refill as per office routine med refill policy (all routine meds to be refilled for 3 mo or monthly (per pt preference) up to one year from last visit, then month to month grace period for 3 mo, then further med refills will have to be denied) ? ?

## 2021-11-04 ENCOUNTER — Other Ambulatory Visit: Payer: Self-pay | Admitting: Internal Medicine

## 2021-11-04 NOTE — Telephone Encounter (Signed)
Please refill as per office routine med refill policy (all routine meds to be refilled for 3 mo or monthly (per pt preference) up to one year from last visit, then month to month grace period for 3 mo, then further med refills will have to be denied) ? ?

## 2021-11-05 ENCOUNTER — Other Ambulatory Visit: Payer: Self-pay | Admitting: Student

## 2021-11-06 ENCOUNTER — Encounter (HOSPITAL_COMMUNITY): Payer: Self-pay | Admitting: Interventional Radiology

## 2021-11-06 ENCOUNTER — Encounter (HOSPITAL_COMMUNITY)
Admission: RE | Disposition: A | Discharge status: Home / Self Care | Payer: Self-pay | Source: Home / Self Care | Attending: Interventional Radiology

## 2021-11-06 ENCOUNTER — Other Ambulatory Visit: Payer: Self-pay

## 2021-11-06 ENCOUNTER — Observation Stay (HOSPITAL_COMMUNITY)
Admission: RE | Admit: 2021-11-06 | Discharge: 2021-11-07 | Disposition: A | Discharge status: Home / Self Care | Payer: 59 | Attending: Interventional Radiology | Admitting: Interventional Radiology

## 2021-11-06 ENCOUNTER — Encounter (HOSPITAL_COMMUNITY): Payer: Self-pay

## 2021-11-06 ENCOUNTER — Ambulatory Visit (HOSPITAL_BASED_OUTPATIENT_CLINIC_OR_DEPARTMENT_OTHER): Payer: 59 | Admitting: Physician Assistant

## 2021-11-06 ENCOUNTER — Ambulatory Visit (HOSPITAL_COMMUNITY): Payer: 59 | Admitting: Physician Assistant

## 2021-11-06 ENCOUNTER — Observation Stay (HOSPITAL_COMMUNITY)
Admission: RE | Admit: 2021-11-06 | Discharge: 2021-11-06 | Disposition: A | Discharge status: Home / Self Care | Payer: 59 | Source: Ambulatory Visit | Attending: Interventional Radiology | Admitting: Interventional Radiology

## 2021-11-06 ENCOUNTER — Ambulatory Visit (HOSPITAL_COMMUNITY): Payer: 59

## 2021-11-06 DIAGNOSIS — F17210 Nicotine dependence, cigarettes, uncomplicated: Secondary | ICD-10-CM | POA: Insufficient documentation

## 2021-11-06 DIAGNOSIS — E1122 Type 2 diabetes mellitus with diabetic chronic kidney disease: Secondary | ICD-10-CM | POA: Diagnosis not present

## 2021-11-06 DIAGNOSIS — Z7984 Long term (current) use of oral hypoglycemic drugs: Secondary | ICD-10-CM | POA: Diagnosis not present

## 2021-11-06 DIAGNOSIS — Z79899 Other long term (current) drug therapy: Secondary | ICD-10-CM | POA: Insufficient documentation

## 2021-11-06 DIAGNOSIS — I110 Hypertensive heart disease with heart failure: Secondary | ICD-10-CM | POA: Diagnosis not present

## 2021-11-06 DIAGNOSIS — N189 Chronic kidney disease, unspecified: Secondary | ICD-10-CM | POA: Diagnosis not present

## 2021-11-06 DIAGNOSIS — N2889 Other specified disorders of kidney and ureter: Secondary | ICD-10-CM | POA: Diagnosis not present

## 2021-11-06 DIAGNOSIS — I509 Heart failure, unspecified: Secondary | ICD-10-CM | POA: Diagnosis not present

## 2021-11-06 DIAGNOSIS — C641 Malignant neoplasm of right kidney, except renal pelvis: Principal | ICD-10-CM | POA: Insufficient documentation

## 2021-11-06 DIAGNOSIS — K219 Gastro-esophageal reflux disease without esophagitis: Secondary | ICD-10-CM | POA: Diagnosis not present

## 2021-11-06 DIAGNOSIS — Z794 Long term (current) use of insulin: Secondary | ICD-10-CM

## 2021-11-06 DIAGNOSIS — E785 Hyperlipidemia, unspecified: Secondary | ICD-10-CM | POA: Insufficient documentation

## 2021-11-06 DIAGNOSIS — I129 Hypertensive chronic kidney disease with stage 1 through stage 4 chronic kidney disease, or unspecified chronic kidney disease: Secondary | ICD-10-CM | POA: Insufficient documentation

## 2021-11-06 HISTORY — PX: RADIOLOGY WITH ANESTHESIA: SHX6223

## 2021-11-06 LAB — GLUCOSE, CAPILLARY
Glucose-Capillary: 100 mg/dL — ABNORMAL HIGH (ref 70–99)
Glucose-Capillary: 121 mg/dL — ABNORMAL HIGH (ref 70–99)

## 2021-11-06 LAB — TYPE AND SCREEN
ABO/RH(D): O POS
Antibody Screen: NEGATIVE

## 2021-11-06 LAB — ABO/RH: ABO/RH(D): O POS

## 2021-11-06 SURGERY — IR WITH ANESTHESIA
Anesthesia: General | Laterality: Right

## 2021-11-06 MED ORDER — ROCURONIUM BROMIDE 10 MG/ML (PF) SYRINGE
PREFILLED_SYRINGE | INTRAVENOUS | Status: DC | PRN
  Administered 2021-11-06: 20 mg via INTRAVENOUS
  Administered 2021-11-06: 30 mg via INTRAVENOUS
  Administered 2021-11-06: 80 mg via INTRAVENOUS

## 2021-11-06 MED ORDER — LACTATED RINGERS IV SOLN: INTRAVENOUS | Status: DC

## 2021-11-06 MED ORDER — OXYCODONE HCL 5 MG/5ML PO SOLN: 5.0000 mg | Freq: Once | ORAL | Status: DC | PRN

## 2021-11-06 MED ORDER — HYDROMORPHONE HCL 2 MG/ML IJ SOLN
INTRAMUSCULAR | Status: AC
  Filled 2021-11-06: qty 1

## 2021-11-06 MED ORDER — ACETAMINOPHEN 10 MG/ML IV SOLN
INTRAVENOUS | Status: AC
  Filled 2021-11-06: qty 100

## 2021-11-06 MED ORDER — SUGAMMADEX SODIUM 200 MG/2ML IV SOLN
INTRAVENOUS | Status: DC | PRN
  Administered 2021-11-06: 400 mg via INTRAVENOUS

## 2021-11-06 MED ORDER — METFORMIN HCL ER 500 MG PO TB24
1000.0000 mg | ORAL_TABLET | Freq: Every day | ORAL | Status: DC
  Administered 2021-11-07: 1000 mg via ORAL
  Filled 2021-11-06: qty 2

## 2021-11-06 MED ORDER — POTASSIUM CHLORIDE CRYS ER 10 MEQ PO TBCR
10.0000 meq | EXTENDED_RELEASE_TABLET | Freq: Every day | ORAL | Status: DC
  Administered 2021-11-06 – 2021-11-07 (×2): 10 meq via ORAL
  Filled 2021-11-06 (×2): qty 1

## 2021-11-06 MED ORDER — HYDRALAZINE HCL 50 MG PO TABS
50.0000 mg | ORAL_TABLET | Freq: Three times a day (TID) | ORAL | Status: DC
  Administered 2021-11-06 – 2021-11-07 (×2): 50 mg via ORAL
  Filled 2021-11-06 (×2): qty 1

## 2021-11-06 MED ORDER — GELATIN ABSORBABLE 12-7 MM EX MISC
CUTANEOUS | Status: DC
Start: 2021-11-06 — End: 2021-11-06
  Filled 2021-11-06: qty 1

## 2021-11-06 MED ORDER — PANTOPRAZOLE SODIUM 40 MG PO TBEC
40.0000 mg | DELAYED_RELEASE_TABLET | Freq: Every day | ORAL | Status: DC
  Administered 2021-11-07: 40 mg via ORAL
  Filled 2021-11-06: qty 1

## 2021-11-06 MED ORDER — LINAGLIPTIN 5 MG PO TABS
5.0000 mg | ORAL_TABLET | Freq: Every day | ORAL | Status: DC
  Administered 2021-11-07: 5 mg via ORAL
  Filled 2021-11-06: qty 1

## 2021-11-06 MED ORDER — ONDANSETRON HCL 4 MG/2ML IJ SOLN: 4.0000 mg | Freq: Once | INTRAMUSCULAR | Status: DC | PRN

## 2021-11-06 MED ORDER — CHLORHEXIDINE GLUCONATE 0.12 % MT SOLN
15.0000 mL | Freq: Once | OROMUCOSAL | Status: AC
  Administered 2021-11-06: 15 mL via OROMUCOSAL

## 2021-11-06 MED ORDER — OXYCODONE HCL 5 MG PO TABS: 5.0000 mg | ORAL_TABLET | Freq: Once | ORAL | Status: DC | PRN

## 2021-11-06 MED ORDER — DEXAMETHASONE SODIUM PHOSPHATE 10 MG/ML IJ SOLN
INTRAMUSCULAR | Status: DC | PRN
  Administered 2021-11-06: 5 mg via INTRAVENOUS

## 2021-11-06 MED ORDER — HYDROMORPHONE HCL 1 MG/ML IJ SOLN
INTRAMUSCULAR | Status: DC | PRN
  Administered 2021-11-06: .5 mg via INTRAVENOUS

## 2021-11-06 MED ORDER — OXYCODONE HCL 5 MG PO TABS: 10.0000 mg | ORAL_TABLET | Freq: Four times a day (QID) | ORAL | Status: DC | PRN

## 2021-11-06 MED ORDER — SODIUM CHLORIDE (PF) 0.9 % IJ SOLN
INTRAMUSCULAR | Status: AC
  Filled 2021-11-06: qty 50

## 2021-11-06 MED ORDER — ONDANSETRON HCL 4 MG/2ML IJ SOLN
INTRAMUSCULAR | Status: DC | PRN
  Administered 2021-11-06: 4 mg via INTRAVENOUS

## 2021-11-06 MED ORDER — BENAZEPRIL HCL 20 MG PO TABS
40.0000 mg | ORAL_TABLET | Freq: Every day | ORAL | Status: DC
  Administered 2021-11-06 – 2021-11-07 (×2): 40 mg via ORAL
  Filled 2021-11-06 (×2): qty 2

## 2021-11-06 MED ORDER — PHENYLEPHRINE HCL-NACL 20-0.9 MG/250ML-% IV SOLN
INTRAVENOUS | Status: DC | PRN
  Administered 2021-11-06: 35 ug/min via INTRAVENOUS

## 2021-11-06 MED ORDER — FENTANYL CITRATE (PF) 100 MCG/2ML IJ SOLN
INTRAMUSCULAR | Status: DC | PRN
  Administered 2021-11-06: 100 ug via INTRAVENOUS
  Administered 2021-11-06: 50 ug via INTRAVENOUS

## 2021-11-06 MED ORDER — ONDANSETRON HCL 4 MG/2ML IJ SOLN: 4.0000 mg | Freq: Four times a day (QID) | INTRAMUSCULAR | Status: DC | PRN

## 2021-11-06 MED ORDER — ACETAMINOPHEN 10 MG/ML IV SOLN
INTRAVENOUS | Status: DC | PRN
  Administered 2021-11-06: 1000 mg via INTRAVENOUS

## 2021-11-06 MED ORDER — AMLODIPINE BESYLATE 10 MG PO TABS
10.0000 mg | ORAL_TABLET | Freq: Every day | ORAL | Status: DC
  Administered 2021-11-06 – 2021-11-07 (×2): 10 mg via ORAL
  Filled 2021-11-06 (×2): qty 1

## 2021-11-06 MED ORDER — PRAVASTATIN SODIUM 40 MG PO TABS
80.0000 mg | ORAL_TABLET | Freq: Every day | ORAL | Status: DC
  Administered 2021-11-07: 80 mg via ORAL
  Filled 2021-11-06: qty 2

## 2021-11-06 MED ORDER — NEBIVOLOL HCL 10 MG PO TABS
10.0000 mg | ORAL_TABLET | Freq: Every day | ORAL | Status: DC
  Administered 2021-11-07: 10 mg via ORAL
  Filled 2021-11-06: qty 1

## 2021-11-06 MED ORDER — SODIUM CHLORIDE 0.9 % IV SOLN
INTRAVENOUS | Status: AC
  Filled 2021-11-06: qty 500

## 2021-11-06 MED ORDER — PROPOFOL 10 MG/ML IV BOLUS
INTRAVENOUS | Status: DC | PRN
  Administered 2021-11-06: 150 mg via INTRAVENOUS

## 2021-11-06 MED ORDER — CEFAZOLIN SODIUM-DEXTROSE 2-4 GM/100ML-% IV SOLN
2.0000 g | Freq: Once | INTRAVENOUS | Status: AC
  Administered 2021-11-06: 2 g via INTRAVENOUS

## 2021-11-06 MED ORDER — LIDOCAINE 2% (20 MG/ML) 5 ML SYRINGE
INTRAMUSCULAR | Status: DC | PRN
  Administered 2021-11-06: 100 mg via INTRAVENOUS

## 2021-11-06 MED ORDER — CEFAZOLIN SODIUM-DEXTROSE 2-4 GM/100ML-% IV SOLN
INTRAVENOUS | Status: AC
  Filled 2021-11-06: qty 100

## 2021-11-06 MED ORDER — ORAL CARE MOUTH RINSE: 15.0000 mL | Freq: Once | OROMUCOSAL | Status: AC

## 2021-11-06 MED ORDER — HYDROCHLOROTHIAZIDE 25 MG PO TABS
25.0000 mg | ORAL_TABLET | Freq: Every day | ORAL | Status: DC
  Administered 2021-11-06 – 2021-11-07 (×2): 25 mg via ORAL
  Filled 2021-11-06 (×2): qty 1

## 2021-11-06 MED ORDER — FENTANYL CITRATE (PF) 250 MCG/5ML IJ SOLN
INTRAMUSCULAR | Status: AC
  Filled 2021-11-06: qty 5

## 2021-11-06 MED ORDER — DOCUSATE SODIUM 100 MG PO CAPS
100.0000 mg | ORAL_CAPSULE | Freq: Two times a day (BID) | ORAL | Status: DC
  Administered 2021-11-06 – 2021-11-07 (×2): 100 mg via ORAL
  Filled 2021-11-06 (×2): qty 1

## 2021-11-06 MED ORDER — HYDROMORPHONE HCL 1 MG/ML IJ SOLN: 0.2500 mg | INTRAMUSCULAR | Status: DC | PRN

## 2021-11-06 MED ORDER — SENNOSIDES-DOCUSATE SODIUM 8.6-50 MG PO TABS: 1.0000 | ORAL_TABLET | Freq: Every day | ORAL | Status: DC | PRN

## 2021-11-06 NOTE — H&P (Signed)
Chief Complaint: Patient was seen in consultation today for right renal mass  Referring Physician(s): Dr. Rexene Alberts  Supervising Physician: Michaelle Birks  Patient Status: [facility] - Out-pt  History of Present Illness: [name] is a 61 y.o. male with past medical history of DM, GERD, HTN, HLD known to IR from prior L renal mass cryoablation by Dr. Maryelizabeth Kaufmann 05/15/21. 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  Patient was referred back to IR for consideration of additional intervention on the right. He met with Dr. Maryelizabeth Kaufmann 09/11/21 after reviewed of his MR by Urology. After discussion [name] elected to proceed with intervention.   [name] presents to [facility] today in his usual state of health. He denies fever, chills, nausea, vomiting, abdominal pain, shortness of breath, dysuria, hematuria.  He describes an uneventful, uncomplicated course and recovery from his last renal cryoablation. He is agreeable to proceed.     Past Medical History:  Diagnosis Date   Anginal pain ([place])    Chest pain 02/13/2012   echo - EF >55%;mod concentric L ventricular hypertrophy; atrial septum aneurysmal; patent foramen ovale suspected on color flow, LA mil/mod dilation; RV systolic pressure [id number]   Chest pain 06/09/2007   echo - EF 45-50%; interarterial shunt; mild valvular regurgitation;    Chest pain 06/09/2007   Myoview - EF 53%; normal perfusion all regions;EKG negative for ischemia    Chronic kidney disease    Diabetes mellitus    type II   DIABETES MELLITUS, TYPE II 08/29/2009   GERD (gastroesophageal reflux  disease) 08/12/2010   Hemorrhoids    History of anal fissures    Hyperlipidemia    HYPERLIPIDEMIA 08/29/2009   Hypertension    HYPERTENSION 01/14/2008   Lightheadedness 05/10/2002   30d monitor unremarkable   LVH (left ventricular hypertrophy)    with systolic dysfunction by echo 10/03, EF 45-50% - Dr. Melvern Banker   Multinodular goiter 12/06/2015   Nonspecific ST-T wave electrocardiographic changes 08/11/2006   Myoview - EF 49%; normal perfusion all regions, EKG negative for ischemia   OSA (obstructive sleep apnea)    Preventative health care 08/11/2010   Sleep apnea 08/13/2008   AHI 1.2; sleep study 10/2007 - AHI 9.47at 15cm H2O and 31.3 at [place], OBSTRUCTIVE 01/14/2008    Past Surgical History:  Procedure Laterality Date   ANAL SPHINCTEROTOMY  08/20/11   CARDIAC CATHETERIZATION  10/23/2008   normal coronary arteries and LV function   COLONOSCOPY     IR RADIOLOGIST EVAL & MGMT  04/12/2021   IR RADIOLOGIST EVAL & MGMT  08/13/2021   IR RADIOLOGIST EVAL & MGMT  09/11/2021   POLYPECTOMY     RADIOLOGY WITH ANESTHESIA N/A 05/15/2021   Procedure: MICROWAVE ABLATION;  Surgeon: Michaelle Birks, MD;  Location: [facility];  Service: Radiology;  Laterality: N/A;    Allergies: Sulfa antibiotics and Vicodin [hydrocodone-acetaminophen]  Medications: Prior to Admission medications   Medication Sig Start Date End Date Taking? Authorizing Provider  amLODipine-benazepril (LOTREL) 10-40 MG capsule TAKE 1 CAPSULE BY MOUTH EVERY DAY 05/03/21   Biagio Borg, MD  Blood Glucose Monitoring Suppl (ONE TOUCH ULTRA  2) w/Device KIT Use as directed  E11.9 12/11/16   Biagio Borg, MD  hydrALAZINE (APRESOLINE) 50 MG tablet Take 1 tablet (50 mg total) by mouth 3 (three) times daily. 09/26/21   Biagio Borg, MD  hydrochlorothiazide (HYDRODIURIL) 25 MG tablet Take 1 tablet (25 mg total) by mouth daily. 02/13/21   Biagio Borg, MD  Lancets MISC Use as directed E11.9 12/11/16   Biagio Borg, MD  metFORMIN  (GLUCOPHAGE-XR) 500 MG 24 hr tablet TAKE 2 TABLETS BY MOUTH EVERY DAY WITH BREAKFAST 12/24/20   Biagio Borg, MD  nebivolol (BYSTOLIC) 10 MG tablet TAKE 2 TABLETS BY MOUTH EVERY DAY Annual appt due in must see provider for future refills 11/01/21   Biagio Borg, MD  omeprazole (PRILOSEC) 40 MG capsule TAKE 1 CAPSULE BY MOUTH EVERY DAY 07/09/21   Biagio Borg, MD  potassium chloride (KLOR-CON M10) 10 MEQ tablet Take 1 tablet (10 mEq total) by mouth daily. Annual appt is due w/ labs must see provider for future refills 11/05/21   Biagio Borg, MD  pravastatin (PRAVACHOL) 80 MG tablet TAKE 1 TABLET BY MOUTH EVERY DAY 07/09/21   Biagio Borg, MD  sitaGLIPtin (JANUVIA) 100 MG tablet TAKE 1 TABLET BY MOUTH EVERY DAY 07/09/21   Biagio Borg, MD     Family History  Problem Relation Age of Onset   Hypertension Mother    Heart disease Father    Colon cancer Neg Hx    Thyroid disease Neg Hx     Social History   Socioeconomic History   Marital status: Single    Spouse name: Not on file   Number of children: 1   Years of education: Not on file   Highest education level: Not on file  Occupational History   Occupation: work as truck Education administrator: [employer]  Tobacco Use   Smoking status: Every Day    Packs/day: 0.50    Years: 30.00    Total pack years: 15.00    Types: Cigarettes   Smokeless tobacco: Never  Vaping Use   Vaping Use: Never used  Substance and Sexual Activity   Alcohol use: Yes    Alcohol/week: 3.0 standard drinks of alcohol    Types: 3 Standard drinks or equivalent per week    Comment: occaisionally   Drug use: No   Sexual activity: Not on file  Other Topics Concern   Not on file  Social History Narrative   Not on file   Social Determinants of Health   Financial Resource Strain: Not on file  Food Insecurity: Not on file  Transportation Needs: Not on file  Physical Activity: Not on file  Stress: Not on file  Social Connections: Not on file      Review of Systems: A 12 point ROS discussed and pertinent positives are indicated in the HPI above.  All other systems are negative.  Review of Systems  Constitutional:  Negative for fatigue and fever.  Respiratory:  Negative for cough and shortness of breath.   Cardiovascular:  Negative for chest pain.  Gastrointestinal:  Negative for abdominal pain, nausea and vomiting.  Genitourinary:  Negative for dysuria and hematuria.  Musculoskeletal:  Negative for back pain.  Psychiatric/Behavioral:  Negative for behavioral problems and confusion.     Vital Signs: There were no vitals taken for this visit.  Physical Exam Vitals and nursing note reviewed.  Constitutional:      General: He  is not in acute distress.    Appearance: Normal appearance. He is not ill-appearing.  HENT:     Mouth/Throat:     Mouth: Mucous membranes are moist.     Pharynx: Oropharynx is clear.  Cardiovascular:     Rate and Rhythm: Normal rate and regular rhythm.  Pulmonary:     Effort: Pulmonary effort is normal. No respiratory distress.     Breath sounds: Normal breath sounds.  Abdominal:     General: Abdomen is flat.     Palpations: Abdomen is soft.  Skin:    General: Skin is warm and dry.  Neurological:     General: No focal deficit present.     Mental Status: He is alert and oriented to person, place, and time. Mental status is at baseline.  Psychiatric:        Mood and Affect: Mood normal.        Behavior: Behavior normal.        Thought Content: Thought content normal.        Judgment: Judgment normal.      MD Evaluation Airway: WNL Heart: WNL Abdomen: WNL Chest/ Lungs: WNL ASA  Classification: 3 Mallampati/Airway Score: Two   Imaging: DG Chest 1 View  Result Date: 11/06/2021 CLINICAL DATA:  Pre right renal mass ablation evaluation. EXAM: CHEST  1 VIEW COMPARISON:  02/07/2021 FINDINGS: Normal sized heart. Clear lungs with normal vascularity. Mild thoracic spine degenerative  changes. IMPRESSION: No acute abnormality. Electronically Signed   By: Claudie Revering M.D.   On: 11/06/2021 10:12    Labs:  CBC: Recent Labs    02/07/21 0717 04/30/21 1330 05/16/21 0410 10/29/21 0817  WBC 5.8 7.2 10.6* 5.1  HGB 12.7* 12.4* 13.0 13.1  HCT 36.3* 36.4* 37.6* 37.9*  PLT 241 221 220 235    COAGS: Recent Labs    05/15/21 1015 10/29/21 0817  INR 1.0 1.0    BMP: Recent Labs    04/30/21 1330 05/15/21 1015 05/16/21 0410 10/29/21 0817  NA 141 138 135 143  K 3.8 3.1* 3.3* 3.3*  CL 104 100 100 105  CO2 _0 GLUCOSE 97 104* 198* 91  BUN 25* 19 29* 18  CALCIUM 9.3 9.7 8.9 9.3  CREATININE 1.42* 1.26* 1.84* 1.38*  GFRNONAA 56* >60 41* 58*    LIVER FUNCTION TESTS: Recent Labs    02/05/21 1624 02/07/21 0717  BILITOT 0.5 1.0  AST 17 18  ALT 11 11  ALKPHOS 59 57  PROT 7.3 6.6  ALBUMIN 4.7 3.9    TUMOR MARKERS: No results for input(s): "AFPTM", "CEA", "CA199", "CHROMGRNA" in the last 8760 hours.  Assessment and Plan: Patient with past medical history of left renal mass s/p cryoablation therapy with good response who now presents with complaint progression of right renal mass.  IR consulted for right renal ablation at the request of Dr. Abner Greenspan. Case reviewed by Dr. Maryelizabeth Kaufmann who approves patient for procedure and has met in consultation to discuss.  Patient presents today in their usual state of health. He has recovered well from his left-sided cryoablation.  States he tolerated procedure and recovery well.  Following imaging currently shows no recurrence or residual disease He has been NPO and is not currently on blood thinners.   He is understanding of admission for observation overnight.   Risks and benefits of image guided renal cryoablation was discussed with the patient including, but not limited to, failure to treat entire lesion, bleeding, infection, damage to  adjacent structures, hematuria, urine leak, decrease in renal function or post  procedural neuropathy.  All of the patient's questions were answered and the patient is agreeable to proceed. Consent signed and in chart.   Thank you for this interesting consult.  I greatly enjoyed meeting [employer] and look forward to participating in their care.  A copy of this report was sent to the requesting provider on this date.  Electronically Signed: Docia Barrier, PA 11/06/2021, 11:58 AM   I spent a total of    25 Minutes in face to face in clinical consultation, greater than 50% of which was counseling/coordinating care for right renal mass.

## 2021-11-06 NOTE — Transfer of Care (Signed)
Immediate Anesthesia Transfer of Care Note  Patient: [name]  Procedure(s) Performed: IR WITH ANESTHESIA RIGHT RENAL CRYOABLATION (Right)  Patient Location: PACU  Anesthesia Type:General  Level of Consciousness: awake, alert , oriented and patient cooperative  Airway & Oxygen Therapy: Patient Spontanous Breathing and Patient connected to face mask oxygen  Post-op Assessment: Report given to RN, Post -op Vital signs reviewed and stable and Patient moving all extremities X 4  Post vital signs: Reviewed and stable  Last Vitals:  Vitals Value Taken Time  BP 128/77 11/06/21 1615  Temp 36.3 C 11/06/21 1615  Pulse 72 11/06/21 1620  Resp 18 11/06/21 1620  SpO2 100 % 11/06/21 1620  Vitals shown include unvalidated device data.  Last Pain:  Vitals:   11/06/21 1615  TempSrc:   PainSc: Asleep         Complications: No notable events documented.

## 2021-11-06 NOTE — Sedation Documentation (Signed)
Anesthesia at bedside to sedate and monitor.

## 2021-11-06 NOTE — Anesthesia Preprocedure Evaluation (Signed)
Anesthesia Evaluation  Patient identified by MRN, date of birth, ID band Patient awake    Reviewed: Allergy & Precautions, NPO status , Patient's Chart, lab work & pertinent test results  Airway Mallampati: II  TM Distance: >3 FB Neck ROM: Full    Dental no notable dental hx.    Pulmonary sleep apnea , Current Smoker and Patient abstained from smoking.,    Pulmonary exam normal breath sounds clear to auscultation       Cardiovascular hypertension, +CHF  Normal cardiovascular exam+ Valvular Problems/Murmurs MR  Rhythm:Regular Rate:Normal     Neuro/Psych negative neurological ROS  negative psych ROS   GI/Hepatic negative GI ROS, Neg liver ROS,   Endo/Other  diabetes, Type 2Hyperthyroidism   Renal/GU negative Renal ROS  negative genitourinary   Musculoskeletal negative musculoskeletal ROS (+)   Abdominal   Peds negative pediatric ROS (+)  Hematology negative hematology ROS (+)   Anesthesia Other Findings   Reproductive/Obstetrics negative OB ROS                             Anesthesia Physical Anesthesia Plan  ASA: 3  Anesthesia Plan: General   Post-op Pain Management: Ofirmev IV (intra-op)*   Induction: Intravenous  PONV Risk Score and Plan: 1 and Ondansetron, Dexamethasone and Treatment may vary due to age or medical condition  Airway Management Planned: Oral ETT  Additional Equipment:   Intra-op Plan:   Post-operative Plan: Extubation in OR  Informed Consent: I have reviewed the patients History and Physical, chart, labs and discussed the procedure including the risks, benefits and alternatives for the proposed anesthesia with the patient or authorized representative who has indicated his/her understanding and acceptance.     Dental advisory given  Plan Discussed with: CRNA and Surgeon  Anesthesia Plan Comments:         Anesthesia Quick Evaluation

## 2021-11-06 NOTE — Anesthesia Procedure Notes (Addendum)
Procedure Name: Intubation Date/Time: 11/06/2021 1:13 PM  Performed by: Maxwell Caul, CRNAPre-anesthesia Checklist: Patient identified, Emergency Drugs available, Suction available and Patient being monitored Patient Re-evaluated:Patient Re-evaluated prior to induction Oxygen Delivery Method: Circle system utilized Preoxygenation: Pre-oxygenation with 100% oxygen Induction Type: IV induction Ventilation: Mask ventilation without difficulty Laryngoscope Size: Mac and 4 Grade View: Grade II Tube type: Oral Tube size: 7.5 mm Number of attempts: 1 Airway Equipment and Method: Stylet and Oral airway Placement Confirmation: ETT inserted through vocal cords under direct vision, positive ETCO2 and breath sounds checked- equal and bilateral Secured at: 22 cm Tube secured with: Tape Dental Injury: Teeth and Oropharynx as per pre-operative assessment

## 2021-11-06 NOTE — H&P (Signed)
  The note originally documented on this encounter has been moved the the encounter in which it belongs.  

## 2021-11-06 NOTE — Plan of Care (Signed)

## 2021-11-06 NOTE — Procedures (Signed)
Vascular and Interventional Radiology Procedure Note  Patient: [name] DOB: 1960/12/11 Medical Record Number: [id number] Note Date/Time: 11/06/21 4:25 PM   Performing Physician: Michaelle Birks, MD Assistant(s): None  Diagnosis: Complex R renal cystic mass   Procedure:  RIGHT RENAL MASS BIOPSY PERCUTANEOUS CRYOABLATION of RIGHT RENAL MASS   Anesthesia: General Anesthesia Complications: None Estimated Blood Loss: Minimal Specimens: Sent for Pathology   Findings:  Successful CT-guided biopsy of R renal mass. A total of 3 samples were obtained.  Successful CT-guided cryoablation using 2x IcePearl 2.1 CX probes. Contrasted post-ablation CT with adequate lesional coverage.  Hemostasis of the tract was achieved using Manual Pressure.  A small volume of extravasation and R perinephric hematoma were present, though non-enlarging on a follow up scan.  Pt remained VSS / HDS.   Plan: Keep NPO until 1900hrs. Bed rest for 2 hours.  See detailed procedure note with images in PACS. The patient tolerated the procedure well without incident or complication and was returned to PACU in stable condition.    Michaelle Birks, MD [facility]iology Specialists [facility] Radiology   Pager. [place]

## 2021-11-07 ENCOUNTER — Encounter (HOSPITAL_COMMUNITY): Payer: Self-pay | Admitting: Interventional Radiology

## 2021-11-07 ENCOUNTER — Other Ambulatory Visit: Payer: Self-pay | Admitting: Physician Assistant

## 2021-11-07 DIAGNOSIS — C641 Malignant neoplasm of right kidney, except renal pelvis: Secondary | ICD-10-CM | POA: Diagnosis not present

## 2021-11-07 DIAGNOSIS — N2889 Other specified disorders of kidney and ureter: Secondary | ICD-10-CM

## 2021-11-07 LAB — CBC
HCT: 34.3 % — ABNORMAL LOW (ref 39.0–52.0)
Hemoglobin: 11.9 g/dL — ABNORMAL LOW (ref 13.0–17.0)
MCH: 32.9 pg (ref 26.0–34.0)
MCHC: 34.7 g/dL (ref 30.0–36.0)
MCV: 94.8 fL (ref 80.0–100.0)
Platelets: 194 10*3/uL (ref 150–400)
RBC: 3.62 MIL/uL — ABNORMAL LOW (ref 4.22–5.81)
RDW: 13.1 % (ref 11.5–15.5)
WBC: 10.3 10*3/uL (ref 4.0–10.5)
nRBC: 0 % (ref 0.0–0.2)

## 2021-11-07 LAB — BASIC METABOLIC PANEL
Anion gap: 13 (ref 5–15)
BUN: 24 mg/dL — ABNORMAL HIGH (ref 8–23)
CO2: 24 mmol/L (ref 22–32)
Calcium: 8.8 mg/dL — ABNORMAL LOW (ref 8.9–10.3)
Chloride: 100 mmol/L (ref 98–111)
Creatinine, Ser: 1.65 mg/dL — ABNORMAL HIGH (ref 0.61–1.24)
GFR, Estimated: 47 mL/min — ABNORMAL LOW (ref >60)
Glucose, Bld: 151 mg/dL — ABNORMAL HIGH (ref 70–99)
Potassium: 3.2 mmol/L — ABNORMAL LOW (ref 3.5–5.1)
Sodium: 137 mmol/L (ref 135–145)

## 2021-11-07 NOTE — Anesthesia Postprocedure Evaluation (Signed)
Anesthesia Post Note  Patient: Manufacturing engineer  Procedure(s) Performed: IR WITH ANESTHESIA RIGHT RENAL CRYOABLATION (Right)     Patient location during evaluation: PACU Anesthesia Type: General Level of consciousness: awake and alert Pain management: pain level controlled Vital Signs Assessment: post-procedure vital signs reviewed and stable Respiratory status: spontaneous breathing, nonlabored ventilation, respiratory function stable and patient connected to nasal cannula oxygen Cardiovascular status: blood pressure returned to baseline and stable Postop Assessment: no apparent nausea or vomiting Anesthetic complications: no   No notable events documented.  Last Vitals:  Vitals:   11/07/21 0231 11/07/21 0520  BP: 112/76 106/67  Pulse: 66 71  Resp: 20 18  Temp:  36.8 C  SpO2: 94% 97%    Last Pain:  Vitals:   11/07/21 0520  TempSrc: Oral  PainSc:                  Seann Genther S

## 2021-11-07 NOTE — Discharge Summary (Signed)
Patient ID: [name] MRN: [id number] DOB/AGE: 62-Dec-1962 62 y.o.  Admit date: 11/06/2021 Discharge date: 11/07/2021  Supervising Physician: Jacqulynn Cadet  Patient Status: [facility] - In-pt  Admission Diagnoses: Right renal mass  Discharge Diagnoses:  Principal Problem:   Renal cancer, right [facility])   Discharged Condition: good  Hospital Course:   [name] is a 61 y.o. male with past medical history of DM, GERD, HTN, and HLD.  He is known to IR from prior LEFT renal mass cryoablation by Dr. Maryelizabeth Kaufmann on 05/15/21.   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    He was evaluated by Dr. Maryelizabeth Kaufmann 09/11/21 after reviewed of his MR by Urology and felt to be good candidate for cryoablation on the RIGHT.  He underwent RIGHT biopsy and cryoablation yesterday by Dr. Maryelizabeth Kaufmann.  He has done very well post procedure.  He has no pain.  He has ambulated and voided and is tolerating a diet without N/V.   Labs:  Lab Results  Component Value Date   WBC 10.3 11/07/2021   HGB 11.9 (L) 11/07/2021   HCT 34.3 (L) 11/07/2021   MCV 94.8 11/07/2021   PLT 194 11/07/2021   Lab Results  Component Value Date   CREATININE 1.65 (H) 11/07/2021   BUN 24 (H) 11/07/2021   NA 137 11/07/2021   K 3.2 (L) 11/07/2021   CL 100 11/07/2021   CO2 24 11/07/2021     Treatments:   CLINICAL DATA:  Briefly, 61 year old male comorbid with CKD and LEFT RCC s/p MWA in 05/15/2021. Patient with contralateral, RIGHT complex cystic mass suspicious for RCC and recommended for treatment   EXAM: Procedures:   1. CT-GUIDED CORE  BIOPSY OF RIGHT RENAL MASS   2. CT-GUIDED PERCUTANEOUS CRYOABLATION OF RIGHT RENAL MASS   ANESTHESIA/SEDATION: Sedation by the Anesthesia Team was performed. Please see anesthesiology log for details.   MEDICATIONS: Ancef 2 gm IV .The antibiotic was administered in an appropriate time interval prior to needle puncture of the skin.   CONTRAST:  174m OMNIPAQUE IOHEXOL 300 MG/ML  SOLN   PROCEDURE: The procedure, risks, benefits, and alternatives were explained to the patient. Questions regarding the procedure were encouraged and answered. The patient understands and consents to the procedure.   The patient was placed under general anesthesia. Initial un-enhanced CT was performed in a prone position to localize the inferior pole cortical RIGHT cystic renal mass.   The patient's RIGHT flank was prepped with chlorhexidine in a sterile fashion, and a sterile drape was applied covering the operative field. A sterile gown and sterile gloves were used for the procedure.   A 17 gauge trocar needle was advanced into the RIGHT renal mass. Core biopsy was performed with an 18 gauge automated core biopsy   device. A total of 3 core samples were submitted in formalin for pathologic analysis.   Under CT guidance, 2x IcePearl 2.1 CX percutaneous cryoablation probes were advanced into the RIGHT renal mass. Probe positioning was confirmed by CT prior to cryoablation.   Cryoablation was performed using a standard protocol. An initial 10 minute cycle of cryoablation was performed. This was followed  by a 8 minute thaw cycle. A second 10 minute cycle of cryoablation was then performed. During ablation, periodic CT imaging was performed to monitor ice ball formation and morphology. After active thaw, the cryoablation probes were removed. A contrasted post-procedural CT was performed.   A dressing was placed. The patient tolerated the procedure well without immediate post procedural  complication   COMPLICATIONS: Small volume of extravasation and RIGHT perinephric hematoma was present. This was nonenlarged on an follow-up scan.   SIR Level A - No therapy, no consequence.   FINDINGS: 1. 2.1 cm RIGHT complex cystic renal mass. 2. Technically successful CT guided biopsy and cryoablation with adequate lesional coverage by the ice ball. 3. Small volume of extravasation and RIGHT perinephric hematoma was present. This was nonenlarged on an follow-up scan.   IMPRESSION: Successful CT-guided percutaneous core biopsy and cryoablation of RIGHT renal mass, as above.   PLAN: The patient will be admitted overnight for observation.   He will return to Vascular Interventional Radiology (VIR) for follow-up in 3 months with a contrasted, multiphasic CT AP.   Michaelle Birks, MD   [facility]   [facility] Radiology     Electronically Signed   By: Michaelle Birks M.D.   On: 11/06/2021 17:21  Discharge Exam: Blood pressure 106/67, pulse 71, temperature 98.2 F (36.8 C), temperature source Oral, resp. rate 18, height $RemoveBe'6\' 1"'sLcpECXba$  (1.854 m), weight 169 lb 15.6 oz (77.1 kg), SpO2 97 %. Awake and alert NAD Lungs clear No respiratory distress Heart RRR Access sites ok, no bleeding   Disposition: Discharge disposition: 01-Home or Self Care       Discharge Instructions     Call MD for:  persistant nausea and vomiting   Complete by: As directed    Call MD for:  redness, tenderness, or signs of infection (pain, swelling, redness, odor or green/yellow discharge around incision site)   Complete by: As directed    Call MD for:  severe uncontrolled pain   Complete by: As directed    Call MD for:  temperature >100.4   Complete by: As directed    Diet - low sodium heart healthy   Complete by: As directed    Increase activity slowly   Complete by: As directed    Remove dressing in 24 hours   Complete by: As directed       Allergies as  of 11/07/2021       Reactions   Sulfa Antibiotics Hives, Swelling   Vicodin [hydrocodone-acetaminophen] Itching   Patient states he can take Tylenol without difficulty.        Medication List     TAKE these medications    amLODipine-benazepril 10-40 MG capsule Commonly known as: LOTREL TAKE 1 CAPSULE BY MOUTH EVERY DAY   hydrALAZINE 50 MG tablet Commonly known as: APRESOLINE Take 1 tablet (50 mg total) by mouth 3 (three) times daily.   hydrochlorothiazide 25 MG tablet Commonly known as: HYDRODIURIL Take 1 tablet (25 mg total) by mouth daily.   Januvia 100 MG tablet Generic drug: sitaGLIPtin TAKE 1 TABLET BY MOUTH EVERY DAY   Lancets Misc Use as directed E11.9   metFORMIN 500 MG 24 hr tablet Commonly known as: GLUCOPHAGE-XR TAKE 2 TABLETS BY MOUTH EVERY DAY WITH BREAKFAST   nebivolol 10 MG tablet Commonly known as: BYSTOLIC TAKE 2 TABLETS BY MOUTH EVERY DAY Annual appt due in must see provider for future refills   omeprazole 40 MG capsule Commonly known as:  PRILOSEC TAKE 1 CAPSULE BY MOUTH EVERY DAY   ONE TOUCH ULTRA 2 w/Device Kit Use as directed  E11.9   potassium chloride 10 MEQ tablet Commonly known as: Klor-Con M10 Take 1 tablet (10 mEq total) by mouth daily. Annual appt is due w/ labs must see provider for future refills   pravastatin 80 MG tablet Commonly known as: PRAVACHOL TAKE 1 TABLET BY MOUTH EVERY DAY        *IR Clinic will call patient with televisit follow up instructions and will obtain follow up imaging per protocol.  Electronically Signed: Murrell Redden, PA-C 11/07/2021, 9:10 AM     I have spent Less Than 30 Minutes discharging [name].

## 2021-11-07 NOTE — TOC Transition Note (Signed)
Transition of Care [facility]) - CM/SW Discharge Note   Patient Details  Name: Shivam Mestas MRN: [id number] Date of Birth: 05-18-1960  [facility]) CM/SW Contact:  Roseanne Kaufman, RN Phone Number: 11/07/2021, 9:42 AM   Clinical Narrative:    Spoke with patient no TOC needs.   Patient Details  Name: Abass Misener Date of Birth: 04-04-1960   Transition of Care [facility]) CM/SW Contact:    Roseanne Kaufman, RN Phone Number: 11/07/2021, 9:44 AM    Transition of Care Department [facility]) has reviewed patient and no TOC needs have been identified at this time. We will continue to monitor patient advancement through interdisciplinary progression rounds. If new patient transition needs arise, please place a TOC consult.        Barriers to Discharge: No Barriers Identified   Patient Goals and CMS Choice Patient states their goals for this hospitalization and ongoing recovery are:: home      Discharge Placement                       Discharge Plan and Services   Discharge Planning Services: CM Consult                                 Social Determinants of Health (SDOH) Interventions     Readmission Risk Interventions     No data to display

## 2021-11-07 NOTE — Plan of Care (Signed)
  Problem: Education: Goal: Knowledge of General Education information will improve Description: Including pain rating scale, medication(s)/side effects and non-pharmacologic comfort measures Outcome: Adequate for Discharge   Problem: Health Behavior/Discharge Planning: Goal: Ability to manage health-related needs will improve Outcome: Adequate for Discharge   Problem: Clinical Measurements: Goal: Ability to maintain clinical measurements within normal limits will improve Outcome: Adequate for Discharge   Problem: Elimination: Goal: Will not experience complications related to urinary retention Outcome: Adequate for Discharge

## 2021-11-08 ENCOUNTER — Telehealth (HOSPITAL_COMMUNITY): Payer: Self-pay | Admitting: Interventional Radiology

## 2021-11-08 NOTE — Progress Notes (Signed)
Vascular and Interventional Radiology  Phone Note  Patient: [name] DOB: 06/11/1960 Medical Record Number: [id number] Note Date/Time: 11/08/21 11:51 AM   Diagnosis: Post op R renal mass Rx   I identified myself to the patient and conveyed my credentials to [place] For medical emergencies, Pt was advised to call 911 or go to the nearest emergency room.   Assessment  Plan: 61 y.o. year old male POD 2 s/p cryoablation of complex cystic R renal mass on 11/06/21.  VIR reached out in courtesy follow-up.  Post op hematoma, managed conservatively. Pt discharged on POD 1 without event. Pt reports that they are doing well. No discomfort nor concern at this time.  Follow up MR to be obtained 3 mos post ablation (11/06/21), no sooner. Pt to follow up with me in Clinic within 1 month post op.   As part of this Telephone encounter, no in-person exam was conducted.  The patient was physically located in [place] or a state in which I am permitted to provide care. The encounter was reasonable and appropriate under the circumstances given the patient's presentation at the time.  The patient and/or parent/guardian has been advised of the potential risks and limitations of this mode of treatment (including, but not limited to, the absence of in-person examination) and has agreed to be treated using telemedicine. The patient's/patient's family's questions regarding their request have been answered and/or has also been advised to contact their provider's office for worsening conditions, and seek emergency medical treatment and/or call 911 if the patient deems either necessary.   Michaelle Birks, MD Vascular and Interventional Radiology Specialists [facility] Radiology   Pager. [place]

## 2021-11-11 LAB — SURGICAL PATHOLOGY

## 2021-11-23 ENCOUNTER — Other Ambulatory Visit: Payer: Self-pay | Admitting: Internal Medicine

## 2021-11-23 NOTE — Telephone Encounter (Signed)
Please refill as per office routine med refill policy (all routine meds to be refilled for 3 mo or monthly (per pt preference) up to one year from last visit, then month to month grace period for 3 mo, then further med refills will have to be denied) ? ?

## 2021-11-24 ENCOUNTER — Other Ambulatory Visit: Payer: Self-pay | Admitting: Internal Medicine

## 2021-11-24 NOTE — Telephone Encounter (Signed)
Please refill as per office routine med refill policy (all routine meds to be refilled for 3 mo or monthly (per pt preference) up to one year from last visit, then month to month grace period for 3 mo, then further med refills will have to be denied) ? ?

## 2021-12-04 ENCOUNTER — Ambulatory Visit
Admission: RE | Admit: 2021-12-04 | Discharge: 2021-12-04 | Disposition: A | Discharge status: Home / Self Care | Payer: 59 | Source: Ambulatory Visit | Attending: Physician Assistant | Admitting: Physician Assistant

## 2021-12-04 DIAGNOSIS — N2889 Other specified disorders of kidney and ureter: Secondary | ICD-10-CM

## 2021-12-04 HISTORY — PX: IR RADIOLOGIST EVAL & MGMT: IMG5224

## 2021-12-04 NOTE — Progress Notes (Signed)
Visit Reason: Follow up s/p R renal mass ablation   Care Team: Janith Lima MD; Urology Biagio Borg, MD; PCP   Virtual Visit via Telephone Note   I connected with [name]  at 1:00 PM EST by telephone and verified that I am speaking with the correct person using two identifiers.   I discussed the limitations, risks, security and privacy concerns of performing an evaluation and management service by telephone and the availability of in-person appointments.   History of Present Illness:   [name] is a 61 y.o. male well known to me s/p prior ablation of L renal mass c/w papillary RCC. Pt had originally presented to ER w fatigue in 01/2021 and was found to have an AKI. Diagnostic workup included a renal US which identified an incidental 2.1 cm R renal cystic lesion for which followup was recommended with MRI, and deemed Bosniak IIF (02/2021). An incidental, contralateral 1 cm left upper pole mass had imaging characteristics consistent with aforementioned papillary RCC. He underwent successful and uneventful L renal mass microwave ablation by me on 05/15/21. On imaging follow up, the R renal mass was upgraded ofrom Bosniak IIF to III. He is followed closely by Urology, Dr Abner Greenspan, and was re-referred to me for a minimally invasive treatment option of this lesion.   He underwent biopsy and cryoablation of complex cystic R renal mass on 11/06/21. Procedure was minimally complicated by post op hematoma, managed conservatively. Pt was discharged on POD 1 without event. 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 He is seen in initial follow up 1 month post procedure and reports to back to baseline health. He took 2 wks from work to recover but has been back to his full capacity at work.     Review of Systems: A 12-point ROS discussed, and pertinent positives are indicated in the HPI above.  All other systems are negative.   Past Medical History:  Diagnosis Date   Anginal  pain ([place])    Chest pain 02/13/2012   echo - EF >55%;mod concentric L ventricular hypertrophy; atrial septum aneurysmal; patent foramen ovale suspected on color flow, LA mil/mod dilation; RV systolic pressure [id number]   Chest pain 06/09/2007   echo - EF 45-50%; interarterial shunt; mild valvular regurgitation;    Chest pain 06/09/2007   Myoview - EF 53%; normal perfusion all regions;EKG negative for ischemia    Chronic kidney disease    Diabetes mellitus    type II   DIABETES MELLITUS, TYPE II 08/29/2009   GERD (gastroesophageal reflux disease) 08/12/2010   Hemorrhoids    History of anal fissures    Hyperlipidemia    HYPERLIPIDEMIA 08/29/2009   Hypertension    HYPERTENSION 01/14/2008   Lightheadedness 05/10/2002   30d monitor unremarkable   LVH (left ventricular hypertrophy)    with systolic dysfunction by echo 10/03, EF 45-50% - Dr. Melvern Banker   Multinodular goiter 12/06/2015   Nonspecific ST-T wave electrocardiographic changes 08/11/2006   Myoview - EF 49%; normal perfusion all regions, EKG negative for ischemia   OSA (obstructive sleep apnea)    Preventative health care 08/11/2010   Sleep apnea 08/13/2008   AHI 1.2; sleep study 10/2007 - AHI 9.47at 15cm H2O and 31.3 at [place], OBSTRUCTIVE 01/14/2008    Past Surgical History:  Procedure Laterality Date   ANAL SPHINCTEROTOMY  08/20/11   CARDIAC CATHETERIZATION  10/23/2008   normal coronary arteries and LV function   COLONOSCOPY  IR RADIOLOGIST EVAL & MGMT  04/12/2021   IR RADIOLOGIST EVAL & MGMT  08/13/2021   IR RADIOLOGIST EVAL & MGMT  09/11/2021   POLYPECTOMY     RADIOLOGY WITH ANESTHESIA N/A 05/15/2021   Procedure: MICROWAVE ABLATION;  Surgeon: Michaelle Birks, MD;  Location: [facility];  Service: Radiology;  Laterality: N/A;   RADIOLOGY WITH ANESTHESIA Right 11/06/2021   Procedure: IR WITH ANESTHESIA RIGHT RENAL CRYOABLATION;  Surgeon: Michaelle Birks, MD;  Location[facility];  Service: Radiology;  Laterality: Right;     Allergies: Sulfa antibiotics and Vicodin [hydrocodone-acetaminophen]  Medications: Prior to Admission medications   Medication Sig Start Date End Date Taking? Authorizing Provider  amLODipine-benazepril (LOTREL) 10-40 MG capsule TAKE 1 CAPSULE BY MOUTH EVERY DAY 05/03/21   Biagio Borg, MD  Blood Glucose Monitoring Suppl (ONE TOUCH ULTRA 2) w/Device KIT Use as directed  E11.9 12/11/16   Biagio Borg, MD  hydrALAZINE (APRESOLINE) 50 MG tablet Take 1 tablet (50 mg total) by mouth 3 (three) times daily. 09/26/21   Biagio Borg, MD  hydrochlorothiazide (HYDRODIURIL) 25 MG tablet Take 1 tablet (25 mg total) by mouth daily. 02/13/21   Biagio Borg, MD  JANUVIA 100 MG tablet TAKE 1 TABLET BY MOUTH EVERY DAY 11/25/21   Biagio Borg, MD  Lancets MISC Use as directed E11.9 12/11/16   Biagio Borg, MD  metFORMIN (GLUCOPHAGE-XR) 500 MG 24 hr tablet TAKE 2 TABLETS BY MOUTH EVERY DAY WITH BREAKFAST 12/24/20   Biagio Borg, MD  nebivolol (BYSTOLIC) 10 MG tablet TAKE 2 TABLETS BY MOUTH EVERY DAY 11/25/21   Biagio Borg, MD  omeprazole (PRILOSEC) 40 MG capsule TAKE 1 CAPSULE BY MOUTH EVERY DAY 07/09/21   Biagio Borg, MD  potassium chloride (KLOR-CON M10) 10 MEQ tablet Take 1 tablet (10 mEq total) by mouth daily. Annual appt is due w/ labs must see provider for future refills 11/05/21   Biagio Borg, MD  pravastatin (PRAVACHOL) 80 MG tablet TAKE 1 TABLET BY MOUTH EVERY DAY 11/25/21   Biagio Borg, MD     Family History  Problem Relation Age of Onset   Hypertension Mother    Heart disease Father    Colon cancer Neg Hx    Thyroid disease Neg Hx     Social History   Socioeconomic History   Marital status: Single    Spouse name: Not on file   Number of children: 1   Years of education: Not on file   Highest education level: Not on file  Occupational History   Occupation: work as truck Education administrator: [employer]  Tobacco Use   Smoking status: Every Day    Packs/day: 0.50    Years:  30.00    Total pack years: 15.00    Types: Cigarettes   Smokeless tobacco: Never  Vaping Use   Vaping Use: Never used  Substance and Sexual Activity   Alcohol use: Yes    Alcohol/week: 3.0 standard drinks of alcohol    Types: 3 Standard drinks or equivalent per week    Comment: occaisionally   Drug use: No   Sexual activity: Not on file  Other Topics Concern   Not on file  Social History Narrative   Not on file   Social Determinants of Health   Financial Resource Strain: Not on file  Food Insecurity: Not on file  Transportation Needs: Not on file  Physical Activity: Not on file  Stress:  Not on file  Social Connections: Not on file    Vital Signs: There were no vitals taken for this visit.  Physical Exam  Deferred secondary to virtual visit.   Pathology:  SURGICAL PATHOLOGY  CASE: [id number]  PATIENT: [name]  Surgical Pathology Report   Clinical History: Complex right cystic renal mass (crm)    FINAL MICROSCOPIC DIAGNOSIS:   A. KIDNEY, RIGHT, BIOPSY:  Minute fragments compatible with a clear-cell renal cell carcinoma (see  comment)    Imaging:  IR R renal mass cryoablation: 11/06/21 Adequate R complex cystic renal mass targeting and ablation.      Labs:  CBC: Recent Labs    04/30/21 1330 05/16/21 0410 10/29/21 0817 11/07/21 0344  WBC 7.2 10.6* 5.1 10.3  HGB 12.4* 13.0 13.1 11.9*  HCT 36.4* 37.6* 37.9* 34.3*  PLT 221 220 235 194    COAGS: Recent Labs    05/15/21 1015 10/29/21 0817  INR 1.0 1.0    BMP: Recent Labs    05/15/21 1015 05/16/21 0410 10/29/21 0817 11/07/21 0344  NA 138 135 143 137  K 3.1* 3.3* 3.3* 3.2*  CL 100 100 105 100  CO2 _0 GLUCOSE 104* 198* 91 151*  BUN 19 29* 18 24*  CALCIUM 9.7 8.9 9.3 8.8*  CREATININE 1.26* 1.84* 1.38* 1.65*  GFRNONAA >60 41* 58* 47*    LIVER FUNCTION TESTS: Recent Labs    02/05/21 1624 02/07/21 0717  BILITOT 0.5 1.0  AST 17 18  ALT 11 11  ALKPHOS 59  57  PROT 7.3 6.6  ALBUMIN 4.7 3.9    Assessment and Plan:  61 y/o M well known to me s/p MWA of a small L renal pRCC on 05/15/21 and more recently cryoablation of complex cystic renal mass on 11/06/21.  Pathology confirmatory for R ccRCC.  *Pt back to baseline. No post procedural concerns. *3 mos follow up MR Abdomen post ablation (11/06/21), no sooner. *Will see him back, ideally in person, for imaging review in 3 mos. *Pt will also follow up with his Urologist, Dr. Rexene Alberts   Thank you for allowing [place] to participate in the care of this Patient. Please contact me with questions, concerns, or if new issues arise.  Electronically Signed:  Michaelle Birks, MD [facility]   Pager. [phone] Clinic. [phone]  I spent a total of 30 Minutes of non-face-to-face time in clinical consultation, greater than 50% of which was counseling/coordinating care for follow up management s/p ablation of R renal cell carcinoma.

## 2021-12-09 ENCOUNTER — Ambulatory Visit
Admission: EM | Admit: 2021-12-09 | Discharge: 2021-12-09 | Disposition: A | Discharge status: Home / Self Care | Payer: 59 | Attending: Physician Assistant | Admitting: Physician Assistant

## 2021-12-09 ENCOUNTER — Encounter: Payer: Self-pay | Admitting: Podiatry

## 2021-12-09 ENCOUNTER — Other Ambulatory Visit: Payer: 59

## 2021-12-09 ENCOUNTER — Ambulatory Visit (INDEPENDENT_AMBULATORY_CARE_PROVIDER_SITE_OTHER): Payer: 59

## 2021-12-09 ENCOUNTER — Other Ambulatory Visit: Payer: Self-pay | Admitting: Internal Medicine

## 2021-12-09 DIAGNOSIS — S92151A Displaced avulsion fracture (chip fracture) of right talus, initial encounter for closed fracture: Secondary | ICD-10-CM

## 2021-12-09 DIAGNOSIS — M79671 Pain in right foot: Secondary | ICD-10-CM

## 2021-12-09 NOTE — ED Triage Notes (Signed)
Pt presents with right foot injury after dropping a piece of furniture on it last night.

## 2021-12-09 NOTE — ED Provider Notes (Signed)
[facility]    CSN: [id number] Arrival date & time: 12/09/21  0840      History   Chief Complaint Chief Complaint  Patient presents with   Foot Injury    HPI [name] is a 61 y.o. male.   Patient here today for evaluation of right foot pain after he dropped a couch on his foot last night. He has not been able to bear weight today due to pain. He has most pain to dorsal proximal foot. He denies any numbness or tingling. Movement of right ankle causes pain in foot as well. He has taken ibuprofen and tylenol without significant relief.   The history is provided by the patient.  Foot Injury Associated symptoms: no fever     Past Medical History:  Diagnosis Date   Anginal pain ([place])    Chest pain 02/13/2012   echo - EF >55%;mod concentric L ventricular hypertrophy; atrial septum aneurysmal; patent foramen ovale suspected on color flow, LA mil/mod dilation; RV systolic pressure [id number]   Chest pain 06/09/2007   echo - EF 45-50%; interarterial shunt; mild valvular regurgitation;    Chest pain 06/09/2007   Myoview - EF 53%; normal perfusion all regions;EKG negative for ischemia    Chronic kidney disease    Diabetes mellitus    type II   DIABETES MELLITUS, TYPE II 08/29/2009   GERD (gastroesophageal reflux disease) 08/12/2010   Hemorrhoids    History of anal fissures    Hyperlipidemia    HYPERLIPIDEMIA 08/29/2009   Hypertension    HYPERTENSION 01/14/2008   Lightheadedness 05/10/2002   30d monitor unremarkable   LVH (left ventricular hypertrophy)    with systolic dysfunction by echo 10/03, EF 45-50% - Dr. Melvern Banker   Multinodular goiter 12/06/2015   Nonspecific ST-T wave electrocardiographic changes 08/11/2006   Myoview - EF 49%; normal perfusion all regions, EKG negative for ischemia   OSA (obstructive sleep apnea)    Preventative health care 08/11/2010   Sleep apnea 08/13/2008   AHI 1.2; sleep study 10/2007 - AHI 9.47at 15cm H2O and 31.3 at [place], OBSTRUCTIVE 01/14/2008    Patient Active Problem List   Diagnosis Date Noted   Right renal mass 11/06/2021   Renal cancer, right ([place]) 11/06/2021   Hyperthyroidism 06/19/2021   Left renal mass 05/15/2021   Encounter for ablation of neoplasm of liver 05/15/2021   Malignant neoplasm of left renal pelvis ([place]) 05/15/2021   AKI (acute kidney injury) ([place]) 02/06/2021   Hallux limitus of left foot 07/17/2020   Capsulitis 09/13/2019   Acute gouty arthritis 08/02/2019   Dysphagia 12/20/2018   Bursitis of right elbow 09/14/2018   Renal insufficiency 12/10/2017   Encounter for well adult exam with abnormal findings 12/10/2017   Multinodular goiter 12/06/2015   Loss of weight 11/21/2014   Tobacco abuse 08/16/2012   Anal fissure 06/19/2011   Hemorrhoids 06/12/2011   GERD (gastroesophageal reflux disease) 08/12/2010   Diabetes ([place]) 08/29/2009   Hyperlipidemia 08/29/2009   SLEEP APNEA, OBSTRUCTIVE 01/14/2008   Essential hypertension 01/14/2008    Past Surgical History:  Procedure Laterality Date   ANAL SPHINCTEROTOMY  08/20/11   CARDIAC CATHETERIZATION  10/23/2008   normal coronary arteries and LV function   COLONOSCOPY     IR RADIOLOGIST EVAL & MGMT  04/12/2021   IR RADIOLOGIST EVAL & MGMT  08/13/2021   IR RADIOLOGIST EVAL & MGMT  09/11/2021   IR RADIOLOGIST EVAL & MGMT  12/04/2021   POLYPECTOMY  RADIOLOGY WITH ANESTHESIA N/A 05/15/2021   Procedure: MICROWAVE ABLATION;  Surgeon: Michaelle Birks, MD;  Location: [facility];  Service: Radiology;  Laterality: N/A;   RADIOLOGY WITH ANESTHESIA Right 11/06/2021   Procedure: IR WITH ANESTHESIA RIGHT RENAL CRYOABLATION;  Surgeon: Michaelle Birks, MD;  Location: [facility];  Service: Radiology;  Laterality: Right;       Home Medications    Prior to Admission medications   Medication Sig Start Date End Date Taking? Authorizing Provider  amLODipine-benazepril (LOTREL) 10-40 MG capsule TAKE 1 CAPSULE BY MOUTH EVERY DAY 05/03/21   Biagio Borg,  MD  Blood Glucose Monitoring Suppl (ONE TOUCH ULTRA 2) w/Device KIT Use as directed  E11.9 12/11/16   Biagio Borg, MD  hydrALAZINE (APRESOLINE) 50 MG tablet Take 1 tablet (50 mg total) by mouth 3 (three) times daily. 09/26/21   Biagio Borg, MD  hydrochlorothiazide (HYDRODIURIL) 25 MG tablet Take 1 tablet (25 mg total) by mouth daily. 02/13/21   Biagio Borg, MD  JANUVIA 100 MG tablet TAKE 1 TABLET BY MOUTH EVERY DAY 11/25/21   Biagio Borg, MD  Lancets MISC Use as directed E11.9 12/11/16   Biagio Borg, MD  metFORMIN (GLUCOPHAGE-XR) 500 MG 24 hr tablet TAKE 2 TABLETS BY MOUTH EVERY DAY WITH BREAKFAST 12/24/20   Biagio Borg, MD  nebivolol (BYSTOLIC) 10 MG tablet TAKE 2 TABLETS BY MOUTH EVERY DAY 11/25/21   Biagio Borg, MD  omeprazole (PRILOSEC) 40 MG capsule TAKE 1 CAPSULE BY MOUTH EVERY DAY 07/09/21   Biagio Borg, MD  potassium chloride (KLOR-CON M10) 10 MEQ tablet Take 1 tablet (10 mEq total) by mouth daily. Annual appt is due w/ labs must see provider for future refills 11/05/21   Biagio Borg, MD  pravastatin (PRAVACHOL) 80 MG tablet TAKE 1 TABLET BY MOUTH EVERY DAY 11/25/21   Biagio Borg, MD    Family History Family History  Problem Relation Age of Onset   Hypertension Mother    Heart disease Father    Colon cancer Neg Hx    Thyroid disease Neg Hx     Social History Social History   Tobacco Use   Smoking status: Every Day    Packs/day: 0.50    Years: 30.00    Total pack years: 15.00    Types: Cigarettes   Smokeless tobacco: Never  Vaping Use   Vaping Use: Never used  Substance Use Topics   Alcohol use: Yes    Alcohol/week: 3.0 standard drinks of alcohol    Types: 3 Standard drinks or equivalent per week    Comment: occaisionally   Drug use: No     Allergies   Sulfa antibiotics and Vicodin [hydrocodone-acetaminophen]   Review of Systems Review of Systems  Constitutional:  Negative for chills and fever.  Eyes:  Negative for discharge and redness.   Musculoskeletal:  Positive for arthralgias and joint swelling.  Skin:  Negative for color change and wound.  Neurological:  Negative for numbness.     Physical Exam Triage Vital Signs ED Triage Vitals  Enc Vitals Group     BP 12/09/21 1004 120/82     Pulse Rate 12/09/21 1004 73     Resp 12/09/21 1004 17     Temp 12/09/21 1007 98 F (36.7 C)     Temp Source 12/09/21 1007 Oral     SpO2 12/09/21 1004 96 %     Weight --      Height --  Head Circumference --      Peak Flow --      Pain Score 12/09/21 1006 9     Pain Loc --      Pain Edu? --      Excl. in [place]? --    No data found.  Updated Vital Signs BP 120/82 (BP Location: Right Arm)   Pulse 73   Temp 98 F (36.7 C) (Oral)   Resp 17   SpO2 96%     Physical Exam Vitals and nursing note reviewed.  Constitutional:      General: He is not in acute distress.    Appearance: Normal appearance. He is not ill-appearing.  HENT:     Head: Normocephalic and atraumatic.  Eyes:     Conjunctiva/sclera: Conjunctivae normal.  Cardiovascular:     Rate and Rhythm: Normal rate.  Pulmonary:     Effort: Pulmonary effort is normal.  Musculoskeletal:     Comments: Diffuse swelling to proximal dorsal right foot, TTP noted to same  Skin:    General: Skin is warm and dry.     Capillary Refill: Cap refill to toes.  Neurological:     Mental Status: He is alert.     Comments: Gross sensation intact to distal right toes.   Psychiatric:        Mood and Affect: Mood normal.        Behavior: Behavior normal.        Thought Content: Thought content normal.      UC Treatments / Results  Labs (all labs ordered are listed, but only abnormal results are displayed) Labs Reviewed - No data to display  EKG   Radiology DG Foot Complete Right  Result Date: 12/09/2021 CLINICAL DATA:  Injury EXAM: RIGHT FOOT COMPLETE - 3+ VIEW COMPARISON:  None Available. FINDINGS: Small fragment of bone is present dorsal to the mid fluid, possibly  the navicular. Additional 2 small fragments of bone are present dorsal to the talus. These are only evident on the lateral view. Joint spaces are preserved. IMPRESSION: Small fractures at the dorsal aspect of the talus and probably the navicular on the lateral view. Electronically Signed   By: Macy Mis M.D.   On: 12/09/2021 10:33    Procedures Procedures (including critical care time)  Medications Ordered in UC Medications - No data to display  Initial Impression / Assessment and Plan / UC Course  I have reviewed the triage vital signs and the nursing notes.  Pertinent labs & imaging results that were available during my care of the patient were reviewed by me and considered in my medical decision making (see chart for details).    CAM boot provided in office- recommended follow up with ortho asap. Offered toradol injection which patient kindly declines.   Final Clinical Impressions(s) / UC Diagnoses   Final diagnoses:  Displaced avulsion fracture (chip fracture) of right talus, initial encounter for closed fracture   Discharge Instructions   None    ED Prescriptions   None    PDMP not reviewed this encounter.   Francene Finders, PA-C 12/09/21 1100

## 2021-12-11 ENCOUNTER — Ambulatory Visit (INDEPENDENT_AMBULATORY_CARE_PROVIDER_SITE_OTHER): Payer: 59

## 2021-12-11 ENCOUNTER — Ambulatory Visit: Payer: 59 | Admitting: Podiatry

## 2021-12-11 DIAGNOSIS — S92101A Unspecified fracture of right talus, initial encounter for closed fracture: Secondary | ICD-10-CM | POA: Diagnosis not present

## 2021-12-11 NOTE — Progress Notes (Signed)
Chief Complaint  Patient presents with   right foot injury    Patient  dropped couch on right foot on this past Sunday evening, [facility],patient was not advised on if he needed to sleepin the boot given at the urgent care.    HPI: 61 y.o. male presenting today as a reestablish patient for evaluation of pain and tenderness to the dorsum of the right foot after dropping a couch on his foot Sunday evening.  He went to [facility] urgent care and was diagnosed with talar fracture and given a boot.  He presents today for further treatment and evaluation.  Patient states over the last few days he has noticed significant improvement  Past Medical History:  Diagnosis Date   Anginal pain ([place])    Chest pain 02/13/2012   echo - EF >55%;mod concentric L ventricular hypertrophy; atrial septum aneurysmal; patent foramen ovale suspected on color flow, LA mil/mod dilation; RV systolic pressure [id number]   Chest pain 06/09/2007   echo - EF 45-50%; interarterial shunt; mild valvular regurgitation;    Chest pain 06/09/2007   Myoview - EF 53%; normal perfusion all regions;EKG negative for ischemia    Chronic kidney disease    Diabetes mellitus    type II   DIABETES MELLITUS, TYPE II 08/29/2009   GERD (gastroesophageal reflux disease) 08/12/2010   Hemorrhoids    History of anal fissures    Hyperlipidemia    HYPERLIPIDEMIA 08/29/2009   Hypertension    HYPERTENSION 01/14/2008   Lightheadedness 05/10/2002   30d monitor unremarkable   LVH (left ventricular hypertrophy)    with systolic dysfunction by echo 10/03, EF 45-50% - Dr. Melvern Banker   Multinodular goiter 12/06/2015   Nonspecific ST-T wave electrocardiographic changes 08/11/2006   Myoview - EF 49%; normal perfusion all regions, EKG negative for ischemia   OSA (obstructive sleep apnea)    Preventative health care 08/11/2010   Sleep apnea 08/13/2008   AHI 1.2; sleep study 10/2007 - AHI 9.47at 15cm H2O and 31.3 at [place], OBSTRUCTIVE 01/14/2008    Past Surgical History:  Procedure Laterality Date   ANAL SPHINCTEROTOMY  08/20/11   CARDIAC CATHETERIZATION  10/23/2008   normal coronary arteries and LV function   COLONOSCOPY     IR RADIOLOGIST EVAL & MGMT  04/12/2021   IR RADIOLOGIST EVAL & MGMT  08/13/2021   IR RADIOLOGIST EVAL & MGMT  09/11/2021   IR RADIOLOGIST EVAL & MGMT  12/04/2021   POLYPECTOMY     RADIOLOGY WITH ANESTHESIA N/A 05/15/2021   Procedure: MICROWAVE ABLATION;  Surgeon: Michaelle Birks, MD;  [facility];  Service: Radiology;  Laterality: N/A;   RADIOLOGY WITH ANESTHESIA Right 11/06/2021   Procedure: IR WITH ANESTHESIA RIGHT RENAL CRYOABLATION;  Surgeon: Michaelle Birks, MD;  [facility];  Service: Radiology;  Laterality: Right;    Allergies  Allergen Reactions   Sulfa Antibiotics Hives and Swelling   Vicodin [Hydrocodone-Acetaminophen] Itching    Patient states he can take Tylenol without difficulty.     Physical Exam: General: The patient is alert and oriented x3 in no acute distress.  Dermatology: Skin is warm, dry and supple bilateral lower extremities. Negative for open lesions or macerations.  Vascular: Palpable pedal pulses bilaterally. Capillary refill within normal limits.  No erythema.  Mild to moderate edema noted.  Neurological: Light touch and protective threshold grossly intact  Musculoskeletal Exam: No pedal deformities noted.  Mild tenderness throughout palpation  of the foot.  Radiographic Exam 12/11/2021:  Unchanged from prior x-rays taken 12/09/2021. IMPRESSION: Small fractures at the dorsal aspect of the talus and probably the navicular on the lateral view.  Assessment: 1.  Fracture talus right, nondisplaced, closed, initial encounter   Plan of Care:  1. Patient evaluated. X-Rays reviewed.  2.  Order placed for CT for better imaging 3.  Patient states that he is feeling much better.  He is weightbearing in the cam boot.  Continue. 4.  Compression  ankle sleeves dispensed.  Wear daily 5.  Will contact patient after CT is completed to discuss the results over the phone and discuss return to work date.  Patient is anxious to get back to work      Edrick Kins, DPM [facility]  Dr. Edrick Kins, DPM    [address], [place] [address]                Office [phone]  Fax [phone]

## 2021-12-12 ENCOUNTER — Other Ambulatory Visit: Payer: Self-pay | Admitting: Internal Medicine

## 2021-12-12 NOTE — Telephone Encounter (Signed)
Please refill as per office routine med refill policy (all routine meds to be refilled for 3 mo or monthly (per pt preference) up to one year from last visit, then month to month grace period for 3 mo, then further med refills will have to be denied) ? ?

## 2021-12-13 ENCOUNTER — Ambulatory Visit
Admission: RE | Admit: 2021-12-13 | Discharge: 2021-12-13 | Disposition: A | Discharge status: Home / Self Care | Payer: 59 | Source: Ambulatory Visit | Attending: Podiatry | Admitting: Podiatry

## 2021-12-13 DIAGNOSIS — S92101A Unspecified fracture of right talus, initial encounter for closed fracture: Secondary | ICD-10-CM

## 2021-12-16 NOTE — Progress Notes (Signed)
Spoke with patient.  Discussed findings of the CT.  No fractures or dislocations.  Patient may return to work.  Return to clinic as needed.

## 2021-12-22 ENCOUNTER — Other Ambulatory Visit: Payer: Self-pay | Admitting: Internal Medicine

## 2021-12-25 ENCOUNTER — Other Ambulatory Visit: Payer: Self-pay | Admitting: Internal Medicine

## 2021-12-25 NOTE — Telephone Encounter (Signed)
Please refill as per office routine med refill policy (all routine meds to be refilled for 3 mo or monthly (per pt preference) up to one year from last visit, then month to month grace period for 3 mo, then further med refills will have to be denied) ? ?

## 2022-01-01 ENCOUNTER — Other Ambulatory Visit: Payer: Self-pay | Admitting: Internal Medicine

## 2022-01-01 NOTE — Telephone Encounter (Signed)
Please refill as per office routine med refill policy (all routine meds to be refilled for 3 mo or monthly (per pt preference) up to one year from last visit, then month to month grace period for 3 mo, then further med refills will have to be denied) ? ?

## 2022-01-03 ENCOUNTER — Other Ambulatory Visit: Payer: Self-pay | Admitting: Internal Medicine

## 2022-01-08 ENCOUNTER — Other Ambulatory Visit: Payer: Self-pay | Admitting: Interventional Radiology

## 2022-01-08 DIAGNOSIS — N2889 Other specified disorders of kidney and ureter: Secondary | ICD-10-CM

## 2022-01-19 ENCOUNTER — Other Ambulatory Visit: Payer: Self-pay | Admitting: Internal Medicine

## 2022-01-19 NOTE — Telephone Encounter (Signed)
Please refill as per office routine med refill policy (all routine meds to be refilled for 3 mo or monthly (per pt preference) up to one year from last visit, then month to month grace period for 3 mo, then further med refills will have to be denied) ? ?

## 2022-01-23 ENCOUNTER — Other Ambulatory Visit: Payer: Self-pay | Admitting: Internal Medicine

## 2022-01-23 NOTE — Telephone Encounter (Signed)
Please refill as per office routine med refill policy (all routine meds to be refilled for 3 mo or monthly (per pt preference) up to one year from last visit, then month to month grace period for 3 mo, then further med refills will have to be denied) ? ?

## 2022-01-25 ENCOUNTER — Other Ambulatory Visit: Payer: Self-pay | Admitting: Internal Medicine

## 2022-02-06 ENCOUNTER — Ambulatory Visit
Admission: RE | Admit: 2022-02-06 | Discharge: 2022-02-06 | Disposition: A | Discharge status: Home / Self Care | Payer: 59 | Source: Ambulatory Visit | Attending: Urology | Admitting: Urology

## 2022-02-06 DIAGNOSIS — D49511 Neoplasm of unspecified behavior of right kidney: Secondary | ICD-10-CM

## 2022-02-07 ENCOUNTER — Other Ambulatory Visit: Payer: 59

## 2022-02-15 ENCOUNTER — Other Ambulatory Visit: Payer: Self-pay | Admitting: Internal Medicine

## 2022-02-23 ENCOUNTER — Ambulatory Visit: Admission: RE | Admit: 2022-02-23 | Payer: 59 | Source: Ambulatory Visit

## 2022-02-27 ENCOUNTER — Other Ambulatory Visit: Payer: Self-pay | Admitting: Internal Medicine

## 2022-03-03 ENCOUNTER — Other Ambulatory Visit: Payer: Self-pay | Admitting: Internal Medicine

## 2022-03-05 ENCOUNTER — Ambulatory Visit
Admission: RE | Admit: 2022-03-05 | Discharge: 2022-03-05 | Disposition: A | Discharge status: Home / Self Care | Payer: 59 | Source: Ambulatory Visit | Attending: Urology | Admitting: Urology

## 2022-03-05 DIAGNOSIS — D49511 Neoplasm of unspecified behavior of right kidney: Secondary | ICD-10-CM

## 2022-03-05 MED ORDER — GADOPICLENOL 0.5 MMOL/ML IV SOLN
7.0000 mL | Freq: Once | INTRAVENOUS | Status: AC | PRN
  Administered 2022-03-05: 7 mL via INTRAVENOUS

## 2022-03-06 ENCOUNTER — Ambulatory Visit
Admission: RE | Admit: 2022-03-06 | Discharge: 2022-03-06 | Disposition: A | Discharge status: Home / Self Care | Payer: 59 | Source: Ambulatory Visit | Attending: Interventional Radiology | Admitting: Interventional Radiology

## 2022-03-06 DIAGNOSIS — N2889 Other specified disorders of kidney and ureter: Secondary | ICD-10-CM

## 2022-03-06 HISTORY — PX: IR RADIOLOGIST EVAL & MGMT: IMG5224

## 2022-03-06 NOTE — Progress Notes (Signed)
Visit Reason: Imaging follow up s/p R renal mass ablation   Care Team: Janith Lima MD; Urology Biagio Borg, MD; PCP   Virtual Visit via Telephone Note   I connected with [name] on 03/06/22 by telephone and verified that I am speaking with the correct person using two identifiers.   I discussed the limitations, risks, security and privacy concerns of performing an evaluation and management service by telephone and the availability of in-person appointments.    History of Present Illness:   [name] is a 61 y.o. male well known to me s/p prior ablation of L renal mass c/w papillary RCC. Pt had originally presented to ER w fatigue in 01/2021 and was found to have an AKI. Diagnostic workup included a renal US which identified an incidental 2.1 cm R renal cystic lesion for which followup was recommended with MRI, and deemed Bosniak IIF (02/2021). An incidental, contralateral 1 cm left upper pole mass had imaging characteristics consistent with aforementioned papillary RCC. He underwent successful and uneventful L renal mass microwave ablation by me on 05/15/21. On imaging follow up, the R renal mass was upgraded ofrom Bosniak IIF to III. He is followed closely by Urology, Dr Abner Greenspan, and was re-referred to me for a minimally invasive treatment option of this lesion. He underwent biopsy and cryoablation of complex cystic R renal mass on 11/06/21. Procedure was minimally complicated by post op hematoma, managed conservatively. Pt was discharged on POD 1 without event. 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   He is seen back in clinic for 3 month follow up MR, this is his first imaging evaluation since the R renal mass ablation. He reports that remains at his baseline health. He does endorse mild claustrophobia and anxiety associated with the small bore of the MRI, which required anxiolytics to help him calm down enough.    Review of Systems: A 12-point ROS discussed, and  pertinent positives are indicated in the HPI above.  All other systems are negative.   Past Medical History:  Diagnosis Date   Anginal pain ([place])    Chest pain 02/13/2012   echo - EF >55%;mod concentric L ventricular hypertrophy; atrial septum aneurysmal; patent foramen ovale suspected on color flow, LA mil/mod dilation; RV systolic pressure 24QAST   Chest pain 06/09/2007   echo - EF 45-50%; interarterial shunt; mild valvular regurgitation;    Chest pain 06/09/2007   Myoview - EF 53%; normal perfusion all regions;EKG negative for ischemia    Chronic kidney disease    Diabetes mellitus    type II   DIABETES MELLITUS, TYPE II 08/29/2009   GERD (gastroesophageal reflux disease) 08/12/2010   Hemorrhoids    History of anal fissures    Hyperlipidemia    HYPERLIPIDEMIA 08/29/2009   Hypertension    HYPERTENSION 01/14/2008   Lightheadedness 05/10/2002   30d monitor unremarkable   LVH (left ventricular hypertrophy)    with systolic dysfunction by echo 10/03, EF 45-50% - Dr. Melvern Banker   Multinodular goiter 12/06/2015   Nonspecific ST-T wave electrocardiographic changes 08/11/2006   Myoview - EF 49%; normal perfusion all regions, EKG negative for ischemia   OSA (obstructive sleep apnea)    Preventative health care 08/11/2010   Sleep apnea 08/13/2008   AHI 1.2; sleep study 10/2007 - AHI 9.47at 15cm H2O and 31.3 at [place], OBSTRUCTIVE 01/14/2008    Past Surgical History:  Procedure Laterality Date   ANAL SPHINCTEROTOMY  08/20/11   CARDIAC  CATHETERIZATION  10/23/2008   normal coronary arteries and LV function   COLONOSCOPY     IR RADIOLOGIST EVAL & MGMT  04/12/2021   IR RADIOLOGIST EVAL & MGMT  08/13/2021   IR RADIOLOGIST EVAL & MGMT  09/11/2021   IR RADIOLOGIST EVAL & MGMT  12/04/2021   POLYPECTOMY     RADIOLOGY WITH ANESTHESIA N/A 05/15/2021   Procedure: MICROWAVE ABLATION;  Surgeon: Michaelle Birks, MD;  Location: [facility];  Service: Radiology;  Laterality: N/A;   RADIOLOGY WITH  ANESTHESIA Right 11/06/2021   Procedure: IR WITH ANESTHESIA RIGHT RENAL CRYOABLATION;  Surgeon: Michaelle Birks, MD;  Location: [facility];  Service: Radiology;  Laterality: Right;    Allergies: Sulfa antibiotics and Vicodin [hydrocodone-acetaminophen]  Medications: Prior to Admission medications   Medication Sig Start Date End Date Taking? Authorizing Provider  amLODipine-benazepril (LOTREL) 10-40 MG capsule TAKE 1 CAPSULE BY MOUTH EVERY DAY 01/25/22   Biagio Borg, MD  Blood Glucose Monitoring Suppl (ONE TOUCH ULTRA 2) w/Device KIT Use as directed  E11.9 12/11/16   Biagio Borg, MD  hydrALAZINE (APRESOLINE) 50 MG tablet Take 1 tablet (50 mg total) by mouth 3 (three) times daily. 09/26/21   Biagio Borg, MD  hydrochlorothiazide (HYDRODIURIL) 25 MG tablet TAKE 1 TABLET (25 MG TOTAL) BY MOUTH DAILY. 01/03/22   Biagio Borg, MD  JANUVIA 100 MG tablet TAKE 1 TABLET BY MOUTH EVERY DAY 11/25/21   Biagio Borg, MD  KLOR-CON M10 10 MEQ tablet TAKE 1 TABLET BY MOUTH EVERY DAY *NEED APPOINTMENT FOR REFILLS* 03/03/22   Biagio Borg, MD  Lancets MISC Use as directed E11.9 12/11/16   Biagio Borg, MD  metFORMIN (GLUCOPHAGE-XR) 500 MG 24 hr tablet TAKE 2 TABLETS BY MOUTH EVERY DAY WITH BREAKFAST 12/26/21   Biagio Borg, MD  nebivolol (BYSTOLIC) 10 MG tablet TAKE 2 TABLETS BY MOUTH EVERY DAY 02/16/22   Biagio Borg, MD  omeprazole (PRILOSEC) 40 MG capsule TAKE 1 CAPSULE BY MOUTH EVERY DAY 12/12/21   Biagio Borg, MD  pravastatin (PRAVACHOL) 80 MG tablet TAKE 1 TABLET BY MOUTH EVERY DAY 11/25/21   Biagio Borg, MD     Family History  Problem Relation Age of Onset   Hypertension Mother    Heart disease Father    Colon cancer Neg Hx    Thyroid disease Neg Hx     Social History   Socioeconomic History   Marital status: Single    Spouse name: Not on file   Number of children: 1   Years of education: Not on file   Highest education level: Not on file  Occupational History   Occupation: work as truck  Education administrator: [employer]  Tobacco Use   Smoking status: Every Day    Packs/day: 0.50    Years: 30.00    Total pack years: 15.00    Types: Cigarettes   Smokeless tobacco: Never  Vaping Use   Vaping Use: Never used  Substance and Sexual Activity   Alcohol use: Yes    Alcohol/week: 3.0 standard drinks of alcohol    Types: 3 Standard drinks or equivalent per week    Comment: occaisionally   Drug use: No   Sexual activity: Not on file  Other Topics Concern   Not on file  Social History Narrative   Not on file   Social Determinants of Health   Financial Resource Strain: Not on file  Food Insecurity: Not on  file  Transportation Needs: Not on file  Physical Activity: Not on file  Stress: Not on file  Social Connections: Not on file     Vital Signs: Deferred secondary to virtual visit.  Physical Exam Deferred secondary to virtual visit.  Imaging:  IR R renal mass cryoablation: 11/06/21 Adequate R complex cystic renal mass targeting and ablation.   Follow up MR Abdomen, 03/05/22 No evidence of recurrent or residual enhancement.    MR ABDOMEN W WO CONTRAST  Result Date: 03/06/2022 CLINICAL DATA:  A 61 year old male presents for follow-up of renal cell carcinoma post cryoablation. EXAM: MRI ABDOMEN WITHOUT AND WITH CONTRAST TECHNIQUE: Multiplanar multisequence MR imaging of the abdomen was performed both before and after the administration of intravenous contrast. CONTRAST:  7 mL Vueway COMPARISON:  Multiple prior studies most recent from Aug 08, 2021 and from November 06, 2021. FINDINGS: Lower chest: Incidental imaging of the lung bases is unremarkable on MRI. Hepatobiliary: Hepatic cysts. Smooth hepatic contours. Patent portal vein. No pericholecystic stranding or signs of biliary duct dilation. Mild sludge in the gallbladder. Pancreas: Normal intrinsic T1 signal. No ductal dilation or sign of inflammation. No focal lesion. Spleen:  Normal. Adrenals/Urinary Tract:   Adrenal glands are normal. RIGHT kidney: In the inter-lower pole of the RIGHT kidney there is a 2 x 1.4 x 2.4 cm area of mixed signal on T1 and T2 weighted imaging. This displays baseline intrinsic T1 hyperintensity. Defect situated just at and slightly below the inferior hilar line along the anterior surface of the kidney at the site of previous cystic neoplasm. Study is mildly motion degraded on early arterial phase but less degraded on later phase is displaying no definitive evidence of nodular enhancement either measurable or on subtraction imaging. There is a thin rim of enhancement surrounding internal hemorrhagic debris. No additional suspicious renal lesions on the RIGHT. No hydronephrosis. No perinephric stranding. LEFT kidney: Defect in the upper pole the LEFT kidney is nonenhancing and displays classic changes of post ablation related changes along the posterior upper pole. No signs of disease recurrence. No additional renal lesions. No hydronephrosis or perinephric stranding. Stomach/Bowel: No acute gastrointestinal findings to the extent evaluated on this abdominal MRI. Vascular/Lymphatic: No pathologically enlarged lymph nodes identified. No abdominal aortic aneurysm demonstrated. Other:  None. Musculoskeletal: No suspicious bone lesions identified. IMPRESSION: 1. Post cryoablation changes in the interpolar-lower pole anterolateral RIGHT kidney at the lower hilar line without signs of residual or recurrent disease at this time, findings fall into the range of expected post cryoablation changes with hemorrhagic debris and thin rim of internal presumed fibrotic enhancement. Continued attention on subsequent imaging is suggested. 2. Post cryoablation in the LEFT kidney in the upper pole as well without signs of residual or recurrent disease. 3. No new renal lesions or evidence of metastasis in the abdomen. 4. Mild sludge in the gallbladder. Electronically Signed   By: Zetta Bills M.D.   On: 03/06/2022  08:38      Labs:  CBC: Recent Labs    04/30/21 1330 05/16/21 0410 10/29/21 0817 11/07/21 0344  WBC 7.2 10.6* 5.1 10.3  HGB 12.4* 13.0 13.1 11.9*  HCT 36.4* 37.6* 37.9* 34.3*  PLT 221 220 235 194    COAGS: Recent Labs    05/15/21 1015 10/29/21 0817  INR 1.0 1.0    BMP: Recent Labs    05/15/21 1015 05/16/21 0410 10/29/21 0817 11/07/21 0344  NA 138 135 143 137  K 3.1* 3.3* 3.3* 3.2*  CL 100  100 105 100  CO2 _0 GLUCOSE 104* 198* 91 151*  BUN 19 29* 18 24*  CALCIUM 9.7 8.9 9.3 8.8*  CREATININE 1.26* 1.84* 1.38* 1.65*  GFRNONAA >60 41* 58* 47*    Assessment and Plan:  61 y/o M well known to me s/p MWA of a small L renal pRCC on 05/15/21 and more recently cryoablation of complex cystic renal mass on 11/06/21.  Pathology confirmatory for R ccRCC. 3 mos follow up MR without evidence of residual or recurrent disease.    *Aqeduate lesional coverage. Evolving post ablation change without concern at this time. *6 mos follow up MR Abdomen W/WO, i.e. 9 mos post ablation (11/06/21). *RTC (virtual) for imaging review in 6 mos. *Pt will also follow up with his Urologist, Dr. Rexene Alberts    Thank you for allowing [place] to participate in the care of this Patient. Please contact me with questions, concerns, or if new issues arise.  Electronically Signed:  Michaelle Birks, MD [facility]   Pager. [phone] Clinic. [phone]   I spent a total of 30 Minutes of non-face-to-face time in clinical consultation, greater than 50% of which was counseling/coordinating care for follow up management s/p ablation of R renal cell carcinoma.

## 2022-03-11 ENCOUNTER — Other Ambulatory Visit: Payer: Self-pay | Admitting: Internal Medicine

## 2022-03-11 NOTE — Telephone Encounter (Signed)
Please refill as per office routine med refill policy (all routine meds to be refilled for 3 mo or monthly (per pt preference) up to one year from last visit, then month to month grace period for 3 mo, then further med refills will have to be denied) ? ?

## 2022-03-20 ENCOUNTER — Ambulatory Visit (INDEPENDENT_AMBULATORY_CARE_PROVIDER_SITE_OTHER): Payer: 59 | Admitting: Internal Medicine

## 2022-03-20 VITALS — BP 114/70 | HR 72 | Temp 98.1°F | Ht 73.0 in | Wt 175.0 lb

## 2022-03-20 DIAGNOSIS — Z86010 Personal history of colonic polyps: Secondary | ICD-10-CM

## 2022-03-20 DIAGNOSIS — Z0001 Encounter for general adult medical examination with abnormal findings: Secondary | ICD-10-CM

## 2022-03-20 DIAGNOSIS — Z794 Long term (current) use of insulin: Secondary | ICD-10-CM | POA: Diagnosis not present

## 2022-03-20 DIAGNOSIS — E0800 Diabetes mellitus due to underlying condition with hyperosmolarity without nonketotic hyperglycemic-hyperosmolar coma (NKHHC): Secondary | ICD-10-CM | POA: Diagnosis not present

## 2022-03-20 DIAGNOSIS — E538 Deficiency of other specified B group vitamins: Secondary | ICD-10-CM | POA: Diagnosis not present

## 2022-03-20 DIAGNOSIS — M109 Gout, unspecified: Secondary | ICD-10-CM | POA: Diagnosis not present

## 2022-03-20 DIAGNOSIS — Z23 Encounter for immunization: Secondary | ICD-10-CM

## 2022-03-20 DIAGNOSIS — E0590 Thyrotoxicosis, unspecified without thyrotoxic crisis or storm: Secondary | ICD-10-CM | POA: Diagnosis not present

## 2022-03-20 DIAGNOSIS — I10 Essential (primary) hypertension: Secondary | ICD-10-CM

## 2022-03-20 DIAGNOSIS — Z125 Encounter for screening for malignant neoplasm of prostate: Secondary | ICD-10-CM

## 2022-03-20 DIAGNOSIS — E782 Mixed hyperlipidemia: Secondary | ICD-10-CM

## 2022-03-20 DIAGNOSIS — E559 Vitamin D deficiency, unspecified: Secondary | ICD-10-CM | POA: Diagnosis not present

## 2022-03-20 DIAGNOSIS — Z1211 Encounter for screening for malignant neoplasm of colon: Secondary | ICD-10-CM

## 2022-03-20 DIAGNOSIS — Z720 Tobacco use: Secondary | ICD-10-CM

## 2022-03-20 LAB — URINALYSIS, ROUTINE W REFLEX MICROSCOPIC
Bilirubin Urine: NEGATIVE
Hgb urine dipstick: NEGATIVE
Ketones, ur: NEGATIVE
Leukocytes,Ua: NEGATIVE
Nitrite: NEGATIVE
RBC / HPF: NONE SEEN (ref 0–?)
Specific Gravity, Urine: 1.02 (ref 1.000–1.030)
Urine Glucose: NEGATIVE
Urobilinogen, UA: 0.2 (ref 0.0–1.0)
pH: 6 (ref 5.0–8.0)

## 2022-03-20 LAB — CBC WITH DIFFERENTIAL/PLATELET
Basophils Absolute: 0 10*3/uL (ref 0.0–0.1)
Basophils Relative: 0.4 % (ref 0.0–3.0)
Eosinophils Absolute: 0.2 10*3/uL (ref 0.0–0.7)
Eosinophils Relative: 3.5 % (ref 0.0–5.0)
HCT: 40.2 % (ref 39.0–52.0)
Hemoglobin: 13.5 g/dL (ref 13.0–17.0)
Lymphocytes Relative: 27.3 % (ref 12.0–46.0)
Lymphs Abs: 1.8 10*3/uL (ref 0.7–4.0)
MCHC: 33.6 g/dL (ref 30.0–36.0)
MCV: 98.1 fl (ref 78.0–100.0)
Monocytes Absolute: 1 10*3/uL (ref 0.1–1.0)
Monocytes Relative: 15.5 % — ABNORMAL HIGH (ref 3.0–12.0)
Neutro Abs: 3.4 10*3/uL (ref 1.4–7.7)
Neutrophils Relative %: 53.3 % (ref 43.0–77.0)
Platelets: 256 10*3/uL (ref 150.0–400.0)
RBC: 4.1 Mil/uL — ABNORMAL LOW (ref 4.22–5.81)
RDW: 13.7 % (ref 11.5–15.5)
WBC: 6.5 10*3/uL (ref 4.0–10.5)

## 2022-03-20 LAB — TSH: TSH: 0.42 u[IU]/mL (ref 0.35–5.50)

## 2022-03-20 LAB — MICROALBUMIN / CREATININE URINE RATIO
Creatinine,U: 133.6 mg/dL
Microalb Creat Ratio: 3.4 mg/g (ref 0.0–30.0)
Microalb, Ur: 4.5 mg/dL — ABNORMAL HIGH (ref 0.0–1.9)

## 2022-03-20 LAB — BASIC METABOLIC PANEL
BUN: 26 mg/dL — ABNORMAL HIGH (ref 6–23)
CO2: 31 mEq/L (ref 19–32)
Calcium: 10 mg/dL (ref 8.4–10.5)
Chloride: 101 mEq/L (ref 96–112)
Creatinine, Ser: 1.55 mg/dL — ABNORMAL HIGH (ref 0.40–1.50)
GFR: 47.56 mL/min — ABNORMAL LOW (ref >60.00)
Glucose, Bld: 98 mg/dL (ref 70–99)
Potassium: 3.5 mEq/L (ref 3.5–5.1)
Sodium: 142 mEq/L (ref 135–145)

## 2022-03-20 LAB — LIPID PANEL
Cholesterol: 164 mg/dL (ref 0–200)
HDL: 57.7 mg/dL (ref >39.00)
LDL Cholesterol: 69 mg/dL (ref 0–99)
NonHDL: 105.84
Total CHOL/HDL Ratio: 3
Triglycerides: 185 mg/dL — ABNORMAL HIGH (ref 0.0–149.0)
VLDL: 37 mg/dL (ref 0.0–40.0)

## 2022-03-20 LAB — HEPATIC FUNCTION PANEL
ALT: 9 U/L (ref 0–53)
AST: 14 U/L (ref 0–37)
Albumin: 4.7 g/dL (ref 3.5–5.2)
Alkaline Phosphatase: 74 U/L (ref 39–117)
Bilirubin, Direct: 0.1 mg/dL (ref 0.0–0.3)
Total Bilirubin: 0.5 mg/dL (ref 0.2–1.2)
Total Protein: 7.4 g/dL (ref 6.0–8.3)

## 2022-03-20 LAB — VITAMIN B12: Vitamin B-12: 439 pg/mL (ref 211–911)

## 2022-03-20 LAB — HEMOGLOBIN A1C: Hgb A1c MFr Bld: 6.1 % (ref 4.6–6.5)

## 2022-03-20 LAB — T4, FREE: Free T4: 0.78 ng/dL (ref 0.60–1.60)

## 2022-03-20 LAB — VITAMIN D 25 HYDROXY (VIT D DEFICIENCY, FRACTURES): VITD: 25.21 ng/mL — ABNORMAL LOW (ref 30.00–100.00)

## 2022-03-20 LAB — URIC ACID: Uric Acid, Serum: 8.8 mg/dL — ABNORMAL HIGH (ref 4.0–7.8)

## 2022-03-20 LAB — PSA: PSA: 0.52 ng/mL (ref 0.10–4.00)

## 2022-03-20 MED ORDER — PRAVASTATIN SODIUM 80 MG PO TABS: 80.0000 mg | ORAL_TABLET | Freq: Every day | ORAL | 3 refills | Status: DC

## 2022-03-20 NOTE — Progress Notes (Signed)
Patient ID: [name], male   DOB: [birth year], 61 y.o.   MRN: [id number]         Chief Complaint:: wellness exam and DM, htn, hld, hx of hyperthyroidism, smoker       HPI:  [name] is a 61 y.o. male here for wellness exam; due for shingrix #2, plans to clall himself for optho appt soon, will refer for colonoscopy, declines covid booster, o/w up to date                        Also No longer seeing Dr Loanne Drilling endo as is retired.  Pt denies chest pain, increased sob or doe, wheezing, orthopnea, PND, increased LE swelling, palpitations, dizziness or syncope.   Pt denies polydipsia, polyuria, or new focal neuro s/s.    Pt denies fever, wt loss, night sweats, loss of appetite, or other constitutional symptoms  Denies hyper or hypo thyroid symptoms such as voice, skin or hair change.  Still smoking, not ready to quit Wt Readings from Last 3 Encounters:  03/20/22 175 lb (79.4 kg)  11/06/21 169 lb 15.6 oz (77.1 kg)  10/29/21 170 lb (77.1 kg)   BP Readings from Last 3 Encounters:  03/20/22 114/70  12/09/21 120/82  11/07/21 123/76   Immunization History  Administered Date(s) Administered   Influenza,inj,Quad PF,6+ Mos 12/11/2016, 12/10/2017, 11/26/2018   Influenza-Unspecified 01/15/2015, 01/17/2020, 02/10/2022   Moderna Sars-Covid-2 Vaccination 05/30/2019, 06/30/2019   Pneumococcal Conjugate-13 09/11/2020   Pneumococcal Polysaccharide-23 11/21/2014, 12/18/2015   Td 08/29/2009   Tdap 12/06/2013   Zoster Recombinat (Shingrix) 09/11/2020, 03/20/2022   Health Maintenance Due  Topic Date Due   OPHTHALMOLOGY EXAM  03/27/2020      Past Medical History:  Diagnosis Date   Anginal pain ([place])    Chest pain 02/13/2012   echo - EF >55%;mod concentric L ventricular hypertrophy; atrial septum aneurysmal; patent foramen ovale suspected on color flow, LA mil/mod dilation; RV systolic pressure 95GLOV   Chest pain 06/09/2007   echo - EF 45-50%; interarterial shunt; mild valvular  regurgitation;    Chest pain 06/09/2007   Myoview - EF 53%; normal perfusion all regions;EKG negative for ischemia    Chronic kidney disease    Diabetes mellitus    type II   DIABETES MELLITUS, TYPE II 08/29/2009   GERD (gastroesophageal reflux disease) 08/12/2010   Hemorrhoids    History of anal fissures    Hyperlipidemia    HYPERLIPIDEMIA 08/29/2009   Hypertension    HYPERTENSION 01/14/2008   Lightheadedness 05/10/2002   30d monitor unremarkable   LVH (left ventricular hypertrophy)    with systolic dysfunction by echo 10/03, EF 45-50% - Dr. Melvern Banker   Multinodular goiter 12/06/2015   Nonspecific ST-T wave electrocardiographic changes 08/11/2006   Myoview - EF 49%; normal perfusion all regions, EKG negative for ischemia   OSA (obstructive sleep apnea)    Preventative health care 08/11/2010   Sleep apnea 08/13/2008   AHI 1.2; sleep study 10/2007 - AHI 9.47at 15cm H2O and 31.3 at [place], OBSTRUCTIVE 01/14/2008   Past Surgical History:  Procedure Laterality Date   ANAL SPHINCTEROTOMY  08/20/11   CARDIAC CATHETERIZATION  10/23/2008   normal coronary arteries and LV function   COLONOSCOPY     IR RADIOLOGIST EVAL & MGMT  04/12/2021   IR RADIOLOGIST EVAL & MGMT  08/13/2021   IR RADIOLOGIST EVAL & MGMT  09/11/2021   IR RADIOLOGIST EVAL & MGMT  12/04/2021  IR RADIOLOGIST EVAL & MGMT  03/06/2022   POLYPECTOMY     RADIOLOGY WITH ANESTHESIA N/A 05/15/2021   Procedure: MICROWAVE ABLATION;  Surgeon: Michaelle Birks, MD;  Location: [facility];  Service: Radiology;  Laterality: N/A;   RADIOLOGY WITH ANESTHESIA Right 11/06/2021   Procedure: IR WITH ANESTHESIA RIGHT RENAL CRYOABLATION;  Surgeon: Michaelle Birks, MD;  Location: [facility];  Service: Radiology;  Laterality: Right;    reports that he has been smoking cigarettes. He has a 15.00 pack-year smoking history. He has never used smokeless tobacco. He reports current alcohol use of about 3.0 standard drinks of alcohol per week. He reports that  he does not use drugs. family history includes Heart disease in his father; Hypertension in his mother. Allergies  Allergen Reactions   Sulfa Antibiotics Hives and Swelling   Vicodin [Hydrocodone-Acetaminophen] Itching    Patient states he can take Tylenol without difficulty.   Current Outpatient Medications on File Prior to Visit  Medication Sig Dispense Refill   amLODipine-benazepril (LOTREL) 10-40 MG capsule TAKE 1 CAPSULE BY MOUTH EVERY DAY 90 capsule 0   Blood Glucose Monitoring Suppl (ONE TOUCH ULTRA 2) w/Device KIT Use as directed  E11.9 1 each 0   hydrALAZINE (APRESOLINE) 50 MG tablet Take 1 tablet (50 mg total) by mouth 3 (three) times daily. 270 tablet 1   hydrochlorothiazide (HYDRODIURIL) 25 MG tablet TAKE 1 TABLET (25 MG TOTAL) BY MOUTH DAILY. 90 tablet 1   JANUVIA 100 MG tablet TAKE 1 TABLET BY MOUTH EVERY DAY 90 tablet 0   KLOR-CON M10 10 MEQ tablet TAKE 1 TABLET BY MOUTH EVERY DAY *NEED APPOINTMENT FOR REFILLS* 30 tablet 0   Lancets MISC Use as directed E11.9 100 each 11   metFORMIN (GLUCOPHAGE-XR) 500 MG 24 hr tablet TAKE 2 TABLETS BY MOUTH EVERY DAY WITH BREAKFAST 180 tablet 0   nebivolol (BYSTOLIC) 10 MG tablet TAKE 2 TABLETS BY MOUTH EVERY DAY 180 tablet 1   omeprazole (PRILOSEC) 40 MG capsule TAKE 1 CAPSULE BY MOUTH EVERY DAY 90 capsule 0   VALIUM 5 MG tablet Take 10 mg by mouth daily.     No current facility-administered medications on file prior to visit.        ROS:  All others reviewed and negative.  Objective        PE:  BP 114/70 (BP Location: Right Arm, Patient Position: Sitting, Cuff Size: Large)   Pulse 72   Temp 98.1 F (36.7 C) (Oral)   Ht _0  (1.854 m)   Wt 175 lb (79.4 kg)   SpO2 97%   BMI 23.09 kg/m                 Constitutional: Pt appears in NAD               HENT: Head: NCAT.                Right Ear: External ear normal.                 Left Ear: External ear normal.                Eyes: . Pupils are equal, round, and reactive to  light. Conjunctivae and EOM are normal               Nose: without d/c or deformity               Neck: Neck supple. Gross normal ROM  Cardiovascular: Normal rate and regular rhythm.                 Pulmonary/Chest: Effort normal and breath sounds without rales or wheezing.                Abd:  Soft, NT, ND, + BS, no organomegaly               Neurological: Pt is alert. At baseline orientation, motor grossly intact               Skin: Skin is warm. No rashes, no other new lesions, LE edema - none               Psychiatric: Pt behavior is normal without agitation   Micro: none  Cardiac tracings I have personally interpreted today:  none  Pertinent Radiological findings (summarize): none   Lab Results  Component Value Date   WBC 6.5 03/20/2022   HGB 13.5 03/20/2022   HCT 40.2 03/20/2022   PLT 256.0 03/20/2022   GLUCOSE 98 03/20/2022   CHOL 164 03/20/2022   TRIG 185.0 (H) 03/20/2022   HDL 57.70 03/20/2022   LDLDIRECT 117.9 01/14/2008   LDLCALC 69 03/20/2022   ALT 9 03/20/2022   AST 14 03/20/2022   NA 142 03/20/2022   K 3.5 03/20/2022   CL 101 03/20/2022   CREATININE 1.55 (H) 03/20/2022   BUN 26 (H) 03/20/2022   CO2 31 03/20/2022   TSH 0.42 03/20/2022   PSA 0.52 03/20/2022   INR 1.0 10/29/2021   HGBA1C 6.1 03/20/2022   MICROALBUR 4.5 (H) 03/20/2022   Assessment/Plan:  Eidan Muellner is a 61 y.o. Black or African American [2] male with  has a past medical history of Anginal pain ([place]), Chest pain (02/13/2012), Chest pain (06/09/2007), Chest pain (06/09/2007), Chronic kidney disease, Diabetes mellitus, DIABETES MELLITUS, TYPE II (08/29/2009), GERD (gastroesophageal reflux disease) (08/12/2010), Hemorrhoids, History of anal fissures, Hyperlipidemia, HYPERLIPIDEMIA (08/29/2009), Hypertension, HYPERTENSION (01/14/2008), Lightheadedness (05/10/2002), LVH (left ventricular hypertrophy), Multinodular goiter (12/06/2015), Nonspecific ST-T wave electrocardiographic  changes (08/11/2006), OSA (obstructive sleep apnea), Preventative health care (08/11/2010), Sleep apnea (08/13/2008), and SLEEP APNEA, OBSTRUCTIVE (01/14/2008).  Encounter for well adult exam with abnormal findings Age and sex appropriate education and counseling updated with regular exercise and diet Referrals for preventative services - pt to call for optho appt soon Immunizations addressed - declines covid booster, but for shingrix #2 today Smoking counseling  - counseled to quit, pt not ready Evidence for depression or other mood disorder - none significant Most recent labs reviewed. I have personally reviewed and have noted: 1) the patient's medical and social history 2) The patient's current medications and supplements 3) The patient's height, weight, and BMI have been recorded in the chart   Diabetes [facility]) Lab Results  Component Value Date   HGBA1C 6.1 03/20/2022   Stable, pt to continue current medical treatment januvia 100 qd, metformin ER 500 - 2 qam   Essential hypertension BP Readings from Last 3 Encounters:  03/20/22 114/70  12/09/21 120/82  11/07/21 123/76   Stable, pt to continue medical treatment lotrel 10-40, hct 25 qd, bystolic 10 qd   Hyperlipidemia Lab Results  Component Value Date   LDLCALC 69 03/20/2022   Stable, pt to continue current statin pravachol 80 mg qd   Hyperthyroidism Asympt, for f/u TSH and Free t4  Tobacco abuse Pt counsled to quit, pt not ready  History of colon polyps Due for colonoscopy - for referral  Followup:  Return in about 6 months (around 09/19/2022).  Cathlean Cower, MD 03/21/2022 2:52 PM [facility]

## 2022-03-20 NOTE — Patient Instructions (Signed)
You had the Shingles shot #2 today (no more needed after this)  Please continue all other medications as before, and refills have been done if requested - pravastatin  Please have the pharmacy call with any other refills you may need.  Please continue your efforts at being more active, low cholesterol diet, and weight control.  You are otherwise up to date with prevention measures today.  Please keep your appointments with your specialists as you may have planned  Please go to the LAB at the blood drawing area for the tests to be done  You will be contacted by phone if any changes need to be made immediately.  Otherwise, you will receive a letter about your results with an explanation, but please check with MyChart first.  Please remember to sign up for MyChart if you have not done so, as this will be important to you in the future with finding out test results, communicating by private email, and scheduling acute appointments online when needed.  Please make an Appointment to return in 6 months, or sooner if needed

## 2022-03-21 ENCOUNTER — Encounter: Payer: Self-pay | Admitting: Internal Medicine

## 2022-03-21 DIAGNOSIS — Z1211 Encounter for screening for malignant neoplasm of colon: Secondary | ICD-10-CM | POA: Insufficient documentation

## 2022-03-21 DIAGNOSIS — Z86010 Personal history of colonic polyps: Secondary | ICD-10-CM | POA: Insufficient documentation

## 2022-03-21 NOTE — Assessment & Plan Note (Signed)
Age and sex appropriate education and counseling updated with regular exercise and diet Referrals for preventative services - pt to call for optho appt soon Immunizations addressed - declines covid booster, but for shingrix #2 today Smoking counseling  - counseled to quit, pt not ready Evidence for depression or other mood disorder - none significant Most recent labs reviewed. I have personally reviewed and have noted: 1) the patient's medical and social history 2) The patient's current medications and supplements 3) The patient's height, weight, and BMI have been recorded in the chart

## 2022-03-21 NOTE — Assessment & Plan Note (Signed)
Lab Results  Component Value Date   LDLCALC 69 03/20/2022   Stable, pt to continue current statin pravachol 80 mg qd

## 2022-03-21 NOTE — Assessment & Plan Note (Signed)
Lab Results  Component Value Date   HGBA1C 6.1 03/20/2022   Stable, pt to continue current medical treatment januvia 100 qd, metformin ER 500 - 2 qam

## 2022-03-21 NOTE — Assessment & Plan Note (Signed)
Asympt, for f/u TSH and Free t4

## 2022-03-21 NOTE — Assessment & Plan Note (Signed)
BP Readings from Last 3 Encounters:  03/20/22 114/70  12/09/21 120/82  11/07/21 123/76   Stable, pt to continue medical treatment lotrel 10-40, hct 25 qd, bystolic 10 qd

## 2022-03-21 NOTE — Assessment & Plan Note (Signed)
Due for colonoscopy - for referral

## 2022-03-21 NOTE — Assessment & Plan Note (Signed)
Pt counsled to quit, pt not ready °

## 2022-03-24 ENCOUNTER — Other Ambulatory Visit: Payer: Self-pay | Admitting: Internal Medicine

## 2022-03-26 ENCOUNTER — Other Ambulatory Visit: Payer: Self-pay | Admitting: Internal Medicine

## 2022-03-26 NOTE — Telephone Encounter (Signed)
Please refill as per office routine med refill policy (all routine meds to be refilled for 3 mo or monthly (per pt preference) up to one year from last visit, then month to month grace period for 3 mo, then further med refills will have to be denied) ? ?

## 2022-03-27 ENCOUNTER — Encounter: Payer: Self-pay | Admitting: Gastroenterology

## 2022-04-23 ENCOUNTER — Other Ambulatory Visit: Payer: Self-pay

## 2022-04-23 ENCOUNTER — Other Ambulatory Visit: Payer: Self-pay | Admitting: Internal Medicine

## 2022-04-23 NOTE — Telephone Encounter (Signed)
Please refill as per office routine med refill policy (all routine meds to be refilled for 3 mo or monthly (per pt preference) up to one year from last visit, then month to month grace period for 3 mo, then further med refills will have to be denied)

## 2022-04-25 ENCOUNTER — Other Ambulatory Visit: Payer: Self-pay | Admitting: Internal Medicine

## 2022-04-26 NOTE — Telephone Encounter (Signed)
Please refill as per office routine med refill policy (all routine meds to be refilled for 3 mo or monthly (per pt preference) up to one year from last visit, then month to month grace period for 3 mo, then further med refills will have to be denied)

## 2022-04-30 ENCOUNTER — Ambulatory Visit (AMBULATORY_SURGERY_CENTER): Payer: 59

## 2022-04-30 VITALS — Ht 73.0 in | Wt 184.0 lb

## 2022-04-30 DIAGNOSIS — Z86010 Personal history of colonic polyps: Secondary | ICD-10-CM

## 2022-04-30 MED ORDER — NA SULFATE-K SULFATE-MG SULF 17.5-3.13-1.6 GM/177ML PO SOLN: 1.0000 | Freq: Once | ORAL | 0 refills | Status: AC

## 2022-04-30 NOTE — Progress Notes (Signed)
No egg or soy allergy known to patient  No issues known to pt with past sedation with any surgeries or procedures Patient denies ever being told they had issues or difficulty with intubation  No FH of Malignant Hyperthermia Pt is not on diet pills Pt is not on  home 02  Pt is not on blood thinners  Pt denies issues with constipation  No A fib or A flutter Have any cardiac testing pending-- pt unsure at this time  Pt instructed to use Singlecare.com or GoodRx for a price reduction on prep   Patient's chart reviewed by Osvaldo Angst CNRA prior to previsit and patient appropriate for the [facility].  Previsit completed and red dot placed by patient's name on their procedure day (on provider's schedule).

## 2022-05-14 ENCOUNTER — Encounter: Payer: Self-pay | Admitting: Gastroenterology

## 2022-05-23 ENCOUNTER — Other Ambulatory Visit: Payer: Self-pay | Admitting: Urology

## 2022-05-23 DIAGNOSIS — D49512 Neoplasm of unspecified behavior of left kidney: Secondary | ICD-10-CM

## 2022-05-23 DIAGNOSIS — D49511 Neoplasm of unspecified behavior of right kidney: Secondary | ICD-10-CM

## 2022-05-27 ENCOUNTER — Ambulatory Visit (AMBULATORY_SURGERY_CENTER): Payer: 59 | Admitting: Gastroenterology

## 2022-05-27 ENCOUNTER — Encounter: Payer: Self-pay | Admitting: Gastroenterology

## 2022-05-27 VITALS — BP 126/81 | HR 57 | Temp 98.6°F | Resp 19 | Ht 73.0 in | Wt 167.0 lb

## 2022-05-27 DIAGNOSIS — Z09 Encounter for follow-up examination after completed treatment for conditions other than malignant neoplasm: Secondary | ICD-10-CM

## 2022-05-27 DIAGNOSIS — D127 Benign neoplasm of rectosigmoid junction: Secondary | ICD-10-CM

## 2022-05-27 DIAGNOSIS — K635 Polyp of colon: Secondary | ICD-10-CM | POA: Diagnosis not present

## 2022-05-27 DIAGNOSIS — Z86010 Personal history of colonic polyps: Secondary | ICD-10-CM | POA: Diagnosis not present

## 2022-05-27 DIAGNOSIS — D123 Benign neoplasm of transverse colon: Secondary | ICD-10-CM | POA: Diagnosis not present

## 2022-05-27 DIAGNOSIS — D125 Benign neoplasm of sigmoid colon: Secondary | ICD-10-CM

## 2022-05-27 DIAGNOSIS — D128 Benign neoplasm of rectum: Secondary | ICD-10-CM

## 2022-05-27 DIAGNOSIS — D122 Benign neoplasm of ascending colon: Secondary | ICD-10-CM

## 2022-05-27 MED ORDER — SODIUM CHLORIDE 0.9 % IV SOLN: 500.0000 mL | Freq: Once | INTRAVENOUS | Status: DC

## 2022-05-27 NOTE — Patient Instructions (Signed)
Handouts provided on polyps, diverticulosis, hemorrhoids and high fiber diet.   Recommend a high-fiber diet (see handout). Use FiberCon 1-2 tablets by mouth daily.  Continue present medications.  Await pathology results.  Repeat colonoscopy in likely 3 years for surveillance.   YOU HAD AN ENDOSCOPIC PROCEDURE TODAY AT [facility]:   Refer to the procedure report that was given to you for any specific questions about what was found during the examination.  If the procedure report does not answer your questions, please call your gastroenterologist to clarify.  If you requested that your care partner not be given the details of your procedure findings, then the procedure report has been included in a sealed envelope for you to review at your convenience later.  YOU SHOULD EXPECT: Some feelings of bloating in the abdomen. Passage of more gas than usual.  Walking can help get rid of the air that was put into your GI tract during the procedure and reduce the bloating. If you had a lower endoscopy (such as a colonoscopy or flexible sigmoidoscopy) you may notice spotting of blood in your stool or on the toilet paper. If you underwent a bowel prep for your procedure, you may not have a normal bowel movement for a few days.  Please Note:  You might notice some irritation and congestion in your nose or some drainage.  This is from the oxygen used during your procedure.  There is no need for concern and it should clear up in a day or so.  SYMPTOMS TO REPORT IMMEDIATELY:  Following lower endoscopy (colonoscopy or flexible sigmoidoscopy):  Excessive amounts of blood in the stool  Significant tenderness or worsening of abdominal pains  Swelling of the abdomen that is new, acute  Fever of 100F or higher  For urgent or emergent issues, a gastroenterologist can be reached at any hour by calling [phone]. Do not use MyChart messaging for urgent concerns.    DIET:  We do recommend a  small meal at first, but then you may proceed to your regular diet.  Drink plenty of fluids but you should avoid alcoholic beverages for 24 hours.  ACTIVITY:  You should plan to take it easy for the rest of today and you should NOT DRIVE or use heavy machinery until tomorrow (because of the sedation medicines used during the test).    FOLLOW UP: Our staff will call the number listed on your records the next business day following your procedure.  We will call around 7:15- 8:00 am to check on you and address any questions or concerns that you may have regarding the information given to you following your procedure. If we do not reach you, we will leave a message.     If any biopsies were taken you will be contacted by phone or by letter within the next 1-3 weeks.  Please call us at [phone] if you have not heard about the biopsies in 3 weeks.    SIGNATURES/CONFIDENTIALITY: You and/or your care partner have signed paperwork which will be entered into your electronic medical record.  These signatures attest to the fact that that the information above on your After Visit Summary has been reviewed and is understood.  Full responsibility of the confidentiality of this discharge information lies with you and/or your care-partner.

## 2022-05-27 NOTE — Progress Notes (Signed)
Called to room to assist during endoscopic procedure.  Patient ID and intended procedure confirmed with present staff. Received instructions for my participation in the procedure from the performing physician.  

## 2022-05-27 NOTE — Op Note (Signed)
[facility] Patient Name: [name] Procedure Date: 05/27/2022 11:25 AM MRN: [id number] Endoscopist: Justice Britain , MD, [id number] Age: 62 Referring MD:  Date of Birth: 1961-03-27 Gender: Male Account #: [id number] Procedure:                Colonoscopy Indications:              Screening for colorectal malignant neoplasm Medicines:                Monitored Anesthesia Care Procedure:                Pre-Anesthesia Assessment:                           - Prior to the procedure, a History and Physical                            was performed, and patient medications and                            allergies were reviewed. The patient's tolerance of                            previous anesthesia was also reviewed. The risks                            and benefits of the procedure and the sedation                            options and risks were discussed with the patient.                            All questions were answered, and informed consent                            was obtained. Prior Anticoagulants: The patient has                            taken no anticoagulant or antiplatelet agents. ASA                            Grade Assessment: III - A patient with severe                            systemic disease. After reviewing the risks and                            benefits, the patient was deemed in satisfactory                            condition to undergo the procedure.                           After obtaining informed consent, the colonoscope  was passed under direct vision. Throughout the                            procedure, the patient's blood pressure, pulse, and                            oxygen saturations were monitored continuously. The                            CF [id number] was introduced through the anus                            and advanced to the 3 cm into the ileum. The                             colonoscopy was performed without difficulty. The                            patient tolerated the procedure. The quality of the                            bowel preparation was adequate. The terminal ileum,                            ileocecal valve, appendiceal orifice, and rectum                            were photographed. Scope In: 11:29:58 AM Scope Out: 11:51:26 AM Scope Withdrawal Time: 0 hours 18 minutes 31 seconds  Total Procedure Duration: 0 hours 21 minutes 28 seconds  Findings:                 The digital rectal exam findings include                            hemorrhoids. Pertinent negatives include no                            palpable rectal lesions.                           The terminal ileum and ileocecal valve appeared                            normal.                           Nine sessile polyps were found in the rectum (1),                            recto-sigmoid colon (1), sigmoid colon (2),                            transverse colon (4) and ascending colon (1). The  polyps were 2 to 10 mm in size. These polyps were                            removed with a cold snare. Resection and retrieval                            were complete.                           Multiple small-mouthed diverticula were found in                            the entire colon.                           2 medium scars were found in the distal rectum                            (likely from previous hemorrhoidal banding).                           Normal mucosa was found in the entire colon                            otherwise.                           Non-bleeding non-thrombosed internal hemorrhoids                            were found during retroflexion, during perianal                            exam and during digital exam. The hemorrhoids were                            Grade II (internal hemorrhoids that prolapse but                            reduce  spontaneously). Complications:            No immediate complications. Estimated Blood Loss:     Estimated blood loss was minimal. Impression:               - Hemorrhoids found on digital rectal exam.                           - The examined portion of the ileum was normal.                           - Nine, 2 to 10 mm polyps in the rectum, at the                            recto-sigmoid colon, in the sigmoid colon, in the  transverse colon and in the ascending colon,                            removed with a cold snare. Resected and retrieved.                           - Diverticulosis in the entire examined colon.                           - Scars in the distal rectum from likely prior                            hemorrhoidal banding.                           - Normal mucosa in the entire examined colon                            otherwise.                           - Non-bleeding non-thrombosed internal hemorrhoids. Recommendation:           - The patient will be observed post-procedure,                            until all discharge criteria are met.                           - Discharge patient to home.                           - Patient has a contact number available for                            emergencies. The signs and symptoms of potential                            delayed complications were discussed with the                            patient. Return to normal activities tomorrow.                            Written discharge instructions were provided to the                            patient.                           - Continue present medications.                           - Await pathology results.                           - Repeat colonoscopy in  likely 3 years for                            surveillance.                           - The findings and recommendations were discussed                            with the patient.                            - The findings and recommendations were discussed                            with the designated responsible adult. Justice Britain, MD 05/27/2022 11:58:39 AM

## 2022-05-27 NOTE — Progress Notes (Signed)
GASTROENTEROLOGY PROCEDURE H&P NOTE   Primary Care Physician: Biagio Borg, MD  HPI: [name] is a 62 y.o. male who presents for Screening colonoscopy.  Past Medical History:  Diagnosis Date   Anginal pain ([place])    Chest pain 02/13/2012   echo - EF >55%;mod concentric L ventricular hypertrophy; atrial septum aneurysmal; patent foramen ovale suspected on color flow, LA mil/mod dilation; RV systolic pressure 99991111   Chest pain 06/09/2007   echo - EF 45-50%; interarterial shunt; mild valvular regurgitation;    Chest pain 06/09/2007   Myoview - EF 53%; normal perfusion all regions;EKG negative for ischemia    Chronic kidney disease    Diabetes mellitus    type II   DIABETES MELLITUS, TYPE II 08/29/2009   GERD (gastroesophageal reflux disease) 08/12/2010   Hemorrhoids    History of anal fissures    Hyperlipidemia    HYPERLIPIDEMIA 08/29/2009   Hypertension    HYPERTENSION 01/14/2008   Lightheadedness 05/10/2002   30d monitor unremarkable   LVH (left ventricular hypertrophy)    with systolic dysfunction by echo 10/03, EF 45-50% - Dr. Melvern Banker   Multinodular goiter 12/06/2015   Nonspecific ST-T wave electrocardiographic changes 08/11/2006   Myoview - EF 49%; normal perfusion all regions, EKG negative for ischemia   OSA (obstructive sleep apnea)    Preventative health care 08/11/2010   Sleep apnea 08/13/2008   AHI 1.2; sleep study 10/2007 - AHI 9.47at 15cm H2O and 31.3 at [facility], OBSTRUCTIVE 01/14/2008   Past Surgical History:  Procedure Laterality Date   ANAL SPHINCTEROTOMY  08/20/11   CARDIAC CATHETERIZATION  10/23/2008   normal coronary arteries and LV function   COLONOSCOPY     IR RADIOLOGIST EVAL & MGMT  04/12/2021   IR RADIOLOGIST EVAL & MGMT  08/13/2021   IR RADIOLOGIST EVAL & MGMT  09/11/2021   IR RADIOLOGIST EVAL & MGMT  12/04/2021   IR RADIOLOGIST EVAL & MGMT  03/06/2022   POLYPECTOMY     RADIOLOGY WITH ANESTHESIA N/A 05/15/2021   Procedure:  MICROWAVE ABLATION;  Surgeon: Michaelle Birks, MD;  Location: [facility];  Service: Radiology;  Laterality: N/A;   RADIOLOGY WITH ANESTHESIA Right 11/06/2021   Procedure: IR WITH ANESTHESIA RIGHT RENAL CRYOABLATION;  Surgeon: Michaelle Birks, MD;  Location: [facility];  Service: Radiology;  Laterality: Right;   Current Outpatient Medications  Medication Sig Dispense Refill   amLODipine-benazepril (LOTREL) 10-40 MG capsule TAKE 1 CAPSULE BY MOUTH EVERY DAY 90 capsule 0   Blood Glucose Monitoring Suppl (ONE TOUCH ULTRA 2) w/Device KIT Use as directed  E11.9 1 each 0   hydrALAZINE (APRESOLINE) 50 MG tablet TAKE 1 TABLET BY MOUTH THREE TIMES A DAY 270 tablet 1   hydrochlorothiazide (HYDRODIURIL) 25 MG tablet TAKE 1 TABLET (25 MG TOTAL) BY MOUTH DAILY. 90 tablet 1   JANUVIA 100 MG tablet TAKE 1 TABLET BY MOUTH EVERY DAY 90 tablet 0   Lancets MISC Use as directed E11.9 100 each 11   metFORMIN (GLUCOPHAGE-XR) 500 MG 24 hr tablet TAKE 2 TABLETS BY MOUTH EVERY DAY WITH BREAKFAST 180 tablet 0   nebivolol (BYSTOLIC) 10 MG tablet TAKE 2 TABLETS BY MOUTH EVERY DAY 180 tablet 1   omeprazole (PRILOSEC) 40 MG capsule TAKE 1 CAPSULE BY MOUTH EVERY DAY 90 capsule 0   potassium chloride (KLOR-CON M10) 10 MEQ tablet Take 1 tablet (10 mEq total) by mouth daily. 90 tablet 1   pravastatin (PRAVACHOL) 80 MG tablet  Take 1 tablet (80 mg total) by mouth daily. 90 tablet 3   VALIUM 5 MG tablet Take 10 mg by mouth daily. (Patient not taking: Reported on 04/30/2022)     No current facility-administered medications for this visit.    Current Outpatient Medications:    amLODipine-benazepril (LOTREL) 10-40 MG capsule, TAKE 1 CAPSULE BY MOUTH EVERY DAY, Disp: 90 capsule, Rfl: 0   Blood Glucose Monitoring Suppl (ONE TOUCH ULTRA 2) w/Device KIT, Use as directed  E11.9, Disp: 1 each, Rfl: 0   hydrALAZINE (APRESOLINE) 50 MG tablet, TAKE 1 TABLET BY MOUTH THREE TIMES A DAY, Disp: 270 tablet, Rfl: 1   hydrochlorothiazide (HYDRODIURIL) 25 MG  tablet, TAKE 1 TABLET (25 MG TOTAL) BY MOUTH DAILY., Disp: 90 tablet, Rfl: 1   JANUVIA 100 MG tablet, TAKE 1 TABLET BY MOUTH EVERY DAY, Disp: 90 tablet, Rfl: 0   Lancets MISC, Use as directed E11.9, Disp: 100 each, Rfl: 11   metFORMIN (GLUCOPHAGE-XR) 500 MG 24 hr tablet, TAKE 2 TABLETS BY MOUTH EVERY DAY WITH BREAKFAST, Disp: 180 tablet, Rfl: 0   nebivolol (BYSTOLIC) 10 MG tablet, TAKE 2 TABLETS BY MOUTH EVERY DAY, Disp: 180 tablet, Rfl: 1   omeprazole (PRILOSEC) 40 MG capsule, TAKE 1 CAPSULE BY MOUTH EVERY DAY, Disp: 90 capsule, Rfl: 0   potassium chloride (KLOR-CON M10) 10 MEQ tablet, Take 1 tablet (10 mEq total) by mouth daily., Disp: 90 tablet, Rfl: 1   pravastatin (PRAVACHOL) 80 MG tablet, Take 1 tablet (80 mg total) by mouth daily., Disp: 90 tablet, Rfl: 3   VALIUM 5 MG tablet, Take 10 mg by mouth daily. (Patient not taking: Reported on 04/30/2022), Disp: , Rfl:  Allergies  Allergen Reactions   Sulfa Antibiotics Hives and Swelling   Vicodin [Hydrocodone-Acetaminophen] Itching    Patient states he can take Tylenol without difficulty.   Family History  Problem Relation Age of Onset   Hypertension Mother    Heart disease Father    Colon cancer Neg Hx    Thyroid disease Neg Hx    Colon polyps Neg Hx    Esophageal cancer Neg Hx    Rectal cancer Neg Hx    Stomach cancer Neg Hx    Social History   Socioeconomic History   Marital status: Single    Spouse name: Not on file   Number of children: 1   Years of education: Not on file   Highest education level: Not on file  Occupational History   Occupation: work as truck Education administrator: [employer]  Tobacco Use   Smoking status: Every Day    Packs/day: 0.50    Years: 30.00    Total pack years: 15.00    Types: Cigarettes   Smokeless tobacco: Never  Vaping Use   Vaping Use: Never used  Substance and Sexual Activity   Alcohol use: Yes    Alcohol/week: 3.0 standard drinks of alcohol    Types: 3 Standard drinks or  equivalent per week    Comment: occaisionally   Drug use: No   Sexual activity: Not on file  Other Topics Concern   Not on file  Social History Narrative   Not on file   Social Determinants of Health   Financial Resource Strain: Not on file  Food Insecurity: Not on file  Transportation Needs: Not on file  Physical Activity: Not on file  Stress: Not on file  Social Connections: Not on file  Intimate Partner Violence: Not on file  Physical Exam: There were no vitals filed for this visit. There is no height or weight on file to calculate BMI. GEN: NAD EYE: Sclerae anicteric ENT: MMM CV: Non-tachycardic GI: Soft, NT/ND NEURO:  Alert & Oriented x 3  Lab Results: No results for input(s): "WBC", "HGB", "HCT", "PLT" in the last 72 hours. BMET No results for input(s): "NA", "K", "CL", "CO2", "GLUCOSE", "BUN", "CREATININE", "CALCIUM" in the last 72 hours. LFT No results for input(s): "PROT", "ALBUMIN", "AST", "ALT", "ALKPHOS", "BILITOT", "BILIDIR", "IBILI" in the last 72 hours. PT/INR No results for input(s): "LABPROT", "INR" in the last 72 hours.   Impression / Plan: This is a 62 y.o.male who presents for Screening colonoscopy.  The risks and benefits of endoscopic evaluation/treatment were discussed with the patient and/or family; these include but are not limited to the risk of perforation, infection, bleeding, missed lesions, lack of diagnosis, severe illness requiring hospitalization, as well as anesthesia and sedation related illnesses.  The patient's history has been reviewed, patient examined, no change in status, and deemed stable for procedure.  The patient and/or family is agreeable to proceed.    Justice Britain, MD [facility] Office # [id number]

## 2022-05-27 NOTE — Progress Notes (Signed)
Vss nad trans to pacu °

## 2022-05-27 NOTE — Progress Notes (Signed)
Pt's states no medical or surgical changes since previsit or office visit. 

## 2022-05-28 ENCOUNTER — Other Ambulatory Visit: Payer: Self-pay | Admitting: Internal Medicine

## 2022-05-28 ENCOUNTER — Telehealth: Payer: Self-pay

## 2022-05-28 NOTE — Telephone Encounter (Signed)
Please refill as per office routine med refill policy (all routine meds to be refilled for 3 mo or monthly (per pt preference) up to one year from last visit, then month to month grace period for 3 mo, then further med refills will have to be denied) ? ?

## 2022-05-28 NOTE — Telephone Encounter (Signed)
  Follow up Call-     05/27/2022   10:34 AM  Call back number  Post procedure Call Back phone  # [phone]  Permission to leave phone message Yes     Patient questions:  Do you have a fever, pain , or abdominal swelling? No. Pain Score  0 *  Have you tolerated food without any problems? Yes.    Have you been able to return to your normal activities? Yes.    Do you have any questions about your discharge instructions: Diet   No. Medications  No. Follow up visit  No.  Do you have questions or concerns about your Care? No.  Actions: * If pain score is 4 or above: No action needed, pain <4.

## 2022-06-02 ENCOUNTER — Encounter: Payer: Self-pay | Admitting: Gastroenterology

## 2022-06-14 ENCOUNTER — Ambulatory Visit
Admission: RE | Admit: 2022-06-14 | Discharge: 2022-06-14 | Disposition: A | Discharge status: Home / Self Care | Payer: 59 | Source: Ambulatory Visit | Attending: Urology | Admitting: Urology

## 2022-06-14 DIAGNOSIS — D49511 Neoplasm of unspecified behavior of right kidney: Secondary | ICD-10-CM

## 2022-06-14 DIAGNOSIS — D49512 Neoplasm of unspecified behavior of left kidney: Secondary | ICD-10-CM

## 2022-06-14 MED ORDER — GADOPICLENOL 0.5 MMOL/ML IV SOLN
8.0000 mL | Freq: Once | INTRAVENOUS | Status: AC | PRN
  Administered 2022-06-14: 8 mL via INTRAVENOUS

## 2022-06-21 ENCOUNTER — Other Ambulatory Visit: Payer: Self-pay | Admitting: Internal Medicine

## 2022-07-21 ENCOUNTER — Other Ambulatory Visit: Payer: Self-pay | Admitting: Internal Medicine

## 2022-07-24 ENCOUNTER — Encounter: Payer: Self-pay | Admitting: Internal Medicine

## 2022-07-26 ENCOUNTER — Other Ambulatory Visit: Payer: Self-pay | Admitting: Internal Medicine

## 2022-07-30 MED ORDER — SITAGLIPTIN PHOSPHATE 100 MG PO TABS: 100.0000 mg | ORAL_TABLET | Freq: Every day | ORAL | 0 refills | Status: DC

## 2022-08-04 ENCOUNTER — Other Ambulatory Visit: Payer: Self-pay | Admitting: Internal Medicine

## 2022-08-08 ENCOUNTER — Other Ambulatory Visit: Payer: Self-pay | Admitting: *Deleted

## 2022-08-08 MED ORDER — AMLODIPINE BESY-BENAZEPRIL HCL 10-40 MG PO CAPS: ORAL_CAPSULE | ORAL | 3 refills | Status: DC

## 2022-08-12 ENCOUNTER — Other Ambulatory Visit: Payer: Self-pay | Admitting: Internal Medicine

## 2022-08-31 ENCOUNTER — Other Ambulatory Visit: Payer: Self-pay | Admitting: Internal Medicine

## 2022-09-01 ENCOUNTER — Other Ambulatory Visit: Payer: Self-pay

## 2022-09-11 ENCOUNTER — Other Ambulatory Visit: Payer: Self-pay | Admitting: Internal Medicine

## 2022-09-16 ENCOUNTER — Encounter: Payer: Self-pay | Admitting: Internal Medicine

## 2022-09-16 DIAGNOSIS — E538 Deficiency of other specified B group vitamins: Secondary | ICD-10-CM

## 2022-09-16 DIAGNOSIS — Z125 Encounter for screening for malignant neoplasm of prostate: Secondary | ICD-10-CM

## 2022-09-16 DIAGNOSIS — E559 Vitamin D deficiency, unspecified: Secondary | ICD-10-CM

## 2022-09-16 DIAGNOSIS — Z794 Long term (current) use of insulin: Secondary | ICD-10-CM

## 2022-09-17 ENCOUNTER — Other Ambulatory Visit (INDEPENDENT_AMBULATORY_CARE_PROVIDER_SITE_OTHER): Payer: 59

## 2022-09-17 DIAGNOSIS — E559 Vitamin D deficiency, unspecified: Secondary | ICD-10-CM | POA: Diagnosis not present

## 2022-09-17 DIAGNOSIS — E0800 Diabetes mellitus due to underlying condition with hyperosmolarity without nonketotic hyperglycemic-hyperosmolar coma (NKHHC): Secondary | ICD-10-CM

## 2022-09-17 DIAGNOSIS — E538 Deficiency of other specified B group vitamins: Secondary | ICD-10-CM | POA: Diagnosis not present

## 2022-09-17 DIAGNOSIS — Z794 Long term (current) use of insulin: Secondary | ICD-10-CM | POA: Diagnosis not present

## 2022-09-17 DIAGNOSIS — Z125 Encounter for screening for malignant neoplasm of prostate: Secondary | ICD-10-CM | POA: Diagnosis not present

## 2022-09-17 LAB — CBC WITH DIFFERENTIAL/PLATELET
Basophils Absolute: 0 10*3/uL (ref 0.0–0.1)
Basophils Relative: 0.4 % (ref 0.0–3.0)
Eosinophils Absolute: 0.2 10*3/uL (ref 0.0–0.7)
Eosinophils Relative: 3.7 % (ref 0.0–5.0)
HCT: 40.9 % (ref 39.0–52.0)
Hemoglobin: 13.6 g/dL (ref 13.0–17.0)
Lymphocytes Relative: 26.2 % (ref 12.0–46.0)
Lymphs Abs: 1.7 10*3/uL (ref 0.7–4.0)
MCHC: 33.2 g/dL (ref 30.0–36.0)
MCV: 98.9 fl (ref 78.0–100.0)
Monocytes Absolute: 1.1 10*3/uL — ABNORMAL HIGH (ref 0.1–1.0)
Monocytes Relative: 17.8 % — ABNORMAL HIGH (ref 3.0–12.0)
Neutro Abs: 3.3 10*3/uL (ref 1.4–7.7)
Neutrophils Relative %: 51.9 % (ref 43.0–77.0)
Platelets: 229 10*3/uL (ref 150.0–400.0)
RBC: 4.14 Mil/uL — ABNORMAL LOW (ref 4.22–5.81)
RDW: 13.4 % (ref 11.5–15.5)
WBC: 6.4 10*3/uL (ref 4.0–10.5)

## 2022-09-17 LAB — URINALYSIS, ROUTINE W REFLEX MICROSCOPIC
Bilirubin Urine: NEGATIVE
Hgb urine dipstick: NEGATIVE
Ketones, ur: NEGATIVE
Leukocytes,Ua: NEGATIVE
Nitrite: NEGATIVE
RBC / HPF: NONE SEEN (ref 0–?)
Specific Gravity, Urine: 1.01 (ref 1.000–1.030)
Total Protein, Urine: NEGATIVE
Urine Glucose: NEGATIVE
Urobilinogen, UA: 0.2 (ref 0.0–1.0)
pH: 6.5 (ref 5.0–8.0)

## 2022-09-17 LAB — HEPATIC FUNCTION PANEL
ALT: 10 U/L (ref 0–53)
AST: 16 U/L (ref 0–37)
Albumin: 4.6 g/dL (ref 3.5–5.2)
Alkaline Phosphatase: 65 U/L (ref 39–117)
Bilirubin, Direct: 0.2 mg/dL (ref 0.0–0.3)
Total Bilirubin: 0.8 mg/dL (ref 0.2–1.2)
Total Protein: 7.5 g/dL (ref 6.0–8.3)

## 2022-09-17 LAB — VITAMIN B12: Vitamin B-12: 242 pg/mL (ref 211–911)

## 2022-09-17 LAB — BASIC METABOLIC PANEL
BUN: 16 mg/dL (ref 6–23)
CO2: 29 mEq/L (ref 19–32)
Calcium: 9.8 mg/dL (ref 8.4–10.5)
Chloride: 98 mEq/L (ref 96–112)
Creatinine, Ser: 1.56 mg/dL — ABNORMAL HIGH (ref 0.40–1.50)
GFR: 47.03 mL/min — ABNORMAL LOW (ref >60.00)
Glucose, Bld: 104 mg/dL — ABNORMAL HIGH (ref 70–99)
Potassium: 3.4 mEq/L — ABNORMAL LOW (ref 3.5–5.1)
Sodium: 139 mEq/L (ref 135–145)

## 2022-09-17 LAB — LIPID PANEL
Cholesterol: 184 mg/dL (ref 0–200)
HDL: 56.8 mg/dL (ref >39.00)
LDL Cholesterol: 99 mg/dL (ref 0–99)
NonHDL: 126.81
Total CHOL/HDL Ratio: 3
Triglycerides: 137 mg/dL (ref 0.0–149.0)
VLDL: 27.4 mg/dL (ref 0.0–40.0)

## 2022-09-17 LAB — HEMOGLOBIN A1C: Hgb A1c MFr Bld: 5.8 % (ref 4.6–6.5)

## 2022-09-17 LAB — TSH: TSH: 0.91 u[IU]/mL (ref 0.35–5.50)

## 2022-09-17 LAB — MICROALBUMIN / CREATININE URINE RATIO
Creatinine,U: 142.4 mg/dL
Microalb Creat Ratio: 2.6 mg/g (ref 0.0–30.0)
Microalb, Ur: 3.6 mg/dL — ABNORMAL HIGH (ref 0.0–1.9)

## 2022-09-17 LAB — PSA: PSA: 0.66 ng/mL (ref 0.10–4.00)

## 2022-09-17 LAB — VITAMIN D 25 HYDROXY (VIT D DEFICIENCY, FRACTURES): VITD: 73.38 ng/mL (ref 30.00–100.00)

## 2022-09-19 ENCOUNTER — Encounter: Payer: Self-pay | Admitting: Internal Medicine

## 2022-09-19 ENCOUNTER — Ambulatory Visit: Payer: 59 | Admitting: Internal Medicine

## 2022-09-19 VITALS — BP 120/82 | HR 67 | Temp 98.6°F | Ht 73.0 in | Wt 173.0 lb

## 2022-09-19 DIAGNOSIS — E876 Hypokalemia: Secondary | ICD-10-CM | POA: Insufficient documentation

## 2022-09-19 DIAGNOSIS — E782 Mixed hyperlipidemia: Secondary | ICD-10-CM

## 2022-09-19 DIAGNOSIS — I10 Essential (primary) hypertension: Secondary | ICD-10-CM

## 2022-09-19 DIAGNOSIS — E0800 Diabetes mellitus due to underlying condition with hyperosmolarity without nonketotic hyperglycemic-hyperosmolar coma (NKHHC): Secondary | ICD-10-CM

## 2022-09-19 DIAGNOSIS — Z720 Tobacco use: Secondary | ICD-10-CM

## 2022-09-19 DIAGNOSIS — E538 Deficiency of other specified B group vitamins: Secondary | ICD-10-CM | POA: Diagnosis not present

## 2022-09-19 DIAGNOSIS — N183 Chronic kidney disease, stage 3 unspecified: Secondary | ICD-10-CM | POA: Insufficient documentation

## 2022-09-19 DIAGNOSIS — N1831 Chronic kidney disease, stage 3a: Secondary | ICD-10-CM

## 2022-09-19 DIAGNOSIS — Z0001 Encounter for general adult medical examination with abnormal findings: Secondary | ICD-10-CM

## 2022-09-19 DIAGNOSIS — Z0000 Encounter for general adult medical examination without abnormal findings: Secondary | ICD-10-CM | POA: Diagnosis not present

## 2022-09-19 DIAGNOSIS — Z794 Long term (current) use of insulin: Secondary | ICD-10-CM

## 2022-09-19 DIAGNOSIS — F17200 Nicotine dependence, unspecified, uncomplicated: Secondary | ICD-10-CM | POA: Insufficient documentation

## 2022-09-19 DIAGNOSIS — E559 Vitamin D deficiency, unspecified: Secondary | ICD-10-CM | POA: Diagnosis not present

## 2022-09-19 MED ORDER — HYDROCHLOROTHIAZIDE 25 MG PO TABS: 25.0000 mg | ORAL_TABLET | Freq: Every day | ORAL | 3 refills | Status: DC

## 2022-09-19 MED ORDER — HYDRALAZINE HCL 50 MG PO TABS: 50.0000 mg | ORAL_TABLET | Freq: Three times a day (TID) | ORAL | 3 refills | Status: DC

## 2022-09-19 MED ORDER — METFORMIN HCL ER 500 MG PO TB24: ORAL_TABLET | ORAL | 3 refills | Status: DC

## 2022-09-19 MED ORDER — POTASSIUM CHLORIDE CRYS ER 10 MEQ PO TBCR: 20.0000 meq | EXTENDED_RELEASE_TABLET | Freq: Every day | ORAL | 3 refills | Status: DC

## 2022-09-19 MED ORDER — DAPAGLIFLOZIN PROPANEDIOL 10 MG PO TABS: 10.0000 mg | ORAL_TABLET | Freq: Every day | ORAL | 3 refills | Status: DC

## 2022-09-19 MED ORDER — OMEPRAZOLE 40 MG PO CPDR: DELAYED_RELEASE_CAPSULE | ORAL | 3 refills | Status: DC

## 2022-09-19 MED ORDER — NEBIVOLOL HCL 10 MG PO TABS: 20.0000 mg | ORAL_TABLET | Freq: Every day | ORAL | 3 refills | Status: DC

## 2022-09-19 MED ORDER — AMLODIPINE BESY-BENAZEPRIL HCL 10-40 MG PO CAPS: ORAL_CAPSULE | ORAL | 3 refills | Status: DC

## 2022-09-19 MED ORDER — ROSUVASTATIN CALCIUM 40 MG PO TABS: 40.0000 mg | ORAL_TABLET | Freq: Every day | ORAL | 3 refills | Status: DC

## 2022-09-19 NOTE — Patient Instructions (Addendum)
Please remember to have your yearly eye exam  Ok to stop the Januvia and pravastatin  Please take all new medication as prescribed - the Farxiga 10 mg per day for sugar and kidneys, and Crestor 40 mg per day for cholesterol  Ok to increase the potassium to 2 pills per day  Please take all new medication as recommended - the OTC B12 1000 mcg per day for 6 months only  Please stop smoking  Please call if you change your  mind about referral to Pulmonary for the yearly CT lung cancer screening, as well as the Cardiac CT Score testing to check for heart artery blockages  Please continue all other medications as before, and refills have been done if requested.  Please have the pharmacy call with any other refills you may need.  Please continue your efforts at being more active, low cholesterol diet, and weight control.  You are otherwise up to date with prevention measures today.  Please keep your appointments with your specialists as you may have planned  Please make an Appointment to return in 6 months, or sooner if needed, also with Lab Appointment for testing done 3-5 days before at the FIRST FLOOR Lab (so this is for TWO appointments - please see the scheduling desk as you leave)

## 2022-09-19 NOTE — Progress Notes (Unsigned)
Patient ID: [name], male   DOB: 05-May-1960, 62 y.o.   MRN: [id number]         Chief Complaint:: wellness exam and ckd 3a, dm, hld, low b12, low K, smoking       HPI:  [name] is a 61 y.o. male here for wellness exam; pt states will call for yearly eye exam, o/w up to date.  Still smoking, not ready to quit                        Also Pt denies chest pain, increased sob or doe, wheezing, orthopnea, PND, increased LE swelling, palpitations, dizziness or syncope.   Pt denies polydipsia, polyuria, or new focal neuro s/s.    Pt denies fever, wt loss, night sweats, loss of appetite, or other constitutional symptoms     Wt Readings from Last 3 Encounters:  09/19/22 173 lb (78.5 kg)  05/27/22 167 lb (75.8 kg)  04/30/22 184 lb (83.5 kg)   BP Readings from Last 3 Encounters:  09/19/22 120/82  05/27/22 126/81  03/20/22 114/70   Immunization History  Administered Date(s) Administered   Influenza,inj,Quad PF,6+ Mos 12/11/2016, 12/10/2017, 11/26/2018   Influenza-Unspecified 01/15/2015, 01/17/2020, 02/10/2022   Moderna Sars-Covid-2 Vaccination 05/30/2019, 06/30/2019   Pneumococcal Conjugate-13 09/11/2020   Pneumococcal Polysaccharide-23 11/21/2014, 12/18/2015   Td 08/29/2009   Tdap 12/06/2013   Zoster Recombinat (Shingrix) 09/11/2020, 03/20/2022   Health Maintenance Due  Topic Date Due   OPHTHALMOLOGY EXAM  03/27/2020      Past Medical History:  Diagnosis Date   Anginal pain (HCC)    Chest pain 02/13/2012   echo - EF >55%;mod concentric L ventricular hypertrophy; atrial septum aneurysmal; patent foramen ovale suspected on color flow, LA mil/mod dilation; RV systolic pressure   Chest pain 06/09/2007   echo - EF 45-50%; interarterial shunt; mild valvular regurgitation;    Chest pain 06/09/2007   Myoview - EF 53%; normal perfusion all regions;EKG negative for ischemia    Chronic kidney disease    Diabetes mellitus    type II   DIABETES MELLITUS, TYPE II  08/29/2009   GERD (gastroesophageal reflux disease) 08/12/2010   Hemorrhoids    History of anal fissures    Hyperlipidemia    HYPERLIPIDEMIA 08/29/2009   Hypertension    HYPERTENSION 01/14/2008   Lightheadedness 05/10/2002   30d monitor unremarkable   LVH (left ventricular hypertrophy)    with systolic dysfunction by echo 10/03, EF 45-50% - Dr. Elsie Lincoln   Multinodular goiter 12/06/2015   Nonspecific ST-T wave electrocardiographic changes 08/11/2006   Myoview - EF 49%; normal perfusion all regions, EKG negative for ischemia   OSA (obstructive sleep apnea)    Preventative health care 08/11/2010   Sleep apnea 08/13/2008   AHI 1.2; sleep study 10/2007 - AHI 9.47at 15cm H2O and 31.3 at 16cm H2O   SLEEP APNEA, OBSTRUCTIVE 01/14/2008   Past Surgical History:  Procedure Laterality Date   ANAL SPHINCTEROTOMY  08/20/11   CARDIAC CATHETERIZATION  10/23/2008   normal coronary arteries and LV function   COLONOSCOPY     IR RADIOLOGIST EVAL & MGMT  04/12/2021   IR RADIOLOGIST EVAL & MGMT  08/13/2021   IR RADIOLOGIST EVAL & MGMT  09/11/2021   IR RADIOLOGIST EVAL & MGMT  12/04/2021   IR RADIOLOGIST EVAL & MGMT  03/06/2022   POLYPECTOMY     RADIOLOGY WITH ANESTHESIA N/A 05/15/2021   Procedure: MICROWAVE ABLATION;  Surgeon: Roanna Banning, MD;  Location: [facility];  Service: Radiology;  Laterality: N/A;   RADIOLOGY WITH ANESTHESIA Right 11/06/2021   Procedure: IR WITH ANESTHESIA RIGHT RENAL CRYOABLATION;  Surgeon: Roanna Banning, MD;  Location: [facility];  Service: Radiology;  Laterality: Right;    reports that he has been smoking cigarettes. He has a 15.00 pack-year smoking history. He has never used smokeless tobacco. He reports current alcohol use of about 3.0 standard drinks of alcohol per week. He reports that he does not use drugs. family history includes Heart disease in his father; Hypertension in his mother. Allergies  Allergen Reactions   Sulfa Antibiotics Hives and Swelling   Vicodin  [Hydrocodone-Acetaminophen] Itching    Patient states he can take Tylenol without difficulty.   Current Outpatient Medications on File Prior to Visit  Medication Sig Dispense Refill   Blood Glucose Monitoring Suppl (ONE TOUCH ULTRA 2) w/Device KIT Use as directed  E11.9 1 each 0   Lancets MISC Use as directed E11.9 100 each 11   VALIUM 5 MG tablet Take 10 mg by mouth daily.     No current facility-administered medications on file prior to visit.        ROS:  All others reviewed and negative.  Objective        PE:  BP 120/82 (BP Location: Left Arm, Patient Position: Sitting, Cuff Size: Normal)   Pulse 67   Temp 98.6 F (37 C) (Oral)   Ht 6\' 1"  (1.854 m)   Wt 173 lb (78.5 kg)   SpO2 98%   BMI 22.82 kg/m                 Constitutional: Pt appears in NAD               HENT: Head: NCAT.                Right Ear: External ear normal.                 Left Ear: External ear normal.                Eyes: . Pupils are equal, round, and reactive to light. Conjunctivae and EOM are normal               Nose: without d/c or deformity               Neck: Neck supple. Gross normal ROM               Cardiovascular: Normal rate and regular rhythm.                 Pulmonary/Chest: Effort normal and breath sounds without rales or wheezing.                Abd:  Soft, NT, ND, + BS, no organomegaly               Neurological: Pt is alert. At baseline orientation, motor grossly intact               Skin: Skin is warm. No rashes, no other new lesions, LE edema - none               Psychiatric: Pt behavior is normal without agitation   Micro: none  Cardiac tracings I have personally interpreted today:  none  Pertinent Radiological findings (summarize): none   Lab Results  Component Value Date   WBC 6.4 09/17/2022   HGB 13.6 09/17/2022   HCT  40.9 09/17/2022   PLT 229.0 09/17/2022   GLUCOSE 104 (H) 09/17/2022   CHOL 184 09/17/2022   TRIG 137.0 09/17/2022   HDL 56.80 09/17/2022   LDLDIRECT  117.9 01/14/2008   LDLCALC 99 09/17/2022   ALT 10 09/17/2022   AST 16 09/17/2022   NA 139 09/17/2022   K 3.4 (L) 09/17/2022   CL 98 09/17/2022   CREATININE 1.56 (H) 09/17/2022   BUN 16 09/17/2022   CO2 29 09/17/2022   TSH 0.91 09/17/2022   PSA 0.66 09/17/2022   INR 1.0 10/29/2021   HGBA1C 5.8 09/17/2022   MICROALBUR 3.6 (H) 09/17/2022   Assessment/Plan:  [name] is a 62 y.o. Black or African American [2] male with  has a past medical history of Anginal pain (HCC), Chest pain (02/13/2012), Chest pain (06/09/2007), Chest pain (06/09/2007), Chronic kidney disease, Diabetes mellitus, DIABETES MELLITUS, TYPE II (08/29/2009), GERD (gastroesophageal reflux disease) (08/12/2010), Hemorrhoids, History of anal fissures, Hyperlipidemia, HYPERLIPIDEMIA (08/29/2009), Hypertension, HYPERTENSION (01/14/2008), Lightheadedness (05/10/2002), LVH (left ventricular hypertrophy), Multinodular goiter (12/06/2015), Nonspecific ST-T wave electrocardiographic changes (08/11/2006), OSA (obstructive sleep apnea), Preventative health care (08/11/2010), Sleep apnea (08/13/2008), and SLEEP APNEA, OBSTRUCTIVE (01/14/2008).  Encounter for well adult exam with abnormal findings Age and sex appropriate education and counseling updated with regular exercise and diet Referrals for preventative services - pt will call for eye exam Immunizations addressed - none needed Smoking counseling  - none needed Evidence for depression or other mood disorder - none significant Most recent labs reviewed. I have personally reviewed and have noted: 1) the patient's medical and social history 2) The patient's current medications and supplements 3) The patient's height, weight, and BMI have been recorded in the chart   Diabetes [facility]) Lab Results  Component Value Date   HGBA1C 5.8 09/17/2022   Stable, pt to continue current medical treatment  - diet, wt control   Hyperlipidemia Lab Results  Component Value Date    LDLCALC 99 09/17/2022   Uncontrolled, goal ldl < 70. For change pravastatin to crestor 40 mg every day; declines cardiac ct score for now   Essential hypertension BP Readings from Last 3 Encounters:  09/19/22 120/82  05/27/22 126/81  03/20/22 114/70   Stable, pt to continue medical treatment lotrel 10- 40 every day, hct 25 every day, bystolic 20 every day, hydralazine 50 tid   Tobacco abuse Pt counsled to quit, pt not ready,  decliens referral for LDCT screening  CKD (chronic kidney disease) stage 3, GFR 30-59 ml/min (HCC) Lab Results  Component Value Date   CREATININE 1.56 (H) 09/17/2022   Stable overall, cont to avoid nephrotoxins   B12 deficiency Lab Results  Component Value Date   VITAMINB12 242 09/17/2022   Low, to start oral replacement - b12 1000 mcg qd   Hypokalemia Lab Results  Component Value Date   K 3.4 (L) 09/17/2022   Mild persistent low, for increased klor con to 10 meq - 2 qd  Followup: Return in about 6 months (around 03/21/2023).  Oliver Barre, MD 09/21/2022 3:13 PM [facility] [place] [facility]

## 2022-09-21 ENCOUNTER — Encounter: Payer: Self-pay | Admitting: Internal Medicine

## 2022-09-21 NOTE — Assessment & Plan Note (Signed)
Age and sex appropriate education and counseling updated with regular exercise and diet ?Referrals for preventative services - pt will call for eye exam ?Immunizations addressed - none needed ?Smoking counseling  - none needed ?Evidence for depression or other mood disorder - none significant ?Most recent labs reviewed. ?I have personally reviewed and have noted: ?1) the patient's medical and social history ?2) The patient's current medications and supplements ?3) The patient's height, weight, and BMI have been recorded in the chart ? ?

## 2022-09-21 NOTE — Assessment & Plan Note (Signed)
Lab Results  Component Value Date   CREATININE 1.56 (H) 09/17/2022   Stable overall, cont to avoid nephrotoxins

## 2022-09-21 NOTE — Assessment & Plan Note (Signed)
Pt counsled to quit, pt not ready,  decliens referral for LDCT screening

## 2022-09-21 NOTE — Assessment & Plan Note (Signed)
Lab Results  Component Value Date   K 3.4 (L) 09/17/2022   Mild persistent low, for increased klor con to 10 meq - 2 qd

## 2022-09-21 NOTE — Assessment & Plan Note (Signed)
Lab Results  Component Value Date   VITAMINB12 242 09/17/2022   Low, to start oral replacement - b12 1000 mcg qd

## 2022-09-21 NOTE — Assessment & Plan Note (Signed)
BP Readings from Last 3 Encounters:  09/19/22 120/82  05/27/22 126/81  03/20/22 114/70   Stable, pt to continue medical treatment lotrel 10- 40 every day, hct 25 every day, bystolic 20 every day, hydralazine 50 tid

## 2022-09-21 NOTE — Assessment & Plan Note (Signed)
Lab Results  Component Value Date   HGBA1C 5.8 09/17/2022   Stable, pt to continue current medical treatment  - diet, wt control  

## 2022-09-21 NOTE — Assessment & Plan Note (Addendum)
Lab Results  Component Value Date   LDLCALC 99 09/17/2022   Uncontrolled, goal ldl < 70. For change pravastatin to crestor 40 mg every day; declines cardiac ct score for now

## 2022-11-03 ENCOUNTER — Emergency Department (HOSPITAL_COMMUNITY): Payer: 59

## 2022-11-03 ENCOUNTER — Inpatient Hospital Stay (HOSPITAL_COMMUNITY)
Admission: EM | Admit: 2022-11-03 | Discharge: 2022-11-07 | DRG: 684 | Disposition: A | Discharge status: Home / Self Care | Payer: 59 | Source: Ambulatory Visit | Attending: Internal Medicine | Admitting: Internal Medicine

## 2022-11-03 ENCOUNTER — Encounter: Payer: Self-pay | Admitting: Internal Medicine

## 2022-11-03 ENCOUNTER — Encounter (HOSPITAL_COMMUNITY): Payer: Self-pay

## 2022-11-03 ENCOUNTER — Ambulatory Visit: Payer: 59 | Admitting: Internal Medicine

## 2022-11-03 ENCOUNTER — Other Ambulatory Visit: Payer: Self-pay

## 2022-11-03 VITALS — BP 108/68 | HR 75 | Temp 99.0°F | Ht 73.0 in | Wt 166.2 lb

## 2022-11-03 DIAGNOSIS — E119 Type 2 diabetes mellitus without complications: Secondary | ICD-10-CM

## 2022-11-03 DIAGNOSIS — I10 Essential (primary) hypertension: Secondary | ICD-10-CM | POA: Diagnosis not present

## 2022-11-03 DIAGNOSIS — N170 Acute kidney failure with tubular necrosis: Secondary | ICD-10-CM | POA: Diagnosis not present

## 2022-11-03 DIAGNOSIS — R55 Syncope and collapse: Secondary | ICD-10-CM | POA: Insufficient documentation

## 2022-11-03 DIAGNOSIS — Z85528 Personal history of other malignant neoplasm of kidney: Secondary | ICD-10-CM

## 2022-11-03 DIAGNOSIS — R350 Frequency of micturition: Secondary | ICD-10-CM

## 2022-11-03 DIAGNOSIS — E0800 Diabetes mellitus due to underlying condition with hyperosmolarity without nonketotic hyperglycemic-hyperosmolar coma (NKHHC): Secondary | ICD-10-CM

## 2022-11-03 DIAGNOSIS — D49519 Neoplasm of unspecified behavior of unspecified kidney: Secondary | ICD-10-CM

## 2022-11-03 DIAGNOSIS — Z7984 Long term (current) use of oral hypoglycemic drugs: Secondary | ICD-10-CM

## 2022-11-03 DIAGNOSIS — F1010 Alcohol abuse, uncomplicated: Secondary | ICD-10-CM | POA: Diagnosis present

## 2022-11-03 DIAGNOSIS — N179 Acute kidney failure, unspecified: Secondary | ICD-10-CM | POA: Diagnosis not present

## 2022-11-03 DIAGNOSIS — E785 Hyperlipidemia, unspecified: Secondary | ICD-10-CM | POA: Diagnosis present

## 2022-11-03 DIAGNOSIS — G44311 Acute post-traumatic headache, intractable: Secondary | ICD-10-CM | POA: Diagnosis not present

## 2022-11-03 DIAGNOSIS — Z79899 Other long term (current) drug therapy: Secondary | ICD-10-CM

## 2022-11-03 DIAGNOSIS — N1832 Chronic kidney disease, stage 3b: Secondary | ICD-10-CM | POA: Diagnosis present

## 2022-11-03 DIAGNOSIS — N189 Chronic kidney disease, unspecified: Principal | ICD-10-CM | POA: Diagnosis present

## 2022-11-03 DIAGNOSIS — E1122 Type 2 diabetes mellitus with diabetic chronic kidney disease: Secondary | ICD-10-CM | POA: Diagnosis present

## 2022-11-03 DIAGNOSIS — Z885 Allergy status to narcotic agent status: Secondary | ICD-10-CM

## 2022-11-03 DIAGNOSIS — E876 Hypokalemia: Secondary | ICD-10-CM | POA: Diagnosis not present

## 2022-11-03 DIAGNOSIS — N183 Chronic kidney disease, stage 3 unspecified: Secondary | ICD-10-CM | POA: Diagnosis present

## 2022-11-03 DIAGNOSIS — I129 Hypertensive chronic kidney disease with stage 1 through stage 4 chronic kidney disease, or unspecified chronic kidney disease: Secondary | ICD-10-CM | POA: Diagnosis present

## 2022-11-03 DIAGNOSIS — Z794 Long term (current) use of insulin: Secondary | ICD-10-CM | POA: Diagnosis not present

## 2022-11-03 DIAGNOSIS — Z882 Allergy status to sulfonamides status: Secondary | ICD-10-CM

## 2022-11-03 DIAGNOSIS — R351 Nocturia: Secondary | ICD-10-CM | POA: Diagnosis not present

## 2022-11-03 DIAGNOSIS — E860 Dehydration: Secondary | ICD-10-CM | POA: Diagnosis present

## 2022-11-03 LAB — CBC WITH DIFFERENTIAL/PLATELET
Abs Immature Granulocytes: 0.01 10*3/uL (ref 0.00–0.07)
Basophils Absolute: 0.1 10*3/uL (ref 0.0–0.1)
Basophils Absolute: 0 10*3/uL (ref 0.0–0.1)
Basophils Relative: 0.7 % (ref 0.0–3.0)
Basophils Relative: 0 %
Eosinophils Absolute: 0.1 10*3/uL (ref 0.0–0.7)
Eosinophils Absolute: 0.2 10*3/uL (ref 0.0–0.5)
Eosinophils Relative: 1.3 % (ref 0.0–5.0)
Eosinophils Relative: 2 %
HCT: 37 % — ABNORMAL LOW (ref 39.0–52.0)
HCT: 33.8 % — ABNORMAL LOW (ref 39.0–52.0)
Hemoglobin: 12.3 g/dL — ABNORMAL LOW (ref 13.0–17.0)
Hemoglobin: 11.7 g/dL — ABNORMAL LOW (ref 13.0–17.0)
Immature Granulocytes: 0 %
Lymphocytes Relative: 20.9 % (ref 12.0–46.0)
Lymphocytes Relative: 22 %
Lymphs Abs: 2 10*3/uL (ref 0.7–4.0)
Lymphs Abs: 1.9 10*3/uL (ref 0.7–4.0)
MCH: 32.1 pg (ref 26.0–34.0)
MCHC: 33.2 g/dL (ref 30.0–36.0)
MCHC: 34.6 g/dL (ref 30.0–36.0)
MCV: 96 fl (ref 78.0–100.0)
MCV: 92.9 fL (ref 80.0–100.0)
Monocytes Absolute: 0.8 10*3/uL (ref 0.1–1.0)
Monocytes Absolute: 1 10*3/uL (ref 0.1–1.0)
Monocytes Relative: 8.7 % (ref 3.0–12.0)
Monocytes Relative: 11 %
Neutro Abs: 6.6 10*3/uL (ref 1.4–7.7)
Neutro Abs: 5.8 10*3/uL (ref 1.7–7.7)
Neutrophils Relative %: 68.4 % (ref 43.0–77.0)
Neutrophils Relative %: 65 %
Platelets: 230 10*3/uL (ref 150.0–400.0)
Platelets: 208 10*3/uL (ref 150–400)
RBC: 3.86 Mil/uL — ABNORMAL LOW (ref 4.22–5.81)
RBC: 3.64 MIL/uL — ABNORMAL LOW (ref 4.22–5.81)
RDW: 12.9 % (ref 11.5–15.5)
RDW: 12.6 % (ref 11.5–15.5)
WBC: 9.6 10*3/uL (ref 4.0–10.5)
WBC: 8.9 10*3/uL (ref 4.0–10.5)
nRBC: 0 % (ref 0.0–0.2)

## 2022-11-03 LAB — LIPID PANEL
Cholesterol: 119 mg/dL (ref 0–200)
HDL: 61.7 mg/dL (ref >39.00)
LDL Cholesterol: 35 mg/dL (ref 0–99)
NonHDL: 57.74
Total CHOL/HDL Ratio: 2
Triglycerides: 114 mg/dL (ref 0.0–149.0)
VLDL: 22.8 mg/dL (ref 0.0–40.0)

## 2022-11-03 LAB — URINALYSIS, ROUTINE W REFLEX MICROSCOPIC
Bacteria, UA: NONE SEEN
Bilirubin Urine: NEGATIVE
Bilirubin Urine: NEGATIVE
Glucose, UA: >=500 mg/dL — AB
Ketones, ur: NEGATIVE
Ketones, ur: NEGATIVE mg/dL
Leukocytes,Ua: NEGATIVE
Leukocytes,Ua: NEGATIVE
Nitrite: NEGATIVE
Nitrite: NEGATIVE
Protein, ur: 100 mg/dL — AB
Specific Gravity, Urine: >=1.03 — AB (ref 1.000–1.030)
Specific Gravity, Urine: 1.014 (ref 1.005–1.030)
Total Protein, Urine: 100 — AB
Urine Glucose: 500 — AB
Urobilinogen, UA: 0.2 (ref 0.0–1.0)
pH: 6 (ref 5.0–8.0)
pH: 5 (ref 5.0–8.0)

## 2022-11-03 LAB — BASIC METABOLIC PANEL
Anion gap: 15 (ref 5–15)
BUN: 43 mg/dL — ABNORMAL HIGH (ref 6–23)
BUN: 45 mg/dL — ABNORMAL HIGH (ref 8–23)
CO2: 25 mEq/L (ref 19–32)
CO2: 21 mmol/L — ABNORMAL LOW (ref 22–32)
Calcium: 10.3 mg/dL (ref 8.4–10.5)
Calcium: 9.3 mg/dL (ref 8.9–10.3)
Chloride: 102 mEq/L (ref 96–112)
Chloride: 100 mmol/L (ref 98–111)
Creatinine, Ser: 3.72 mg/dL — ABNORMAL HIGH (ref 0.40–1.50)
Creatinine, Ser: 3.59 mg/dL — ABNORMAL HIGH (ref 0.61–1.24)
GFR, Estimated: 18 mL/min — ABNORMAL LOW (ref >60)
GFR: 16.56 mL/min — ABNORMAL LOW (ref >60.00)
Glucose, Bld: 86 mg/dL (ref 70–99)
Glucose, Bld: 111 mg/dL — ABNORMAL HIGH (ref 70–99)
Potassium: 3.3 mEq/L — ABNORMAL LOW (ref 3.5–5.1)
Potassium: 2.8 mmol/L — ABNORMAL LOW (ref 3.5–5.1)
Sodium: 141 mEq/L (ref 135–145)
Sodium: 136 mmol/L (ref 135–145)

## 2022-11-03 LAB — HEPATIC FUNCTION PANEL
ALT: 13 U/L (ref 0–53)
AST: 17 U/L (ref 0–37)
Albumin: 5 g/dL (ref 3.5–5.2)
Alkaline Phosphatase: 68 U/L (ref 39–117)
Bilirubin, Direct: 0.1 mg/dL (ref 0.0–0.3)
Total Bilirubin: 0.6 mg/dL (ref 0.2–1.2)
Total Protein: 8.1 g/dL (ref 6.0–8.3)

## 2022-11-03 LAB — HEMOGLOBIN A1C: Hgb A1c MFr Bld: 6.6 % — ABNORMAL HIGH (ref 4.6–6.5)

## 2022-11-03 LAB — GLUCOSE, CAPILLARY: Glucose-Capillary: 136 mg/dL — ABNORMAL HIGH (ref 70–99)

## 2022-11-03 LAB — LIPASE, BLOOD: Lipase: 33 U/L (ref 11–51)

## 2022-11-03 MED ORDER — ONDANSETRON HCL 4 MG/2ML IJ SOLN: 4.0000 mg | Freq: Four times a day (QID) | INTRAMUSCULAR | Status: DC | PRN

## 2022-11-03 MED ORDER — INSULIN ASPART 100 UNIT/ML IJ SOLN
0.0000 [IU] | Freq: Every day | INTRAMUSCULAR | Status: DC
  Filled 2022-11-03: qty 0.05

## 2022-11-03 MED ORDER — HYDRALAZINE HCL 20 MG/ML IJ SOLN: 10.0000 mg | Freq: Three times a day (TID) | INTRAMUSCULAR | Status: DC | PRN

## 2022-11-03 MED ORDER — MORPHINE SULFATE (PF) 2 MG/ML IV SOLN
1.0000 mg | Freq: Four times a day (QID) | INTRAVENOUS | Status: DC | PRN
  Administered 2022-11-05 – 2022-11-06 (×3): 1 mg via INTRAVENOUS
  Filled 2022-11-03 (×3): qty 1

## 2022-11-03 MED ORDER — HYDRALAZINE HCL 50 MG PO TABS
50.0000 mg | ORAL_TABLET | Freq: Three times a day (TID) | ORAL | Status: DC
  Administered 2022-11-03 – 2022-11-04 (×3): 50 mg via ORAL
  Filled 2022-11-03 (×3): qty 1

## 2022-11-03 MED ORDER — SENNOSIDES-DOCUSATE SODIUM 8.6-50 MG PO TABS: 1.0000 | ORAL_TABLET | Freq: Every evening | ORAL | Status: DC | PRN

## 2022-11-03 MED ORDER — ZOLPIDEM TARTRATE 5 MG PO TABS
5.0000 mg | ORAL_TABLET | Freq: Every evening | ORAL | Status: DC | PRN
  Administered 2022-11-05: 5 mg via ORAL
  Filled 2022-11-03: qty 1

## 2022-11-03 MED ORDER — NICOTINE 14 MG/24HR TD PT24
14.0000 mg | MEDICATED_PATCH | Freq: Every day | TRANSDERMAL | Status: DC
  Administered 2022-11-04: 14 mg via TRANSDERMAL
  Filled 2022-11-03 (×3): qty 1

## 2022-11-03 MED ORDER — INSULIN ASPART 100 UNIT/ML IJ SOLN
0.0000 [IU] | Freq: Three times a day (TID) | INTRAMUSCULAR | Status: DC
  Administered 2022-11-05: 1 [IU] via SUBCUTANEOUS
  Filled 2022-11-03: qty 0.09

## 2022-11-03 MED ORDER — POTASSIUM CHLORIDE CRYS ER 10 MEQ PO TBCR: 10.0000 meq | EXTENDED_RELEASE_TABLET | Freq: Every day | ORAL | Status: DC

## 2022-11-03 MED ORDER — SODIUM CHLORIDE 0.9 % IV SOLN: INTRAVENOUS | Status: DC

## 2022-11-03 MED ORDER — SODIUM CHLORIDE 0.9% FLUSH
3.0000 mL | Freq: Two times a day (BID) | INTRAVENOUS | Status: DC
  Administered 2022-11-05 – 2022-11-06 (×2): 3 mL via INTRAVENOUS

## 2022-11-03 MED ORDER — POTASSIUM CHLORIDE 10 MEQ/100ML IV SOLN
10.0000 meq | INTRAVENOUS | Status: AC
  Administered 2022-11-03 (×3): 10 meq via INTRAVENOUS
  Filled 2022-11-03 (×2): qty 100

## 2022-11-03 MED ORDER — BISACODYL 5 MG PO TBEC: 5.0000 mg | DELAYED_RELEASE_TABLET | Freq: Every day | ORAL | Status: DC | PRN

## 2022-11-03 MED ORDER — PANTOPRAZOLE SODIUM 40 MG PO TBEC
40.0000 mg | DELAYED_RELEASE_TABLET | Freq: Every day | ORAL | Status: DC
  Administered 2022-11-04 – 2022-11-07 (×4): 40 mg via ORAL
  Filled 2022-11-03 (×4): qty 1

## 2022-11-03 MED ORDER — LEVOFLOXACIN 500 MG PO TABS: 500.0000 mg | ORAL_TABLET | Freq: Every day | ORAL | 0 refills | Status: DC

## 2022-11-03 MED ORDER — LEVOFLOXACIN 500 MG PO TABS: 500.0000 mg | ORAL_TABLET | ORAL | Status: DC

## 2022-11-03 MED ORDER — NEBIVOLOL HCL 10 MG PO TABS
20.0000 mg | ORAL_TABLET | Freq: Every day | ORAL | Status: DC
  Administered 2022-11-04 – 2022-11-07 (×4): 20 mg via ORAL
  Filled 2022-11-03 (×4): qty 2

## 2022-11-03 MED ORDER — ENOXAPARIN SODIUM 30 MG/0.3ML IJ SOSY
30.0000 mg | PREFILLED_SYRINGE | INTRAMUSCULAR | Status: DC
  Administered 2022-11-04 – 2022-11-06 (×3): 30 mg via SUBCUTANEOUS
  Filled 2022-11-03 (×3): qty 0.3

## 2022-11-03 MED ORDER — ROSUVASTATIN CALCIUM 20 MG PO TABS
40.0000 mg | ORAL_TABLET | Freq: Every day | ORAL | Status: DC
  Administered 2022-11-04 – 2022-11-07 (×4): 40 mg via ORAL
  Filled 2022-11-03 (×4): qty 2

## 2022-11-03 MED ORDER — SODIUM CHLORIDE 0.9 % IV BOLUS
1000.0000 mL | Freq: Once | INTRAVENOUS | Status: AC
  Administered 2022-11-03: 1000 mL via INTRAVENOUS

## 2022-11-03 MED ORDER — ONDANSETRON HCL 4 MG PO TABS: 4.0000 mg | ORAL_TABLET | Freq: Four times a day (QID) | ORAL | Status: DC | PRN

## 2022-11-03 NOTE — Assessment & Plan Note (Signed)
With low back pain, urinary frequency, nocturia, low grade temp - I suspect underlying UTI likely brought on by farxiga start - for ua and cx, empiric levaquin 500 every day, and d/c farxiga

## 2022-11-03 NOTE — Progress Notes (Signed)
Patient ID: [name], male   DOB: 07-03-1960, 62 y.o.   MRN: [id number]        Chief Complaint: follow up htn low normal recently with fall yesterday to right head and brief LOC, persistent HA, LBP and DM       HPI:  [name] is a 62 y.o. male here with c/o several wks gradually worsening symptoms shortly after starting farixga 10 mg recently, now with urinary frequency and recurring lower back pain that wakes him up at night, better with urination at night. Feels poorly overall, and has had lower po intake without high fever, chills.  Finally dizzy yesterday with witnessed fall face first in the home to right frontal head, has persistent HA today without bruising, swelling or abrasion,.  Not on anticoagulant,  Pt denies polydipsia, polyuria, or new focal neuro s/s otherwise,  Had very brief LOC few seconds only, no seizure activity noted.  Pt denies chest pain, increased sob or doe, wheezing, orthopnea, PND, increased LE swelling, palpitations.  Noted low normal BP today  still smoking, not ready to quit       Wt Readings from Last 3 Encounters:  11/03/22 166 lb 3.2 oz (75.4 kg)  09/19/22 173 lb (78.5 kg)  05/27/22 167 lb (75.8 kg)   BP Readings from Last 3 Encounters:  11/03/22 108/68  09/19/22 120/82  05/27/22 126/81         Past Medical History:  Diagnosis Date   Anginal pain (HCC)    Chest pain 02/13/2012   echo - EF >55%;mod concentric L ventricular hypertrophy; atrial septum aneurysmal; patent foramen ovale suspected on color flow, LA mil/mod dilation; RV systolic pressure   Chest pain 06/09/2007   echo - EF 45-50%; interarterial shunt; mild valvular regurgitation;    Chest pain 06/09/2007   Myoview - EF 53%; normal perfusion all regions;EKG negative for ischemia    Chronic kidney disease    Diabetes mellitus    type II   DIABETES MELLITUS, TYPE II 08/29/2009   GERD (gastroesophageal reflux disease) 08/12/2010   Hemorrhoids    History of anal fissures     Hyperlipidemia    HYPERLIPIDEMIA 08/29/2009   Hypertension    HYPERTENSION 01/14/2008   Lightheadedness 05/10/2002   30d monitor unremarkable   LVH (left ventricular hypertrophy)    with systolic dysfunction by echo 10/03, EF 45-50% - Dr. Elsie Lincoln   Multinodular goiter 12/06/2015   Nonspecific ST-T wave electrocardiographic changes 08/11/2006   Myoview - EF 49%; normal perfusion all regions, EKG negative for ischemia   OSA (obstructive sleep apnea)    Preventative health care 08/11/2010   Sleep apnea 08/13/2008   AHI 1.2; sleep study 10/2007 - AHI 9.47at 15cm H2O and 31.3 at 16cm H2O   SLEEP APNEA, OBSTRUCTIVE 01/14/2008   Past Surgical History:  Procedure Laterality Date   ANAL SPHINCTEROTOMY  08/20/11   CARDIAC CATHETERIZATION  10/23/2008   normal coronary arteries and LV function   COLONOSCOPY     IR RADIOLOGIST EVAL & MGMT  04/12/2021   IR RADIOLOGIST EVAL & MGMT  08/13/2021   IR RADIOLOGIST EVAL & MGMT  09/11/2021   IR RADIOLOGIST EVAL & MGMT  12/04/2021   IR RADIOLOGIST EVAL & MGMT  03/06/2022   POLYPECTOMY     RADIOLOGY WITH ANESTHESIA N/A 05/15/2021   Procedure: MICROWAVE ABLATION;  Surgeon: Roanna Banning, MD;  Location: [facility];  Service: Radiology;  Laterality: N/A;   RADIOLOGY WITH ANESTHESIA Right 11/06/2021   Procedure:  IR WITH ANESTHESIA RIGHT RENAL CRYOABLATION;  Surgeon: Roanna Banning, MD;  Location: [facility];  Service: Radiology;  Laterality: Right;    reports that he has been smoking cigarettes. He has a 15 pack-year smoking history. He has never used smokeless tobacco. He reports current alcohol use of about 3.0 standard drinks of alcohol per week. He reports that he does not use drugs. family history includes Heart disease in his father; Hypertension in his mother. Allergies  Allergen Reactions   Sulfa Antibiotics Hives and Swelling   Vicodin [Hydrocodone-Acetaminophen] Itching    Patient states he can take Tylenol without difficulty.   Current Outpatient Medications on  File Prior to Visit  Medication Sig Dispense Refill   amLODipine-benazepril (LOTREL) 10-40 MG capsule TAKE 1 CAPSULE BY MOUTH EVERY DAY 90 capsule 3   hydrALAZINE (APRESOLINE) 50 MG tablet Take 1 tablet (50 mg total) by mouth 3 (three) times daily. 270 tablet 3   metFORMIN (GLUCOPHAGE-XR) 500 MG 24 hr tablet TAKE 2 TABLETS BY MOUTH EVERY DAY WITH BREAKFAST 180 tablet 3   nebivolol (BYSTOLIC) 10 MG tablet Take 2 tablets (20 mg total) by mouth daily. 180 tablet 3   omeprazole (PRILOSEC) 40 MG capsule TAKE 1 CAPSULE BY MOUTH EVERY DAY 90 capsule 3   rosuvastatin (CRESTOR) 40 MG tablet Take 1 tablet (40 mg total) by mouth daily. 90 tablet 3   VALIUM 5 MG tablet Take 10 mg by mouth daily.     No current facility-administered medications on file prior to visit.        ROS:  All others reviewed and negative.  Objective        PE:  BP 108/68 (BP Location: Left Arm, Patient Position: Sitting, Cuff Size: Normal)   Pulse 75   Temp 99 F (37.2 C) (Oral)   Ht 6\' 1"  (1.854 m)   Wt 166 lb 3.2 oz (75.4 kg)   SpO2 98%   BMI 21.93 kg/m                 Constitutional: Pt appears in NAD, fatigued mild ill appearing               HENT: Head: NCAT.                Right Ear: External ear normal.                 Left Ear: External ear normal.                Eyes: . Pupils are equal, round, and reactive to light. Conjunctivae and EOM are normal               Nose: without d/c or deformity               Neck: Neck supple. Gross normal ROM               Cardiovascular: Normal rate and regular rhythm.                 Pulmonary/Chest: Effort normal and breath sounds without rales or wheezing.                Abd:  Soft, NT, ND, + BS, no organomegaly, no flank tender               Neurological: Pt is alert. At baseline orientation, motor grossly intact               Skin: Skin  is warm. No rashes, no other new lesions, LE edema - none               Psychiatric: Pt behavior is normal without agitation    Micro: none  Cardiac tracings I have personally interpreted today:  none  Pertinent Radiological findings (summarize): none   Lab Results  Component Value Date   WBC 9.6 11/03/2022   HGB 12.3 (L) 11/03/2022   HCT 37.0 (L) 11/03/2022   PLT 230.0 11/03/2022   GLUCOSE 86 11/03/2022   CHOL 119 11/03/2022   TRIG 114.0 11/03/2022   HDL 61.70 11/03/2022   LDLDIRECT 117.9 01/14/2008   LDLCALC 35 11/03/2022   ALT 13 11/03/2022   AST 17 11/03/2022   NA 141 11/03/2022   K 3.3 (L) 11/03/2022   CL 102 11/03/2022   CREATININE 3.72 (H) 11/03/2022   BUN 43 (H) 11/03/2022   CO2 25 11/03/2022   TSH 0.91 09/17/2022   PSA 0.66 09/17/2022   INR 1.0 10/29/2021   HGBA1C 6.6 (H) 11/03/2022   MICROALBUR 3.6 (H) 09/17/2022   Assessment/Plan:  [name] is a 62 y.o. Black or African American [2] male with  has a past medical history of Anginal pain (HCC), Chest pain (02/13/2012), Chest pain (06/09/2007), Chest pain (06/09/2007), Chronic kidney disease, Diabetes mellitus, DIABETES MELLITUS, TYPE II (08/29/2009), GERD (gastroesophageal reflux disease) (08/12/2010), Hemorrhoids, History of anal fissures, Hyperlipidemia, HYPERLIPIDEMIA (08/29/2009), Hypertension, HYPERTENSION (01/14/2008), Lightheadedness (05/10/2002), LVH (left ventricular hypertrophy), Multinodular goiter (12/06/2015), Nonspecific ST-T wave electrocardiographic changes (08/11/2006), OSA (obstructive sleep apnea), Preventative health care (08/11/2010), Sleep apnea (08/13/2008), and SLEEP APNEA, OBSTRUCTIVE (01/14/2008).  Diabetes (HCC) Lab Results  Component Value Date   HGBA1C 6.6 (H) 11/03/2022   Stable, pt to continue current medical treatment metfomrin ER 500 mg - 2 every day but dc farxiga due to events as above   Essential hypertension Low normal today, likley due to reduced po intake, for d/c hct and Klor con, cont other tx and f/u BP at home; to go to ED for any worsening dizzy weakness, falls, pain or other unusual  worsening symptoms  Nocturia With low back pain, urinary frequency, nocturia, low grade temp - I suspect underlying UTI likely brought on by farxiga start - for ua and cx, empiric levaquin 500 every day, and d/c farxiga  Intractable acute post-traumatic headache For stat head CT, with persistent pain, no neuro changes on exam; also for labs as ordered for syncope - declines ecg, carotids, echo for now,  to f/u any worsening symptoms or concerns j  Followup: Return in about 4 weeks (around 12/01/2022).  Oliver Barre, MD 11/03/2022 4:47 PM [facility]  Primary Care - [facility]

## 2022-11-03 NOTE — Assessment & Plan Note (Signed)
Low normal today, likley due to reduced po intake, for d/c hct and Klor con, cont other tx and f/u BP at home; to go to ED for any worsening dizzy weakness, falls, pain or other unusual worsening symptoms

## 2022-11-03 NOTE — Assessment & Plan Note (Addendum)
Lab Results  Component Value Date   HGBA1C 6.6 (H) 11/03/2022   Stable, pt to continue current medical treatment metfomrin ER 500 mg - 2 every day but dc farxiga due to events as above

## 2022-11-03 NOTE — ED Triage Notes (Signed)
Pt referred to our ER by Dr. Oliver Barre pts PCP for a direct admit. Pt was seen for routine lab work and noted that pt is in kidney failure. Pt noted bilateral flank pain in the mornings, and had a syncopal episode yesterday.

## 2022-11-03 NOTE — ED Provider Notes (Signed)
[place] EMERGENCY DEPARTMENT AT [facility] Provider Note   CSN: [id number] Arrival date & time: 11/03/22  1759     History  Chief Complaint  Patient presents with   Abnormal Lab    [name] is a 62 y.o. male history of kidney cancer status post resection, diabetes, here presenting with acute renal failure.  Patient states that he has been having poor appetite for several days.  Patient states that he felt lightheaded and dizzy yesterday and fell and hit his head.  He has continued dizziness so he went to see his doctor.  His doctor obtain labs in the clinic and he was found to have acute renal failure.  His creatinine was 3.5 and baseline is 1.5.  Patient also had an outpatient CT head that was ordered but not performed.  Patient was seen here for acute renal failure and admission.   The history is provided by the patient.       Home Medications Prior to Admission medications   Medication Sig Start Date End Date Taking? Authorizing Provider  amLODipine-benazepril (LOTREL) 10-40 MG capsule TAKE 1 CAPSULE BY MOUTH EVERY DAY 09/19/22   Corwin Levins, MD  hydrALAZINE (APRESOLINE) 50 MG tablet Take 1 tablet (50 mg total) by mouth 3 (three) times daily. 09/19/22   Corwin Levins, MD  levofloxacin (LEVAQUIN) 500 MG tablet Take 1 tablet (500 mg total) by mouth daily for 10 days. 11/03/22 11/13/22  Corwin Levins, MD  metFORMIN (GLUCOPHAGE-XR) 500 MG 24 hr tablet TAKE 2 TABLETS BY MOUTH EVERY DAY WITH BREAKFAST 09/19/22   Corwin Levins, MD  nebivolol (BYSTOLIC) 10 MG tablet Take 2 tablets (20 mg total) by mouth daily. 09/19/22   Corwin Levins, MD  omeprazole (PRILOSEC) 40 MG capsule TAKE 1 CAPSULE BY MOUTH EVERY DAY 09/19/22   Corwin Levins, MD  rosuvastatin (CRESTOR) 40 MG tablet Take 1 tablet (40 mg total) by mouth daily. 09/19/22   Corwin Levins, MD  VALIUM 5 MG tablet Take 10 mg by mouth daily. 02/24/22   [provider]      Allergies    Sulfa antibiotics and  Vicodin [hydrocodone-acetaminophen]    Review of Systems   Review of Systems  Constitutional:  Positive for fatigue.  Neurological:  Positive for headaches.  All other systems reviewed and are negative.   Physical Exam Updated Vital Signs BP 115/79 (BP Location: Left Arm)   Pulse 69   Temp 98.9 F (37.2 C) (Oral)   Resp 16   Ht 6\' 1"  (1.854 m)   SpO2 100%   BMI 21.93 kg/m  Physical Exam Vitals and nursing note reviewed.  Constitutional:      Comments: Slightly dehydrated  HENT:     Head: Normocephalic.     Nose: Nose normal.     Mouth/Throat:     Mouth: Mucous membranes are moist.  Eyes:     Extraocular Movements: Extraocular movements intact.     Pupils: Pupils are equal, round, and reactive to light.  Cardiovascular:     Rate and Rhythm: Normal rate and regular rhythm.     Pulses: Normal pulses.     Heart sounds: Normal heart sounds.  Pulmonary:     Effort: Pulmonary effort is normal.     Breath sounds: Normal breath sounds.  Abdominal:     General: Abdomen is flat.     Palpations: Abdomen is soft.  Musculoskeletal:        General:  Normal range of motion.     Cervical back: Normal range of motion and neck supple.  Skin:    General: Skin is warm.     Capillary Refill: Capillary refill takes less than 2 seconds.  Neurological:     General: No focal deficit present.     Mental Status: He is alert and oriented to person, place, and time.  Psychiatric:        Mood and Affect: Mood normal.        Behavior: Behavior normal.     ED Results / Procedures / Treatments   Labs (all labs ordered are listed, but only abnormal results are displayed) Labs Reviewed  CBC WITH DIFFERENTIAL/PLATELET - Abnormal; Notable for the following components:      Result Value   RBC 3.64 (*)    Hemoglobin 11.7 (*)    HCT 33.8 (*)    All other components within normal limits  BASIC METABOLIC PANEL - Abnormal; Notable for the following components:   Potassium 2.8 (*)    CO2 21  (*)    Glucose, Bld 111 (*)    BUN 45 (*)    Creatinine, Ser 3.59 (*)    GFR, Estimated 18 (*)    All other components within normal limits  LIPASE, BLOOD  URINALYSIS, ROUTINE W REFLEX MICROSCOPIC    EKG None  Radiology CT HEAD WO CONTRAST ( )  Result Date: 11/03/2022 CLINICAL DATA:  Syncope EXAM: CT HEAD WITHOUT CONTRAST CT CERVICAL SPINE WITHOUT CONTRAST TECHNIQUE: Multidetector CT imaging of the head and cervical spine was performed following the standard protocol without intravenous contrast. Multiplanar CT image reconstructions of the cervical spine were also generated. RADIATION DOSE REDUCTION: This exam was performed according to the departmental dose-optimization program which includes automated exposure control, adjustment of the mA and/or kV according to patient size and/or use of iterative reconstruction technique. COMPARISON:  None Available. FINDINGS: CT HEAD FINDINGS Brain: There is no mass, hemorrhage or extra-axial collection. The size and configuration of the ventricles and extra-axial CSF spaces are normal. The brain parenchyma is normal, without evidence of acute or chronic infarction. Vascular: Atherosclerotic calcification of the internal carotid arteries at the skull base. No abnormal hyperdensity of the major intracranial arteries or dural venous sinuses. Skull: The visualized skull base, calvarium and extracranial soft tissues are normal. Sinuses/Orbits: No fluid levels or advanced mucosal thickening of the visualized paranasal sinuses. No mastoid or middle ear effusion. The orbits are normal. CT CERVICAL SPINE FINDINGS Alignment: No static subluxation. Facets are aligned. Occipital condyles are normally positioned. Skull base and vertebrae: No acute fracture. Soft tissues and spinal canal: No prevertebral fluid or swelling. No visible canal hematoma. Disc levels: Multilevel facet arthrosis. No spinal canal stenosis or high-grade foraminal stenosis. Upper chest: No  pneumothorax, pulmonary nodule or pleural effusion. Other: Normal visualized paraspinal cervical soft tissues. IMPRESSION: 1. No acute intracranial abnormality. 2. No acute fracture or static subluxation of the cervical spine. Electronically Signed   By: Deatra Robinson M.D.   On: 11/03/2022 19:31   CT Cervical Spine Wo Contrast  Result Date: 11/03/2022 CLINICAL DATA:  Syncope EXAM: CT HEAD WITHOUT CONTRAST CT CERVICAL SPINE WITHOUT CONTRAST TECHNIQUE: Multidetector CT imaging of the head and cervical spine was performed following the standard protocol without intravenous contrast. Multiplanar CT image reconstructions of the cervical spine were also generated. RADIATION DOSE REDUCTION: This exam was performed according to the departmental dose-optimization program which includes automated exposure control, adjustment of the mA and/or kV  according to patient size and/or use of iterative reconstruction technique. COMPARISON:  None Available. FINDINGS: CT HEAD FINDINGS Brain: There is no mass, hemorrhage or extra-axial collection. The size and configuration of the ventricles and extra-axial CSF spaces are normal. The brain parenchyma is normal, without evidence of acute or chronic infarction. Vascular: Atherosclerotic calcification of the internal carotid arteries at the skull base. No abnormal hyperdensity of the major intracranial arteries or dural venous sinuses. Skull: The visualized skull base, calvarium and extracranial soft tissues are normal. Sinuses/Orbits: No fluid levels or advanced mucosal thickening of the visualized paranasal sinuses. No mastoid or middle ear effusion. The orbits are normal. CT CERVICAL SPINE FINDINGS Alignment: No static subluxation. Facets are aligned. Occipital condyles are normally positioned. Skull base and vertebrae: No acute fracture. Soft tissues and spinal canal: No prevertebral fluid or swelling. No visible canal hematoma. Disc levels: Multilevel facet arthrosis. No spinal  canal stenosis or high-grade foraminal stenosis. Upper chest: No pneumothorax, pulmonary nodule or pleural effusion. Other: Normal visualized paraspinal cervical soft tissues. IMPRESSION: 1. No acute intracranial abnormality. 2. No acute fracture or static subluxation of the cervical spine. Electronically Signed   By: Deatra Robinson M.D.   On: 11/03/2022 19:31    Procedures Procedures    Medications Ordered in ED Medications  potassium chloride 10 mEq in 100 mL IVPB (has no administration in time range)  sodium chloride 0.9 % bolus 1,000 mL (1,000 mLs Intravenous New Bag/Given 11/03/22 1847)    ED Course/ Medical Decision Making/ A&P                                 Medical Decision Making Demorio Murie is a 62 y.o. male here presenting with acute renal failure.  Patient had poor appetite for several days.  I think the acute renal failure likely from dehydration.  Plan to get CT to rule out hydronephrosis.  Plan to also recheck labs and hydrate and also get CT head since he had a head injury.  8:19 PM I reviewed patient's labs and creatinine is 3.5.  BUN is also elevated at 45 and potassium is 2.8.  CT head unremarkable and CT renal stone did not show any hydronephrosis.  I think patient likely has dehydration causing his acute renal failure.  Patient was given IV fluids.   Problems Addressed: Acute renal failure superimposed on chronic kidney disease, unspecified acute renal failure type, unspecified CKD stage (HCC): acute illness or injury Hypokalemia: acute illness or injury  Amount and/or Complexity of Data Reviewed Labs: ordered. Decision-making details documented in ED Course. Radiology: ordered and independent interpretation performed. Decision-making details documented in ED Course.  Risk Prescription drug management.    Final Clinical Impression(s) / ED Diagnoses Final diagnoses:  None    Rx / DC Orders ED Discharge Orders     None         Charlynne Pander,  MD 11/03/22 2022

## 2022-11-03 NOTE — Patient Instructions (Addendum)
Ok to STOP the [place], HCT fluid pill, and potassium chloride for now  Please take all new medication as prescribed -the antibiotic Levaquin  Please continue all other medications as before, but drink more fluids in the next few days  You are given the work note today  Please have the pharmacy call with any other refills you may need  Please keep your appointments with your specialists as you may have planned, and go to the ED for any worsening pain or dizziness with falls or other unusual symptoms  You will be contacted regarding the referral for: Head CT  - stat  Please go to the LAB at the blood drawing area for the tests to be done  You will be contacted by phone if any changes need to be made immediately.  Otherwise, you will receive a letter about your results with an explanation, but please check with MyChart first.  Please remember to sign up for MyChart if you have not done so, as this will be important to you in the future with finding out test results, communicating by private email, and scheduling acute appointments online when needed.  Please make an Appointment to return in 1 months, or sooner if needed

## 2022-11-03 NOTE — Progress Notes (Signed)
PHARMACY NOTE:  ANTIMICROBIAL RENAL DOSAGE ADJUSTMENT  Current antimicrobial regimen includes a mismatch between antimicrobial dosage and estimated renal function.  As per policy approved by the Pharmacy & Therapeutics and Medical Executive Committees, the antimicrobial dosage will be adjusted accordingly.  Current antimicrobial dosage:  levaquin 500mg  po once daily  Indication:   Renal Function:  Estimated Creatinine Clearance: 22.5 mL/min (A) (by C-G formula based on SCr of 3.59 mg/dL (H)). []      On intermittent HD, scheduled: []      On CRRT    Antimicrobial dosage has been changed to:  levaquin 500mg  po q48h  Additional comments:   Thank you for allowing pharmacy to be a part of this patient's care.  Arley Phenix RPh 11/03/2022, 10:59 PM

## 2022-11-03 NOTE — Assessment & Plan Note (Addendum)
For stat head CT, with persistent pain, no neuro changes on exam; also for labs as ordered for syncope - declines ecg, carotids, echo for now,  to f/u any worsening symptoms or concerns j

## 2022-11-03 NOTE — Telephone Encounter (Signed)
Spoke to pt by phone -   Pt has severe AKI on CKD, I suspect due to low volume and/or infection vs other  Pt is directed to go to [facility] ED now

## 2022-11-04 ENCOUNTER — Observation Stay (HOSPITAL_COMMUNITY): Payer: 59

## 2022-11-04 ENCOUNTER — Inpatient Hospital Stay (HOSPITAL_COMMUNITY): Payer: 59

## 2022-11-04 DIAGNOSIS — I10 Essential (primary) hypertension: Secondary | ICD-10-CM | POA: Diagnosis not present

## 2022-11-04 DIAGNOSIS — N179 Acute kidney failure, unspecified: Secondary | ICD-10-CM | POA: Diagnosis not present

## 2022-11-04 DIAGNOSIS — Z885 Allergy status to narcotic agent status: Secondary | ICD-10-CM | POA: Diagnosis not present

## 2022-11-04 DIAGNOSIS — Z7984 Long term (current) use of oral hypoglycemic drugs: Secondary | ICD-10-CM | POA: Diagnosis not present

## 2022-11-04 DIAGNOSIS — D49519 Neoplasm of unspecified behavior of unspecified kidney: Secondary | ICD-10-CM | POA: Diagnosis not present

## 2022-11-04 DIAGNOSIS — N1831 Chronic kidney disease, stage 3a: Secondary | ICD-10-CM | POA: Diagnosis not present

## 2022-11-04 DIAGNOSIS — N170 Acute kidney failure with tubular necrosis: Secondary | ICD-10-CM | POA: Diagnosis present

## 2022-11-04 DIAGNOSIS — Z882 Allergy status to sulfonamides status: Secondary | ICD-10-CM | POA: Diagnosis not present

## 2022-11-04 DIAGNOSIS — N171 Acute kidney failure with acute cortical necrosis: Secondary | ICD-10-CM | POA: Diagnosis not present

## 2022-11-04 DIAGNOSIS — N1832 Chronic kidney disease, stage 3b: Secondary | ICD-10-CM | POA: Diagnosis present

## 2022-11-04 DIAGNOSIS — E785 Hyperlipidemia, unspecified: Secondary | ICD-10-CM | POA: Diagnosis present

## 2022-11-04 DIAGNOSIS — I129 Hypertensive chronic kidney disease with stage 1 through stage 4 chronic kidney disease, or unspecified chronic kidney disease: Secondary | ICD-10-CM | POA: Diagnosis present

## 2022-11-04 DIAGNOSIS — E860 Dehydration: Secondary | ICD-10-CM | POA: Diagnosis present

## 2022-11-04 DIAGNOSIS — Z79899 Other long term (current) drug therapy: Secondary | ICD-10-CM | POA: Diagnosis not present

## 2022-11-04 DIAGNOSIS — F1010 Alcohol abuse, uncomplicated: Secondary | ICD-10-CM | POA: Diagnosis present

## 2022-11-04 DIAGNOSIS — E876 Hypokalemia: Secondary | ICD-10-CM | POA: Diagnosis present

## 2022-11-04 DIAGNOSIS — N189 Chronic kidney disease, unspecified: Secondary | ICD-10-CM | POA: Diagnosis not present

## 2022-11-04 DIAGNOSIS — Z85528 Personal history of other malignant neoplasm of kidney: Secondary | ICD-10-CM | POA: Diagnosis not present

## 2022-11-04 DIAGNOSIS — E1122 Type 2 diabetes mellitus with diabetic chronic kidney disease: Secondary | ICD-10-CM | POA: Diagnosis present

## 2022-11-04 LAB — GLUCOSE, CAPILLARY
Glucose-Capillary: 108 mg/dL — ABNORMAL HIGH (ref 70–99)
Glucose-Capillary: 105 mg/dL — ABNORMAL HIGH (ref 70–99)
Glucose-Capillary: 97 mg/dL (ref 70–99)
Glucose-Capillary: 114 mg/dL — ABNORMAL HIGH (ref 70–99)

## 2022-11-04 MED ORDER — GADOBUTROL 1 MMOL/ML IV SOLN
7.5000 mL | Freq: Once | INTRAVENOUS | Status: AC | PRN
  Administered 2022-11-04: 7.5 mL via INTRAVENOUS

## 2022-11-04 MED ORDER — ORAL CARE MOUTH RINSE: 15.0000 mL | OROMUCOSAL | Status: DC | PRN

## 2022-11-04 MED ORDER — POTASSIUM CHLORIDE CRYS ER 20 MEQ PO TBCR
40.0000 meq | EXTENDED_RELEASE_TABLET | Freq: Three times a day (TID) | ORAL | Status: AC
  Administered 2022-11-04 – 2022-11-05 (×4): 40 meq via ORAL
  Filled 2022-11-04 (×4): qty 2

## 2022-11-04 MED ORDER — LORAZEPAM 2 MG/ML IJ SOLN
1.0000 mg | Freq: Once | INTRAMUSCULAR | Status: AC
  Administered 2022-11-04: 1 mg via INTRAVENOUS
  Filled 2022-11-04: qty 1

## 2022-11-04 NOTE — Progress Notes (Signed)
   11/04/22 2018  BiPAP/CPAP/SIPAP  BiPAP/CPAP/SIPAP Pt Type Adult  Reason BIPAP/CPAP not in use Non-compliant

## 2022-11-04 NOTE — Progress Notes (Signed)
RN ask patient if he wanted to wear CPAP patient refused for tonight.

## 2022-11-04 NOTE — Plan of Care (Signed)

## 2022-11-04 NOTE — Discharge Instructions (Signed)

## 2022-11-04 NOTE — Progress Notes (Signed)
Initial Nutrition Assessment  INTERVENTION:   -Will provide "High Calorie, High Protein" handout in AVS   -Pt declines protein supplements at this time  NUTRITION DIAGNOSIS:   Inadequate oral intake related to decreased appetite as evidenced by per patient/family report.  GOAL:   Patient will meet greater than or equal to 90% of their needs  MONITOR:   PO intake, Labs, Weight trends, I & O's  REASON FOR ASSESSMENT:   Consult Assessment of nutrition requirement/status, Poor PO  ASSESSMENT:   62 year old with history of renal cell carcinoma status post resection, CKD stage IIIb with known creatinine of 1.6-1.7, diabetes, GERD, hypertension and hyperlipidemia, sleep apnea sent to the emergency room due to AKI from PCP office.  Patient does have poor appetite recently especially after starting [place].  Patient in room, states he feels very hungry and is waiting on his daughter to bring him some breakfast. Pt states since last year after his surgery for RCC he has had a decrease in appetite and rarely finishes meals. He has noticed his clothes fitting looser over the past year. He denies any issues with swallowing, chewing or taste. Recommended protein shakes to patient and he politely refuses them. Would rather focus on increasing kcals and protein with diet. Have placed handout in AVS.   Pt reports he weighed himself at home this week and he weighed ~166 lbs.  Per weight records, pt has lost 17 lbs since 1/31 (9% wt loss x 6 months, insignificant for time frame).   Medications: KLOR-CON  Labs reviewed: CBGs: 105-136 Low K Low Mg   NUTRITION - FOCUSED PHYSICAL EXAM:  Flowsheet Row Most Recent Value  Orbital Region Mild depletion  Upper Arm Region Mild depletion  Thoracic and Lumbar Region Unable to assess  Buccal Region Mild depletion  Temple Region Mild depletion  Clavicle Bone Region No depletion  Clavicle and Acromion Bone Region No depletion  Scapular Bone Region  No depletion  Dorsal Hand No depletion  Patellar Region No depletion  Anterior Thigh Region No depletion  Posterior Calf Region No depletion  Edema (RD Assessment) None  Hair Unable to assess  Eyes Reviewed  Mouth Reviewed  Skin Reviewed       Diet Order:   Diet Order             Diet heart healthy/carb modified Room service appropriate? Yes; Fluid consistency: Thin  Diet effective now                   EDUCATION NEEDS:   Education needs have been addressed  Skin:  Skin Assessment: Reviewed RN Assessment  Last BM:  8/5  Height:   Ht Readings from Last 1 Encounters:  11/04/22 6\' 1"  (1.854 m)    Weight:   Wt Readings from Last 1 Encounters:  11/04/22 75.4 kg   BMI:  Body mass index is 21.93 kg/m.  Estimated Nutritional Needs:   Kcal:  2000-2200  Protein:  80-90g  Fluid:  2L/day   Tilda Franco, MS, RD, LDN Inpatient Clinical Dietitian Contact information available via Amion

## 2022-11-04 NOTE — Progress Notes (Signed)
PROGRESS NOTE    [name]  ZOX:[id number] DOB: 05-15-1960 DOA: 11/03/2022 PCP: Jonathan Levins, MD    Brief Narrative:  62 year old with history of renal cell carcinoma status post resection, CKD stage IIIb with known creatinine of 1.6-1.7, diabetes, GERD, hypertension and hyperlipidemia, sleep apnea sent to the emergency room due to AKI from PCP office.  Patient does have poor appetite recently especially after starting [place].  He had complained of dizziness at home.  Went to primary care doctor's office with the symptoms, started on Levaquin.  Investigation showed creatinine of 3.7 so sent to ER.  Patient takes metformin, ACE inhibitors at home.  Admitted with IV fluid, already stabilizing.   Assessment & Plan:   Acute kidney injury with history of chronic kidney disease stage IIIb: Known baseline 1.6. 3.7 at PCP office-3.3-3.02 today.  ATN due to prerenal injury.  Urine output is adequate.  Renal ultrasound without evidence of hydronephrosis or urinary obstruction.  No postvoid residual urine. Continue maintenance IV fluids.  Intake output monitoring.  Renal function monitoring.  Electrolyte replacement.  Holding metformin and ACE inhibitor's.  May avoid altogether.  Recheck tomorrow morning.  Dizziness and syncope,: Likely secondary to above.  Currently stable.  Check orthostatic symptoms and blood pressures.  Hypokalemia: Persistent.  Potassium 2.6.  Aggressively replace and monitor levels.  Hypomagnesemia: Replaced.  Monitor level.  Suspected UTI: Less likely.  Patient does not have any UTI symptoms.  Discontinue Levaquin to further protect kidneys.  Urine culture is done.  Can wait for results.  Type 2 diabetes: On metformin and Januvia.  Holding them.  SSI.  Essential hypertension: Continue Bystolic and hydralazine.  Holding benazepril.   DVT prophylaxis: enoxaparin (LOVENOX) injection 30 mg Start: 11/04/22 1000 SCDs Start: 11/03/22 2257   Code Status: Full  code Family Communication: Daughter on the phone Disposition Plan: Status is: Observation The patient will require care spanning > 2 midnights and should be moved to inpatient because: Significant abnormal labs, IV fluids and monitoring.     Consultants:  None  Procedures:  None  Antimicrobials:  Levaquin, stopped   Subjective: Patient seen and examined in the morning rounds.  Denies any complaints.  Denies any dizziness lightheadedness on mobility.  Objective: Vitals:   11/03/22 2340 11/04/22 0018 11/04/22 0329 11/04/22 0743  BP: 117/81  120/80 112/76  Pulse: 83  72 72  Resp:   14 18  Temp:   98.3 F (36.8 C) 98.4 F (36.9 C)  TempSrc:   Oral Oral  SpO2: 100%  100% 98%  Weight:  75.4 kg    Height:  6\' 1"  (1.854 m)      Intake/Output Summary (Last 24 hours) at 11/04/2022 1111 Last data filed at 11/04/2022 1104 Gross per 24 hour  Intake 1574.81 ml  Output 1900 ml  Net -325.19 ml   Filed Weights   11/04/22 0018  Weight: 75.4 kg    Examination:  General exam: Appears calm and comfortable .  Mucous membranes are moist. Respiratory system: No added sounds. Cardiovascular system: S1 & S2 heard, RRR.  Gastrointestinal system: Abdomen is nondistended, soft and nontender. No organomegaly or masses felt. Normal bowel sounds heard. No suprapubic tenderness. Central nervous system: Alert and oriented. No focal neurological deficits. Extremities: Symmetric 5 x 5 power. Skin: No rashes, lesions or ulcers Psychiatry: Judgement and insight appear normal. Mood & affect appropriate.     Data Reviewed: I have personally reviewed following labs and imaging studies  CBC: Recent Labs  Lab 11/03/22 1158 11/03/22 1835 11/03/22 2350 11/04/22 0514  WBC 9.6 8.9 7.8 6.9  NEUTROABS 6.6 5.8  --   --   HGB 12.3* 11.7* 10.8* 10.3*  HCT 37.0* 33.8* 31.0* 29.8*  MCV 96.0 92.9 94.2 92.5  PLT 230.0 208 191 176   Basic Metabolic Panel: Recent Labs  Lab 11/03/22 1158  11/03/22 1835 11/03/22 2350 11/04/22 0514  NA 141 136  --  138  K 3.3* 2.8*  --  2.6*  CL 102 100  --  106  CO2 25 21*  --  22  GLUCOSE 86 111*  --  100*  BUN 43* 45*  --  38*  CREATININE 3.72* 3.59* 3.37* 3.02*  CALCIUM 10.3 9.3  --  8.7*  MG  --   --  1.6*  --    GFR: Estimated Creatinine Clearance: 26.7 mL/min (A) (by C-G formula based on SCr of 3.02 mg/dL (H)). Liver Function Tests: Recent Labs  Lab 11/03/22 1158 11/04/22 0514  AST 17 13*  ALT 13 12  ALKPHOS 68 55  BILITOT 0.6 0.6  PROT 8.1 6.3*  ALBUMIN 5.0 3.6   Recent Labs  Lab 11/03/22 1835  LIPASE 33   No results for input(s): "AMMONIA" in the last 168 hours. Coagulation Profile: No results for input(s): "INR", "PROTIME" in the last 168 hours. Cardiac Enzymes: No results for input(s): "CKTOTAL", "CKMB", "CKMBINDEX", "TROPONINI" in the last 168 hours. BNP (last 3 results) No results for input(s): "PROBNP" in the last 8760 hours. HbA1C: Recent Labs    11/03/22 1158  HGBA1C 6.6*   CBG: Recent Labs  Lab 11/03/22 2326 11/04/22 0740  GLUCAP 136* 108*   Lipid Profile: Recent Labs    11/03/22 1158  CHOL 119  HDL 61.70  LDLCALC 35  TRIG 114.0  CHOLHDL 2   Thyroid Function Tests: Recent Labs    11/03/22 2350  TSH 0.604   Anemia Panel: No results for input(s): "VITAMINB12", "FOLATE", "FERRITIN", "TIBC", "IRON", "RETICCTPCT" in the last 72 hours. Sepsis Labs: No results for input(s): "PROCALCITON", "LATICACIDVEN" in the last 168 hours.  No results found for this or any previous visit (from the past 240 hour(s)).       Radiology Studies: US RENAL  Result Date: 11/04/2022 CLINICAL DATA:  Acute kidney injury EXAM: RENAL / URINARY TRACT ULTRASOUND COMPLETE COMPARISON:  Renal stone protocol CT 11/03/2022 FINDINGS: Right Kidney: Renal measurements: 10.1 x 5.1 x 6.3 cm = volume: 170 mL. Mild increased echogenicity of the renal cortex without significant thinning. No hydronephrosis. 9 mm simple  cyst does not require dedicated imaging surveillance. Left Kidney: Renal measurements: 11.0 x 7.3 x 6.2 cm = volume: 261 mL. Mild increased echogenicity the renal cortex without significant thinning. No mass or hydronephrosis visualized. Bladder: Mild diffuse bladder wall thickening may be due to chronic outlet obstruction or cystitis. It is not significantly changed since the CT examination. Other: None. IMPRESSION: Mild increased echogenicity of the renal cortices without significant thinning consistent with medical renal disease. Electronically Signed   By: Acquanetta Belling M.D.   On: 11/04/2022 08:45   CT Renal Stone Study  Result Date: 11/03/2022 CLINICAL DATA:  Bilateral flank pain found to have acute renal injury EXAM: CT ABDOMEN AND PELVIS WITHOUT CONTRAST TECHNIQUE: Multidetector CT imaging of the abdomen and pelvis was performed following the standard protocol without IV contrast. RADIATION DOSE REDUCTION: This exam was performed according to the departmental dose-optimization program which includes automated exposure control, adjustment of the mA and/or  kV according to patient size and/or use of iterative reconstruction technique. COMPARISON:  MR abdomen dated 06/14/2022 FINDINGS: Lower chest: No focal consolidation or pulmonary nodule in the lung bases. No pleural effusion or pneumothorax demonstrated. Partially imaged heart size is normal. Hepatobiliary: Scattered hepatic cysts. No intra or extrahepatic biliary ductal dilation. Normal gallbladder. Pancreas: No focal lesions or main ductal dilation. Spleen: Normal in size without focal abnormality. Adrenals/Urinary Tract: No adrenal nodules. Bilateral renal ablative changes. No suspicious renal mass, calculi or hydronephrosis. Diffuse mural thickening of the urinary bladder. Stomach/Bowel: Normal appearance of the stomach. No evidence of bowel wall thickening, distention, or inflammatory changes. Colonic diverticulosis without acute diverticulitis. Normal  appendix. Vascular/Lymphatic: Aortic atherosclerosis. No enlarged abdominal or pelvic lymph nodes. Reproductive: Enlargement of the prostate with median lobe hypertrophy. Other: No free fluid, fluid collection, or free air. Musculoskeletal: No acute or abnormal lytic or blastic osseous lesions. IMPRESSION: 1. Diffuse mural thickening of the urinary bladder, which may be due to chronic outlet obstruction secondary to prostate enlargement. 2. Bilateral renal ablative changes. No suspicious renal mass by noncontrast technique, calculi, or hydronephrosis. 3.  Aortic Atherosclerosis (ICD10-I70.0). Electronically Signed   By: Agustin Cree M.D.   On: 11/03/2022 19:49   CT HEAD WO CONTRAST ( )  Result Date: 11/03/2022 CLINICAL DATA:  Syncope EXAM: CT HEAD WITHOUT CONTRAST CT CERVICAL SPINE WITHOUT CONTRAST TECHNIQUE: Multidetector CT imaging of the head and cervical spine was performed following the standard protocol without intravenous contrast. Multiplanar CT image reconstructions of the cervical spine were also generated. RADIATION DOSE REDUCTION: This exam was performed according to the departmental dose-optimization program which includes automated exposure control, adjustment of the mA and/or kV according to patient size and/or use of iterative reconstruction technique. COMPARISON:  None Available. FINDINGS: CT HEAD FINDINGS Brain: There is no mass, hemorrhage or extra-axial collection. The size and configuration of the ventricles and extra-axial CSF spaces are normal. The brain parenchyma is normal, without evidence of acute or chronic infarction. Vascular: Atherosclerotic calcification of the internal carotid arteries at the skull base. No abnormal hyperdensity of the major intracranial arteries or dural venous sinuses. Skull: The visualized skull base, calvarium and extracranial soft tissues are normal. Sinuses/Orbits: No fluid levels or advanced mucosal thickening of the visualized paranasal sinuses. No mastoid  or middle ear effusion. The orbits are normal. CT CERVICAL SPINE FINDINGS Alignment: No static subluxation. Facets are aligned. Occipital condyles are normally positioned. Skull base and vertebrae: No acute fracture. Soft tissues and spinal canal: No prevertebral fluid or swelling. No visible canal hematoma. Disc levels: Multilevel facet arthrosis. No spinal canal stenosis or high-grade foraminal stenosis. Upper chest: No pneumothorax, pulmonary nodule or pleural effusion. Other: Normal visualized paraspinal cervical soft tissues. IMPRESSION: 1. No acute intracranial abnormality. 2. No acute fracture or static subluxation of the cervical spine. Electronically Signed   By: Deatra Robinson M.D.   On: 11/03/2022 19:31   CT Cervical Spine Wo Contrast  Result Date: 11/03/2022 CLINICAL DATA:  Syncope EXAM: CT HEAD WITHOUT CONTRAST CT CERVICAL SPINE WITHOUT CONTRAST TECHNIQUE: Multidetector CT imaging of the head and cervical spine was performed following the standard protocol without intravenous contrast. Multiplanar CT image reconstructions of the cervical spine were also generated. RADIATION DOSE REDUCTION: This exam was performed according to the departmental dose-optimization program which includes automated exposure control, adjustment of the mA and/or kV according to patient size and/or use of iterative reconstruction technique. COMPARISON:  None Available. FINDINGS: CT HEAD FINDINGS Brain: There is no mass,  hemorrhage or extra-axial collection. The size and configuration of the ventricles and extra-axial CSF spaces are normal. The brain parenchyma is normal, without evidence of acute or chronic infarction. Vascular: Atherosclerotic calcification of the internal carotid arteries at the skull base. No abnormal hyperdensity of the major intracranial arteries or dural venous sinuses. Skull: The visualized skull base, calvarium and extracranial soft tissues are normal. Sinuses/Orbits: No fluid levels or advanced  mucosal thickening of the visualized paranasal sinuses. No mastoid or middle ear effusion. The orbits are normal. CT CERVICAL SPINE FINDINGS Alignment: No static subluxation. Facets are aligned. Occipital condyles are normally positioned. Skull base and vertebrae: No acute fracture. Soft tissues and spinal canal: No prevertebral fluid or swelling. No visible canal hematoma. Disc levels: Multilevel facet arthrosis. No spinal canal stenosis or high-grade foraminal stenosis. Upper chest: No pneumothorax, pulmonary nodule or pleural effusion. Other: Normal visualized paraspinal cervical soft tissues. IMPRESSION: 1. No acute intracranial abnormality. 2. No acute fracture or static subluxation of the cervical spine. Electronically Signed   By: Deatra Robinson M.D.   On: 11/03/2022 19:31        Scheduled Meds:  enoxaparin (LOVENOX) injection  30 mg Subcutaneous Q24H   hydrALAZINE  50 mg Oral TID   insulin aspart  0-5 Units Subcutaneous QHS   insulin aspart  0-9 Units Subcutaneous TID WC   nebivolol  20 mg Oral Daily   nicotine  14 mg Transdermal Daily   pantoprazole  40 mg Oral Daily   potassium chloride  40 mEq Oral TID   rosuvastatin  40 mg Oral Daily   sodium chloride flush  3 mL Intravenous Q12H   Continuous Infusions:  sodium chloride 100 mL/hr at 11/04/22 0849     LOS: 0 days    Time spent: 35 minutes    Dorcas Carrow, MD [facility]

## 2022-11-05 ENCOUNTER — Other Ambulatory Visit: Payer: 59

## 2022-11-05 DIAGNOSIS — E876 Hypokalemia: Secondary | ICD-10-CM | POA: Diagnosis not present

## 2022-11-05 DIAGNOSIS — N179 Acute kidney failure, unspecified: Secondary | ICD-10-CM | POA: Diagnosis not present

## 2022-11-05 DIAGNOSIS — N170 Acute kidney failure with tubular necrosis: Secondary | ICD-10-CM | POA: Diagnosis not present

## 2022-11-05 DIAGNOSIS — D49519 Neoplasm of unspecified behavior of unspecified kidney: Secondary | ICD-10-CM

## 2022-11-05 DIAGNOSIS — R55 Syncope and collapse: Secondary | ICD-10-CM | POA: Insufficient documentation

## 2022-11-05 LAB — GLUCOSE, CAPILLARY
Glucose-Capillary: 98 mg/dL (ref 70–99)
Glucose-Capillary: 103 mg/dL — ABNORMAL HIGH (ref 70–99)
Glucose-Capillary: 122 mg/dL — ABNORMAL HIGH (ref 70–99)
Glucose-Capillary: 129 mg/dL — ABNORMAL HIGH (ref 70–99)

## 2022-11-05 MED ORDER — MAGNESIUM OXIDE -MG SUPPLEMENT 400 (240 MG) MG PO TABS
400.0000 mg | ORAL_TABLET | Freq: Two times a day (BID) | ORAL | Status: AC
  Administered 2022-11-05 (×2): 400 mg via ORAL
  Filled 2022-11-05 (×2): qty 1

## 2022-11-05 NOTE — Progress Notes (Signed)
[facility] PROGRESS NOTE    Progress Note  [name]  JXB:[id number] DOB: Sep 30, 1960 DOA: 11/03/2022 PCP: Corwin Levins, MD     Brief Narrative:   [name] is an 62 y.o. male past medical history of renal cell carcinoma status post resection, chronic kidney disease stage IIIb with a baseline around 1.6-1.7, diabetes mellitus type 2, essential hypertension senting to the ED from his PCP office due to acute kidney injury and poor appetite that started after starting Farxiga, in the setting of ACE inhibitor and metformin use.   Assessment/Plan:   Acute renal failure superimposed on CKD (chronic kidney disease) stage 3, GFR 30-59 ml/min : With a baseline creatinine around 1.5-1.7 at the PCPs office 3.7. Suspect ATN , renal ultrasound showed no obstruction. Started on IV fluids, metformin and ACE inhibitor were held. Creatinine slowly improving now hypokalemic replete orally. Creatinine this morning is 2.9.  Has good urine output.  Hypokalemia: Repleted slowly improving. Recheck in the morning. Being repleted orally.  Near syncope: Likely due to above and improved.  Hypomagnesemia: Replete orally, recheck in the morning.  UTI has been ruled out  antibiotics discontinued.  Diabetes mellitus type 2, Holding oral hypoglycemic agents continue sliding scale insulin.  Essential hypertension: Holding ACE inhibitor, also hold hydralazine allow permissive hypertension. Will need to go home off ACE inhibitor  Alcohol use: Continue to monitor.    DVT prophylaxis: lovenox Family Communication:none Status is: Inpatient Remains inpatient appropriate because: ATN    Code Status:     Code Status Orders  (From admission, onward)           Start     Ordered   11/03/22 2113  Full code  Continuous       Question:  By:  Answer:  Consent: discussion documented in EHR   11/03/22 2114           Code Status History     Date Active Date Inactive  Code Status Order ID Comments User Context   08/23/2011 1648 08/25/2011 1615 Full Code [id number]  Laureen Ochs, RN Inpatient         IV Access:   Peripheral IV   Procedures and diagnostic studies:   MR ABDOMEN W WO CONTRAST  Result Date: 11/04/2022 CLINICAL DATA:  Renal cell carcinoma, status post bilateral ablation EXAM: MRI ABDOMEN WITHOUT AND WITH CONTRAST TECHNIQUE: Multiplanar multisequence MR imaging of the abdomen was performed both before and after the administration of intravenous contrast. CONTRAST:  7.52mL GADAVIST GADOBUTROL 1 MMOL/ML IV SOLN COMPARISON:  Unenhanced CT abdomen/pelvis dated 11/03/2022. MRI abdomen dated 06/14/2022. FINDINGS: Lower chest: Mild left lower lobe atelectasis. Hepatobiliary: 10 mm cyst in segment 2 (series 4/image 5). No suspicious/enhancing hepatic lesions. Gallbladder is unremarkable. No intrahepatic or extrahepatic duct dilatation. Pancreas:  Within normal limits. Spleen:  Within normal limits. Adrenals/Urinary Tract:  Adrenal glands are within normal limits. Status post lateral right lower pole renal cryoablation. Mild intrinsic T1 hyperintensity suggest coagulative necrosis (series 9/image 48). No enhancement to suggest viable tumor. Status post posterior left upper pole renal micro ablation. No residual enhancement to suggest viable tumor. Small bilateral renal cysts, including a dominant 10 mm simple left upper pole renal cyst (series 4/image 9), benign (Bosniak I). No follow-up is recommended. No hydronephrosis. Stomach/Bowel: Stomach is within normal limits. Visualized bowel is unremarkable. Vascular/Lymphatic:  No evidence of abdominal aortic aneurysm. No suspicious abdominal lymphadenopathy. Other:  No abdominal ascites. Musculoskeletal: No focal osseous lesions. IMPRESSION: Status post bilateral renal  ablation, as above. No findings suspicious for residual/recurrent tumor. No evidence of metastatic disease in the abdomen. Electronically Signed    By: Charline Bills M.D.   On: 11/04/2022 21:01   US RENAL  Result Date: 11/04/2022 CLINICAL DATA:  Acute kidney injury EXAM: RENAL / URINARY TRACT ULTRASOUND COMPLETE COMPARISON:  Renal stone protocol CT 11/03/2022 FINDINGS: Right Kidney: Renal measurements: 10.1 x 5.1 x 6.3 cm = volume: 170 mL. Mild increased echogenicity of the renal cortex without significant thinning. No hydronephrosis. 9 mm simple cyst does not require dedicated imaging surveillance. Left Kidney: Renal measurements: 11.0 x 7.3 x 6.2 cm = volume: 261 mL. Mild increased echogenicity the renal cortex without significant thinning. No mass or hydronephrosis visualized. Bladder: Mild diffuse bladder wall thickening may be due to chronic outlet obstruction or cystitis. It is not significantly changed since the CT examination. Other: None. IMPRESSION: Mild increased echogenicity of the renal cortices without significant thinning consistent with medical renal disease. Electronically Signed   By: Acquanetta Belling M.D.   On: 11/04/2022 08:45   CT Renal Stone Study  Result Date: 11/03/2022 CLINICAL DATA:  Bilateral flank pain found to have acute renal injury EXAM: CT ABDOMEN AND PELVIS WITHOUT CONTRAST TECHNIQUE: Multidetector CT imaging of the abdomen and pelvis was performed following the standard protocol without IV contrast. RADIATION DOSE REDUCTION: This exam was performed according to the departmental dose-optimization program which includes automated exposure control, adjustment of the mA and/or kV according to patient size and/or use of iterative reconstruction technique. COMPARISON:  MR abdomen dated 06/14/2022 FINDINGS: Lower chest: No focal consolidation or pulmonary nodule in the lung bases. No pleural effusion or pneumothorax demonstrated. Partially imaged heart size is normal. Hepatobiliary: Scattered hepatic cysts. No intra or extrahepatic biliary ductal dilation. Normal gallbladder. Pancreas: No focal lesions or main ductal dilation.  Spleen: Normal in size without focal abnormality. Adrenals/Urinary Tract: No adrenal nodules. Bilateral renal ablative changes. No suspicious renal mass, calculi or hydronephrosis. Diffuse mural thickening of the urinary bladder. Stomach/Bowel: Normal appearance of the stomach. No evidence of bowel wall thickening, distention, or inflammatory changes. Colonic diverticulosis without acute diverticulitis. Normal appendix. Vascular/Lymphatic: Aortic atherosclerosis. No enlarged abdominal or pelvic lymph nodes. Reproductive: Enlargement of the prostate with median lobe hypertrophy. Other: No free fluid, fluid collection, or free air. Musculoskeletal: No acute or abnormal lytic or blastic osseous lesions. IMPRESSION: 1. Diffuse mural thickening of the urinary bladder, which may be due to chronic outlet obstruction secondary to prostate enlargement. 2. Bilateral renal ablative changes. No suspicious renal mass by noncontrast technique, calculi, or hydronephrosis. 3.  Aortic Atherosclerosis (ICD10-I70.0). Electronically Signed   By: Agustin Cree M.D.   On: 11/03/2022 19:49   CT HEAD WO CONTRAST ( )  Result Date: 11/03/2022 CLINICAL DATA:  Syncope EXAM: CT HEAD WITHOUT CONTRAST CT CERVICAL SPINE WITHOUT CONTRAST TECHNIQUE: Multidetector CT imaging of the head and cervical spine was performed following the standard protocol without intravenous contrast. Multiplanar CT image reconstructions of the cervical spine were also generated. RADIATION DOSE REDUCTION: This exam was performed according to the departmental dose-optimization program which includes automated exposure control, adjustment of the mA and/or kV according to patient size and/or use of iterative reconstruction technique. COMPARISON:  None Available. FINDINGS: CT HEAD FINDINGS Brain: There is no mass, hemorrhage or extra-axial collection. The size and configuration of the ventricles and extra-axial CSF spaces are normal. The brain parenchyma is normal, without  evidence of acute or chronic infarction. Vascular: Atherosclerotic calcification of the internal carotid  arteries at the skull base. No abnormal hyperdensity of the major intracranial arteries or dural venous sinuses. Skull: The visualized skull base, calvarium and extracranial soft tissues are normal. Sinuses/Orbits: No fluid levels or advanced mucosal thickening of the visualized paranasal sinuses. No mastoid or middle ear effusion. The orbits are normal. CT CERVICAL SPINE FINDINGS Alignment: No static subluxation. Facets are aligned. Occipital condyles are normally positioned. Skull base and vertebrae: No acute fracture. Soft tissues and spinal canal: No prevertebral fluid or swelling. No visible canal hematoma. Disc levels: Multilevel facet arthrosis. No spinal canal stenosis or high-grade foraminal stenosis. Upper chest: No pneumothorax, pulmonary nodule or pleural effusion. Other: Normal visualized paraspinal cervical soft tissues. IMPRESSION: 1. No acute intracranial abnormality. 2. No acute fracture or static subluxation of the cervical spine. Electronically Signed   By: Deatra Robinson M.D.   On: 11/03/2022 19:31   CT Cervical Spine Wo Contrast  Result Date: 11/03/2022 CLINICAL DATA:  Syncope EXAM: CT HEAD WITHOUT CONTRAST CT CERVICAL SPINE WITHOUT CONTRAST TECHNIQUE: Multidetector CT imaging of the head and cervical spine was performed following the standard protocol without intravenous contrast. Multiplanar CT image reconstructions of the cervical spine were also generated. RADIATION DOSE REDUCTION: This exam was performed according to the departmental dose-optimization program which includes automated exposure control, adjustment of the mA and/or kV according to patient size and/or use of iterative reconstruction technique. COMPARISON:  None Available. FINDINGS: CT HEAD FINDINGS Brain: There is no mass, hemorrhage or extra-axial collection. The size and configuration of the ventricles and extra-axial  CSF spaces are normal. The brain parenchyma is normal, without evidence of acute or chronic infarction. Vascular: Atherosclerotic calcification of the internal carotid arteries at the skull base. No abnormal hyperdensity of the major intracranial arteries or dural venous sinuses. Skull: The visualized skull base, calvarium and extracranial soft tissues are normal. Sinuses/Orbits: No fluid levels or advanced mucosal thickening of the visualized paranasal sinuses. No mastoid or middle ear effusion. The orbits are normal. CT CERVICAL SPINE FINDINGS Alignment: No static subluxation. Facets are aligned. Occipital condyles are normally positioned. Skull base and vertebrae: No acute fracture. Soft tissues and spinal canal: No prevertebral fluid or swelling. No visible canal hematoma. Disc levels: Multilevel facet arthrosis. No spinal canal stenosis or high-grade foraminal stenosis. Upper chest: No pneumothorax, pulmonary nodule or pleural effusion. Other: Normal visualized paraspinal cervical soft tissues. IMPRESSION: 1. No acute intracranial abnormality. 2. No acute fracture or static subluxation of the cervical spine. Electronically Signed   By: Deatra Robinson M.D.   On: 11/03/2022 19:31     Medical Consultants:   None.   Subjective:    [name] was to go home  Objective:    Vitals:   11/04/22 0743 11/04/22 1121 11/04/22 2005 11/05/22 0504  BP: 112/76 108/70 126/88 118/78  Pulse: 72 63 63 62  Resp: 18 18 18 16   Temp: 98.4 F (36.9 C) 98.9 F (37.2 C) 98.4 F (36.9 C) 97.9 F (36.6 C)  TempSrc: Oral Oral Oral Oral  SpO2: 98% 100% 100% 99%  Weight:      Height:       SpO2: 99 %   Intake/Output Summary (Last 24 hours) at 11/05/2022 0815 Last data filed at 11/05/2022 0809 Gross per 24 hour  Intake 2798.65 ml  Output 3750 ml  Net -951.35 ml   Filed Weights   11/04/22 0018  Weight: 75.4 kg    Exam: General exam: In no acute distress. Respiratory system: Good air movement and  clear to auscultation. Cardiovascular system: S1 & S2 heard, RRR. No JVD. Gastrointestinal system: Abdomen is nondistended, soft and nontender.  Extremities: No pedal edema. Skin: No rashes, lesions or ulcers Psychiatry: Judgement and insight appear normal. Mood & affect appropriate.    Data Reviewed:    Labs: Basic Metabolic Panel: Recent Labs  Lab 11/03/22 1158 11/03/22 1835 11/03/22 2350 11/04/22 0514 11/05/22 0453  NA 141 136  --  138 137  K 3.3* 2.8*  --  2.6* 3.3*  CL 102 100  --  106 106  CO2 25 21*  --  22 22  GLUCOSE 86 111*  --  100* 108*  BUN 43* 45*  --  38* 28*  CREATININE 3.72* 3.59* 3.37* 3.02* 2.90*  CALCIUM 10.3 9.3  --  8.7* 8.6*  MG  --   --  1.6*  --  1.5*  PHOS  --   --   --   --  2.2*   GFR Estimated Creatinine Clearance: 27.8 mL/min (A) (by C-G formula based on SCr of 2.9 mg/dL (H)). Liver Function Tests: Recent Labs  Lab 11/03/22 1158 11/04/22 0514  AST 17 13*  ALT 13 12  ALKPHOS 68 55  BILITOT 0.6 0.6  PROT 8.1 6.3*  ALBUMIN 5.0 3.6   Recent Labs  Lab 11/03/22 1835  LIPASE 33   No results for input(s): "AMMONIA" in the last 168 hours. Coagulation profile No results for input(s): "INR", "PROTIME" in the last 168 hours. COVID-19 Labs  No results for input(s): "DDIMER", "FERRITIN", "LDH", "CRP" in the last 72 hours.  Lab Results  Component Value Date   SARSCOV2NAA NEGATIVE 05/13/2021   SARSCOV2NAA NEGATIVE 02/07/2021    CBC: Recent Labs  Lab 11/03/22 1158 11/03/22 1835 11/03/22 2350 11/04/22 0514  WBC 9.6 8.9 7.8 6.9  NEUTROABS 6.6 5.8  --   --   HGB 12.3* 11.7* 10.8* 10.3*  HCT 37.0* 33.8* 31.0* 29.8*  MCV 96.0 92.9 94.2 92.5  PLT 230.0 208 191 176   Cardiac Enzymes: No results for input(s): "CKTOTAL", "CKMB", "CKMBINDEX", "TROPONINI" in the last 168 hours. BNP (last 3 results) No results for input(s): "PROBNP" in the last 8760 hours. CBG: Recent Labs  Lab 11/04/22 0740 11/04/22 1125 11/04/22 1717  11/04/22 2120 11/05/22 0738  GLUCAP 108* 105* 97 114* 98   D-Dimer: No results for input(s): "DDIMER" in the last 72 hours. Hgb A1c: Recent Labs    11/03/22 1158  HGBA1C 6.6*   Lipid Profile: Recent Labs    11/03/22 1158  CHOL 119  HDL 61.70  LDLCALC 35  TRIG 114.0  CHOLHDL 2   Thyroid function studies: Recent Labs    11/03/22 2350  TSH 0.604   Anemia work up: No results for input(s): "VITAMINB12", "FOLATE", "FERRITIN", "TIBC", "IRON", "RETICCTPCT" in the last 72 hours. Sepsis Labs: Recent Labs  Lab 11/03/22 1158 11/03/22 1835 11/03/22 2350 11/04/22 0514  WBC 9.6 8.9 7.8 6.9   Microbiology Recent Results (from the past 240 hour(s))  Urine Culture     Status: None   Collection Time: 11/03/22 11:58 AM   Specimen: Urine  Result Value Ref Range Status   Source: URINE  Final   Status: FINAL  Final   Result:   Final    Less than 10,000 CFU/mL of single Gram positive organism isolated. No further testing will be performed. If clinically indicated, recollection using a method to minimize contamination, with prompt transfer to Urine Culture Transport Tube, is recommended.  Medications:    enoxaparin (LOVENOX) injection  30 mg Subcutaneous Q24H   hydrALAZINE  50 mg Oral TID   insulin aspart  0-5 Units Subcutaneous QHS   insulin aspart  0-9 Units Subcutaneous TID WC   nebivolol  20 mg Oral Daily   nicotine  14 mg Transdermal Daily   pantoprazole  40 mg Oral Daily   potassium chloride  40 mEq Oral TID   rosuvastatin  40 mg Oral Daily   sodium chloride flush  3 mL Intravenous Q12H   Continuous Infusions:  sodium chloride 100 mL/hr at 11/05/22 0341      LOS: 1 day   Marinda Elk  [facility]  11/05/2022, 8:15 AM

## 2022-11-05 NOTE — Progress Notes (Signed)
Mobility Specialist - Progress Note   11/05/22 1410  Mobility  Activity Ambulated independently in hallway  Level of Assistance Standby assist, set-up cues, supervision of patient - no hands on  Assistive Device Other (Comment) (IV Pole)  Distance Ambulated (ft) 450 ft  Range of Motion/Exercises Active  Activity Response Tolerated well  Mobility Referral Yes  $Mobility charge 1 Mobility  Mobility Specialist Start Time (ACUTE ONLY) 1400  Mobility Specialist Stop Time (ACUTE ONLY) 1410  Mobility Specialist Time Calculation (min) (ACUTE ONLY) 10 min   Pt was found in bed and agreeable to ambulate. No complaints with session. Had x1 LOB once returning to room that he was able to self correct. Was left on recliner chair with all needs met. Call bell in reach and family in room.  Billey Chang Mobility Specialist

## 2022-11-05 NOTE — Progress Notes (Signed)
   11/05/22 1948  BiPAP/CPAP/SIPAP  BiPAP/CPAP/SIPAP Pt Type Adult  Reason BIPAP/CPAP not in use Non-compliant (pt refusing)

## 2022-11-05 NOTE — Plan of Care (Signed)
°  Problem: Education: Goal: Knowledge of General Education information will improve Description: Including pain rating scale, medication(s)/side effects and non-pharmacologic comfort measures Outcome: Not Progressing   Problem: Health Behavior/Discharge Planning: Goal: Ability to manage health-related needs will improve Outcome: Not Progressing   Problem: Clinical Measurements: Goal: Ability to maintain clinical measurements within normal limits will improve Outcome: Not Progressing Goal: Diagnostic test results will improve Outcome: Not Progressing

## 2022-11-06 DIAGNOSIS — N171 Acute kidney failure with acute cortical necrosis: Secondary | ICD-10-CM | POA: Diagnosis not present

## 2022-11-06 DIAGNOSIS — E876 Hypokalemia: Secondary | ICD-10-CM | POA: Diagnosis not present

## 2022-11-06 DIAGNOSIS — N1831 Chronic kidney disease, stage 3a: Secondary | ICD-10-CM

## 2022-11-06 LAB — GLUCOSE, CAPILLARY
Glucose-Capillary: 118 mg/dL — ABNORMAL HIGH (ref 70–99)
Glucose-Capillary: 100 mg/dL — ABNORMAL HIGH (ref 70–99)
Glucose-Capillary: 117 mg/dL — ABNORMAL HIGH (ref 70–99)
Glucose-Capillary: 106 mg/dL — ABNORMAL HIGH (ref 70–99)

## 2022-11-06 MED ORDER — MAGNESIUM SULFATE IN D5W 1-5 GM/100ML-% IV SOLN
1.0000 g | Freq: Once | INTRAVENOUS | Status: AC
  Administered 2022-11-06: 1 g via INTRAVENOUS
  Filled 2022-11-06: qty 100

## 2022-11-06 MED ORDER — MAGNESIUM OXIDE -MG SUPPLEMENT 400 (240 MG) MG PO TABS
400.0000 mg | ORAL_TABLET | Freq: Two times a day (BID) | ORAL | Status: AC
  Administered 2022-11-06 (×2): 400 mg via ORAL
  Filled 2022-11-06 (×2): qty 1

## 2022-11-06 MED ORDER — POTASSIUM CHLORIDE CRYS ER 20 MEQ PO TBCR
40.0000 meq | EXTENDED_RELEASE_TABLET | Freq: Three times a day (TID) | ORAL | Status: AC
  Administered 2022-11-06 – 2022-11-07 (×4): 40 meq via ORAL
  Filled 2022-11-06 (×4): qty 2

## 2022-11-06 MED ORDER — POTASSIUM CHLORIDE 10 MEQ/100ML IV SOLN
10.0000 meq | INTRAVENOUS | Status: DC
Start: 2022-11-06 — End: 2022-11-06

## 2022-11-06 MED ORDER — POTASSIUM CHLORIDE 10 MEQ/100ML IV SOLN
10.0000 meq | INTRAVENOUS | Status: AC
  Administered 2022-11-06 (×3): 10 meq via INTRAVENOUS
  Filled 2022-11-06 (×3): qty 100

## 2022-11-06 MED ORDER — ENOXAPARIN SODIUM 40 MG/0.4ML IJ SOSY
40.0000 mg | PREFILLED_SYRINGE | INTRAMUSCULAR | Status: DC
  Administered 2022-11-07: 40 mg via SUBCUTANEOUS
  Filled 2022-11-06: qty 0.4

## 2022-11-06 NOTE — Plan of Care (Signed)
  Problem: Education: Goal: Knowledge of General Education information will improve Description: Including pain rating scale, medication(s)/side effects and non-pharmacologic comfort measures Outcome: Progressing   Problem: Clinical Measurements: Goal: Ability to maintain clinical measurements within normal limits will improve Outcome: Progressing Goal: Will remain free from infection Outcome: Progressing   

## 2022-11-06 NOTE — TOC Initial Note (Signed)
Transition of Care [facility]) - Initial/Assessment Note    Patient Details  Name: [name] MRN: [id number] Date of Birth: April 08, 1960  [facility]) CM/SW Contact:    Lanier Clam, RN Phone Number: 11/06/2022, 3:36 PM  Clinical Narrative:  Follow for d/c plans.                 Expected Discharge Plan: Home/Self Care Barriers to Discharge: Continued Medical Work up   Patient Goals and CMS Choice Patient states their goals for this hospitalization and ongoing recovery are:: Home CMS Medicare.gov Compare Post Acute Care list provided to:: Patient Choice offered to / list presented to : Patient      Expected Discharge Plan and Services       Living arrangements for the past 2 months: Single Family Home                                      Prior Living Arrangements/Services Living arrangements for the past 2 months: Single Family Home Lives with:: Self                   Activities of Daily Living Home Assistive Devices/Equipment: None ADL Screening (condition at time of admission) Patient's cognitive ability adequate to safely complete daily activities?: Yes Is the patient deaf or have difficulty hearing?: No Does the patient have difficulty seeing, even when wearing glasses/contacts?: No Does the patient have difficulty concentrating, remembering, or making decisions?: No Patient able to express need for assistance with ADLs?: Yes Does the patient have difficulty dressing or bathing?: No Independently performs ADLs?: Yes (appropriate for developmental age) Does the patient have difficulty walking or climbing stairs?: No Weakness of Legs: None Weakness of Arms/Hands: None  Permission Sought/Granted                  Emotional Assessment              Admission diagnosis:  Hypokalemia [E87.6] Acute kidney injury superimposed on chronic kidney disease (HCC) [N17.9, N18.9] Acute renal failure superimposed on chronic kidney  disease, unspecified acute renal failure type, unspecified CKD stage (HCC) [N17.9, N18.9] Acute kidney injury (AKI) with acute tubular necrosis (ATN) (HCC) [N17.0] Patient Active Problem List   Diagnosis Date Noted   Near syncope 11/05/2022   Hypomagnesemia 11/05/2022   Acute kidney injury (AKI) with acute tubular necrosis (ATN) (HCC) 11/04/2022   Nocturia 11/03/2022   Intractable acute post-traumatic headache 11/03/2022   Acute renal failure superimposed on chronic kidney disease (HCC) 11/03/2022   CKD (chronic kidney disease) stage 3, GFR 30-59 ml/min (HCC) 09/19/2022   B12 deficiency 09/19/2022   Hypokalemia 09/19/2022   Screen for colon cancer 03/21/2022   History of colon polyps 03/21/2022   Right renal mass 11/06/2021   Renal cancer, right (HCC) 11/06/2021   Hyperthyroidism 06/19/2021   Left renal mass 05/15/2021   Encounter for ablation of neoplasm of liver 05/15/2021   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   AKI (acute kidney injury) (HCC) 02/06/2021   Hallux limitus of left foot 07/17/2020   Capsulitis 09/13/2019   Acute gouty arthritis 08/02/2019   Dysphagia 12/20/2018   Bursitis of right elbow 09/14/2018   Renal insufficiency 12/10/2017   Encounter for well adult exam with abnormal findings 12/10/2017   Multinodular goiter 12/06/2015   Loss of weight 11/21/2014   Tobacco abuse 08/16/2012   Anal fissure  06/19/2011   Hemorrhoids 06/12/2011   GERD (gastroesophageal reflux disease) 08/12/2010   Diabetes (HCC) 08/29/2009   Hyperlipidemia 08/29/2009   SLEEP APNEA, OBSTRUCTIVE 01/14/2008   Essential hypertension 01/14/2008   PCP:  Corwin Levins, MD Pharmacy:   [phone], [place] - [address] [place] [address] Phone: [phone] Fax: [phone]     Social Determinants of Health (SDOH) Social History: SDOH Screenings   Food Insecurity: No Food Insecurity (11/04/2022)  Housing: Low Risk   (11/04/2022)  Transportation Needs: No Transportation Needs (11/04/2022)  Utilities: Not At Risk (11/04/2022)  Depression (PHQ2-9): High Risk (11/03/2022)  Tobacco Use: High Risk (11/03/2022)   SDOH Interventions:     Readmission Risk Interventions     No data to display

## 2022-11-06 NOTE — Progress Notes (Signed)
[facility] PROGRESS NOTE    Progress Note  Hammond Burningham  KGM:[id number] DOB: 1960/05/04 DOA: 11/03/2022 PCP: Corwin Levins, MD     Brief Narrative:   [name] is an 62 y.o. male past medical history of renal cell carcinoma status post resection, chronic kidney disease stage IIIb with a baseline around 1.6-1.7, diabetes mellitus type 2, essential hypertension senting to the ED from his PCP office due to acute kidney injury and poor appetite that started after starting Farxiga, in the setting of ACE inhibitor and metformin use.   Assessment/Plan:   Acute renal failure superimposed on CKD (chronic kidney disease) stage 3, GFR 30-59 ml/min : With a baseline creatinine around 1.5-1.7 at the PCPs office 3.7. Suspect ATN , renal ultrasound showed no obstruction. Started on IV fluids, metformin and ACE inhibitor were held. Creatinine slowly improving now hypokalemic replete orally. Having good urine output creatinine this morning is 2.3.  Hypokalemia: Lower today replete IV and orally recheck in the morning.  Near syncope: Likely due to above and improved.  Hypomagnesemia: Low today replete orally and IV. Recheck tomorrow morning  UTI has been ruled out  antibiotics discontinued.  Diabetes mellitus type 2, Holding oral hypoglycemic agents continue sliding scale insulin.  Essential hypertension: Holding ACE inhibitor, also hold hydralazine allow permissive hypertension. Will need to go home off ACE inhibitor  Alcohol use: Continue to monitor.    DVT prophylaxis: lovenox Family Communication:none Status is: Inpatient Remains inpatient appropriate because: ATN    Code Status:     Code Status Orders  (From admission, onward)           Start     Ordered   11/03/22 2113  Full code  Continuous       Question:  By:  Answer:  Consent: discussion documented in EHR   11/03/22 2114           Code Status History     Date Active Date Inactive  Code Status Order ID Comments User Context   08/23/2011 1648 08/25/2011 1615 Full Code [id number]  Laureen Ochs, RN Inpatient         IV Access:   Peripheral IV   Procedures and diagnostic studies:   MR ABDOMEN W WO CONTRAST  Result Date: 11/04/2022 CLINICAL DATA:  Renal cell carcinoma, status post bilateral ablation EXAM: MRI ABDOMEN WITHOUT AND WITH CONTRAST TECHNIQUE: Multiplanar multisequence MR imaging of the abdomen was performed both before and after the administration of intravenous contrast. CONTRAST:  7.5mL GADAVIST GADOBUTROL 1 MMOL/ML IV SOLN COMPARISON:  Unenhanced CT abdomen/pelvis dated 11/03/2022. MRI abdomen dated 06/14/2022. FINDINGS: Lower chest: Mild left lower lobe atelectasis. Hepatobiliary: 10 mm cyst in segment 2 (series 4/image 5). No suspicious/enhancing hepatic lesions. Gallbladder is unremarkable. No intrahepatic or extrahepatic duct dilatation. Pancreas:  Within normal limits. Spleen:  Within normal limits. Adrenals/Urinary Tract:  Adrenal glands are within normal limits. Status post lateral right lower pole renal cryoablation. Mild intrinsic T1 hyperintensity suggest coagulative necrosis (series 9/image 48). No enhancement to suggest viable tumor. Status post posterior left upper pole renal micro ablation. No residual enhancement to suggest viable tumor. Small bilateral renal cysts, including a dominant 10 mm simple left upper pole renal cyst (series 4/image 9), benign (Bosniak I). No follow-up is recommended. No hydronephrosis. Stomach/Bowel: Stomach is within normal limits. Visualized bowel is unremarkable. Vascular/Lymphatic:  No evidence of abdominal aortic aneurysm. No suspicious abdominal lymphadenopathy. Other:  No abdominal ascites. Musculoskeletal: No focal osseous lesions. IMPRESSION: Status post  bilateral renal ablation, as above. No findings suspicious for residual/recurrent tumor. No evidence of metastatic disease in the abdomen. Electronically Signed    By: Charline Bills M.D.   On: 11/04/2022 21:01   US RENAL  Result Date: 11/04/2022 CLINICAL DATA:  Acute kidney injury EXAM: RENAL / URINARY TRACT ULTRASOUND COMPLETE COMPARISON:  Renal stone protocol CT 11/03/2022 FINDINGS: Right Kidney: Renal measurements: 10.1 x 5.1 x 6.3 cm = volume: 170 mL. Mild increased echogenicity of the renal cortex without significant thinning. No hydronephrosis. 9 mm simple cyst does not require dedicated imaging surveillance. Left Kidney: Renal measurements: 11.0 x 7.3 x 6.2 cm = volume: 261 mL. Mild increased echogenicity the renal cortex without significant thinning. No mass or hydronephrosis visualized. Bladder: Mild diffuse bladder wall thickening may be due to chronic outlet obstruction or cystitis. It is not significantly changed since the CT examination. Other: None. IMPRESSION: Mild increased echogenicity of the renal cortices without significant thinning consistent with medical renal disease. Electronically Signed   By: Acquanetta Belling M.D.   On: 11/04/2022 08:45     Medical Consultants:   None.   Subjective:    Keatyn Irion no complaints but wants to go home.  Objective:    Vitals:   11/05/22 1144 11/05/22 1449 11/05/22 2136 11/06/22 0443  BP: 113/75 123/80 111/83 118/78  Pulse: 67 (!) 55 62 61  Resp: 18  16 20   Temp: 99.3 F (37.4 C) 99 F (37.2 C) 98.4 F (36.9 C) 98.2 F (36.8 C)  TempSrc: Oral Oral Oral Oral  SpO2: 98% 100% 99% 98%  Weight:      Height:       SpO2: 98 %   Intake/Output Summary (Last 24 hours) at 11/06/2022 0812 Last data filed at 11/06/2022 0981 Gross per 24 hour  Intake 3010.4 ml  Output 2050 ml  Net 960.4 ml   Filed Weights   11/04/22 0018  Weight: 75.4 kg    Exam: General exam: In no acute distress. Respiratory system: Good air movement and clear to auscultation. Cardiovascular system: S1 & S2 heard, RRR. No JVD. Gastrointestinal system: Abdomen is nondistended, soft and nontender.  Extremities: No  pedal edema. Skin: No rashes, lesions or ulcers Psychiatry: Judgement and insight appear normal. Mood & affect appropriate.  Data Reviewed:    Labs: Basic Metabolic Panel: Recent Labs  Lab 11/03/22 1158 11/03/22 1835 11/03/22 2350 11/04/22 0514 11/05/22 0453 11/06/22 0552  NA 141 136  --  138 137 138  K 3.3* 2.8*  --  2.6* 3.3* 3.0*  CL 102 100  --  106 106 108  CO2 25 21*  --  22 22 20*  GLUCOSE 86 111*  --  100* 108* 97  BUN 43* 45*  --  38* 28* 19  CREATININE 3.72* 3.59* 3.37* 3.02* 2.90* 2.32*  CALCIUM 10.3 9.3  --  8.7* 8.6* 8.5*  MG  --   --  1.6*  --  1.5* 1.5*  PHOS  --   --   --   --  2.2*  --    GFR Estimated Creatinine Clearance: 34.8 mL/min (A) (by C-G formula based on SCr of 2.32 mg/dL (H)). Liver Function Tests: Recent Labs  Lab 11/03/22 1158 11/04/22 0514  AST 17 13*  ALT 13 12  ALKPHOS 68 55  BILITOT 0.6 0.6  PROT 8.1 6.3*  ALBUMIN 5.0 3.6   Recent Labs  Lab 11/03/22 1835  LIPASE 33   No results for input(s): "AMMONIA" in  the last 168 hours. Coagulation profile No results for input(s): "INR", "PROTIME" in the last 168 hours. COVID-19 Labs  No results for input(s): "DDIMER", "FERRITIN", "LDH", "CRP" in the last 72 hours.  Lab Results  Component Value Date   SARSCOV2NAA NEGATIVE 05/13/2021   SARSCOV2NAA NEGATIVE 02/07/2021    CBC: Recent Labs  Lab 11/03/22 1158 11/03/22 1835 11/03/22 2350 11/04/22 0514  WBC 9.6 8.9 7.8 6.9  NEUTROABS 6.6 5.8  --   --   HGB 12.3* 11.7* 10.8* 10.3*  HCT 37.0* 33.8* 31.0* 29.8*  MCV 96.0 92.9 94.2 92.5  PLT 230.0 208 191 176   Cardiac Enzymes: No results for input(s): "CKTOTAL", "CKMB", "CKMBINDEX", "TROPONINI" in the last 168 hours. BNP (last 3 results) No results for input(s): "PROBNP" in the last 8760 hours. CBG: Recent Labs  Lab 11/05/22 0738 11/05/22 1141 11/05/22 1751 11/05/22 2133 11/06/22 0745  GLUCAP 98 103* 122* 129* 118*   D-Dimer: No results for input(s): "DDIMER" in the  last 72 hours. Hgb A1c: Recent Labs    11/03/22 1158  HGBA1C 6.6*   Lipid Profile: Recent Labs    11/03/22 1158  CHOL 119  HDL 61.70  LDLCALC 35  TRIG 114.0  CHOLHDL 2   Thyroid function studies: Recent Labs    11/03/22 2350  TSH 0.604   Anemia work up: No results for input(s): "VITAMINB12", "FOLATE", "FERRITIN", "TIBC", "IRON", "RETICCTPCT" in the last 72 hours. Sepsis Labs: Recent Labs  Lab 11/03/22 1158 11/03/22 1835 11/03/22 2350 11/04/22 0514  WBC 9.6 8.9 7.8 6.9   Microbiology Recent Results (from the past 240 hour(s))  Urine Culture     Status: None   Collection Time: 11/03/22 11:58 AM   Specimen: Urine  Result Value Ref Range Status   Source: URINE  Final   Status: FINAL  Final   Result:   Final    Less than 10,000 CFU/mL of single Gram positive organism isolated. No further testing will be performed. If clinically indicated, recollection using a method to minimize contamination, with prompt transfer to Urine Culture Transport Tube, is recommended.     Medications:    enoxaparin (LOVENOX) injection  30 mg Subcutaneous Q24H   insulin aspart  0-5 Units Subcutaneous QHS   insulin aspart  0-9 Units Subcutaneous TID WC   magnesium oxide  400 mg Oral BID   nebivolol  20 mg Oral Daily   nicotine  14 mg Transdermal Daily   pantoprazole  40 mg Oral Daily   potassium chloride  40 mEq Oral TID   rosuvastatin  40 mg Oral Daily   sodium chloride flush  3 mL Intravenous Q12H   Continuous Infusions:  sodium chloride 100 mL/hr at 11/05/22 2304      LOS: 2 days   Marinda Elk  [facility]  11/06/2022, 8:12 AM

## 2022-11-06 NOTE — Plan of Care (Signed)

## 2022-11-07 DIAGNOSIS — I10 Essential (primary) hypertension: Secondary | ICD-10-CM

## 2022-11-07 DIAGNOSIS — N171 Acute kidney failure with acute cortical necrosis: Secondary | ICD-10-CM | POA: Diagnosis not present

## 2022-11-07 DIAGNOSIS — N1831 Chronic kidney disease, stage 3a: Secondary | ICD-10-CM | POA: Diagnosis not present

## 2022-11-07 DIAGNOSIS — N170 Acute kidney failure with tubular necrosis: Secondary | ICD-10-CM | POA: Diagnosis not present

## 2022-11-07 LAB — GLUCOSE, CAPILLARY: Glucose-Capillary: 116 mg/dL — ABNORMAL HIGH (ref 70–99)

## 2022-11-07 MED ORDER — AMLODIPINE BESY-BENAZEPRIL HCL 10-40 MG PO CAPS: ORAL_CAPSULE | ORAL | Status: DC

## 2022-11-07 MED ORDER — AMLODIPINE BESY-BENAZEPRIL HCL 10-40 MG PO CAPS: ORAL_CAPSULE | ORAL | 3 refills | Status: DC

## 2022-11-07 NOTE — Discharge Summary (Signed)
Physician Discharge Summary  [name] NIO:[id number] DOB: [birth year] DOA: 11/03/2022  PCP: Corwin Levins, MD  Admit date: 11/03/2022 Discharge date: 11/07/2022  Admitted From: Home Disposition:  Home  Recommendations for Outpatient Follow-up:  Follow up with PCP in 1-2 weeks Please obtain BMP/CBC in one week   Home Health:No Equipment/Devices:None  Discharge Condition:Stable CODE STATUS:Full Diet recommendation: Heart Healthy   Brief/Interim Summary: 62 y.o. male past medical history of renal cell carcinoma status post resection, chronic kidney disease stage IIIb with a baseline around 1.6-1.7, diabetes mellitus type 2, essential hypertension senting to the ED from his PCP office due to acute kidney injury and poor appetite that started after starting Farxiga, in the setting of ACE inhibitor and metformin use.   Discharge Diagnoses:  Principal Problem:   Acute renal failure superimposed on chronic kidney disease (HCC) Active Problems:   Essential hypertension   CKD (chronic kidney disease) stage 3, GFR 30-59 ml/min (HCC)   Hypokalemia   Acute kidney injury (AKI) with acute tubular necrosis (ATN) (HCC)   Near syncope   Hypomagnesemia  ATN on chronic kidney see stage III a: With a baseline creatinine 1.5-1.7 on admission 3.7. In the setting of ACE inhibitor and diuretic use these were held on admission. He was started on IV fluids his creatinine trended down slowly. I am afraid this is new baseline. He was treated conservatively with IV fluids his creatinine improved.  On the day of discharge is 2.1.  Hypokalemia: Likely due to decreased oral intake in the setting of alcohol abuse. It took about 3 to 4 days of oral and IV repletion for his potassium to improved.  Hypomagnesemia: Was also low likely due to alcohol it took about 2 to 3 days of IV and oral repletion for his magnesium to improve.  Near syncope: Likely due to above, he was monitored on telemetry with no  events.  UTI: Has been ruled out antibiotics have been discontinued.  Diabetes mellitus type 2: No change made to his medication.  Essential hypertension: High degree of permissive hypertension was allowed. His ACE inhibitor will be held for 2 weeks he will resume in 2 weeks as an outpatient.  Discharge Instructions  Discharge Instructions     Diet - low sodium heart healthy   Complete by: As directed    Increase activity slowly   Complete by: As directed       Allergies as of 11/07/2022       Reactions   Sulfa Antibiotics Hives, Swelling   Vicodin [hydrocodone-acetaminophen] Itching, Other (See Comments)   Patient states he can take Tylenol without difficulty.        Medication List     STOP taking these medications    levofloxacin 500 MG tablet Commonly known as: Levaquin       TAKE these medications    amLODipine-benazepril 10-40 MG capsule Commonly known as: LOTREL TAKE 1 CAPSULE BY MOUTH EVERY DAY What changed:  how much to take how to take this when to take this additional instructions   hydrALAZINE 50 MG tablet Commonly known as: APRESOLINE Take 1 tablet (50 mg total) by mouth 3 (three) times daily.   Januvia 100 MG tablet Generic drug: sitaGLIPtin Take 100 mg by mouth daily.   metFORMIN 500 MG 24 hr tablet Commonly known as: GLUCOPHAGE-XR TAKE 2 TABLETS BY MOUTH EVERY DAY WITH BREAKFAST What changed:  how much to take how to take this when to take this additional instructions   nebivolol 10  MG tablet Commonly known as: BYSTOLIC Take 2 tablets (20 mg total) by mouth daily.   omeprazole 40 MG capsule Commonly known as: PRILOSEC TAKE 1 CAPSULE BY MOUTH EVERY DAY What changed:  how much to take how to take this when to take this additional instructions   rosuvastatin 40 MG tablet Commonly known as: CRESTOR Take 1 tablet (40 mg total) by mouth daily.        Allergies  Allergen Reactions   Sulfa Antibiotics Hives and  Swelling   Vicodin [Hydrocodone-Acetaminophen] Itching and Other (See Comments)    Patient states he can take Tylenol without difficulty.    Consultations: None   Procedures/Studies: MR ABDOMEN W WO CONTRAST  Result Date: 11/04/2022 CLINICAL DATA:  Renal cell carcinoma, status post bilateral ablation EXAM: MRI ABDOMEN WITHOUT AND WITH CONTRAST TECHNIQUE: Multiplanar multisequence MR imaging of the abdomen was performed both before and after the administration of intravenous contrast. CONTRAST:  7.46mL GADAVIST GADOBUTROL 1 MMOL/ML IV SOLN COMPARISON:  Unenhanced CT abdomen/pelvis dated 11/03/2022. MRI abdomen dated 06/14/2022. FINDINGS: Lower chest: Mild left lower lobe atelectasis. Hepatobiliary: 10 mm cyst in segment 2 (series 4/image 5). No suspicious/enhancing hepatic lesions. Gallbladder is unremarkable. No intrahepatic or extrahepatic duct dilatation. Pancreas:  Within normal limits. Spleen:  Within normal limits. Adrenals/Urinary Tract:  Adrenal glands are within normal limits. Status post lateral right lower pole renal cryoablation. Mild intrinsic T1 hyperintensity suggest coagulative necrosis (series 9/image 48). No enhancement to suggest viable tumor. Status post posterior left upper pole renal micro ablation. No residual enhancement to suggest viable tumor. Small bilateral renal cysts, including a dominant 10 mm simple left upper pole renal cyst (series 4/image 9), benign (Bosniak I). No follow-up is recommended. No hydronephrosis. Stomach/Bowel: Stomach is within normal limits. Visualized bowel is unremarkable. Vascular/Lymphatic:  No evidence of abdominal aortic aneurysm. No suspicious abdominal lymphadenopathy. Other:  No abdominal ascites. Musculoskeletal: No focal osseous lesions. IMPRESSION: Status post bilateral renal ablation, as above. No findings suspicious for residual/recurrent tumor. No evidence of metastatic disease in the abdomen. Electronically Signed   By: Charline Bills M.D.    On: 11/04/2022 21:01   US RENAL  Result Date: 11/04/2022 CLINICAL DATA:  Acute kidney injury EXAM: RENAL / URINARY TRACT ULTRASOUND COMPLETE COMPARISON:  Renal stone protocol CT 11/03/2022 FINDINGS: Right Kidney: Renal measurements: 10.1 x 5.1 x 6.3 cm = volume: 170 mL. Mild increased echogenicity of the renal cortex without significant thinning. No hydronephrosis. 9 mm simple cyst does not require dedicated imaging surveillance. Left Kidney: Renal measurements: 11.0 x 7.3 x 6.2 cm = volume: 261 mL. Mild increased echogenicity the renal cortex without significant thinning. No mass or hydronephrosis visualized. Bladder: Mild diffuse bladder wall thickening may be due to chronic outlet obstruction or cystitis. It is not significantly changed since the CT examination. Other: None. IMPRESSION: Mild increased echogenicity of the renal cortices without significant thinning consistent with medical renal disease. Electronically Signed   By: Acquanetta Belling M.D.   On: 11/04/2022 08:45   CT Renal Stone Study  Result Date: 11/03/2022 CLINICAL DATA:  Bilateral flank pain found to have acute renal injury EXAM: CT ABDOMEN AND PELVIS WITHOUT CONTRAST TECHNIQUE: Multidetector CT imaging of the abdomen and pelvis was performed following the standard protocol without IV contrast. RADIATION DOSE REDUCTION: This exam was performed according to the departmental dose-optimization program which includes automated exposure control, adjustment of the mA and/or kV according to patient size and/or use of iterative reconstruction technique. COMPARISON:  MR abdomen  dated 06/14/2022 FINDINGS: Lower chest: No focal consolidation or pulmonary nodule in the lung bases. No pleural effusion or pneumothorax demonstrated. Partially imaged heart size is normal. Hepatobiliary: Scattered hepatic cysts. No intra or extrahepatic biliary ductal dilation. Normal gallbladder. Pancreas: No focal lesions or main ductal dilation. Spleen: Normal in size  without focal abnormality. Adrenals/Urinary Tract: No adrenal nodules. Bilateral renal ablative changes. No suspicious renal mass, calculi or hydronephrosis. Diffuse mural thickening of the urinary bladder. Stomach/Bowel: Normal appearance of the stomach. No evidence of bowel wall thickening, distention, or inflammatory changes. Colonic diverticulosis without acute diverticulitis. Normal appendix. Vascular/Lymphatic: Aortic atherosclerosis. No enlarged abdominal or pelvic lymph nodes. Reproductive: Enlargement of the prostate with median lobe hypertrophy. Other: No free fluid, fluid collection, or free air. Musculoskeletal: No acute or abnormal lytic or blastic osseous lesions. IMPRESSION: 1. Diffuse mural thickening of the urinary bladder, which may be due to chronic outlet obstruction secondary to prostate enlargement. 2. Bilateral renal ablative changes. No suspicious renal mass by noncontrast technique, calculi, or hydronephrosis. 3.  Aortic Atherosclerosis (ICD10-I70.0). Electronically Signed   By: Agustin Cree M.D.   On: 11/03/2022 19:49   CT HEAD WO CONTRAST ( )  Result Date: 11/03/2022 CLINICAL DATA:  Syncope EXAM: CT HEAD WITHOUT CONTRAST CT CERVICAL SPINE WITHOUT CONTRAST TECHNIQUE: Multidetector CT imaging of the head and cervical spine was performed following the standard protocol without intravenous contrast. Multiplanar CT image reconstructions of the cervical spine were also generated. RADIATION DOSE REDUCTION: This exam was performed according to the departmental dose-optimization program which includes automated exposure control, adjustment of the mA and/or kV according to patient size and/or use of iterative reconstruction technique. COMPARISON:  None Available. FINDINGS: CT HEAD FINDINGS Brain: There is no mass, hemorrhage or extra-axial collection. The size and configuration of the ventricles and extra-axial CSF spaces are normal. The brain parenchyma is normal, without evidence of acute or  chronic infarction. Vascular: Atherosclerotic calcification of the internal carotid arteries at the skull base. No abnormal hyperdensity of the major intracranial arteries or dural venous sinuses. Skull: The visualized skull base, calvarium and extracranial soft tissues are normal. Sinuses/Orbits: No fluid levels or advanced mucosal thickening of the visualized paranasal sinuses. No mastoid or middle ear effusion. The orbits are normal. CT CERVICAL SPINE FINDINGS Alignment: No static subluxation. Facets are aligned. Occipital condyles are normally positioned. Skull base and vertebrae: No acute fracture. Soft tissues and spinal canal: No prevertebral fluid or swelling. No visible canal hematoma. Disc levels: Multilevel facet arthrosis. No spinal canal stenosis or high-grade foraminal stenosis. Upper chest: No pneumothorax, pulmonary nodule or pleural effusion. Other: Normal visualized paraspinal cervical soft tissues. IMPRESSION: 1. No acute intracranial abnormality. 2. No acute fracture or static subluxation of the cervical spine. Electronically Signed   By: Deatra Robinson M.D.   On: 11/03/2022 19:31   CT Cervical Spine Wo Contrast  Result Date: 11/03/2022 CLINICAL DATA:  Syncope EXAM: CT HEAD WITHOUT CONTRAST CT CERVICAL SPINE WITHOUT CONTRAST TECHNIQUE: Multidetector CT imaging of the head and cervical spine was performed following the standard protocol without intravenous contrast. Multiplanar CT image reconstructions of the cervical spine were also generated. RADIATION DOSE REDUCTION: This exam was performed according to the departmental dose-optimization program which includes automated exposure control, adjustment of the mA and/or kV according to patient size and/or use of iterative reconstruction technique. COMPARISON:  None Available. FINDINGS: CT HEAD FINDINGS Brain: There is no mass, hemorrhage or extra-axial collection. The size and configuration of the ventricles and extra-axial CSF spaces  are normal.  The brain parenchyma is normal, without evidence of acute or chronic infarction. Vascular: Atherosclerotic calcification of the internal carotid arteries at the skull base. No abnormal hyperdensity of the major intracranial arteries or dural venous sinuses. Skull: The visualized skull base, calvarium and extracranial soft tissues are normal. Sinuses/Orbits: No fluid levels or advanced mucosal thickening of the visualized paranasal sinuses. No mastoid or middle ear effusion. The orbits are normal. CT CERVICAL SPINE FINDINGS Alignment: No static subluxation. Facets are aligned. Occipital condyles are normally positioned. Skull base and vertebrae: No acute fracture. Soft tissues and spinal canal: No prevertebral fluid or swelling. No visible canal hematoma. Disc levels: Multilevel facet arthrosis. No spinal canal stenosis or high-grade foraminal stenosis. Upper chest: No pneumothorax, pulmonary nodule or pleural effusion. Other: Normal visualized paraspinal cervical soft tissues. IMPRESSION: 1. No acute intracranial abnormality. 2. No acute fracture or static subluxation of the cervical spine. Electronically Signed   By: Deatra Robinson M.D.   On: 11/03/2022 19:31   (Echo, Carotid, EGD, Colonoscopy, ERCP)    Subjective: No complaints  Discharge Exam: Vitals:   11/06/22 2012 11/07/22 0452  BP: 118/79 129/87  Pulse: (!) 55 (!) 59  Resp: 12 18  Temp: 98.7 F (37.1 C) 98.4 F (36.9 C)  SpO2: 99% 98%   Vitals:   11/06/22 0443 11/06/22 1353 11/06/22 2012 11/07/22 0452  BP: 118/78 134/83 118/79 129/87  Pulse: 61 (!) 53 (!) 55 (!) 59  Resp: 20  12 18   Temp: 98.2 F (36.8 C) 98.2 F (36.8 C) 98.7 F (37.1 C) 98.4 F (36.9 C)  TempSrc: Oral Oral Oral Oral  SpO2: 98% 100% 99% 98%  Weight:      Height:        General: Pt is alert, awake, not in acute distress Cardiovascular: RRR, S1/S2 +, no rubs, no gallops Respiratory: CTA bilaterally, no wheezing, no rhonchi Abdominal: Soft, NT, ND, bowel  sounds + Extremities: no edema, no cyanosis    The results of significant diagnostics from this hospitalization (including imaging, microbiology, ancillary and laboratory) are listed below for reference.     Microbiology: Recent Results (from the past 240 hour(s))  Urine Culture     Status: None   Collection Time: 11/03/22 11:58 AM   Specimen: Urine  Result Value Ref Range Status   Source: URINE  Final   Status: FINAL  Final   Result:   Final    Less than 10,000 CFU/mL of single Gram positive organism isolated. No further testing will be performed. If clinically indicated, recollection using a method to minimize contamination, with prompt transfer to Urine Culture Transport Tube, is recommended.     Labs: BNP (last 3 results) No results for input(s): "BNP" in the last 8760 hours. Basic Metabolic Panel: Recent Labs  Lab 11/03/22 1835 11/03/22 2350 11/04/22 0514 11/05/22 0453 11/06/22 0552 11/07/22 0456  NA 136  --  138 137 138 135  K 2.8*  --  2.6* 3.3* 3.0* 3.6  CL 100  --  106 106 108 105  CO2 21*  --  22 22 20* 21*  GLUCOSE 111*  --  100* 108* 97 104*  BUN 45*  --  38* 28* 19 17  CREATININE 3.59* 3.37* 3.02* 2.90* 2.32* 2.25*  CALCIUM 9.3  --  8.7* 8.6* 8.5* 8.5*  MG  --  1.6*  --  1.5* 1.5*  --   PHOS  --   --   --  2.2*  --   --  Liver Function Tests: Recent Labs  Lab 11/03/22 1158 11/04/22 0514  AST 17 13*  ALT 13 12  ALKPHOS 68 55  BILITOT 0.6 0.6  PROT 8.1 6.3*  ALBUMIN 5.0 3.6   Recent Labs  Lab 11/03/22 1835  LIPASE 33   No results for input(s): "AMMONIA" in the last 168 hours. CBC: Recent Labs  Lab 11/03/22 1158 11/03/22 1835 11/03/22 2350 11/04/22 0514  WBC 9.6 8.9 7.8 6.9  NEUTROABS 6.6 5.8  --   --   HGB 12.3* 11.7* 10.8* 10.3*  HCT 37.0* 33.8* 31.0* 29.8*  MCV 96.0 92.9 94.2 92.5  PLT 230.0 208 191 176   Cardiac Enzymes: No results for input(s): "CKTOTAL", "CKMB", "CKMBINDEX", "TROPONINI" in the last 168 hours. BNP: Invalid  input(s): "POCBNP" CBG: Recent Labs  Lab 11/06/22 0745 11/06/22 1121 11/06/22 1615 11/06/22 2104 11/07/22 0740  GLUCAP 118* 100* 117* 106* 116*   D-Dimer No results for input(s): "DDIMER" in the last 72 hours. Hgb A1c No results for input(s): "HGBA1C" in the last 72 hours. Lipid Profile No results for input(s): "CHOL", "HDL", "LDLCALC", "TRIG", "CHOLHDL", "LDLDIRECT" in the last 72 hours. Thyroid function studies No results for input(s): "TSH", "T4TOTAL", "T3FREE", "THYROIDAB" in the last 72 hours.  Invalid input(s): "FREET3" Anemia work up No results for input(s): "VITAMINB12", "FOLATE", "FERRITIN", "TIBC", "IRON", "RETICCTPCT" in the last 72 hours. Urinalysis    Component Value Date/Time   COLORURINE YELLOW 11/03/2022 1848   APPEARANCEUR CLOUDY (A) 11/03/2022 1848   LABSPEC 1.014 11/03/2022 1848   PHURINE 5.0 11/03/2022 1848   GLUCOSEU >=500 (A) 11/03/2022 1848   GLUCOSEU 500 (A) 11/03/2022 1158   HGBUR MODERATE (A) 11/03/2022 1848   BILIRUBINUR NEGATIVE 11/03/2022 1848   KETONESUR NEGATIVE 11/03/2022 1848   PROTEINUR 100 (A) 11/03/2022 1848   UROBILINOGEN 0.2 11/03/2022 1158   NITRITE NEGATIVE 11/03/2022 1848   LEUKOCYTESUR NEGATIVE 11/03/2022 1848   Sepsis Labs Recent Labs  Lab 11/03/22 1158 11/03/22 1835 11/03/22 2350 11/04/22 0514  WBC 9.6 8.9 7.8 6.9   Microbiology Recent Results (from the past 240 hour(s))  Urine Culture     Status: None   Collection Time: 11/03/22 11:58 AM   Specimen: Urine  Result Value Ref Range Status   Source: URINE  Final   Status: FINAL  Final   Result:   Final    Less than 10,000 CFU/mL of single Gram positive organism isolated. No further testing will be performed. If clinically indicated, recollection using a method to minimize contamination, with prompt transfer to Urine Culture Transport Tube, is recommended.    SIGNED:   Marinda Elk, MD  [facility] 11/07/2022, 8:46 AM Pager   If 7PM-7AM, please  contact night-coverage [email or web address] Password [email or web address]

## 2022-11-10 ENCOUNTER — Telehealth: Payer: Self-pay

## 2022-11-10 NOTE — Transitions of Care (Post Inpatient/ED Visit) (Signed)
   11/10/2022  Name: [name] MRN: [id number] DOB: Nov 23, 1960  Today's TOC FU Call Status: Today's TOC FU Call Status:: Successful TOC FU Call Completed TOC FU Call Complete Date: 11/10/22  Transition Care Management Follow-up Telephone Call Date of Discharge: 11/07/22 Discharge Facility: [facility]) Type of Discharge: Inpatient Admission Primary Inpatient Discharge Diagnosis:: Acute renal failure superimposed on chronic kidney disease How have you been since you were released from the hospital?: Better Any questions or concerns?: No  Items Reviewed: Did you receive and understand the discharge instructions provided?: Yes Medications obtained,verified, and reconciled?: Yes (Medications Reviewed) Any new allergies since your discharge?: No Dietary orders reviewed?: Yes Do you have support at home?: Yes  Medications Reviewed Today: Medications Reviewed Today     Reviewed by Merleen Nicely, LPN (Licensed Practical Nurse) on 11/10/22 at 1029  Med List Status: <None>   Medication Order Taking? Sig Documenting Provider Last Dose Status Informant  amLODipine-benazepril (LOTREL) 10-40 MG capsule [id number] No TAKE 1 CAPSULE BY MOUTH EVERY DAY  Patient not taking: Reported on 11/10/2022   Marinda Elk, MD Not Taking Active   hydrALAZINE (APRESOLINE) 50 MG tablet [id number] Yes Take 1 tablet (50 mg total) by mouth 3 (three) times daily. Corwin Levins, MD Taking Active Multiple Informants  JANUVIA 100 MG tablet [id number] Yes Take 100 mg by mouth daily. [provider] Taking Active Multiple Informants  metFORMIN (GLUCOPHAGE-XR) 500 MG 24 hr tablet [id number] Yes TAKE 2 TABLETS BY MOUTH EVERY DAY WITH BREAKFAST  Patient taking differently: Take 1,000 mg by mouth daily with breakfast.   Corwin Levins, MD Taking Active Multiple Informants  nebivolol (BYSTOLIC) 10 MG tablet [id number] Yes Take 2 tablets (20 mg total) by mouth daily. Corwin Levins, MD Taking Active  Multiple Informants  omeprazole (PRILOSEC) 40 MG capsule [id number] Yes TAKE 1 CAPSULE BY MOUTH EVERY DAY  Patient taking differently: Take 40 mg by mouth daily before breakfast.   Corwin Levins, MD Taking Active Multiple Informants  rosuvastatin (CRESTOR) 40 MG tablet [id number] Yes Take 1 tablet (40 mg total) by mouth daily. Corwin Levins, MD Taking Active Multiple Informants            Home Care and Equipment/Supplies: Were Home Health Services Ordered?: No Any new equipment or medical supplies ordered?: No  Functional Questionnaire: Do you need assistance with bathing/showering or dressing?: No Do you need assistance with meal preparation?: No Do you need assistance with eating?: No Do you have difficulty maintaining continence: No Do you need assistance with getting out of bed/getting out of a chair/moving?: No Do you have difficulty managing or taking your medications?: No  Follow up appointments reviewed: PCP Follow-up appointment confirmed?: Yes Date of PCP follow-up appointment?: 11/14/22 Follow-up Provider: Dr Jonny Ruiz [facility] Follow-up appointment confirmed?: No Do you need transportation to your follow-up appointment?: No Do you understand care options if your condition(s) worsen?: Yes-patient verbalized understanding    SIGNATURE  Woodfin Ganja LPN [facility] Nurse Health Advisor Direct Dial [phone]

## 2022-11-12 ENCOUNTER — Ambulatory Visit: Payer: 59 | Admitting: Internal Medicine

## 2022-11-13 ENCOUNTER — Encounter (INDEPENDENT_AMBULATORY_CARE_PROVIDER_SITE_OTHER): Payer: Self-pay

## 2022-11-14 ENCOUNTER — Encounter: Payer: Self-pay | Admitting: Internal Medicine

## 2022-11-14 ENCOUNTER — Ambulatory Visit: Payer: 59 | Admitting: Internal Medicine

## 2022-11-14 VITALS — BP 120/66 | HR 73 | Temp 98.8°F | Ht 73.0 in | Wt 169.0 lb

## 2022-11-14 DIAGNOSIS — E782 Mixed hyperlipidemia: Secondary | ICD-10-CM

## 2022-11-14 DIAGNOSIS — N179 Acute kidney failure, unspecified: Secondary | ICD-10-CM | POA: Diagnosis not present

## 2022-11-14 DIAGNOSIS — E119 Type 2 diabetes mellitus without complications: Secondary | ICD-10-CM

## 2022-11-14 DIAGNOSIS — Z720 Tobacco use: Secondary | ICD-10-CM | POA: Diagnosis not present

## 2022-11-14 DIAGNOSIS — Z794 Long term (current) use of insulin: Secondary | ICD-10-CM

## 2022-11-14 DIAGNOSIS — Z7984 Long term (current) use of oral hypoglycemic drugs: Secondary | ICD-10-CM

## 2022-11-14 LAB — BASIC METABOLIC PANEL
BUN: 27 mg/dL — ABNORMAL HIGH (ref 6–23)
CO2: 25 mEq/L (ref 19–32)
Calcium: 9.6 mg/dL (ref 8.4–10.5)
Chloride: 105 mEq/L (ref 96–112)
Creatinine, Ser: 2.27 mg/dL — ABNORMAL HIGH (ref 0.40–1.50)
GFR: 29.95 mL/min — ABNORMAL LOW (ref >60.00)
Glucose, Bld: 90 mg/dL (ref 70–99)
Potassium: 3.4 mEq/L — ABNORMAL LOW (ref 3.5–5.1)
Sodium: 138 mEq/L (ref 135–145)

## 2022-11-14 NOTE — Patient Instructions (Signed)
Please continue all other medications as before, though we may have to call to let you know about not taking the meloxicam if the kidney function is worse again  Please have the pharmacy call with any other refills you may need.  Please continue your efforts at being more active, low cholesterol diet, and weight control.  Please keep your appointments with your specialists as you may have planned  Please go to the LAB at the blood drawing area for the tests to be done  You will be contacted by phone if any changes need to be made immediately.  Otherwise, you will receive a letter about your results with an explanation, but please check with MyChart first.

## 2022-11-14 NOTE — Progress Notes (Unsigned)
Patient ID: Liem Muraoka, male   DOB: Sep 07, 1960, 62 y.o.   MRN: [id number]        Chief Complaint: follow up recent hospn with AKI from 8/5 - 8/9       HPI:  Zeev Wiant is a 62 y.o. male here with past medical history of renal cell carcinoma status post resection, chronic kidney disease stage IIIb with a baseline around 1.6-1.7, diabetes mellitus type 2, essential hypertension senting to the ED from his PCP office due to acute kidney injury and poor appetite that started after starting Farxiga, in the setting of ACE inhibitor and metformin use .  Also hx of renal ca.  Tx with IVF and K replacement.  Initial cr 3.7, and last 2.25.  Did see ortho for left knee pain due to fall prior to admit on 8/14, and pt has been taking mobic 15 mg since then.  Pt still smoking, not ready to quit.  Transitional Care Management elements noted today: 1)  Date of D/C: as above 2)  Medication reconciliation:  done today at end visit 3)  Review of D/C summary or other information:  done today 4)  Review of need for f/u on pending diagnostic tests and treatments:  done today - none 5)  Review of need for Interaction with other providers who will assume or resume care of pt specific problems: done today - none 6)  Education of patient/family/guardian or caregiver: none needed  Wt Readings from Last 3 Encounters:  11/14/22 169 lb (76.7 kg)  11/04/22 166 lb 3.2 oz (75.4 kg)  11/03/22 166 lb 3.2 oz (75.4 kg)   BP Readings from Last 3 Encounters:  11/14/22 120/66  11/07/22 129/87  11/03/22 108/68         Past Medical History:  Diagnosis Date   Anginal pain (HCC)    Chest pain 02/13/2012   echo - EF >55%;mod concentric L ventricular hypertrophy; atrial septum aneurysmal; patent foramen ovale suspected on color flow, LA mil/mod dilation; RV systolic pressure   Chest pain 06/09/2007   echo - EF 45-50%; interarterial shunt; mild valvular regurgitation;    Chest pain 06/09/2007   Myoview - EF  53%; normal perfusion all regions;EKG negative for ischemia    Chronic kidney disease    Diabetes mellitus    type II   DIABETES MELLITUS, TYPE II 08/29/2009   GERD (gastroesophageal reflux disease) 08/12/2010   Hemorrhoids    History of anal fissures    Hyperlipidemia    HYPERLIPIDEMIA 08/29/2009   Hypertension    HYPERTENSION 01/14/2008   Lightheadedness 05/10/2002   30d monitor unremarkable   LVH (left ventricular hypertrophy)    with systolic dysfunction by echo 10/03, EF 45-50% - Dr. Elsie Lincoln   Multinodular goiter 12/06/2015   Nonspecific ST-T wave electrocardiographic changes 08/11/2006   Myoview - EF 49%; normal perfusion all regions, EKG negative for ischemia   OSA (obstructive sleep apnea)    Preventative health care 08/11/2010   Sleep apnea 08/13/2008   AHI 1.2; sleep study 10/2007 - AHI 9.47at 15cm H2O and 31.3 at 16cm H2O   SLEEP APNEA, OBSTRUCTIVE 01/14/2008   Past Surgical History:  Procedure Laterality Date   ANAL SPHINCTEROTOMY  08/20/11   CARDIAC CATHETERIZATION  10/23/2008   normal coronary arteries and LV function   COLONOSCOPY     IR RADIOLOGIST EVAL & MGMT  04/12/2021   IR RADIOLOGIST EVAL & MGMT  08/13/2021   IR RADIOLOGIST EVAL & MGMT  09/11/2021  IR RADIOLOGIST EVAL & MGMT  12/04/2021   IR RADIOLOGIST EVAL & MGMT  03/06/2022   POLYPECTOMY     RADIOLOGY WITH ANESTHESIA N/A 05/15/2021   Procedure: MICROWAVE ABLATION;  Surgeon: Roanna Banning, MD;  Location: [facility];  Service: Radiology;  Laterality: N/A;   RADIOLOGY WITH ANESTHESIA Right 11/06/2021   Procedure: IR WITH ANESTHESIA RIGHT RENAL CRYOABLATION;  Surgeon: Roanna Banning, MD;  Location: [facility];  Service: Radiology;  Laterality: Right;    reports that he has been smoking cigarettes. He has a 15 pack-year smoking history. He has never used smokeless tobacco. He reports current alcohol use of about 3.0 standard drinks of alcohol per week. He reports that he does not use drugs. family history includes Heart disease  in his father; Hypertension in his mother. Allergies  Allergen Reactions   Sulfa Antibiotics Hives and Swelling   Vicodin [Hydrocodone-Acetaminophen] Itching and Other (See Comments)    Patient states he can take Tylenol without difficulty.   Current Outpatient Medications on File Prior to Visit  Medication Sig Dispense Refill   hydrALAZINE (APRESOLINE) 50 MG tablet Take 1 tablet (50 mg total) by mouth 3 (three) times daily. 270 tablet 3   JANUVIA 100 MG tablet Take 100 mg by mouth daily.     meloxicam (MOBIC) 15 MG tablet Take 1 tablet every day by oral route with meal(s).     metFORMIN (GLUCOPHAGE-XR) 500 MG 24 hr tablet TAKE 2 TABLETS BY MOUTH EVERY DAY WITH BREAKFAST (Patient taking differently: Take 1,000 mg by mouth daily with breakfast.) 180 tablet 3   nebivolol (BYSTOLIC) 10 MG tablet Take 2 tablets (20 mg total) by mouth daily. 180 tablet 3   omeprazole (PRILOSEC) 40 MG capsule TAKE 1 CAPSULE BY MOUTH EVERY DAY (Patient taking differently: Take 40 mg by mouth daily before breakfast.) 90 capsule 3   rosuvastatin (CRESTOR) 40 MG tablet Take 1 tablet (40 mg total) by mouth daily. 90 tablet 3   amLODipine-benazepril (LOTREL) 10-40 MG capsule TAKE 1 CAPSULE BY MOUTH EVERY DAY (Patient not taking: Reported on 11/10/2022)     No current facility-administered medications on file prior to visit.        ROS:  All others reviewed and negative.  Objective        PE:  BP 120/66 (BP Location: Right Arm, Patient Position: Sitting, Cuff Size: Normal)   Pulse 73   Temp 98.8 F (37.1 C) (Oral)   Ht 6\' 1"  (1.854 m)   Wt 169 lb (76.7 kg)   SpO2 98%   BMI 22.30 kg/m                 Constitutional: Pt appears in NAD               HENT: Head: NCAT.                Right Ear: External ear normal.                 Left Ear: External ear normal.                Eyes: . Pupils are equal, round, and reactive to light. Conjunctivae and EOM are normal               Nose: without d/c or deformity                Neck: Neck supple. Gross normal ROM  Cardiovascular: Normal rate and regular rhythm.                 Pulmonary/Chest: Effort normal and breath sounds without rales or wheezing.                Abd:  Soft, NT, ND, + BS, no organomegaly               Neurological: Pt is alert. At baseline orientation, motor grossly intact               Skin: Skin is warm. No rashes, no other new lesions, LE edema - none               Psychiatric: Pt behavior is normal without agitation   Micro: none  Cardiac tracings I have personally interpreted today:  none  Pertinent Radiological findings (summarize): none   Lab Results  Component Value Date   WBC 6.9 11/04/2022   HGB 10.3 (L) 11/04/2022   HCT 29.8 (L) 11/04/2022   PLT 176 11/04/2022   GLUCOSE 90 11/14/2022   CHOL 119 11/03/2022   TRIG 114.0 11/03/2022   HDL 61.70 11/03/2022   LDLDIRECT 117.9 01/14/2008   LDLCALC 35 11/03/2022   ALT 12 11/04/2022   AST 13 (L) 11/04/2022   NA 138 11/14/2022   K 3.4 (L) 11/14/2022   CL 105 11/14/2022   CREATININE 2.27 (H) 11/14/2022   BUN 27 (H) 11/14/2022   CO2 25 11/14/2022   TSH 0.604 11/03/2022   PSA 0.66 09/17/2022   INR 1.0 10/29/2021   HGBA1C 6.6 (H) 11/03/2022   MICROALBUR 3.6 (H) 09/17/2022   Assessment/Plan:  Yosuke Gittens is a 62 y.o. Black or African American [2] male with  has a past medical history of Anginal pain (HCC), Chest pain (02/13/2012), Chest pain (06/09/2007), Chest pain (06/09/2007), Chronic kidney disease, Diabetes mellitus, DIABETES MELLITUS, TYPE II (08/29/2009), GERD (gastroesophageal reflux disease) (08/12/2010), Hemorrhoids, History of anal fissures, Hyperlipidemia, HYPERLIPIDEMIA (08/29/2009), Hypertension, HYPERTENSION (01/14/2008), Lightheadedness (05/10/2002), LVH (left ventricular hypertrophy), Multinodular goiter (12/06/2015), Nonspecific ST-T wave electrocardiographic changes (08/11/2006), OSA (obstructive sleep apnea), Preventative health care  (08/11/2010), Sleep apnea (08/13/2008), and SLEEP APNEA, OBSTRUCTIVE (01/14/2008).  AKI (acute kidney injury) (HCC) Improved at d/c, for f/u lab today; it is concerning he was just place on mobic post hospn for AKI per ortho, will need to follow this closely  Diabetes [facility]) Lab Results  Component Value Date   HGBA1C 6.6 (H) 11/03/2022   Stable, pt to continue current medical treatment januvia 100 every day, metformin ER 500 mg - 2 qam   Hyperlipidemia Lab Results  Component Value Date   LDLCALC 35 11/03/2022   Stable, pt to continue current statin crestor 40 qd   Tobacco abuse Pt counsled to quit , pt not ready  Followup: Return if symptoms worsen or fail to improve.  Oliver Barre, MD 11/18/2022 7:54 PM [facility]

## 2022-11-18 ENCOUNTER — Encounter: Payer: Self-pay | Admitting: Internal Medicine

## 2022-11-18 NOTE — Assessment & Plan Note (Signed)
Lab Results  Component Value Date   LDLCALC 35 11/03/2022   Stable, pt to continue current statin crestor 40 qd

## 2022-11-18 NOTE — Assessment & Plan Note (Signed)
Improved at d/c, for f/u lab today; it is concerning he was just place on mobic post hospn for AKI per ortho, will need to follow this closely

## 2022-11-18 NOTE — Assessment & Plan Note (Signed)
Lab Results  Component Value Date   HGBA1C 6.6 (H) 11/03/2022   Stable, pt to continue current medical treatment januvia 100 every day, metformin ER 500 mg - 2 qam

## 2022-11-18 NOTE — Assessment & Plan Note (Signed)
Pt counsled to quit, pt not ready °

## 2022-11-24 ENCOUNTER — Ambulatory Visit: Payer: 59 | Admitting: Internal Medicine

## 2022-12-02 ENCOUNTER — Ambulatory Visit: Payer: 59 | Admitting: Internal Medicine

## 2022-12-08 ENCOUNTER — Other Ambulatory Visit: Payer: Self-pay | Admitting: Internal Medicine

## 2022-12-08 ENCOUNTER — Ambulatory Visit (INDEPENDENT_AMBULATORY_CARE_PROVIDER_SITE_OTHER): Payer: 59 | Admitting: Internal Medicine

## 2022-12-08 ENCOUNTER — Encounter: Payer: Self-pay | Admitting: Internal Medicine

## 2022-12-08 VITALS — BP 124/76 | HR 68 | Temp 99.2°F | Ht 73.0 in | Wt 167.0 lb

## 2022-12-08 DIAGNOSIS — I10 Essential (primary) hypertension: Secondary | ICD-10-CM

## 2022-12-08 DIAGNOSIS — N179 Acute kidney failure, unspecified: Secondary | ICD-10-CM

## 2022-12-08 DIAGNOSIS — N171 Acute kidney failure with acute cortical necrosis: Secondary | ICD-10-CM | POA: Diagnosis not present

## 2022-12-08 DIAGNOSIS — N1831 Chronic kidney disease, stage 3a: Secondary | ICD-10-CM | POA: Diagnosis not present

## 2022-12-08 DIAGNOSIS — D649 Anemia, unspecified: Secondary | ICD-10-CM

## 2022-12-08 DIAGNOSIS — Z720 Tobacco use: Secondary | ICD-10-CM | POA: Diagnosis not present

## 2022-12-08 DIAGNOSIS — Z23 Encounter for immunization: Secondary | ICD-10-CM

## 2022-12-08 LAB — CBC WITH DIFFERENTIAL/PLATELET
Basophils Absolute: 0.1 10*3/uL (ref 0.0–0.1)
Basophils Relative: 0.9 % (ref 0.0–3.0)
Eosinophils Absolute: 0.2 10*3/uL (ref 0.0–0.7)
Eosinophils Relative: 2.8 % (ref 0.0–5.0)
HCT: 30 % — ABNORMAL LOW (ref 39.0–52.0)
Hemoglobin: 9.9 g/dL — ABNORMAL LOW (ref 13.0–17.0)
Lymphocytes Relative: 27.9 % (ref 12.0–46.0)
Lymphs Abs: 1.9 10*3/uL (ref 0.7–4.0)
MCHC: 32.9 g/dL (ref 30.0–36.0)
MCV: 97 fl (ref 78.0–100.0)
Monocytes Absolute: 1 10*3/uL (ref 0.1–1.0)
Monocytes Relative: 14.6 % — ABNORMAL HIGH (ref 3.0–12.0)
Neutro Abs: 3.6 10*3/uL (ref 1.4–7.7)
Neutrophils Relative %: 53.8 % (ref 43.0–77.0)
Platelets: 216 10*3/uL (ref 150.0–400.0)
RBC: 3.1 Mil/uL — ABNORMAL LOW (ref 4.22–5.81)
RDW: 13.3 % (ref 11.5–15.5)
WBC: 6.8 10*3/uL (ref 4.0–10.5)

## 2022-12-08 LAB — BASIC METABOLIC PANEL
BUN: 23 mg/dL (ref 6–23)
CO2: 27 meq/L (ref 19–32)
Calcium: 9.7 mg/dL (ref 8.4–10.5)
Chloride: 107 meq/L (ref 96–112)
Creatinine, Ser: 1.98 mg/dL — ABNORMAL HIGH (ref 0.40–1.50)
GFR: 35.28 mL/min — ABNORMAL LOW (ref >60.00)
Glucose, Bld: 84 mg/dL (ref 70–99)
Potassium: 3.6 meq/L (ref 3.5–5.1)
Sodium: 142 meq/L (ref 135–145)

## 2022-12-08 NOTE — Assessment & Plan Note (Signed)
Pt counseled to quit smoking, not ready to quit

## 2022-12-08 NOTE — Assessment & Plan Note (Signed)
Thought related to ATN, for f/u lab today

## 2022-12-08 NOTE — Progress Notes (Signed)
Patient ID: [name], male   DOB: 11-21-1960, 62 y.o.   MRN: [id number]        Chief Complaint: follow up post hospn aug 5 - 9       HPI:  [name] is a 62 y.o. male here overall doing ok post hospn with AKI on ace and hct thought related to lower BP and ATN; Pt denies chest pain, increased sob or doe, wheezing, orthopnea, PND, increased LE swelling, palpitations, dizziness or syncope.   Pt denies polydipsia, polyuria, or new focal neuro s/s.    Pt denies fever, wt loss, night sweats, loss of appetite, or other constitutional symptoms  Resumed ACE recently without difficulty and tolerating well.  No overt bleeding or bruising.  Still smoking, not ready to quit  For flu vax today       Wt Readings from Last 3 Encounters:  12/08/22 167 lb (75.8 kg)  11/14/22 169 lb (76.7 kg)  11/04/22 166 lb 3.2 oz (75.4 kg)   BP Readings from Last 3 Encounters:  12/08/22 124/76  11/14/22 120/66  11/07/22 129/87         Past Medical History:  Diagnosis Date   Anginal pain (HCC)    Chest pain 02/13/2012   echo - EF >55%;mod concentric L ventricular hypertrophy; atrial septum aneurysmal; patent foramen ovale suspected on color flow, LA mil/mod dilation; RV systolic pressure   Chest pain 06/09/2007   echo - EF 45-50%; interarterial shunt; mild valvular regurgitation;    Chest pain 06/09/2007   Myoview - EF 53%; normal perfusion all regions;EKG negative for ischemia    Chronic kidney disease    Diabetes mellitus    type II   DIABETES MELLITUS, TYPE II 08/29/2009   GERD (gastroesophageal reflux disease) 08/12/2010   Hemorrhoids    History of anal fissures    Hyperlipidemia    HYPERLIPIDEMIA 08/29/2009   Hypertension    HYPERTENSION 01/14/2008   Lightheadedness 05/10/2002   30d monitor unremarkable   LVH (left ventricular hypertrophy)    with systolic dysfunction by echo 10/03, EF 45-50% - Dr. Elsie Lincoln   Multinodular goiter 12/06/2015   Nonspecific ST-T wave  electrocardiographic changes 08/11/2006   Myoview - EF 49%; normal perfusion all regions, EKG negative for ischemia   OSA (obstructive sleep apnea)    Preventative health care 08/11/2010   Sleep apnea 08/13/2008   AHI 1.2; sleep study 10/2007 - AHI 9.47at 15cm H2O and 31.3 at 16cm H2O   SLEEP APNEA, OBSTRUCTIVE 01/14/2008   Past Surgical History:  Procedure Laterality Date   ANAL SPHINCTEROTOMY  08/20/11   CARDIAC CATHETERIZATION  10/23/2008   normal coronary arteries and LV function   COLONOSCOPY     IR RADIOLOGIST EVAL & MGMT  04/12/2021   IR RADIOLOGIST EVAL & MGMT  08/13/2021   IR RADIOLOGIST EVAL & MGMT  09/11/2021   IR RADIOLOGIST EVAL & MGMT  12/04/2021   IR RADIOLOGIST EVAL & MGMT  03/06/2022   POLYPECTOMY     RADIOLOGY WITH ANESTHESIA N/A 05/15/2021   Procedure: MICROWAVE ABLATION;  Surgeon: Roanna Banning, MD;  Location: [facility];  Service: Radiology;  Laterality: N/A;   RADIOLOGY WITH ANESTHESIA Right 11/06/2021   Procedure: IR WITH ANESTHESIA RIGHT RENAL CRYOABLATION;  Surgeon: Roanna Banning, MD;  Location: [facility];  Service: Radiology;  Laterality: Right;    reports that he has been smoking cigarettes. He has a 15 pack-year smoking history. He has never used smokeless tobacco. He reports current alcohol use  of about 3.0 standard drinks of alcohol per week. He reports that he does not use drugs. family history includes Heart disease in his father; Hypertension in his mother. Allergies  Allergen Reactions   Sulfa Antibiotics Hives and Swelling   Vicodin [Hydrocodone-Acetaminophen] Itching and Other (See Comments)    Patient states he can take Tylenol without difficulty.   Current Outpatient Medications on File Prior to Visit  Medication Sig Dispense Refill   amLODipine-benazepril (LOTREL) 10-40 MG capsule TAKE 1 CAPSULE BY MOUTH EVERY DAY     hydrALAZINE (APRESOLINE) 50 MG tablet Take 1 tablet (50 mg total) by mouth 3 (three) times daily. 270 tablet 3   JANUVIA 100 MG tablet Take 100 mg  by mouth daily.     metFORMIN (GLUCOPHAGE-XR) 500 MG 24 hr tablet TAKE 2 TABLETS BY MOUTH EVERY DAY WITH BREAKFAST (Patient taking differently: Take 1,000 mg by mouth daily with breakfast.) 180 tablet 3   nebivolol (BYSTOLIC) 10 MG tablet Take 2 tablets (20 mg total) by mouth daily. 180 tablet 3   omeprazole (PRILOSEC) 40 MG capsule TAKE 1 CAPSULE BY MOUTH EVERY DAY (Patient taking differently: Take 40 mg by mouth daily before breakfast.) 90 capsule 3   rosuvastatin (CRESTOR) 40 MG tablet Take 1 tablet (40 mg total) by mouth daily. 90 tablet 3   No current facility-administered medications on file prior to visit.        ROS:  All others reviewed and negative.  Objective        PE:  BP 124/76 (BP Location: Left Arm, Patient Position: Sitting, Cuff Size: Normal)   Pulse 68   Temp 99.2 F (37.3 C) (Oral)   Ht 6\' 1"  (1.854 m)   Wt 167 lb (75.8 kg)   SpO2 98%   BMI 22.03 kg/m                 Constitutional: Pt appears in NAD               HENT: Head: NCAT.                Right Ear: External ear normal.                 Left Ear: External ear normal.                Eyes: . Pupils are equal, round, and reactive to light. Conjunctivae and EOM are normal               Nose: without d/c or deformity               Neck: Neck supple. Gross normal ROM               Cardiovascular: Normal rate and regular rhythm.                 Pulmonary/Chest: Effort normal and breath sounds without rales or wheezing.                Abd:  Soft, NT, ND, + BS, no organomegaly               Neurological: Pt is alert. At baseline orientation, motor grossly intact               Skin: Skin is warm. No rashes, no other new lesions, LE edema - none               Psychiatric: Pt behavior is normal without agitation  Micro: none  Cardiac tracings I have personally interpreted today:  none  Pertinent Radiological findings (summarize): none   Lab Results  Component Value Date   WBC 6.8 12/08/2022   HGB 9.9 (L)  12/08/2022   HCT 30.0 (L) 12/08/2022   PLT 216.0 12/08/2022   GLUCOSE 84 12/08/2022   CHOL 119 11/03/2022   TRIG 114.0 11/03/2022   HDL 61.70 11/03/2022   LDLDIRECT 117.9 01/14/2008   LDLCALC 35 11/03/2022   ALT 12 11/04/2022   AST 13 (L) 11/04/2022   NA 142 12/08/2022   K 3.6 12/08/2022   CL 107 12/08/2022   CREATININE 1.98 (H) 12/08/2022   BUN 23 12/08/2022   CO2 27 12/08/2022   TSH 0.604 11/03/2022   PSA 0.66 09/17/2022   INR 1.0 10/29/2021   HGBA1C 6.6 (H) 11/03/2022   MICROALBUR 3.6 (H) 09/17/2022   Assessment/Plan:  [name] is a 62 y.o. Black or African American [2] male with  has a past medical history of Anginal pain (HCC), Chest pain (02/13/2012), Chest pain (06/09/2007), Chest pain (06/09/2007), Chronic kidney disease, Diabetes mellitus, DIABETES MELLITUS, TYPE II (08/29/2009), GERD (gastroesophageal reflux disease) (08/12/2010), Hemorrhoids, History of anal fissures, Hyperlipidemia, HYPERLIPIDEMIA (08/29/2009), Hypertension, HYPERTENSION (01/14/2008), Lightheadedness (05/10/2002), LVH (left ventricular hypertrophy), Multinodular goiter (12/06/2015), Nonspecific ST-T wave electrocardiographic changes (08/11/2006), OSA (obstructive sleep apnea), Preventative health care (08/11/2010), Sleep apnea (08/13/2008), and SLEEP APNEA, OBSTRUCTIVE (01/14/2008).  Acute renal failure superimposed on chronic kidney disease (HCC) Thought related to ATN, for f/u lab today  Tobacco abuse Pt counseled to quit smoking, not ready to quit  Essential hypertension BP Readings from Last 3 Encounters:  12/08/22 124/76  11/14/22 120/66  11/07/22 129/87   Stable, pt to continue medical treatment but continue to hold hct,  to f/u any worsening symptoms or concerns     Anemia Recent worsening, etiology unclear, for f/u cbc and iron lab  Followup: Return in about 3 months (around 03/20/2023).  Oliver Barre, MD 12/08/2022 7:43 PM [facility]

## 2022-12-08 NOTE — Assessment & Plan Note (Signed)
Recent worsening, etiology unclear, for f/u cbc and iron lab

## 2022-12-08 NOTE — Assessment & Plan Note (Signed)
BP Readings from Last 3 Encounters:  12/08/22 124/76  11/14/22 120/66  11/07/22 129/87   Stable, pt to continue medical treatment but continue to hold hct,  to f/u any worsening symptoms or concerns

## 2022-12-08 NOTE — Patient Instructions (Signed)
Please continue all other medications as before, and refills have been done if requested.  Please have the pharmacy call with any other refills you may need.  Please keep your appointments with your specialists as you may have planned  Please go to the LAB at the blood drawing area for the tests to be done  You will be contacted by phone if any changes need to be made immediately.  Otherwise, you will receive a letter about your results with an explanation, but please check with MyChart first.

## 2022-12-11 ENCOUNTER — Telehealth: Payer: Self-pay | Admitting: Internal Medicine

## 2022-12-11 NOTE — Telephone Encounter (Signed)
Called and let Pt know, scheduled appt.

## 2022-12-11 NOTE — Telephone Encounter (Signed)
Pt had a flu shot on Monday and later on that day pt started having pain in his toe and it was warm to touch and wanting to know is the flu shot is a side effect from the shot.  Pt also stated he never had a reaction to a flu shot. Please advise.  Okay to leave VM.

## 2022-12-11 NOTE — Telephone Encounter (Signed)
That would be very unlikely to be a side effect from the vaccine, so I am not sure what else to say; he may need to consider ROV if worsening   thanks

## 2022-12-12 ENCOUNTER — Ambulatory Visit: Payer: 59 | Admitting: Podiatry

## 2022-12-12 ENCOUNTER — Encounter: Payer: Self-pay | Admitting: Podiatry

## 2022-12-12 DIAGNOSIS — M7752 Other enthesopathy of left foot: Secondary | ICD-10-CM | POA: Diagnosis not present

## 2022-12-12 DIAGNOSIS — M778 Other enthesopathies, not elsewhere classified: Secondary | ICD-10-CM

## 2022-12-12 DIAGNOSIS — M10072 Idiopathic gout, left ankle and foot: Secondary | ICD-10-CM

## 2022-12-12 MED ORDER — METHYLPREDNISOLONE 4 MG PO TBPK: ORAL_TABLET | ORAL | 0 refills | Status: DC

## 2022-12-12 NOTE — Progress Notes (Addendum)
Chief Complaint  Patient presents with   Foot Pain    Patient is here for left foot pain and swelling ( hot)    HPI: 62 y.o. male presenting today for acute onset of pain and tenderness associated to the left foot.  Denies any history of injury.  Idiopathic onset.  Patient has known history of gout secondary to chronic kidney disease.  Presenting for further treatment evaluation  Past Medical History:  Diagnosis Date   Anginal pain (HCC)    Chest pain 02/13/2012   echo - EF >55%;mod concentric L ventricular hypertrophy; atrial septum aneurysmal; patent foramen ovale suspected on color flow, LA mil/mod dilation; RV systolic pressure   Chest pain 06/09/2007   echo - EF 45-50%; interarterial shunt; mild valvular regurgitation;    Chest pain 06/09/2007   Myoview - EF 53%; normal perfusion all regions;EKG negative for ischemia    Chronic kidney disease    Diabetes mellitus    type II   DIABETES MELLITUS, TYPE II 08/29/2009   GERD (gastroesophageal reflux disease) 08/12/2010   Hemorrhoids    History of anal fissures    Hyperlipidemia    HYPERLIPIDEMIA 08/29/2009   Hypertension    HYPERTENSION 01/14/2008   Lightheadedness 05/10/2002   30d monitor unremarkable   LVH (left ventricular hypertrophy)    with systolic dysfunction by echo 10/03, EF 45-50% - Dr. Elsie Lincoln   Multinodular goiter 12/06/2015   Nonspecific ST-T wave electrocardiographic changes 08/11/2006   Myoview - EF 49%; normal perfusion all regions, EKG negative for ischemia   OSA (obstructive sleep apnea)    Preventative health care 08/11/2010   Sleep apnea 08/13/2008   AHI 1.2; sleep study 10/2007 - AHI 9.47at 15cm H2O and 31.3 at 16cm H2O   SLEEP APNEA, OBSTRUCTIVE 01/14/2008    Past Surgical History:  Procedure Laterality Date   ANAL SPHINCTEROTOMY  08/20/11   CARDIAC CATHETERIZATION  10/23/2008   normal coronary arteries and LV function   COLONOSCOPY     IR RADIOLOGIST EVAL & MGMT  04/12/2021   IR  RADIOLOGIST EVAL & MGMT  08/13/2021   IR RADIOLOGIST EVAL & MGMT  09/11/2021   IR RADIOLOGIST EVAL & MGMT  12/04/2021   IR RADIOLOGIST EVAL & MGMT  03/06/2022   POLYPECTOMY     RADIOLOGY WITH ANESTHESIA N/A 05/15/2021   Procedure: MICROWAVE ABLATION;  Surgeon: Roanna Banning, MD;  Location: [facility];  Service: Radiology;  Laterality: N/A;   RADIOLOGY WITH ANESTHESIA Right 11/06/2021   Procedure: IR WITH ANESTHESIA RIGHT RENAL CRYOABLATION;  Surgeon: Roanna Banning, MD;  Location: [facility];  Service: Radiology;  Laterality: Right;    Allergies  Allergen Reactions   Sulfa Antibiotics Hives and Swelling   Vicodin [Hydrocodone-Acetaminophen] Itching and Other (See Comments)    Patient states he can take Tylenol without difficulty.     Physical Exam: General: The patient is alert and oriented x3 in no acute distress.  Dermatology: Skin is warm, dry and supple bilateral lower extremities.   Vascular: Palpable pedal pulses bilaterally. Capillary refill within normal limits.  No appreciable edema.  No erythema.  Neurological: Grossly intact via light touch  Musculoskeletal Exam: There is some edema with erythema with pain associated to the great toe joint of the left foot  Radiographic Exam LT foot 12/12/2022:  Normal osseous mineralization. Joint spaces preserved.  No fractures or osseous irregularities noted.  Impression: Negative  Assessment/Plan of Care: 1.  Acute gout left foot  -Patient evaluated.  X-rays reviewed -  Injection of 0.5 cc Celestone Soluspan injected the first MTP left -Prescription for Medrol Dosepak -Stressed the importance of maintaining diet to control and regulate uric acid levels -Return to clinic 3 weeks      Felecia Shelling, DPM [facility]  Dr. Felecia Shelling, DPM    [address] [place], [place] [address]                Office [phone]  Fax [phone]

## 2022-12-15 ENCOUNTER — Other Ambulatory Visit (INDEPENDENT_AMBULATORY_CARE_PROVIDER_SITE_OTHER): Payer: 59

## 2022-12-15 DIAGNOSIS — N179 Acute kidney failure, unspecified: Secondary | ICD-10-CM | POA: Diagnosis not present

## 2022-12-15 DIAGNOSIS — D649 Anemia, unspecified: Secondary | ICD-10-CM | POA: Diagnosis not present

## 2022-12-15 LAB — CBC WITH DIFFERENTIAL/PLATELET
Basophils Absolute: 0 10*3/uL (ref 0.0–0.1)
Basophils Relative: 0.1 % (ref 0.0–3.0)
Eosinophils Absolute: 0 10*3/uL (ref 0.0–0.7)
Eosinophils Relative: 0.1 % (ref 0.0–5.0)
HCT: 30.4 % — ABNORMAL LOW (ref 39.0–52.0)
Hemoglobin: 10.1 g/dL — ABNORMAL LOW (ref 13.0–17.0)
Lymphocytes Relative: 13.2 % (ref 12.0–46.0)
Lymphs Abs: 1.3 10*3/uL (ref 0.7–4.0)
MCHC: 33.1 g/dL (ref 30.0–36.0)
MCV: 96 fl (ref 78.0–100.0)
Monocytes Absolute: 0.8 10*3/uL (ref 0.1–1.0)
Monocytes Relative: 8.5 % (ref 3.0–12.0)
Neutro Abs: 7.6 10*3/uL (ref 1.4–7.7)
Neutrophils Relative %: 78.1 % — ABNORMAL HIGH (ref 43.0–77.0)
Platelets: 261 10*3/uL (ref 150.0–400.0)
RBC: 3.17 Mil/uL — ABNORMAL LOW (ref 4.22–5.81)
RDW: 13.4 % (ref 11.5–15.5)
WBC: 9.7 10*3/uL (ref 4.0–10.5)

## 2022-12-15 LAB — BASIC METABOLIC PANEL
BUN: 33 mg/dL — ABNORMAL HIGH (ref 6–23)
CO2: 26 meq/L (ref 19–32)
Calcium: 9.3 mg/dL (ref 8.4–10.5)
Chloride: 103 meq/L (ref 96–112)
Creatinine, Ser: 1.68 mg/dL — ABNORMAL HIGH (ref 0.40–1.50)
GFR: 42.96 mL/min — ABNORMAL LOW (ref >60.00)
Glucose, Bld: 109 mg/dL — ABNORMAL HIGH (ref 70–99)
Potassium: 3.3 meq/L — ABNORMAL LOW (ref 3.5–5.1)
Sodium: 141 meq/L (ref 135–145)

## 2022-12-15 LAB — IBC PANEL
Iron: 73 ug/dL (ref 42–165)
Saturation Ratios: 29.3 % (ref 20.0–50.0)
TIBC: 249.2 ug/dL — ABNORMAL LOW (ref 250.0–450.0)
Transferrin: 178 mg/dL — ABNORMAL LOW (ref 212.0–360.0)

## 2022-12-15 LAB — FERRITIN: Ferritin: 87.9 ng/mL (ref 22.0–322.0)

## 2022-12-15 NOTE — Progress Notes (Signed)
The test results show that your current treatment is OK, as the tests are stable.  Please continue the same plan.  There is no other need for change of treatment or further evaluation based on these results, at this time.  thanks

## 2022-12-18 ENCOUNTER — Ambulatory Visit: Payer: 59 | Admitting: Internal Medicine

## 2022-12-22 ENCOUNTER — Other Ambulatory Visit: Payer: Self-pay | Admitting: Podiatry

## 2022-12-22 ENCOUNTER — Ambulatory Visit: Payer: 59 | Admitting: Podiatry

## 2022-12-22 DIAGNOSIS — M10072 Idiopathic gout, left ankle and foot: Secondary | ICD-10-CM | POA: Diagnosis not present

## 2022-12-22 MED ORDER — COLCHICINE 0.6 MG PO TABS: 0.6000 mg | ORAL_TABLET | Freq: Every day | ORAL | 1 refills | Status: DC

## 2022-12-22 MED ORDER — METHYLPREDNISOLONE 4 MG PO TBPK: ORAL_TABLET | ORAL | 0 refills | Status: DC

## 2022-12-22 NOTE — Progress Notes (Signed)
Chief Complaint  Patient presents with   Foot Pain    C/o pain to the left hallux joint. Patient was seen a week ago at the [place] office and he was given a steroid injection and it made the pain worst. Patient is not taking any medication at this time.     HPI: 62 y.o. male presenting today for follow-up evaluation of pain and tenderness associated to the left foot secondary to gout.  He continues to have pain and tenderness.  He received a cortisone injection last visit which only helped minimally.  Presenting for further treatment evaluation  Past Medical History:  Diagnosis Date   Anginal pain (HCC)    Chest pain 02/13/2012   echo - EF >55%;mod concentric L ventricular hypertrophy; atrial septum aneurysmal; patent foramen ovale suspected on color flow, LA mil/mod dilation; RV systolic pressure   Chest pain 06/09/2007   echo - EF 45-50%; interarterial shunt; mild valvular regurgitation;    Chest pain 06/09/2007   Myoview - EF 53%; normal perfusion all regions;EKG negative for ischemia    Chronic kidney disease    Diabetes mellitus    type II   DIABETES MELLITUS, TYPE II 08/29/2009   GERD (gastroesophageal reflux disease) 08/12/2010   Hemorrhoids    History of anal fissures    Hyperlipidemia    HYPERLIPIDEMIA 08/29/2009   Hypertension    HYPERTENSION 01/14/2008   Lightheadedness 05/10/2002   30d monitor unremarkable   LVH (left ventricular hypertrophy)    with systolic dysfunction by echo 10/03, EF 45-50% - Dr. Elsie Lincoln   Multinodular goiter 12/06/2015   Nonspecific ST-T wave electrocardiographic changes 08/11/2006   Myoview - EF 49%; normal perfusion all regions, EKG negative for ischemia   OSA (obstructive sleep apnea)    Preventative health care 08/11/2010   Sleep apnea 08/13/2008   AHI 1.2; sleep study 10/2007 - AHI 9.47at 15cm H2O and 31.3 at 16cm H2O   SLEEP APNEA, OBSTRUCTIVE 01/14/2008    Past Surgical History:  Procedure Laterality Date   ANAL  SPHINCTEROTOMY  08/20/11   CARDIAC CATHETERIZATION  10/23/2008   normal coronary arteries and LV function   COLONOSCOPY     IR RADIOLOGIST EVAL & MGMT  04/12/2021   IR RADIOLOGIST EVAL & MGMT  08/13/2021   IR RADIOLOGIST EVAL & MGMT  09/11/2021   IR RADIOLOGIST EVAL & MGMT  12/04/2021   IR RADIOLOGIST EVAL & MGMT  03/06/2022   POLYPECTOMY     RADIOLOGY WITH ANESTHESIA N/A 05/15/2021   Procedure: MICROWAVE ABLATION;  Surgeon: Roanna Banning, MD;  Location: [facility];  Service: Radiology;  Laterality: N/A;   RADIOLOGY WITH ANESTHESIA Right 11/06/2021   Procedure: IR WITH ANESTHESIA RIGHT RENAL CRYOABLATION;  Surgeon: Roanna Banning, MD;  Location: [facility];  Service: Radiology;  Laterality: Right;    Allergies  Allergen Reactions   Sulfa Antibiotics Hives and Swelling   Vicodin [Hydrocodone-Acetaminophen] Itching and Other (See Comments)    Patient states he can take Tylenol without difficulty.     Physical Exam: General: The patient is alert and oriented x3 in no acute distress.  Dermatology: Skin is warm, dry and supple bilateral lower extremities.   Vascular: Palpable pedal pulses bilaterally. Capillary refill within normal limits.  No appreciable edema.  No erythema.  Neurological: Grossly intact via light touch  Musculoskeletal Exam: There needs to be edema with erythema with pain associated to the great toe joint of the left foot  Radiographic Exam LT foot 12/12/2022:  Normal osseous mineralization. Joint spaces preserved.  No fractures or osseous irregularities noted.  Impression: Negative  Assessment/Plan of Care: 1.  Acute gout left foot  -Patient evaluated.  X-rays reviewed -Declined cortisone injection today -Prescription for Medrol Dosepak -Prescription for colchicine 0.6 mg daily after completion of the Dosepak -Stressed the importance of maintaining diet to control and regulate uric acid levels -Return to clinic 3 weeks      Felecia Shelling, DPM [facility]  Dr. Felecia Shelling, DPM    [address] [place], [place] [address]                Office [phone]  Fax [phone]

## 2022-12-23 LAB — URIC ACID: Uric Acid: 5.4 mg/dL (ref 3.8–8.4)

## 2022-12-28 MED ORDER — BETAMETHASONE SOD PHOS & ACET 6 (3-3) MG/ML IJ SUSP
3.0000 mg | Freq: Once | INTRAMUSCULAR | Status: DC
Start: 2022-12-28 — End: 2023-03-20

## 2022-12-28 NOTE — Addendum Note (Signed)
Addended by: Felecia Shelling on: 12/28/2022 02:04 PM   Modules accepted: Orders, Level of Service

## 2023-03-06 ENCOUNTER — Other Ambulatory Visit: Payer: Self-pay | Admitting: Podiatry

## 2023-03-06 ENCOUNTER — Encounter: Payer: Self-pay | Admitting: Podiatry

## 2023-03-06 MED ORDER — COLCHICINE 0.6 MG PO TABS: 0.6000 mg | ORAL_TABLET | Freq: Every day | ORAL | 1 refills | Status: DC

## 2023-03-06 NOTE — Progress Notes (Signed)
As needed acute gout  Felecia Shelling, DPM [facility]  Dr. Felecia Shelling, DPM    [address] [place], [place] [address]                Office [phone]  Fax [phone]

## 2023-03-09 ENCOUNTER — Ambulatory Visit (INDEPENDENT_AMBULATORY_CARE_PROVIDER_SITE_OTHER): Payer: 59

## 2023-03-09 ENCOUNTER — Encounter: Payer: Self-pay | Admitting: Podiatry

## 2023-03-09 ENCOUNTER — Ambulatory Visit (INDEPENDENT_AMBULATORY_CARE_PROVIDER_SITE_OTHER): Payer: 59 | Admitting: Podiatry

## 2023-03-09 DIAGNOSIS — M778 Other enthesopathies, not elsewhere classified: Secondary | ICD-10-CM | POA: Diagnosis not present

## 2023-03-09 DIAGNOSIS — M10071 Idiopathic gout, right ankle and foot: Secondary | ICD-10-CM | POA: Diagnosis not present

## 2023-03-09 MED ORDER — METHYLPREDNISOLONE 4 MG PO TBPK: ORAL_TABLET | ORAL | 0 refills | Status: DC

## 2023-03-09 MED ORDER — ALLOPURINOL 100 MG PO TABS: 100.0000 mg | ORAL_TABLET | Freq: Every day | ORAL | 1 refills | Status: DC

## 2023-03-09 NOTE — Progress Notes (Signed)
Chief Complaint  Patient presents with   Foot Pain    Patient states that his right foot on the outside of his right foot is very sore to the touch and on his inner side of his foot. Patient is taking medication for pain     HPI: 62 y.o. male presenting today for acute onset of symptomatic gout to the right great toe joint.  Patient has a history of multiple recurrent episodes of gout to the bilateral feet.  It is very painful.  Idiopathic onset of the last few days.  Patient has modified his diet and no longer drinks alcohol but continues to have recurrent gout episodes.  Presenting for further treatment evaluation  Past Medical History:  Diagnosis Date   Anginal pain (HCC)    Chest pain 02/13/2012   echo - EF >55%;mod concentric L ventricular hypertrophy; atrial septum aneurysmal; patent foramen ovale suspected on color flow, LA mil/mod dilation; RV systolic pressure   Chest pain 06/09/2007   echo - EF 45-50%; interarterial shunt; mild valvular regurgitation;    Chest pain 06/09/2007   Myoview - EF 53%; normal perfusion all regions;EKG negative for ischemia    Chronic kidney disease    Diabetes mellitus    type II   DIABETES MELLITUS, TYPE II 08/29/2009   GERD (gastroesophageal reflux disease) 08/12/2010   Hemorrhoids    History of anal fissures    Hyperlipidemia    HYPERLIPIDEMIA 08/29/2009   Hypertension    HYPERTENSION 01/14/2008   Lightheadedness 05/10/2002   30d monitor unremarkable   LVH (left ventricular hypertrophy)    with systolic dysfunction by echo 10/03, EF 45-50% - Dr. Elsie Lincoln   Multinodular goiter 12/06/2015   Nonspecific ST-T wave electrocardiographic changes 08/11/2006   Myoview - EF 49%; normal perfusion all regions, EKG negative for ischemia   OSA (obstructive sleep apnea)    Preventative health care 08/11/2010   Sleep apnea 08/13/2008   AHI 1.2; sleep study 10/2007 - AHI 9.47at 15cm H2O and 31.3 at 16cm H2O   SLEEP APNEA, OBSTRUCTIVE 01/14/2008     Past Surgical History:  Procedure Laterality Date   ANAL SPHINCTEROTOMY  08/20/11   CARDIAC CATHETERIZATION  10/23/2008   normal coronary arteries and LV function   COLONOSCOPY     IR RADIOLOGIST EVAL & MGMT  04/12/2021   IR RADIOLOGIST EVAL & MGMT  08/13/2021   IR RADIOLOGIST EVAL & MGMT  09/11/2021   IR RADIOLOGIST EVAL & MGMT  12/04/2021   IR RADIOLOGIST EVAL & MGMT  03/06/2022   POLYPECTOMY     RADIOLOGY WITH ANESTHESIA N/A 05/15/2021   Procedure: MICROWAVE ABLATION;  Surgeon: Roanna Banning, MD;  Location: [facility];  Service: Radiology;  Laterality: N/A;   RADIOLOGY WITH ANESTHESIA Right 11/06/2021   Procedure: IR WITH ANESTHESIA RIGHT RENAL CRYOABLATION;  Surgeon: Roanna Banning, MD;  Location: [facility];  Service: Radiology;  Laterality: Right;    Allergies  Allergen Reactions   Sulfa Antibiotics Hives and Swelling   Vicodin [Hydrocodone-Acetaminophen] Itching and Other (See Comments)    Patient states he can take Tylenol without difficulty.     Physical Exam: General: The patient is alert and oriented x3 in no acute distress.  Dermatology: Skin is warm, dry and supple bilateral lower extremities.   Vascular: Palpable pedal pulses bilaterally. Capillary refill within normal limits.  Erythema with edema noted to the right great toe joint consistent with history of gout  Neurological: Grossly intact via light touch  Musculoskeletal Exam:  No pedal deformities noted.  Severe pain with light palpation to the right great toe joint.  Radiographic Exam RT foot 03/09/2023:  Normal osseous mineralization. Joint spaces preserved.  No fractures or osseous irregularities noted.  Impression: Negative  Assessment/Plan of Care: 1.  Acute onset of gout right great toe 2.  History of multiple episodes of gout over the past year  -Patient evaluated -Patient declined cortisone injection -Prescription for Medrol Dosepak, then continue the colchicine 0.6 mg daily until symptoms resolve -Patient has a  surgical shoe at home.  Wear daily -Prescription for allopurinol 100 mg daily since the patient has history of multiple recurrent gout attacks over the past year despite diet management -Patient also has a follow-up appointment with his PCP on 03/20/2023.  I do believe that it would be appropriate for him to manage his allopurinol long-term -Note for work was provided today.  Refrain from work through 03/18/2023 -Return to clinic with me as needed      Felecia Shelling, DPM [facility]  Dr. Felecia Shelling, DPM    [address] [place], [place] [address]                Office [phone]  Fax [phone]

## 2023-03-16 ENCOUNTER — Other Ambulatory Visit (INDEPENDENT_AMBULATORY_CARE_PROVIDER_SITE_OTHER): Payer: 59

## 2023-03-16 DIAGNOSIS — E538 Deficiency of other specified B group vitamins: Secondary | ICD-10-CM | POA: Diagnosis not present

## 2023-03-16 DIAGNOSIS — E0800 Diabetes mellitus due to underlying condition with hyperosmolarity without nonketotic hyperglycemic-hyperosmolar coma (NKHHC): Secondary | ICD-10-CM | POA: Diagnosis not present

## 2023-03-16 DIAGNOSIS — E559 Vitamin D deficiency, unspecified: Secondary | ICD-10-CM

## 2023-03-16 DIAGNOSIS — Z794 Long term (current) use of insulin: Secondary | ICD-10-CM

## 2023-03-16 DIAGNOSIS — E870 Hyperosmolality and hypernatremia: Secondary | ICD-10-CM | POA: Diagnosis not present

## 2023-03-16 LAB — HEPATIC FUNCTION PANEL
ALT: 8 U/L (ref 0–53)
AST: 9 U/L (ref 0–37)
Albumin: 4.2 g/dL (ref 3.5–5.2)
Alkaline Phosphatase: 77 U/L (ref 39–117)
Bilirubin, Direct: 0.1 mg/dL (ref 0.0–0.3)
Total Bilirubin: 0.3 mg/dL (ref 0.2–1.2)
Total Protein: 6.4 g/dL (ref 6.0–8.3)

## 2023-03-16 LAB — BASIC METABOLIC PANEL
BUN: 22 mg/dL (ref 6–23)
CO2: 30 meq/L (ref 19–32)
Calcium: 9.3 mg/dL (ref 8.4–10.5)
Chloride: 102 meq/L (ref 96–112)
Creatinine, Ser: 1.59 mg/dL — ABNORMAL HIGH (ref 0.40–1.50)
GFR: 45.81 mL/min — ABNORMAL LOW (ref >60.00)
Glucose, Bld: 101 mg/dL — ABNORMAL HIGH (ref 70–99)
Potassium: 3.6 meq/L (ref 3.5–5.1)
Sodium: 141 meq/L (ref 135–145)

## 2023-03-16 LAB — LIPID PANEL
Cholesterol: 128 mg/dL (ref 0–200)
HDL: 47 mg/dL (ref >39.00)
LDL Cholesterol: 69 mg/dL (ref 0–99)
NonHDL: 80.61
Total CHOL/HDL Ratio: 3
Triglycerides: 58 mg/dL (ref 0.0–149.0)
VLDL: 11.6 mg/dL (ref 0.0–40.0)

## 2023-03-16 LAB — HEMOGLOBIN A1C: Hgb A1c MFr Bld: 6.3 % (ref 4.6–6.5)

## 2023-03-16 LAB — VITAMIN B12: Vitamin B-12: 479 pg/mL (ref 211–911)

## 2023-03-16 LAB — VITAMIN D 25 HYDROXY (VIT D DEFICIENCY, FRACTURES): VITD: 54.13 ng/mL (ref 30.00–100.00)

## 2023-03-20 ENCOUNTER — Ambulatory Visit (INDEPENDENT_AMBULATORY_CARE_PROVIDER_SITE_OTHER): Payer: 59 | Admitting: Internal Medicine

## 2023-03-20 ENCOUNTER — Encounter: Payer: Self-pay | Admitting: Internal Medicine

## 2023-03-20 VITALS — BP 128/64 | HR 71 | Temp 98.2°F | Ht 73.0 in | Wt 172.0 lb

## 2023-03-20 DIAGNOSIS — E119 Type 2 diabetes mellitus without complications: Secondary | ICD-10-CM | POA: Diagnosis not present

## 2023-03-20 DIAGNOSIS — Z125 Encounter for screening for malignant neoplasm of prostate: Secondary | ICD-10-CM

## 2023-03-20 DIAGNOSIS — E782 Mixed hyperlipidemia: Secondary | ICD-10-CM

## 2023-03-20 DIAGNOSIS — E559 Vitamin D deficiency, unspecified: Secondary | ICD-10-CM

## 2023-03-20 DIAGNOSIS — E0800 Diabetes mellitus due to underlying condition with hyperosmolarity without nonketotic hyperglycemic-hyperosmolar coma (NKHHC): Secondary | ICD-10-CM

## 2023-03-20 DIAGNOSIS — E538 Deficiency of other specified B group vitamins: Secondary | ICD-10-CM | POA: Diagnosis not present

## 2023-03-20 DIAGNOSIS — N1831 Chronic kidney disease, stage 3a: Secondary | ICD-10-CM

## 2023-03-20 DIAGNOSIS — I10 Essential (primary) hypertension: Secondary | ICD-10-CM | POA: Diagnosis not present

## 2023-03-20 DIAGNOSIS — M109 Gout, unspecified: Secondary | ICD-10-CM

## 2023-03-20 DIAGNOSIS — Z794 Long term (current) use of insulin: Secondary | ICD-10-CM

## 2023-03-20 MED ORDER — ALLOPURINOL 100 MG PO TABS: 100.0000 mg | ORAL_TABLET | Freq: Every day | ORAL | 3 refills | Status: DC

## 2023-03-20 MED ORDER — COLCHICINE 0.6 MG PO TABS: 0.6000 mg | ORAL_TABLET | Freq: Two times a day (BID) | ORAL | 3 refills | Status: DC

## 2023-03-20 MED ORDER — SITAGLIPTIN 100 MG PO TABS: 100.0000 mg | ORAL_TABLET | Freq: Every day | ORAL | 3 refills | Status: AC

## 2023-03-20 MED ORDER — METHYLPREDNISOLONE 4 MG PO TBPK: ORAL_TABLET | ORAL | 0 refills | Status: DC

## 2023-03-20 NOTE — Patient Instructions (Signed)
Please take all new medication as prescribed  Please continue all other medications as before, and refills have been done if requested.  Please have the pharmacy call with any other refills you may need.  Please continue your efforts at being more active, low cholesterol diet, and weight control.  Please keep your appointments with your specialists as you may have planned  Please make an Appointment to return in 6 months, or sooner if needed, also with Lab Appointment for testing done 3-5 days before at the FIRST FLOOR Lab (so this is for TWO appointments - please see the scheduling desk as you leave)

## 2023-03-20 NOTE — Assessment & Plan Note (Signed)
Mild to mod, for medrol pack, increase cochcine 0.6 bid, and continue allopurinol 100 every day,,  to f/u any worsening symptoms or concerns

## 2023-03-20 NOTE — Assessment & Plan Note (Signed)
Lab Results  Component Value Date   LDLCALC 69 03/16/2023   Stable, pt to continue current statin crestor 40 qd

## 2023-03-20 NOTE — Assessment & Plan Note (Signed)
Lab Results  Component Value Date   HGBA1C 6.3 03/16/2023   Stable, pt to continue current medical treatment metformin ER 500 mg - 2 every day, zituvio 100 qd

## 2023-03-20 NOTE — Assessment & Plan Note (Signed)
Lab Results  Component Value Date   CREATININE 1.59 (H) 03/16/2023   Stable overall, cont to avoid nephrotoxins

## 2023-03-20 NOTE — Assessment & Plan Note (Signed)
Lab Results  Component Value Date   VITAMINB12 479 03/16/2023   Stable, cont oral replacement - b12 1000 mcg qd

## 2023-03-20 NOTE — Progress Notes (Signed)
Patient ID: [name], male   DOB: 1960-04-29, 62 y.o.   MRN: [id number]        Chief Complaint: follow up acute gouty arthritis right foot, dm, htn, hld, low b12       HPI:  [name] is a 62 y.o. male here after treated about 2 wks ago per podiatry for acute gout right foot first MTP with medrol pack and improved, place on colchicine and allopurinol with good complianc but now with early gout starting back in the right first MTP x 2 days.  Pt denies chest pain, increased sob or doe, wheezing, orthopnea, PND, increased LE swelling, palpitations, dizziness or syncope.   Pt denies polydipsia, polyuria, or new focal neuro s/s.    Pt denies fever, wt loss, night sweats, loss of appetite, or other constitutional symptoms  No trauma.         Wt Readings from Last 3 Encounters:  03/20/23 172 lb (78 kg)  12/08/22 167 lb (75.8 kg)  11/14/22 169 lb (76.7 kg)   BP Readings from Last 3 Encounters:  03/20/23 128/64  12/08/22 124/76  11/14/22 120/66         Past Medical History:  Diagnosis Date   Anginal pain (HCC)    Chest pain 02/13/2012   echo - EF >55%;mod concentric L ventricular hypertrophy; atrial septum aneurysmal; patent foramen ovale suspected on color flow, LA mil/mod dilation; RV systolic pressure   Chest pain 06/09/2007   echo - EF 45-50%; interarterial shunt; mild valvular regurgitation;    Chest pain 06/09/2007   Myoview - EF 53%; normal perfusion all regions;EKG negative for ischemia    Chronic kidney disease    Diabetes mellitus    type II   DIABETES MELLITUS, TYPE II 08/29/2009   GERD (gastroesophageal reflux disease) 08/12/2010   Hemorrhoids    History of anal fissures    Hyperlipidemia    HYPERLIPIDEMIA 08/29/2009   Hypertension    HYPERTENSION 01/14/2008   Lightheadedness 05/10/2002   30d monitor unremarkable   LVH (left ventricular hypertrophy)    with systolic dysfunction by echo 10/03, EF 45-50% - Dr. Elsie Lincoln   Multinodular goiter 12/06/2015    Nonspecific ST-T wave electrocardiographic changes 08/11/2006   Myoview - EF 49%; normal perfusion all regions, EKG negative for ischemia   OSA (obstructive sleep apnea)    Preventative health care 08/11/2010   Sleep apnea 08/13/2008   AHI 1.2; sleep study 10/2007 - AHI 9.47at 15cm H2O and 31.3 at 16cm H2O   SLEEP APNEA, OBSTRUCTIVE 01/14/2008   Past Surgical History:  Procedure Laterality Date   ANAL SPHINCTEROTOMY  08/20/11   CARDIAC CATHETERIZATION  10/23/2008   normal coronary arteries and LV function   COLONOSCOPY     IR RADIOLOGIST EVAL & MGMT  04/12/2021   IR RADIOLOGIST EVAL & MGMT  08/13/2021   IR RADIOLOGIST EVAL & MGMT  09/11/2021   IR RADIOLOGIST EVAL & MGMT  12/04/2021   IR RADIOLOGIST EVAL & MGMT  03/06/2022   POLYPECTOMY     RADIOLOGY WITH ANESTHESIA N/A 05/15/2021   Procedure: MICROWAVE ABLATION;  Surgeon: Roanna Banning, MD;  Location: [facility];  Service: Radiology;  Laterality: N/A;   RADIOLOGY WITH ANESTHESIA Right 11/06/2021   Procedure: IR WITH ANESTHESIA RIGHT RENAL CRYOABLATION;  Surgeon: Roanna Banning, MD;  Location: [facility];  Service: Radiology;  Laterality: Right;    reports that he has been smoking cigarettes. He has a 15 pack-year smoking history. He has never used smokeless  tobacco. He reports current alcohol use of about 3.0 standard drinks of alcohol per week. He reports that he does not use drugs. family history includes Heart disease in his father; Hypertension in his mother. Allergies  Allergen Reactions   Sulfa Antibiotics Hives and Swelling   Vicodin [Hydrocodone-Acetaminophen] Itching and Other (See Comments)    Patient states he can take Tylenol without difficulty.   Current Outpatient Medications on File Prior to Visit  Medication Sig Dispense Refill   amLODipine-benazepril (LOTREL) 10-40 MG capsule TAKE 1 CAPSULE BY MOUTH EVERY DAY     hydrALAZINE (APRESOLINE) 50 MG tablet Take 1 tablet (50 mg total) by mouth 3 (three) times daily. 270 tablet 3    metFORMIN (GLUCOPHAGE-XR) 500 MG 24 hr tablet TAKE 2 TABLETS BY MOUTH EVERY DAY WITH BREAKFAST (Patient taking differently: Take 1,000 mg by mouth daily with breakfast.) 180 tablet 3   nebivolol (BYSTOLIC) 10 MG tablet Take 2 tablets (20 mg total) by mouth daily. 180 tablet 3   omeprazole (PRILOSEC) 40 MG capsule TAKE 1 CAPSULE BY MOUTH EVERY DAY (Patient taking differently: Take 40 mg by mouth daily before breakfast.) 90 capsule 3   rosuvastatin (CRESTOR) 40 MG tablet Take 1 tablet (40 mg total) by mouth daily. 90 tablet 3   No current facility-administered medications on file prior to visit.        ROS:  All others reviewed and negative.  Objective        PE:  BP 128/64 (BP Location: Left Arm, Patient Position: Sitting, Cuff Size: Normal)   Pulse 71   Temp 98.2 F (36.8 C) (Oral)   Ht 6\' 1"  (1.854 m)   Wt 172 lb (78 kg)   SpO2 98%   BMI 22.69 kg/m                 Constitutional: Pt appears in NAD               HENT: Head: NCAT.                Right Ear: External ear normal.                 Left Ear: External ear normal.                Eyes: . Pupils are equal, round, and reactive to light. Conjunctivae and EOM are normal               Nose: without d/c or deformity               Neck: Neck supple. Gross normal ROM               Cardiovascular: Normal rate and regular rhythm.                 Pulmonary/Chest: Effort normal and breath sounds without rales or wheezing.                Abd:  Soft, NT, ND, + BS, no organomegaly               Neurological: Pt is alert. At baseline orientation, motor grossly intact               Skin: Skin is warm. No rashes, no other new lesions, LE edema - none but right first MTP with 1+ tender red swelling               Psychiatric: Pt behavior is normal without  agitation   Micro: none  Cardiac tracings I have personally interpreted today:  none  Pertinent Radiological findings (summarize): none   Lab Results  Component Value Date   WBC 9.7  12/15/2022   HGB 10.1 (L) 12/15/2022   HCT 30.4 (L) 12/15/2022   PLT 261.0 12/15/2022   GLUCOSE 101 (H) 03/16/2023   CHOL 128 03/16/2023   TRIG 58.0 03/16/2023   HDL 47.00 03/16/2023   LDLDIRECT 117.9 01/14/2008   LDLCALC 69 03/16/2023   ALT 8 03/16/2023   AST 9 03/16/2023   NA 141 03/16/2023   K 3.6 03/16/2023   CL 102 03/16/2023   CREATININE 1.59 (H) 03/16/2023   BUN 22 03/16/2023   CO2 30 03/16/2023   TSH 0.604 11/03/2022   PSA 0.66 09/17/2022   INR 1.0 10/29/2021   HGBA1C 6.3 03/16/2023   MICROALBUR 3.6 (H) 09/17/2022   Assessment/Plan:  [name] is a 62 y.o. Black or African American [2] male with  has a past medical history of Anginal pain (HCC), Chest pain (02/13/2012), Chest pain (06/09/2007), Chest pain (06/09/2007), Chronic kidney disease, Diabetes mellitus, DIABETES MELLITUS, TYPE II (08/29/2009), GERD (gastroesophageal reflux disease) (08/12/2010), Hemorrhoids, History of anal fissures, Hyperlipidemia, HYPERLIPIDEMIA (08/29/2009), Hypertension, HYPERTENSION (01/14/2008), Lightheadedness (05/10/2002), LVH (left ventricular hypertrophy), Multinodular goiter (12/06/2015), Nonspecific ST-T wave electrocardiographic changes (08/11/2006), OSA (obstructive sleep apnea), Preventative health care (08/11/2010), Sleep apnea (08/13/2008), and SLEEP APNEA, OBSTRUCTIVE (01/14/2008).  Diabetes (HCC) Lab Results  Component Value Date   HGBA1C 6.3 03/16/2023   Stable, pt to continue current medical treatment metformin ER 500 mg - 2 every day, zituvio 100 qd   Hyperlipidemia Lab Results  Component Value Date   LDLCALC 69 03/16/2023   Stable, pt to continue current statin crestor 40 qd   Essential hypertension BP Readings from Last 3 Encounters:  03/20/23 128/64  12/08/22 124/76  11/14/22 120/66   Stable, pt to continue medical treatment lotrel 10 40 every day, hydralazine 50 tid, bystolic 20 qd   Acute gouty arthritis Mild to mod, for medrol pack, increase  cochcine 0.6 bid, and continue allopurinol 100 every day,,  to f/u any worsening symptoms or concerns  CKD (chronic kidney disease) stage 3, GFR 30-59 ml/min (HCC) Lab Results  Component Value Date   CREATININE 1.59 (H) 03/16/2023   Stable overall, cont to avoid nephrotoxins   B12 deficiency Lab Results  Component Value Date   VITAMINB12 479 03/16/2023   Stable, cont oral replacement - b12 1000 mcg qd  Followup: Return in about 6 months (around 09/18/2023).  Jonathan Barre, MD 03/20/2023 6:02 PM [facility]

## 2023-03-20 NOTE — Addendum Note (Signed)
Addended by: Corwin Levins on: 03/20/2023 06:04 PM   Modules accepted: Orders

## 2023-03-20 NOTE — Assessment & Plan Note (Signed)
BP Readings from Last 3 Encounters:  03/20/23 128/64  12/08/22 124/76  11/14/22 120/66   Stable, pt to continue medical treatment lotrel 10 40 every day, hydralazine 50 tid, bystolic 20 qd

## 2023-04-08 ENCOUNTER — Telehealth: Payer: Self-pay | Admitting: Podiatry

## 2023-04-08 NOTE — Telephone Encounter (Signed)
 Completed Attending Physician Statement for [employer] Writer) .Marland Kitchen...  Faxed to [phone] (provided on form) ....   Called patient and advised same.  Dr's letter is in file dated 03/09/23 - RTW approved for 03/18/2023.            J. Abbott -- 04/08/2023

## 2023-04-24 ENCOUNTER — Other Ambulatory Visit (HOSPITAL_COMMUNITY): Payer: Self-pay

## 2023-05-11 ENCOUNTER — Encounter: Payer: Self-pay | Admitting: Internal Medicine

## 2023-05-11 DIAGNOSIS — E0800 Diabetes mellitus due to underlying condition with hyperosmolarity without nonketotic hyperglycemic-hyperosmolar coma (NKHHC): Secondary | ICD-10-CM

## 2023-05-13 ENCOUNTER — Telehealth: Payer: Self-pay | Admitting: Internal Medicine

## 2023-05-13 NOTE — Telephone Encounter (Signed)
I had referred this to [facility], so it may take some time .   Ok for samples if we have any, thanks

## 2023-05-13 NOTE — Telephone Encounter (Signed)
Copied from CRM [id number]. Topic: Clinical - Prescription Issue >> May 13, 2023  9:23 AM Theodis Sato wrote: Reason for CRM: Patient states he is out of of his Januvia and that he informed Dr. Jonny Ruiz of this and has still not heard from him about any alternative medications as his insurance does not cover Januvia any longer. Please call this patient back as he frustrated no one has tried to contact him/update him.

## 2023-05-14 MED ORDER — EMPAGLIFLOZIN 10 MG PO TABS: 10.0000 mg | ORAL_TABLET | Freq: Every day | ORAL | 11 refills | Status: AC

## 2023-05-14 NOTE — Telephone Encounter (Signed)
Patient is wanting to know of you can send in another prescription in place of the [place]?

## 2023-05-14 NOTE — Telephone Encounter (Signed)
Ok I sent jardiance 10 mg to the pharmacy  Ok to cancel the [name] and [name] pharmacy referral as he is obviously not willing to wait for this

## 2023-05-14 NOTE — Addendum Note (Signed)
Addended by: Corwin Levins on: 05/14/2023 04:57 PM   Modules accepted: Orders

## 2023-05-15 NOTE — Telephone Encounter (Signed)
Oh no, not at all, in fact is approved by the FDA to protect the kidney especially for people with diabetes

## 2023-05-25 ENCOUNTER — Other Ambulatory Visit: Payer: Self-pay | Admitting: Podiatry

## 2023-05-25 ENCOUNTER — Other Ambulatory Visit: Payer: Self-pay | Admitting: Internal Medicine

## 2023-05-26 ENCOUNTER — Telehealth: Payer: Self-pay

## 2023-05-26 NOTE — Progress Notes (Signed)
 Care Guide Pharmacy Note  05/26/2023 Name: [name] MRN: [id number] DOB: 1961/01/10  Referred By: Corwin Levins, MD Reason for referral: Care Coordination (Outreach to schedule with Pharm d )   [name] is a 63 y.o. year old male who is a primary care patient of Corwin Levins, MD.  [name] was referred to the pharmacist for assistance related to: DMII  Successful contact was made with the patient to discuss pharmacy services.  Patient declines engagement at this time. Contact information was provided to the patient should they wish to reach out for assistance at a later time.  Penne Lash , RMA     [facility]  [facility] Guide  Direct Dial: [phone]  Website: [email or web address]

## 2023-05-27 ENCOUNTER — Other Ambulatory Visit: Payer: Self-pay | Admitting: Urology

## 2023-05-27 ENCOUNTER — Other Ambulatory Visit: Payer: Self-pay | Admitting: Internal Medicine

## 2023-05-27 DIAGNOSIS — D49512 Neoplasm of unspecified behavior of left kidney: Secondary | ICD-10-CM

## 2023-05-27 DIAGNOSIS — D49511 Neoplasm of unspecified behavior of right kidney: Secondary | ICD-10-CM

## 2023-05-27 NOTE — Telephone Encounter (Signed)
 Copied from CRM [id number]. Topic: Clinical - Medication Refill >> May 27, 2023  4:38 PM Corin V wrote: Most Recent Primary Care Visit:  Provider: Corwin Levins  Department: [facility] [place]  Visit Type: OFFICE VISIT  Date: 03/20/2023  Medication: allopurinol (ZYLOPRIM) 100 MG tablet colchicine 0.6 MG tablet  Has the patient contacted their pharmacy? Yes- pharmacy never got script from Dr. Jonny Ruiz. They still had the script from the previous provider (Agent: If no, request that the patient contact the pharmacy for the refill. If patient does not wish to contact the pharmacy document the reason why and proceed with request.) (Agent: If yes, when and what did the pharmacy advise?)  Is this the correct pharmacy for this prescription? Yes If no, delete pharmacy and type the correct one.  This is the patient's preferred pharmacy:  [facility] [phone] [place], [place] - [address] [place] [address] Phone: [phone] Fax: [phone]   Has the prescription been filled recently? No  Is the patient out of the medication? Yes  Has the patient been seen for an appointment in the last year OR does the patient have an upcoming appointment? Yes  Can we respond through MyChart? No  Agent: Please be advised that Rx refills may take up to 3 business days. We ask that you follow-up with your pharmacy.

## 2023-05-27 NOTE — Telephone Encounter (Signed)
 Last Fill: Allopurinol: 03/20/23    Colchicine:  03/20/23  Last OV: 03/20/23 Next OV: 09/21/23  Routing to provider for review/authorization.

## 2023-05-28 ENCOUNTER — Other Ambulatory Visit: Payer: Self-pay

## 2023-05-28 MED ORDER — COLCHICINE 0.6 MG PO TABS: 0.6000 mg | ORAL_TABLET | Freq: Two times a day (BID) | ORAL | 3 refills | Status: AC

## 2023-05-28 MED ORDER — ALLOPURINOL 100 MG PO TABS: 100.0000 mg | ORAL_TABLET | Freq: Every day | ORAL | 3 refills | Status: AC

## 2023-06-08 ENCOUNTER — Encounter: Payer: Self-pay | Admitting: Internal Medicine

## 2023-07-11 ENCOUNTER — Ambulatory Visit
Admission: RE | Admit: 2023-07-11 | Discharge: 2023-07-11 | Disposition: A | Discharge status: Home / Self Care | Payer: 59 | Source: Ambulatory Visit | Attending: Urology | Admitting: Urology

## 2023-07-11 DIAGNOSIS — D49512 Neoplasm of unspecified behavior of left kidney: Secondary | ICD-10-CM

## 2023-07-11 DIAGNOSIS — D49511 Neoplasm of unspecified behavior of right kidney: Secondary | ICD-10-CM

## 2023-07-11 MED ORDER — GADOPICLENOL 0.5 MMOL/ML IV SOLN
7.5000 mL | Freq: Once | INTRAVENOUS | Status: AC | PRN
  Administered 2023-07-11: 7.5 mL via INTRAVENOUS

## 2023-07-23 ENCOUNTER — Other Ambulatory Visit: Payer: Self-pay | Admitting: Urology

## 2023-07-23 DIAGNOSIS — D49512 Neoplasm of unspecified behavior of left kidney: Secondary | ICD-10-CM

## 2023-09-07 ENCOUNTER — Other Ambulatory Visit: Payer: Self-pay | Admitting: Interventional Radiology

## 2023-09-07 ENCOUNTER — Other Ambulatory Visit

## 2023-09-07 DIAGNOSIS — N2889 Other specified disorders of kidney and ureter: Secondary | ICD-10-CM

## 2023-09-08 ENCOUNTER — Other Ambulatory Visit: Payer: Self-pay

## 2023-09-08 ENCOUNTER — Other Ambulatory Visit: Payer: Self-pay | Admitting: Internal Medicine

## 2023-09-17 ENCOUNTER — Other Ambulatory Visit (INDEPENDENT_AMBULATORY_CARE_PROVIDER_SITE_OTHER)

## 2023-09-17 DIAGNOSIS — E119 Type 2 diabetes mellitus without complications: Secondary | ICD-10-CM | POA: Diagnosis not present

## 2023-09-17 DIAGNOSIS — Z794 Long term (current) use of insulin: Secondary | ICD-10-CM | POA: Diagnosis not present

## 2023-09-17 DIAGNOSIS — E538 Deficiency of other specified B group vitamins: Secondary | ICD-10-CM | POA: Diagnosis not present

## 2023-09-17 DIAGNOSIS — E559 Vitamin D deficiency, unspecified: Secondary | ICD-10-CM

## 2023-09-17 DIAGNOSIS — Z125 Encounter for screening for malignant neoplasm of prostate: Secondary | ICD-10-CM | POA: Diagnosis not present

## 2023-09-17 LAB — HEPATIC FUNCTION PANEL
ALT: 15 U/L (ref 0–53)
AST: 20 U/L (ref 0–37)
Albumin: 4.5 g/dL (ref 3.5–5.2)
Alkaline Phosphatase: 112 U/L (ref 39–117)
Bilirubin, Direct: 0.2 mg/dL (ref 0.0–0.3)
Total Bilirubin: 0.6 mg/dL (ref 0.2–1.2)
Total Protein: 6.8 g/dL (ref 6.0–8.3)

## 2023-09-17 LAB — BASIC METABOLIC PANEL WITH GFR
BUN: 17 mg/dL (ref 6–23)
CO2: 25 meq/L (ref 19–32)
Calcium: 9.6 mg/dL (ref 8.4–10.5)
Chloride: 106 meq/L (ref 96–112)
Creatinine, Ser: 1.86 mg/dL — ABNORMAL HIGH (ref 0.40–1.50)
GFR: 37.82 mL/min — ABNORMAL LOW (ref >60.00)
Glucose, Bld: 102 mg/dL — ABNORMAL HIGH (ref 70–99)
Potassium: 3.1 meq/L — ABNORMAL LOW (ref 3.5–5.1)
Sodium: 142 meq/L (ref 135–145)

## 2023-09-17 LAB — TSH: TSH: 1.2 u[IU]/mL (ref 0.35–5.50)

## 2023-09-17 LAB — CBC WITH DIFFERENTIAL/PLATELET
Basophils Absolute: 0.1 10*3/uL (ref 0.0–0.1)
Basophils Relative: 1.1 % (ref 0.0–3.0)
Eosinophils Absolute: 0.9 10*3/uL — ABNORMAL HIGH (ref 0.0–0.7)
Eosinophils Relative: 13.6 % — ABNORMAL HIGH (ref 0.0–5.0)
HCT: 39.2 % (ref 39.0–52.0)
Hemoglobin: 13.2 g/dL (ref 13.0–17.0)
Lymphocytes Relative: 30.5 % (ref 12.0–46.0)
Lymphs Abs: 2.1 10*3/uL (ref 0.7–4.0)
MCHC: 33.7 g/dL (ref 30.0–36.0)
MCV: 91.4 fl (ref 78.0–100.0)
Monocytes Absolute: 0.9 10*3/uL (ref 0.1–1.0)
Monocytes Relative: 12.9 % — ABNORMAL HIGH (ref 3.0–12.0)
Neutro Abs: 2.9 10*3/uL (ref 1.4–7.7)
Neutrophils Relative %: 41.9 % — ABNORMAL LOW (ref 43.0–77.0)
Platelets: 217 10*3/uL (ref 150.0–400.0)
RBC: 4.29 Mil/uL (ref 4.22–5.81)
RDW: 13.7 % (ref 11.5–15.5)
WBC: 6.8 10*3/uL (ref 4.0–10.5)

## 2023-09-17 LAB — LIPID PANEL
Cholesterol: 116 mg/dL (ref 0–200)
HDL: 41.3 mg/dL (ref >39.00)
LDL Cholesterol: 62 mg/dL (ref 0–99)
NonHDL: 74.92
Total CHOL/HDL Ratio: 3
Triglycerides: 67 mg/dL (ref 0.0–149.0)
VLDL: 13.4 mg/dL (ref 0.0–40.0)

## 2023-09-17 LAB — VITAMIN D 25 HYDROXY (VIT D DEFICIENCY, FRACTURES): VITD: 46.3 ng/mL (ref 30.00–100.00)

## 2023-09-17 LAB — HEMOGLOBIN A1C: Hgb A1c MFr Bld: 6.2 % (ref 4.6–6.5)

## 2023-09-17 LAB — VITAMIN B12: Vitamin B-12: 353 pg/mL (ref 211–911)

## 2023-09-17 LAB — PSA: PSA: 0.51 ng/mL (ref 0.10–4.00)

## 2023-09-18 ENCOUNTER — Ambulatory Visit
Admission: RE | Admit: 2023-09-18 | Discharge: 2023-09-18 | Disposition: A | Discharge status: Home / Self Care | Source: Ambulatory Visit | Attending: Interventional Radiology | Admitting: Interventional Radiology

## 2023-09-18 DIAGNOSIS — N2889 Other specified disorders of kidney and ureter: Secondary | ICD-10-CM

## 2023-09-18 HISTORY — PX: IR RADIOLOGIST EVAL & MGMT: IMG5224

## 2023-09-18 NOTE — Progress Notes (Incomplete)
 Referring Physician(s): Azula Zappia  Supervising Physician: {Supervising Physician:[id number]}  Chief Complaint: The patient is seen in follow up today s/p ***  History of present illness:  ***  Past Medical History:  Diagnosis Date   Anginal pain (HCC)    Chest pain 02/13/2012   echo - EF >55%;mod concentric L ventricular hypertrophy; atrial septum aneurysmal; patent foramen ovale suspected on color flow, LA mil/mod dilation; RV systolic pressure   Chest pain 06/09/2007   echo - EF 45-50%; interarterial shunt; mild valvular regurgitation;    Chest pain 06/09/2007   Myoview - EF 53%; normal perfusion all regions;EKG negative for ischemia    Chronic kidney disease    Diabetes mellitus    type II   DIABETES MELLITUS, TYPE II 08/29/2009   GERD (gastroesophageal reflux disease) 08/12/2010   Hemorrhoids    History of anal fissures    Hyperlipidemia    HYPERLIPIDEMIA 08/29/2009   Hypertension    HYPERTENSION 01/14/2008   Lightheadedness 05/10/2002   30d monitor unremarkable   LVH (left ventricular hypertrophy)    with systolic dysfunction by echo 10/03, EF 45-50% - Dr. Ebbie Goldmann   Multinodular goiter 12/06/2015   Nonspecific ST-T wave electrocardiographic changes 08/11/2006   Myoview - EF 49%; normal perfusion all regions, EKG negative for ischemia   OSA (obstructive sleep apnea)    Preventative health care 08/11/2010   Sleep apnea 08/13/2008   AHI 1.2; sleep study 10/2007 - AHI 9.47at 15cm H2O and 31.3 at 16cm H2O   SLEEP APNEA, OBSTRUCTIVE 01/14/2008    Past Surgical History:  Procedure Laterality Date   ANAL SPHINCTEROTOMY  08/20/11   CARDIAC CATHETERIZATION  10/23/2008   normal coronary arteries and LV function   COLONOSCOPY     IR RADIOLOGIST EVAL & MGMT  04/12/2021   IR RADIOLOGIST EVAL & MGMT  08/13/2021   IR RADIOLOGIST EVAL & MGMT  09/11/2021   IR RADIOLOGIST EVAL & MGMT  12/04/2021   IR RADIOLOGIST EVAL & MGMT  03/06/2022   POLYPECTOMY     RADIOLOGY WITH  ANESTHESIA N/A 05/15/2021   Procedure: MICROWAVE ABLATION;  Surgeon: Art Largo, MD;  Location: [facility];  Service: Radiology;  Laterality: N/A;   RADIOLOGY WITH ANESTHESIA Right 11/06/2021   Procedure: IR WITH ANESTHESIA RIGHT RENAL CRYOABLATION;  Surgeon: Art Largo, MD;  Location: [facility];  Service: Radiology;  Laterality: Right;    Allergies: Sulfa antibiotics and Vicodin [hydrocodone -acetaminophen ]  Medications: Prior to Admission medications   Medication Sig Start Date End Date Taking? Authorizing Provider  allopurinol  (ZYLOPRIM ) 100 MG tablet Take 1 tablet (100 mg total) by mouth daily. 05/28/23   Roslyn Coombe, MD  amLODipine -benazepril  (LOTREL) 10-40 MG capsule TAKE 1 CAPSULE BY MOUTH EVERY DAY 11/17/22   Macdonald Savoy, MD  colchicine  0.6 MG tablet Take 1 tablet (0.6 mg total) by mouth 2 (two) times daily. 05/28/23   Roslyn Coombe, MD  empagliflozin  (JARDIANCE ) 10 MG TABS tablet Take 1 tablet (10 mg total) by mouth daily before breakfast. 05/14/23   Roslyn Coombe, MD  hydrALAZINE  (APRESOLINE ) 50 MG tablet TAKE 1 TABLET BY MOUTH THREE TIMES A DAY 09/08/23   Roslyn Coombe, MD  metFORMIN  (GLUCOPHAGE -XR) 500 MG 24 hr tablet TAKE 2 TABLETS BY MOUTH EVERY DAY WITH BREAKFAST Patient taking differently: Take 1,000 mg by mouth daily with breakfast. 09/19/22   Roslyn Coombe, MD  methylPREDNISolone  (MEDROL  DOSEPAK) 4 MG TBPK tablet 6 day dose pack - take as directed 03/20/23  Roslyn Coombe, MD  nebivolol  (BYSTOLIC ) 10 MG tablet TAKE 2 TABLETS BY MOUTH EVERY DAY 09/08/23   Roslyn Coombe, MD  omeprazole  (PRILOSEC) 40 MG capsule TAKE 1 CAPSULE BY MOUTH EVERY DAY 09/08/23   Roslyn Coombe, MD  rosuvastatin  (CRESTOR ) 40 MG tablet TAKE 1 TABLET BY MOUTH EVERY DAY 09/08/23   Roslyn Coombe, MD  SITagliptin  (ZITUVIO ) 100 MG TABS Take 100 mg by mouth daily. 03/20/23   Roslyn Coombe, MD     Family History  Problem Relation Age of Onset   Hypertension Mother    Heart disease Father    Colon cancer Neg Hx     Thyroid  disease Neg Hx    Colon polyps Neg Hx    Esophageal cancer Neg Hx    Rectal cancer Neg Hx    Stomach cancer Neg Hx     Social History   Socioeconomic History   Marital status: Single    Spouse name: Not on file   Number of children: 1   Years of education: Not on file   Highest education level: 12th grade  Occupational History   Occupation: work as truck Air traffic controller: [employer]  Tobacco Use   Smoking status: Every Day    Current packs/day: 0.50    Average packs/day: 0.5 packs/day for 30.0 years (15.0 ttl pk-yrs)    Types: Cigarettes   Smokeless tobacco: Never  Vaping Use   Vaping status: Never Used  Substance and Sexual Activity   Alcohol use: Yes    Alcohol/week: 3.0 standard drinks of alcohol    Types: 3 Standard drinks or equivalent per week    Comment: occaisionally   Drug use: No   Sexual activity: Not on file  Other Topics Concern   Not on file  Social History Narrative   Patient lives by hisself    Social Drivers of Health   Financial Resource Strain: Low Risk  (03/19/2023)   Overall Financial Resource Strain (CARDIA)    Difficulty of Paying Living Expenses: Not hard at all  Food Insecurity: No Food Insecurity (03/19/2023)   Hunger Vital Sign    Worried About Running Out of Food in the Last Year: Never true    Ran Out of Food in the Last Year: Never true  Transportation Needs: No Transportation Needs (03/19/2023)   PRAPARE - Administrator, Civil Service (Medical): No    Lack of Transportation (Non-Medical): No  Physical Activity: Unknown (03/19/2023)   Exercise Vital Sign    Days of Exercise per Week: Patient declined    Minutes of Exercise per Session: 60 min  Stress: No Stress Concern Present (03/19/2023)   [employer] of Occupational Health - Occupational Stress Questionnaire    Feeling of Stress : Not at all  Social Connections: Unknown (03/19/2023)   Social Connection and Isolation Panel    Frequency  of Communication with Friends and Family: More than three times a week    Frequency of Social Gatherings with Friends and Family: Twice a week    Attends Religious Services: Patient declined    Database administrator or Organizations: No    Attends Engineer, structural: Not on file    Marital Status: Never married     Vital Signs: There were no vitals taken for this visit.  Physical Exam  Imaging: No results found.  Labs:  CBC: Recent Labs    11/04/22 0514 12/08/22 1601 12/15/22 0731 09/17/23 0932  WBC 6.9 6.8 9.7 6.8  HGB 10.3* 9.9* 10.1* 13.2  HCT 29.8* 30.0* 30.4* 39.2  PLT 176 216.0 261.0 217.0    COAGS: No results for input(s): INR, APTT in the last 8760 hours.  BMP: Recent Labs    11/04/22 0514 11/05/22 0453 11/06/22 0552 11/07/22 0456 11/14/22 1447 12/08/22 1601 12/15/22 0731 03/16/23 0730 09/17/23 0744  NA 138 137 138 135   < > 142 141 141 142  K 2.6* 3.3* 3.0* 3.6   < > 3.6 3.3* 3.6 3.1*  CL 106 106 108 105   < > 107 103 102 106  CO2 22 22 20* 21*   < > 27 26 30 25   GLUCOSE 100* 108* 97 104*   < > 84 109* 101* 102*  BUN 38* 28* 19 17   < > 23 33* 22 17  CALCIUM  8.7* 8.6* 8.5* 8.5*   < > 9.7 9.3 9.3 9.6  CREATININE 3.02* 2.90* 2.32* 2.25*   < > 1.98* 1.68* 1.59* 1.86*  GFRNONAA 22* 24* 31* 32*  --   --   --   --   --    < > = values in this interval not displayed.    LIVER FUNCTION TESTS: Recent Labs    11/03/22 1158 11/04/22 0514 03/16/23 0730 09/17/23 0744  BILITOT 0.6 0.6 0.3 0.6  AST 17 13* 9 20  ALT 13 12 8 15   ALKPHOS 68 55 77 112  PROT 8.1 6.3* 6.4 6.8  ALBUMIN 5.0 3.6 4.2 4.5    Assessment and Plan:  ***  Electronically Signed: Art Largo 09/18/2023, 9:51 AM   I spent a total of {Established Out-Pt:[id number]} in face to face in clinical consultation, greater than 50% of which was counseling/coordinating care for ***

## 2023-09-21 ENCOUNTER — Encounter: Payer: Self-pay | Admitting: Internal Medicine

## 2023-09-21 ENCOUNTER — Ambulatory Visit: Payer: 59 | Admitting: Internal Medicine

## 2023-09-21 VITALS — BP 120/62 | HR 60 | Temp 98.6°F | Ht 73.0 in | Wt 169.0 lb

## 2023-09-21 DIAGNOSIS — Z0000 Encounter for general adult medical examination without abnormal findings: Secondary | ICD-10-CM

## 2023-09-21 DIAGNOSIS — E538 Deficiency of other specified B group vitamins: Secondary | ICD-10-CM | POA: Diagnosis not present

## 2023-09-21 DIAGNOSIS — Z125 Encounter for screening for malignant neoplasm of prostate: Secondary | ICD-10-CM

## 2023-09-21 DIAGNOSIS — E782 Mixed hyperlipidemia: Secondary | ICD-10-CM

## 2023-09-21 DIAGNOSIS — Z794 Long term (current) use of insulin: Secondary | ICD-10-CM

## 2023-09-21 DIAGNOSIS — Z720 Tobacco use: Secondary | ICD-10-CM

## 2023-09-21 DIAGNOSIS — E0800 Diabetes mellitus due to underlying condition with hyperosmolarity without nonketotic hyperglycemic-hyperosmolar coma (NKHHC): Secondary | ICD-10-CM | POA: Diagnosis not present

## 2023-09-21 DIAGNOSIS — N1831 Chronic kidney disease, stage 3a: Secondary | ICD-10-CM

## 2023-09-21 DIAGNOSIS — Z0001 Encounter for general adult medical examination with abnormal findings: Secondary | ICD-10-CM

## 2023-09-21 DIAGNOSIS — E559 Vitamin D deficiency, unspecified: Secondary | ICD-10-CM | POA: Diagnosis not present

## 2023-09-21 DIAGNOSIS — I10 Essential (primary) hypertension: Secondary | ICD-10-CM | POA: Diagnosis not present

## 2023-09-21 LAB — MICROALBUMIN / CREATININE URINE RATIO
Creatinine,U: 212 mg/dL
Microalb Creat Ratio: 18.3 mg/g (ref 0.0–30.0)
Microalb, Ur: 3.9 mg/dL — ABNORMAL HIGH (ref 0.0–1.9)

## 2023-09-21 NOTE — Assessment & Plan Note (Signed)
 Lab Results  Component Value Date   VITAMINB12 353 09/17/2023   Stable, cont oral replacement - b12 1000 mcg qd

## 2023-09-21 NOTE — Assessment & Plan Note (Signed)
 Pt counsled to quit, pt not ready

## 2023-09-21 NOTE — Assessment & Plan Note (Addendum)
 Lab Results  Component Value Date   HGBA1C 6.2 09/17/2023   Stable, pt to continue current medical treatment jardiance  10 every day, metformin  ER 500 mg - 2 every day, sitagliptin  100 every day; for optho referral

## 2023-09-21 NOTE — Assessment & Plan Note (Signed)
 Lab Results  Component Value Date   CREATININE 1.86 (H) 09/17/2023   Stable overall, cont to avoid nephrotoxins with mild worsening, for renal referral

## 2023-09-21 NOTE — Assessment & Plan Note (Signed)
 Age and sex appropriate education and counseling updated with regular exercise and diet Referrals for preventative services - due for eye exam Immunizations addressed - none needed Smoking counseling  - pt counsled to quit, pt not ready Evidence for depression or other mood disorder - none significant Most recent labs reviewed. I have personally reviewed and have noted: 1) the patient's medical and social history 2) The patient's current medications and supplements 3) The patient's height, weight, and BMI have been recorded in the chart

## 2023-09-21 NOTE — Assessment & Plan Note (Signed)
 BP Readings from Last 3 Encounters:  09/21/23 120/62  03/20/23 128/64  12/08/22 124/76   Stable, pt to continue medical treatment lotrel 10 40 every day, hydralazine  50 tid, bystolic  10 qd

## 2023-09-21 NOTE — Progress Notes (Signed)
 Patient ID: [name], male   DOB: [birth year], 62 y.o.   MRN: [id number]         Chief Complaint:: wellness exam and dm, CKD3a, htn, smoker, low b12       HPI:  [name] is a 63 y.o. male here for wellness exam; due for eye exam referral, o/w up to date; still smoking, not ready to quit                        Also Pt denies chest pain, increased sob or doe, wheezing, orthopnea, PND, increased LE swelling, palpitations, dizziness or syncope.   Pt denies polydipsia, polyuria, or new focal neuro s/s.    Pt denies fever, wt loss, night sweats, loss of appetite, or other constitutional symptoms  Was not able to complete the UA with albs.     Wt Readings from Last 3 Encounters:  09/21/23 169 lb (76.7 kg)  03/20/23 172 lb (78 kg)  12/08/22 167 lb (75.8 kg)   BP Readings from Last 3 Encounters:  09/21/23 120/62  03/20/23 128/64  12/08/22 124/76   Immunization History  Administered Date(s) Administered   Influenza, Seasonal, Injecte, Preservative Fre 12/08/2022   Influenza,inj,Quad PF,6+ Mos 12/11/2016, 12/10/2017, 11/26/2018   Influenza-Unspecified 01/15/2015, 01/17/2020, 02/10/2022   Moderna Sars-Covid-2 Vaccination 05/30/2019, 06/30/2019   Pneumococcal Conjugate-13 09/11/2020   Pneumococcal Polysaccharide-23 11/21/2014, 12/18/2015   Td 08/29/2009   Tdap 12/06/2013   Zoster Recombinant(Shingrix ) 09/11/2020, 03/20/2022   Health Maintenance Due  Topic Date Due   OPHTHALMOLOGY EXAM  03/27/2020   Diabetic kidney evaluation - Urine ACR  09/17/2023      Past Medical History:  Diagnosis Date   Anginal pain (HCC)    Chest pain 02/13/2012   echo - EF >55%;mod concentric L ventricular hypertrophy; atrial septum aneurysmal; patent foramen ovale suspected on color flow, LA mil/mod dilation; RV systolic pressure   Chest pain 06/09/2007   echo - EF 45-50%; interarterial shunt; mild valvular regurgitation;    Chest pain 06/09/2007   Myoview - EF 53%; normal perfusion  all regions;EKG negative for ischemia    Chronic kidney disease    Diabetes mellitus    type II   DIABETES MELLITUS, TYPE II 08/29/2009   GERD (gastroesophageal reflux disease) 08/12/2010   Hemorrhoids    History of anal fissures    Hyperlipidemia    HYPERLIPIDEMIA 08/29/2009   Hypertension    HYPERTENSION 01/14/2008   Lightheadedness 05/10/2002   30d monitor unremarkable   LVH (left ventricular hypertrophy)    with systolic dysfunction by echo 10/03, EF 45-50% - Dr. Lavon   Multinodular goiter 12/06/2015   Nonspecific ST-T wave electrocardiographic changes 08/11/2006   Myoview - EF 49%; normal perfusion all regions, EKG negative for ischemia   OSA (obstructive sleep apnea)    Preventative health care 08/11/2010   Sleep apnea 08/13/2008   AHI 1.2; sleep study 10/2007 - AHI 9.47at 15cm H2O and 31.3 at 16cm H2O   SLEEP APNEA, OBSTRUCTIVE 01/14/2008   Past Surgical History:  Procedure Laterality Date   ANAL SPHINCTEROTOMY  08/20/11   CARDIAC CATHETERIZATION  10/23/2008   normal coronary arteries and LV function   COLONOSCOPY     IR RADIOLOGIST EVAL & MGMT  04/12/2021   IR RADIOLOGIST EVAL & MGMT  08/13/2021   IR RADIOLOGIST EVAL & MGMT  09/11/2021   IR RADIOLOGIST EVAL & MGMT  12/04/2021   IR RADIOLOGIST EVAL & MGMT  03/06/2022  IR RADIOLOGIST EVAL & MGMT  09/18/2023   POLYPECTOMY     RADIOLOGY WITH ANESTHESIA N/A 05/15/2021   Procedure: MICROWAVE ABLATION;  Surgeon: Hughes Simmonds, MD;  Location: [facility];  Service: Radiology;  Laterality: N/A;   RADIOLOGY WITH ANESTHESIA Right 11/06/2021   Procedure: IR WITH ANESTHESIA RIGHT RENAL CRYOABLATION;  Surgeon: Hughes Simmonds, MD;  Location: [facility];  Service: Radiology;  Laterality: Right;    reports that he has been smoking cigarettes. He has a 15 pack-year smoking history. He has never used smokeless tobacco. He reports current alcohol use of about 3.0 standard drinks of alcohol per week. He reports that he does not use drugs. family history  includes Heart disease in his father; Hypertension in his mother. Allergies  Allergen Reactions   Sulfa Antibiotics Hives and Swelling   Vicodin [Hydrocodone -Acetaminophen ] Itching and Other (See Comments)    Patient states he can take Tylenol  without difficulty.   Current Outpatient Medications on File Prior to Visit  Medication Sig Dispense Refill   methocarbamol (ROBAXIN) 750 MG tablet Take 750 mg by mouth 3 (three) times daily.     VALIUM 5 MG tablet SMARTSIG:2 Tablet(s) By Mouth     allopurinol  (ZYLOPRIM ) 100 MG tablet Take 1 tablet (100 mg total) by mouth daily. 90 tablet 3   amLODipine -benazepril  (LOTREL) 10-40 MG capsule TAKE 1 CAPSULE BY MOUTH EVERY DAY     colchicine  0.6 MG tablet Take 1 tablet (0.6 mg total) by mouth 2 (two) times daily. 180 tablet 3   empagliflozin  (JARDIANCE ) 10 MG TABS tablet Take 1 tablet (10 mg total) by mouth daily before breakfast. 30 tablet 11   hydrALAZINE  (APRESOLINE ) 50 MG tablet TAKE 1 TABLET BY MOUTH THREE TIMES A DAY 270 tablet 3   metFORMIN  (GLUCOPHAGE -XR) 500 MG 24 hr tablet TAKE 2 TABLETS BY MOUTH EVERY DAY WITH BREAKFAST (Patient taking differently: Take 1,000 mg by mouth daily with breakfast.) 180 tablet 3   methylPREDNISolone  (MEDROL  DOSEPAK) 4 MG TBPK tablet 6 day dose pack - take as directed 21 tablet 0   nebivolol  (BYSTOLIC ) 10 MG tablet TAKE 2 TABLETS BY MOUTH EVERY DAY 180 tablet 3   omeprazole  (PRILOSEC) 40 MG capsule TAKE 1 CAPSULE BY MOUTH EVERY DAY 90 capsule 3   rosuvastatin  (CRESTOR ) 40 MG tablet TAKE 1 TABLET BY MOUTH EVERY DAY 90 tablet 3   SITagliptin  (ZITUVIO ) 100 MG TABS Take 100 mg by mouth daily. 90 tablet 3   No current facility-administered medications on file prior to visit.        ROS:  All others reviewed and negative.  Objective        PE:  BP 120/62 (BP Location: Right Arm, Patient Position: Sitting, Cuff Size: Normal)   Pulse 60   Temp 98.6 F (37 C) (Oral)   Ht 6' 1 (1.854 m)   Wt 169 lb (76.7 kg)   SpO2  98%   BMI 22.30 kg/m                 Constitutional: Pt appears in NAD               HENT: Head: NCAT.                Right Ear: External ear normal.                 Left Ear: External ear normal.                Eyes: .  Pupils are equal, round, and reactive to light. Conjunctivae and EOM are normal               Nose: without d/c or deformity               Neck: Neck supple. Gross normal ROM               Cardiovascular: Normal rate and regular rhythm.                 Pulmonary/Chest: Effort normal and breath sounds without rales or wheezing.                Abd:  Soft, NT, ND, + BS, no organomegaly               Neurological: Pt is alert. At baseline orientation, motor grossly intact               Skin: Skin is warm. No rashes, no other new lesions, LE edema - none               Psychiatric: Pt behavior is normal without agitation   Micro: none  Cardiac tracings I have personally interpreted today:  none  Pertinent Radiological findings (summarize): none   Lab Results  Component Value Date   WBC 6.8 09/17/2023   HGB 13.2 09/17/2023   HCT 39.2 09/17/2023   PLT 217.0 09/17/2023   GLUCOSE 102 (H) 09/17/2023   CHOL 116 09/17/2023   TRIG 67.0 09/17/2023   HDL 41.30 09/17/2023   LDLDIRECT 117.9 01/14/2008   LDLCALC 62 09/17/2023   ALT 15 09/17/2023   AST 20 09/17/2023   NA 142 09/17/2023   K 3.1 (L) 09/17/2023   CL 106 09/17/2023   CREATININE 1.86 (H) 09/17/2023   BUN 17 09/17/2023   CO2 25 09/17/2023   TSH 1.20 09/17/2023   PSA 0.51 09/17/2023   INR 1.0 10/29/2021   HGBA1C 6.2 09/17/2023   MICROALBUR 3.6 (H) 09/17/2022   Assessment/Plan:  [name] is a 63 y.o. Black or African American [2] male with  has a past medical history of Anginal pain (HCC), Chest pain (02/13/2012), Chest pain (06/09/2007), Chest pain (06/09/2007), Chronic kidney disease, Diabetes mellitus, DIABETES MELLITUS, TYPE II (08/29/2009), GERD (gastroesophageal reflux disease) (08/12/2010),  Hemorrhoids, History of anal fissures, Hyperlipidemia, HYPERLIPIDEMIA (08/29/2009), Hypertension, HYPERTENSION (01/14/2008), Lightheadedness (05/10/2002), LVH (left ventricular hypertrophy), Multinodular goiter (12/06/2015), Nonspecific ST-T wave electrocardiographic changes (08/11/2006), OSA (obstructive sleep apnea), Preventative health care (08/11/2010), Sleep apnea (08/13/2008), and SLEEP APNEA, OBSTRUCTIVE (01/14/2008).  Encounter for well adult exam with abnormal findings Age and sex appropriate education and counseling updated with regular exercise and diet Referrals for preventative services - due for eye exam Immunizations addressed - none needed Smoking counseling  - pt counsled to quit, pt not ready Evidence for depression or other mood disorder - none significant Most recent labs reviewed. I have personally reviewed and have noted: 1) the patient's medical and social history 2) The patient's current medications and supplements 3) The patient's height, weight, and BMI have been recorded in the chart   Tobacco abuse Pt counsled to quit, pt not ready  Hyperlipidemia Lab Results  Component Value Date   LDLCALC 62 09/17/2023   Stable, pt to continue current statin crestor  40 mg qd   Essential hypertension BP Readings from Last 3 Encounters:  09/21/23 120/62  03/20/23 128/64  12/08/22 124/76   Stable, pt to continue medical treatment lotrel 10 40 every day, hydralazine   50 tid, bystolic  10 qd   Diabetes (HCC) Lab Results  Component Value Date   HGBA1C 6.2 09/17/2023   Stable, pt to continue current medical treatment jardiance  10 every day, metformin  ER 500 mg - 2 every day, sitagliptin  100 every day; for optho referral   CKD (chronic kidney disease) stage 3, GFR 30-59 ml/min (HCC) Lab Results  Component Value Date   CREATININE 1.86 (H) 09/17/2023   Stable overall, cont to avoid nephrotoxins with mild worsening, for renal referral  B12 deficiency Lab Results   Component Value Date   VITAMINB12 353 09/17/2023   Stable, cont oral replacement - b12 1000 mcg qd  Followup: Return in about 6 months (around 03/22/2024).  Lynwood Rush, MD 09/21/2023 8:20 PM [facility]

## 2023-09-21 NOTE — Assessment & Plan Note (Signed)
 Lab Results  Component Value Date   LDLCALC 62 09/17/2023   Stable, pt to continue current statin crestor  40 mg qd

## 2023-09-21 NOTE — Patient Instructions (Signed)
 Please continue all other medications as before, and refills have been done if requested.  Please have the pharmacy call with any other refills you may need.  Please continue your efforts at being more active, low cholesterol diet, and weight control.  You are otherwise up to date with prevention measures today.  Please keep your appointments with your specialists as you may have planned - urology  You will be contacted regarding the referral for: eye doctor and Renal  Please go to the LAB at the blood drawing area for the tests to be done - just the urine testing today  You will be contacted by phone if any changes need to be made immediately.  Otherwise, you will receive a letter about your results with an explanation, but please check with MyChart first.  Please make an Appointment to return in 6 months, or sooner if needed

## 2023-09-22 LAB — URINALYSIS, ROUTINE W REFLEX MICROSCOPIC
Bilirubin Urine: NEGATIVE
Hgb urine dipstick: NEGATIVE
Ketones, ur: NEGATIVE
Leukocytes,Ua: NEGATIVE
Nitrite: NEGATIVE
RBC / HPF: NONE SEEN (ref 0–?)
Specific Gravity, Urine: 1.02 (ref 1.000–1.030)
Urine Glucose: >=1000 — AB
Urobilinogen, UA: 0.2 (ref 0.0–1.0)
pH: 6 (ref 5.0–8.0)

## 2023-10-16 NOTE — Addendum Note (Signed)
 Encounter addended by: Jarold Nest on: 10/16/2023 11:02 AM  Actions taken: Imaging Exam ended

## 2023-10-19 NOTE — Addendum Note (Signed)
 Encounter addended by: Hughes Simmonds, MD on: 10/19/2023 8:21 AM  Actions taken: Pend clinical note, Clinical Note Signed

## 2023-10-19 NOTE — Progress Notes (Signed)
 Visit Reason: Imaging follow up s/p R renal mass ablation   Care Team: Selma Donnice SAUNDERS MD; Urology Norleen Lynwood ORN, MD; PCP   Virtual Visit via Video Conferencing  I connected with [name] on 09/18/23 by video-telephonic conference and verified that I am speaking with the correct person using two identifiers. I discussed the limitations, risks, security and privacy concerns of performing an evaluation and management service by tele-visit and the availability of in-person appointments.   History of Present Illness:   [name] is a 63 y.o. male well known to me s/p prior ablation of L renal mass c/w papillary RCC. Pt had originally presented to ER w fatigue in 01/2021 and was found to have an AKI. Diagnostic workup included a renal US  which identified an incidental 2.1 cm R renal cystic lesion for which followup was recommended with MRI, and deemed Bosniak IIF (02/2021). An incidental, contralateral 1 cm left upper pole mass had imaging characteristics consistent with aforementioned papillary RCC. He underwent successful and uneventful L renal mass microwave ablation by me on 05/15/21. On imaging follow up, the R renal mass was upgraded ofrom Bosniak IIF to III. He is followed closely by Urology, Dr Selma, and was re-referred to me for a minimally invasive treatment option of this lesion. He underwent biopsy and cryoablation of complex cystic R renal mass on 11/06/21. Procedure was minimally complicated by post op hematoma, managed conservatively. Pt was discharged on POD 1 without event. 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   Pt was last seen in 6 month imaging follow up in 03/06/22 and was back to baseline. His initial post ablation scan, and additional scans requested by his Urologist (MR Abd 11/04/22 and most recently 07/11/23) were negative for evidence of residual disease. He denies any discomfort, hematuria or other localizing symptoms.    Review of Systems: A 12-point  ROS discussed, and pertinent positives are indicated in the HPI above.  All other systems are negative.    Past Medical History:  Diagnosis Date   Anginal pain (HCC)    Chest pain 02/13/2012   echo - EF >55%;mod concentric L ventricular hypertrophy; atrial septum aneurysmal; patent foramen ovale suspected on color flow, LA mil/mod dilation; RV systolic pressure   Chest pain 06/09/2007   echo - EF 45-50%; interarterial shunt; mild valvular regurgitation;    Chest pain 06/09/2007   Myoview - EF 53%; normal perfusion all regions;EKG negative for ischemia    Chronic kidney disease    Diabetes mellitus    type II   DIABETES MELLITUS, TYPE II 08/29/2009   GERD (gastroesophageal reflux disease) 08/12/2010   Hemorrhoids    History of anal fissures    Hyperlipidemia    HYPERLIPIDEMIA 08/29/2009   Hypertension    HYPERTENSION 01/14/2008   Lightheadedness 05/10/2002   30d monitor unremarkable   LVH (left ventricular hypertrophy)    with systolic dysfunction by echo 10/03, EF 45-50% - Dr. Lavon   Multinodular goiter 12/06/2015   Nonspecific ST-T wave electrocardiographic changes 08/11/2006   Myoview - EF 49%; normal perfusion all regions, EKG negative for ischemia   OSA (obstructive sleep apnea)    Preventative health care 08/11/2010   Sleep apnea 08/13/2008   AHI 1.2; sleep study 10/2007 - AHI 9.47at 15cm H2O and 31.3 at 16cm H2O   SLEEP APNEA, OBSTRUCTIVE 01/14/2008    Past Surgical History:  Procedure Laterality Date   ANAL SPHINCTEROTOMY  08/20/11   CARDIAC CATHETERIZATION  10/23/2008  normal coronary arteries and LV function   COLONOSCOPY     IR RADIOLOGIST EVAL & MGMT  04/12/2021   IR RADIOLOGIST EVAL & MGMT  08/13/2021   IR RADIOLOGIST EVAL & MGMT  09/11/2021   IR RADIOLOGIST EVAL & MGMT  12/04/2021   IR RADIOLOGIST EVAL & MGMT  03/06/2022   IR RADIOLOGIST EVAL & MGMT  09/18/2023   POLYPECTOMY     RADIOLOGY WITH ANESTHESIA N/A 05/15/2021   Procedure: MICROWAVE ABLATION;   Surgeon: Hughes Simmonds, MD;  Location: [facility];  Service: Radiology;  Laterality: N/A;   RADIOLOGY WITH ANESTHESIA Right 11/06/2021   Procedure: IR WITH ANESTHESIA RIGHT RENAL CRYOABLATION;  Surgeon: Hughes Simmonds, MD;  Location: [facility];  Service: Radiology;  Laterality: Right;    Allergies: Sulfa antibiotics and Vicodin [hydrocodone -acetaminophen ]  Medications: Prior to Admission medications   Medication Sig Start Date End Date Taking? Authorizing Provider  allopurinol  (ZYLOPRIM ) 100 MG tablet Take 1 tablet (100 mg total) by mouth daily. 05/28/23   Norleen Lynwood ORN, MD  amLODipine -benazepril  (LOTREL) 10-40 MG capsule TAKE 1 CAPSULE BY MOUTH EVERY DAY 11/17/22   Odell Celinda Balo, MD  colchicine  0.6 MG tablet Take 1 tablet (0.6 mg total) by mouth 2 (two) times daily. 05/28/23   Norleen Lynwood ORN, MD  empagliflozin  (JARDIANCE ) 10 MG TABS tablet Take 1 tablet (10 mg total) by mouth daily before breakfast. 05/14/23   Norleen Lynwood ORN, MD  hydrALAZINE  (APRESOLINE ) 50 MG tablet TAKE 1 TABLET BY MOUTH THREE TIMES A DAY 09/08/23   Norleen Lynwood ORN, MD  metFORMIN  (GLUCOPHAGE -XR) 500 MG 24 hr tablet TAKE 2 TABLETS BY MOUTH EVERY DAY WITH BREAKFAST Patient taking differently: Take 1,000 mg by mouth daily with breakfast. 09/19/22   Norleen Lynwood ORN, MD  methocarbamol (ROBAXIN) 750 MG tablet Take 750 mg by mouth 3 (three) times daily. 06/12/23   [provider]  methylPREDNISolone  (MEDROL  DOSEPAK) 4 MG TBPK tablet 6 day dose pack - take as directed 03/20/23   Norleen Lynwood ORN, MD  nebivolol  (BYSTOLIC ) 10 MG tablet TAKE 2 TABLETS BY MOUTH EVERY DAY 09/08/23   Norleen Lynwood ORN, MD  omeprazole  (PRILOSEC) 40 MG capsule TAKE 1 CAPSULE BY MOUTH EVERY DAY 09/08/23   Norleen Lynwood ORN, MD  rosuvastatin  (CRESTOR ) 40 MG tablet TAKE 1 TABLET BY MOUTH EVERY DAY 09/08/23   Norleen Lynwood ORN, MD  SITagliptin  (ZITUVIO ) 100 MG TABS Take 100 mg by mouth daily. 03/20/23   Norleen Lynwood ORN, MD  VALIUM 5 MG tablet SMARTSIG:2 Tablet(s) By Mouth 07/09/23    [provider]     Family History  Problem Relation Age of Onset   Hypertension Mother    Heart disease Father    Colon cancer Neg Hx    Thyroid  disease Neg Hx    Colon polyps Neg Hx    Esophageal cancer Neg Hx    Rectal cancer Neg Hx    Stomach cancer Neg Hx     Social History   Socioeconomic History   Marital status: Single    Spouse name: Not on file   Number of children: 1   Years of education: Not on file   Highest education level: 12th grade  Occupational History   Occupation: work as truck Air traffic controller: [employer]  Tobacco Use   Smoking status: Every Day    Current packs/day: 0.50    Average packs/day: 0.5 packs/day for 30.0 years (15.0 ttl pk-yrs)    Types: Cigarettes  Smokeless tobacco: Never  Vaping Use   Vaping status: Never Used  Substance and Sexual Activity   Alcohol use: Yes    Alcohol/week: 3.0 standard drinks of alcohol    Types: 3 Standard drinks or equivalent per week    Comment: occaisionally   Drug use: No   Sexual activity: Not on file  Other Topics Concern   Not on file  Social History Narrative   Patient lives by hisself    Social Drivers of Health   Financial Resource Strain: Low Risk  (03/19/2023)   Overall Financial Resource Strain (CARDIA)    Difficulty of Paying Living Expenses: Not hard at all  Food Insecurity: No Food Insecurity (03/19/2023)   Hunger Vital Sign    Worried About Running Out of Food in the Last Year: Never true    Ran Out of Food in the Last Year: Never true  Transportation Needs: No Transportation Needs (03/19/2023)   PRAPARE - Administrator, Civil Service (Medical): No    Lack of Transportation (Non-Medical): No  Physical Activity: Unknown (03/19/2023)   Exercise Vital Sign    Days of Exercise per Week: Patient declined    Minutes of Exercise per Session: 60 min  Stress: No Stress Concern Present (03/19/2023)   [employer] of Occupational Health - Occupational  Stress Questionnaire    Feeling of Stress : Not at all  Social Connections: Unknown (03/19/2023)   Social Connection and Isolation Panel    Frequency of Communication with Friends and Family: More than three times a week    Frequency of Social Gatherings with Friends and Family: Twice a week    Attends Religious Services: Patient declined    Database administrator or Organizations: No    Attends Engineer, structural: Not on file    Marital Status: Never married     Vital Signs: There were no vitals taken for this visit.  Physical Exam Deferred secondary to virtual visit  Imaging: No results found.  IR R renal mass cryoablation: 11/06/21 Adequate R complex cystic renal mass targeting and ablation.   Follow up MR Abdomen, 07/11/23 Ablation sites of the posterior superior pole of the left kidney and anterolateral inferior pole of the right kidney. No suspicious soft tissue or contrast enhancement to suggest local recurrence.      Labs:  CBC: Recent Labs    11/04/22 0514 12/08/22 1601 12/15/22 0731 09/17/23 0744  WBC 6.9 6.8 9.7 6.8  HGB 10.3* 9.9* 10.1* 13.2  HCT 29.8* 30.0* 30.4* 39.2  PLT 176 216.0 261.0 217.0    COAGS: No results for input(s): INR, APTT in the last 8760 hours.  BMP: Recent Labs    11/04/22 0514 11/05/22 0453 11/06/22 0552 11/07/22 0456 11/14/22 1447 12/08/22 1601 12/15/22 0731 03/16/23 0730 09/17/23 0744  NA 138 137 138 135   < > 142 141 141 142  K 2.6* 3.3* 3.0* 3.6   < > 3.6 3.3* 3.6 3.1*  CL 106 106 108 105   < > 107 103 102 106  CO2 22 22 20* 21*   < > 27 26 30 25   GLUCOSE 100* 108* 97 104*   < > 84 109* 101* 102*  BUN 38* 28* 19 17   < > 23 33* 22 17  CALCIUM  8.7* 8.6* 8.5* 8.5*   < > 9.7 9.3 9.3 9.6  CREATININE 3.02* 2.90* 2.32* 2.25*   < > 1.98* 1.68* 1.59* 1.86*  GFRNONAA 22* 24* 31*  32*  --   --   --   --   --    < > = values in this interval not displayed.     Assessment and Plan:  63 y/o M well known to  me s/p MWA of a small L renal pRCC on 05/15/21 and cryoablation of complex cystic R renal mass on 11/06/21.   Pathology confirmatory for R ccRCC.  Post ablation change without evidence of residual or recurrent disease.   *Missed appts and non-standard imaging interval. Pt is currently < 73yrs since most current ablation *30 mos post ablation (11/06/21) follow up imaging w multiphasic MR for final evaluation. *Will see him back in virtual clinic for imaging review. *Pt will also follow up with his Urologist. *Comorbidity follow up with PCP.     Thank you for allowing us  to participate in the care of this Patient.  Electronically Signed:  Thom Hall, MD [facility]   Pager. [phone] Clinic. [phone]  As part of this video-telephonic encounter, no in-person exam was conducted. The patient was physically located in [place]  or a state in which I am permitted to provide care. The encounter was reasonable and appropriate under the circumstances given the patient's presentation at the time.  I spent a total of 30 Minutes of non-face-to-face time in clinical consultation, greater than 50% of which was counseling/coordinating care for [name]'s evaluation for renal mass treatment.

## 2023-11-17 ENCOUNTER — Encounter: Payer: Self-pay | Admitting: Urology

## 2023-11-17 ENCOUNTER — Other Ambulatory Visit: Payer: Self-pay | Admitting: Urology

## 2023-11-17 DIAGNOSIS — D49512 Neoplasm of unspecified behavior of left kidney: Secondary | ICD-10-CM

## 2023-11-17 DIAGNOSIS — D49511 Neoplasm of unspecified behavior of right kidney: Secondary | ICD-10-CM

## 2023-12-10 ENCOUNTER — Other Ambulatory Visit: Payer: Self-pay | Admitting: Internal Medicine

## 2024-01-09 ENCOUNTER — Ambulatory Visit
Admission: RE | Admit: 2024-01-09 | Discharge: 2024-01-09 | Disposition: A | Discharge status: Home / Self Care | Source: Ambulatory Visit | Attending: Urology | Admitting: Urology

## 2024-01-09 DIAGNOSIS — D49512 Neoplasm of unspecified behavior of left kidney: Secondary | ICD-10-CM

## 2024-01-09 DIAGNOSIS — D49511 Neoplasm of unspecified behavior of right kidney: Secondary | ICD-10-CM

## 2024-01-09 MED ORDER — GADOPICLENOL 0.5 MMOL/ML IV SOLN
8.0000 mL | Freq: Once | INTRAVENOUS | Status: AC | PRN
  Administered 2024-01-09: 8 mL via INTRAVENOUS

## 2024-01-14 ENCOUNTER — Ambulatory Visit
Admission: EM | Admit: 2024-01-14 | Discharge: 2024-01-14 | Disposition: A | Discharge status: Home / Self Care | Attending: Family Medicine | Admitting: Family Medicine

## 2024-01-14 ENCOUNTER — Encounter: Payer: Self-pay | Admitting: Emergency Medicine

## 2024-01-14 ENCOUNTER — Ambulatory Visit (INDEPENDENT_AMBULATORY_CARE_PROVIDER_SITE_OTHER)

## 2024-01-14 DIAGNOSIS — J069 Acute upper respiratory infection, unspecified: Secondary | ICD-10-CM

## 2024-01-14 DIAGNOSIS — R0989 Other specified symptoms and signs involving the circulatory and respiratory systems: Secondary | ICD-10-CM

## 2024-01-14 DIAGNOSIS — J441 Chronic obstructive pulmonary disease with (acute) exacerbation: Secondary | ICD-10-CM | POA: Diagnosis not present

## 2024-01-14 DIAGNOSIS — R9389 Abnormal findings on diagnostic imaging of other specified body structures: Secondary | ICD-10-CM | POA: Diagnosis not present

## 2024-01-14 LAB — POC COVID19/FLU A&B COMBO
Covid Antigen, POC: NEGATIVE
Influenza A Antigen, POC: NEGATIVE
Influenza B Antigen, POC: NEGATIVE

## 2024-01-14 MED ORDER — PREDNISONE 20 MG PO TABS: 40.0000 mg | ORAL_TABLET | Freq: Every day | ORAL | 0 refills | Status: AC

## 2024-01-14 MED ORDER — ALBUTEROL SULFATE HFA 108 (90 BASE) MCG/ACT IN AERS: 2.0000 | INHALATION_SPRAY | RESPIRATORY_TRACT | 0 refills | Status: AC | PRN

## 2024-01-14 MED ORDER — BENZONATATE 100 MG PO CAPS: 100.0000 mg | ORAL_CAPSULE | Freq: Three times a day (TID) | ORAL | 0 refills | Status: AC | PRN

## 2024-01-14 NOTE — Discharge Instructions (Addendum)
 The COVID and flu test was negative.  The chest x-ray did not show any pneumonia or fluid. There was an area at the top of your right lung that they thought might be an abnormally shaped blood vessel.  They recommend follow-up with maybe a CAT scan.  Please see your primary care about these issues.  Albuterol  inhaler--do 2 puffs every 4 hours as needed for shortness of breath or wheezing  Take prednisone  20 mg--2 daily for 5 days; this medication can make your sugars go higher.  Drink plenty of fluids and be extra good on your diet for the next few days.  Take benzonatate 100 mg, 1 tab every 8 hours as needed for cough.  I think Mucinex or Robitussin over-the-counter can also help the congestion by making it thinner so you can get it out easier.

## 2024-01-14 NOTE — ED Provider Notes (Signed)
 [facility]    CSN: [id number] Arrival date & time: 01/14/24  0912      History   Chief Complaint Chief Complaint  Patient presents with   Cough   Nasal Congestion    HPI [name] is a 63 y.o. male.    Cough Here for nasal congestion and cough and chest congestion.  He has felt tight in his chest some.  Symptoms began on October 14.  He has had some chills but states he has not noted fever at home.  Temperature here was 99.2.  His girlfriend was sick with similar symptoms last week and all testing was negative.  He is allergic to Vicodin and sulfa.  He does have a history of diabetes, that he states is well-controlled.  He also has a history of CKD with last eGFR in the 30s.  He denies a history of asthma or COPD  He does smoke cigarettes  Past Medical History:  Diagnosis Date   Anginal pain    Chest pain 02/13/2012   echo - EF >55%;mod concentric L ventricular hypertrophy; atrial septum aneurysmal; patent foramen ovale suspected on color flow, LA mil/mod dilation; RV systolic pressure   Chest pain 06/09/2007   echo - EF 45-50%; interarterial shunt; mild valvular regurgitation;    Chest pain 06/09/2007   Myoview - EF 53%; normal perfusion all regions;EKG negative for ischemia    Chronic kidney disease    Diabetes mellitus    type II   DIABETES MELLITUS, TYPE II 08/29/2009   GERD (gastroesophageal reflux disease) 08/12/2010   Hemorrhoids    History of anal fissures    Hyperlipidemia    HYPERLIPIDEMIA 08/29/2009   Hypertension    HYPERTENSION 01/14/2008   Lightheadedness 05/10/2002   30d monitor unremarkable   LVH (left ventricular hypertrophy)    with systolic dysfunction by echo 10/03, EF 45-50% - Dr. Lavon   Multinodular goiter 12/06/2015   Nonspecific ST-T wave electrocardiographic changes 08/11/2006   Myoview - EF 49%; normal perfusion all regions, EKG negative for ischemia   OSA (obstructive sleep apnea)    Preventative  health care 08/11/2010   Sleep apnea 08/13/2008   AHI 1.2; sleep study 10/2007 - AHI 9.47at 15cm H2O and 31.3 at 16cm H2O   SLEEP APNEA, OBSTRUCTIVE 01/14/2008    Patient Active Problem List   Diagnosis Date Noted   Anemia 12/08/2022   Near syncope 11/05/2022   Hypomagnesemia 11/05/2022   Acute kidney injury (AKI) with acute tubular necrosis (ATN) 11/04/2022   Nocturia 11/03/2022   Intractable acute post-traumatic headache 11/03/2022   Acute renal failure superimposed on chronic kidney disease 11/03/2022   CKD (chronic kidney disease) stage 3, GFR 30-59 ml/min (HCC) 09/19/2022   B12 deficiency 09/19/2022   Hypokalemia 09/19/2022   Screen for colon cancer 03/21/2022   History of colon polyps 03/21/2022   Right renal mass 11/06/2021   Renal cancer, right (HCC) 11/06/2021   Hyperthyroidism 06/19/2021   Left renal mass 05/15/2021   Encounter for ablation of neoplasm of liver 05/15/2021   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   Hallux limitus of left foot 07/17/2020   Capsulitis 09/13/2019   Acute gouty arthritis 08/02/2019   Dysphagia 12/20/2018   Bursitis of right elbow 09/14/2018   Encounter for well adult exam with abnormal findings 12/10/2017   Multinodular goiter 12/06/2015   Loss of weight 11/21/2014   Tobacco abuse 08/16/2012   Anal fissure 06/19/2011   Hemorrhoids 06/12/2011  GERD (gastroesophageal reflux disease) 08/12/2010   Diabetes (HCC) 08/29/2009   Hyperlipidemia 08/29/2009   SLEEP APNEA, OBSTRUCTIVE 01/14/2008   Essential hypertension 01/14/2008    Past Surgical History:  Procedure Laterality Date   ANAL SPHINCTEROTOMY  08/20/11   CARDIAC CATHETERIZATION  10/23/2008   normal coronary arteries and LV function   COLONOSCOPY     IR RADIOLOGIST EVAL & MGMT  04/12/2021   IR RADIOLOGIST EVAL & MGMT  08/13/2021   IR RADIOLOGIST EVAL & MGMT  09/11/2021   IR RADIOLOGIST EVAL & MGMT  12/04/2021   IR RADIOLOGIST EVAL & MGMT  03/06/2022   IR  RADIOLOGIST EVAL & MGMT  09/18/2023   POLYPECTOMY     RADIOLOGY WITH ANESTHESIA N/A 05/15/2021   Procedure: MICROWAVE ABLATION;  Surgeon: Hughes Simmonds, MD;  Location: [facility];  Service: Radiology;  Laterality: N/A;   RADIOLOGY WITH ANESTHESIA Right 11/06/2021   Procedure: IR WITH ANESTHESIA RIGHT RENAL CRYOABLATION;  Surgeon: Hughes Simmonds, MD;  Location: [facility];  Service: Radiology;  Laterality: Right;       Home Medications    Prior to Admission medications   Medication Sig Start Date End Date Taking? Authorizing Provider  albuterol  (VENTOLIN  HFA) 108 (90 Base) MCG/ACT inhaler Inhale 2 puffs into the lungs every 4 (four) hours as needed for wheezing or shortness of breath. 01/14/24  Yes Vonna Sharlet POUR, MD  allopurinol  (ZYLOPRIM ) 100 MG tablet Take 1 tablet (100 mg total) by mouth daily. 05/28/23  Yes Norleen Lynwood ORN, MD  amLODipine -benazepril  (LOTREL) 10-40 MG capsule TAKE 1 CAPSULE BY MOUTH EVERY DAY 11/17/22  Yes Odell Celinda Balo, MD  benzonatate (TESSALON) 100 MG capsule Take 1 capsule (100 mg total) by mouth 3 (three) times daily as needed for cough. 01/14/24  Yes Vonna Sharlet POUR, MD  colchicine  0.6 MG tablet Take 1 tablet (0.6 mg total) by mouth 2 (two) times daily. 05/28/23  Yes Norleen Lynwood ORN, MD  empagliflozin  (JARDIANCE ) 10 MG TABS tablet Take 1 tablet (10 mg total) by mouth daily before breakfast. 05/14/23  Yes Norleen Lynwood ORN, MD  hydrALAZINE  (APRESOLINE ) 50 MG tablet TAKE 1 TABLET BY MOUTH THREE TIMES A DAY 09/08/23  Yes Norleen Lynwood ORN, MD  nebivolol  (BYSTOLIC ) 10 MG tablet TAKE 2 TABLETS BY MOUTH EVERY DAY 09/08/23  Yes Norleen Lynwood ORN, MD  omeprazole  (PRILOSEC) 40 MG capsule TAKE 1 CAPSULE BY MOUTH EVERY DAY 09/08/23  Yes Norleen Lynwood ORN, MD  predniSONE  (DELTASONE ) 20 MG tablet Take 2 tablets (40 mg total) by mouth daily with breakfast for 5 days. 01/14/24 01/19/24 Yes Vonna Sharlet POUR, MD  rosuvastatin  (CRESTOR ) 40 MG tablet TAKE 1 TABLET BY MOUTH EVERY DAY 09/08/23  Yes Norleen Lynwood ORN,  MD  SITagliptin  (ZITUVIO ) 100 MG TABS Take 100 mg by mouth daily. 03/20/23  Yes Norleen Lynwood ORN, MD  VALIUM 5 MG tablet SMARTSIG:2 Tablet(s) By Mouth Patient not taking: Reported on 01/14/2024 07/09/23   [provider]    Family History Family History  Problem Relation Age of Onset   Hypertension Mother    Heart disease Father    Colon cancer Neg Hx    Thyroid  disease Neg Hx    Colon polyps Neg Hx    Esophageal cancer Neg Hx    Rectal cancer Neg Hx    Stomach cancer Neg Hx     Social History Social History   Tobacco Use   Smoking status: Every Day    Current packs/day: 0.50  Average packs/day: 0.5 packs/day for 30.0 years (15.0 ttl pk-yrs)    Types: Cigarettes    Passive exposure: Current   Smokeless tobacco: Never  Vaping Use   Vaping status: Never Used  Substance Use Topics   Alcohol use: Yes    Alcohol/week: 3.0 standard drinks of alcohol    Types: 3 Standard drinks or equivalent per week    Comment: social   Drug use: No     Allergies   Sulfa antibiotics and Vicodin [hydrocodone -acetaminophen ]   Review of Systems Review of Systems  Respiratory:  Positive for cough.      Physical Exam Triage Vital Signs ED Triage Vitals [01/14/24 0953]  Encounter Vitals Group     BP 125/79     Girls Systolic BP Percentile      Girls Diastolic BP Percentile      Boys Systolic BP Percentile      Boys Diastolic BP Percentile      Pulse Rate 66     Resp 16     Temp 99.2 F (37.3 C)     Temp Source Oral     SpO2 96 %     Weight      Height      Head Circumference      Peak Flow      Pain Score 0     Pain Loc      Pain Education      Exclude from Growth Chart    No data found.  Updated Vital Signs BP 125/79 (BP Location: Left Arm)   Pulse 66   Temp 99.2 F (37.3 C) (Oral)   Resp 16   SpO2 96%   Visual Acuity Right Eye Distance:   Left Eye Distance:   Bilateral Distance:    Right Eye Near:   Left Eye Near:    Bilateral Near:      Physical Exam Vitals reviewed.  Constitutional:      General: He is not in acute distress.    Appearance: He is not toxic-appearing.  HENT:     Right Ear: Tympanic membrane and ear canal normal.     Left Ear: Tympanic membrane and ear canal normal.     Nose: Congestion present.     Mouth/Throat:     Mouth: Mucous membranes are moist.     Comments: There is copious clear exudate draining in the oropharynx.  No erythema or asymmetry Eyes:     Extraocular Movements: Extraocular movements intact.     Conjunctiva/sclera: Conjunctivae normal.     Pupils: Pupils are equal, round, and reactive to light.  Cardiovascular:     Rate and Rhythm: Normal rate and regular rhythm.     Heart sounds: No murmur heard. Pulmonary:     Effort: No respiratory distress.     Breath sounds: No stridor. No rhonchi or rales.     Comments: No wheezing on exam.  Breath sounds are coarse Musculoskeletal:     Cervical back: Neck supple.  Lymphadenopathy:     Cervical: No cervical adenopathy.  Skin:    Capillary Refill: Capillary refill takes less than 2 seconds.     Coloration: Skin is not jaundiced or pale.  Neurological:     General: No focal deficit present.     Mental Status: He is alert and oriented to person, place, and time.  Psychiatric:        Behavior: Behavior normal.      UC Treatments / Results  Labs (all  labs ordered are listed, but only abnormal results are displayed) Labs Reviewed  POC COVID19/FLU A&B COMBO - Normal    EKG   Radiology DG Chest 2 View Result Date: 01/14/2024 CLINICAL DATA:  Cough and chest congestion. EXAM: CHEST - 2 VIEW COMPARISON:  11/06/2021 FINDINGS: The lungs are clear without focal pneumonia, edema, pneumothorax or pleural effusion. New right paratracheal opacity is probably related to ectasia of aortic arch branch vessel anatomy. Linear densities in the parahilar left mid lung are stable compatible chronic atelectasis or scarring. The cardiopericardial  silhouette is within normal limits for size. No acute bony abnormality. IMPRESSION: 1. No acute cardiopulmonary findings. 2. New right paratracheal opacity is probably related to ectasia of aortic arch branch vessel anatomy. CT chest recommended to confirm. Electronically Signed   By: Camellia Candle M.D.   On: 01/14/2024 10:58    Procedures Procedures (including critical care time)  Medications Ordered in UC Medications - No data to display  Initial Impression / Assessment and Plan / UC Course  I have reviewed the triage vital signs and the nursing notes.  Pertinent labs & imaging results that were available during my care of the patient were reviewed by me and considered in my medical decision making (see chart for details).     COVID and flu test is negative Chest x-ray is read is clear, though there is an area that could be aortic branch vessel ectasia and CT follow-up is recommended.  Albuterol  and 5 days of prednisone  are sent in for what might be a COPD exacerbation though never diagnosed.  Tessalon Perles are also sent in for the cough and have asked him to take Mucinex over-the-counter.  I have asked him to follow-up with his primary care for follow-up on the abnormality on the x-ray Final Clinical Impressions(s) / UC Diagnoses   Final diagnoses:  Chest congestion  Viral URI  COPD exacerbation (HCC)  Abnormal chest x-ray     Discharge Instructions      The COVID and flu test was negative.  The chest x-ray did not show any pneumonia or fluid. There was an area at the top of your right lung that they thought might be an abnormally shaped blood vessel.  They recommend follow-up with maybe a CAT scan.  Please see your primary care about these issues.  Albuterol  inhaler--do 2 puffs every 4 hours as needed for shortness of breath or wheezing  Take prednisone  20 mg--2 daily for 5 days; this medication can make your sugars go higher.  Drink plenty of fluids and be extra  good on your diet for the next few days.  Take benzonatate 100 mg, 1 tab every 8 hours as needed for cough.  I think Mucinex or Robitussin over-the-counter can also help the congestion by making it thinner so you can get it out easier.     ED Prescriptions     Medication Sig Dispense Auth. Provider   albuterol  (VENTOLIN  HFA) 108 (90 Base) MCG/ACT inhaler Inhale 2 puffs into the lungs every 4 (four) hours as needed for wheezing or shortness of breath. 1 each Vonna Sharlet POUR, MD   predniSONE  (DELTASONE ) 20 MG tablet Take 2 tablets (40 mg total) by mouth daily with breakfast for 5 days. 10 tablet Alexy Heldt K, MD   benzonatate (TESSALON) 100 MG capsule Take 1 capsule (100 mg total) by mouth 3 (three) times daily as needed for cough. 21 capsule Ayme Short K, MD      PDMP not  reviewed this encounter.   Vonna Sharlet POUR, MD 01/14/24 [phone]

## 2024-01-14 NOTE — ED Triage Notes (Signed)
 Pt reports productive cough, chest congestion, nasal congestion, fevers, and chills x3 days. Pt reports gf was sick with similar symptoms last week - seen at [facility] and tested negative for everything. Pt has been taking coricidin with little relief. Unknown max temps, but pt reports being very warm.

## 2024-01-23 ENCOUNTER — Ambulatory Visit: Admission: EM | Admit: 2024-01-23 | Discharge: 2024-01-23 | Disposition: A | Discharge status: Home / Self Care

## 2024-01-23 DIAGNOSIS — G9331 Postviral fatigue syndrome: Secondary | ICD-10-CM

## 2024-01-23 MED ORDER — AZELASTINE HCL 0.1 % NA SOLN: 1.0000 | Freq: Two times a day (BID) | NASAL | 1 refills | Status: AC

## 2024-01-23 MED ORDER — PROMETHAZINE-DM 6.25-15 MG/5ML PO SYRP: 10.0000 mL | ORAL_SOLUTION | Freq: Three times a day (TID) | ORAL | 0 refills | Status: DC | PRN

## 2024-01-23 NOTE — ED Triage Notes (Signed)
 Patient reports being seen on 01-14-2024 for Cough, Congestion, Fever, Chills. Treated for COPD Exacerbation and Virus. He states he is done with the medicine given except inhaler he is still using PRN. Most concerned about the continuous Cough and it being no better. Congestion in nose & Fever seems to be improved.

## 2024-01-23 NOTE — ED Provider Notes (Signed)
 [facility]  Note:  This document was prepared using Conservation officer, historic buildings and may include unintentional dictation errors.  MRN: [id number] DOB: February 03, 1961  Subjective:   [name] is a 63 y.o. male presenting for continued cough, congestion, chills x 10 days.  Patient reports that he was seen here on 01/14/2024 and treated for COPD exacerbation and viral upper respiratory infection.  Patient was put on an albuterol  inhaler, benzonatate, and prednisone  with improvement to nasal congestion and fever but still having persistent cough, usually worse at night.  Patient has been using prescribed benzonatate with minimal improvement to nighttime cough.  Patient denies shortness of breath, chest pain, weakness, dizziness.  No current facility-administered medications for this encounter.  Current Outpatient Medications:    albuterol  (VENTOLIN  HFA) 108 (90 Base) MCG/ACT inhaler, Inhale 2 puffs into the lungs every 4 (four) hours as needed for wheezing or shortness of breath., Disp: 1 each, Rfl: 0   allopurinol  (ZYLOPRIM ) 100 MG tablet, Take 1 tablet (100 mg total) by mouth daily., Disp: 90 tablet, Rfl: 3   amLODipine -benazepril  (LOTREL) 10-40 MG capsule, TAKE 1 CAPSULE BY MOUTH EVERY DAY, Disp: , Rfl:    azelastine (ASTELIN) 0.1 % nasal spray, Place 1 spray into both nostrils 2 (two) times daily. Use in each nostril as directed, Disp: 30 mL, Rfl: 1   benzonatate (TESSALON) 100 MG capsule, Take 1 capsule (100 mg total) by mouth 3 (three) times daily as needed for cough., Disp: 21 capsule, Rfl: 0   colchicine  0.6 MG tablet, Take 1 tablet (0.6 mg total) by mouth 2 (two) times daily., Disp: 180 tablet, Rfl: 3   empagliflozin  (JARDIANCE ) 10 MG TABS tablet, Take 1 tablet (10 mg total) by mouth daily before breakfast., Disp: 30 tablet, Rfl: 11   hydrALAZINE  (APRESOLINE ) 50 MG tablet, TAKE 1 TABLET BY MOUTH THREE TIMES A DAY, Disp: 270 tablet, Rfl: 3   nebivolol  (BYSTOLIC ) 10 MG  tablet, TAKE 2 TABLETS BY MOUTH EVERY DAY, Disp: 180 tablet, Rfl: 3   omeprazole  (PRILOSEC) 40 MG capsule, TAKE 1 CAPSULE BY MOUTH EVERY DAY, Disp: 90 capsule, Rfl: 3   promethazine -dextromethorphan (PROMETHAZINE -DM) 6.25-15 MG/5ML syrup, Take 10 mLs by mouth 3 (three) times daily as needed., Disp: 240 mL, Rfl: 0   rosuvastatin  (CRESTOR ) 40 MG tablet, TAKE 1 TABLET BY MOUTH EVERY DAY, Disp: 90 tablet, Rfl: 3   SITagliptin  (ZITUVIO ) 100 MG TABS, Take 100 mg by mouth daily., Disp: 90 tablet, Rfl: 3   VALIUM 5 MG tablet, SMARTSIG:2 Tablet(s) By Mouth (Patient not taking: Reported on 01/14/2024), Disp: , Rfl:    Allergies  Allergen Reactions   Sulfa Antibiotics Hives and Swelling   Vicodin [Hydrocodone -Acetaminophen ] Itching and Other (See Comments)    Patient states he can take Tylenol  without difficulty.    Past Medical History:  Diagnosis Date   Anginal pain    Chest pain 02/13/2012   echo - EF >55%;mod concentric L ventricular hypertrophy; atrial septum aneurysmal; patent foramen ovale suspected on color flow, LA mil/mod dilation; RV systolic pressure   Chest pain 06/09/2007   echo - EF 45-50%; interarterial shunt; mild valvular regurgitation;    Chest pain 06/09/2007   Myoview - EF 53%; normal perfusion all regions;EKG negative for ischemia    Chronic kidney disease    Diabetes mellitus    type II   DIABETES MELLITUS, TYPE II 08/29/2009   GERD (gastroesophageal reflux disease) 08/12/2010   Hemorrhoids    History of anal fissures  Hyperlipidemia    HYPERLIPIDEMIA 08/29/2009   Hypertension    HYPERTENSION 01/14/2008   Lightheadedness 05/10/2002   30d monitor unremarkable   LVH (left ventricular hypertrophy)    with systolic dysfunction by echo 10/03, EF 45-50% - Dr. Lavon   Multinodular goiter 12/06/2015   Nonspecific ST-T wave electrocardiographic changes 08/11/2006   Myoview - EF 49%; normal perfusion all regions, EKG negative for ischemia   OSA (obstructive sleep  apnea)    Preventative health care 08/11/2010   Sleep apnea 08/13/2008   AHI 1.2; sleep study 10/2007 - AHI 9.47at 15cm H2O and 31.3 at 16cm H2O   SLEEP APNEA, OBSTRUCTIVE 01/14/2008     Past Surgical History:  Procedure Laterality Date   ANAL SPHINCTEROTOMY  08/20/11   CARDIAC CATHETERIZATION  10/23/2008   normal coronary arteries and LV function   COLONOSCOPY     IR RADIOLOGIST EVAL & MGMT  04/12/2021   IR RADIOLOGIST EVAL & MGMT  08/13/2021   IR RADIOLOGIST EVAL & MGMT  09/11/2021   IR RADIOLOGIST EVAL & MGMT  12/04/2021   IR RADIOLOGIST EVAL & MGMT  03/06/2022   IR RADIOLOGIST EVAL & MGMT  09/18/2023   POLYPECTOMY     RADIOLOGY WITH ANESTHESIA N/A 05/15/2021   Procedure: MICROWAVE ABLATION;  Surgeon: Hughes Simmonds, MD;  Location: [facility];  Service: Radiology;  Laterality: N/A;   RADIOLOGY WITH ANESTHESIA Right 11/06/2021   Procedure: IR WITH ANESTHESIA RIGHT RENAL CRYOABLATION;  Surgeon: Hughes Simmonds, MD;  Location: [facility];  Service: Radiology;  Laterality: Right;    Family History  Problem Relation Age of Onset   Hypertension Mother    Heart disease Father    Colon cancer Neg Hx    Thyroid  disease Neg Hx    Colon polyps Neg Hx    Esophageal cancer Neg Hx    Rectal cancer Neg Hx    Stomach cancer Neg Hx     Social History   Tobacco Use   Smoking status: Every Day    Current packs/day: 0.50    Average packs/day: 0.5 packs/day for 30.0 years (15.0 ttl pk-yrs)    Types: Cigarettes    Passive exposure: Current   Smokeless tobacco: Never  Vaping Use   Vaping status: Never Used  Substance Use Topics   Alcohol use: Yes    Alcohol/week: 3.0 standard drinks of alcohol    Types: 3 Standard drinks or equivalent per week    Comment: social   Drug use: No    ROS Refer to HPI for ROS details.  Objective:   Vitals: BP (!) 140/85 (BP Location: Left Arm)   Pulse (!) 58   Temp 98.1 F (36.7 C) (Oral)   Resp 20   Ht 6' 1 (1.854 m)   Wt 174 lb (78.9 kg)   SpO2 96%   BMI 22.96  kg/m   Physical Exam Vitals and nursing note reviewed.  Constitutional:      General: He is not in acute distress.    Appearance: Normal appearance. He is well-developed. He is not ill-appearing or toxic-appearing.  HENT:     Head: Normocephalic.     Nose: Congestion present. No rhinorrhea.     Mouth/Throat:     Mouth: Mucous membranes are moist.     Pharynx: Oropharynx is clear. Posterior oropharyngeal erythema present. No oropharyngeal exudate.  Eyes:     General:        Right eye: No discharge.        Left  eye: No discharge.     Extraocular Movements: Extraocular movements intact.     Conjunctiva/sclera: Conjunctivae normal.  Cardiovascular:     Rate and Rhythm: Normal rate.  Pulmonary:     Effort: Pulmonary effort is normal. No respiratory distress.     Breath sounds: No stridor. No wheezing.  Chest:     Chest wall: No tenderness.  Skin:    General: Skin is warm and dry.  Neurological:     General: No focal deficit present.     Mental Status: He is alert and oriented to person, place, and time.  Psychiatric:        Mood and Affect: Mood normal.        Behavior: Behavior normal.     Procedures  No results found for this or any previous visit (from the past 24 hours).  No results found.   Assessment and Plan :     Discharge Instructions       1. Postviral syndrome (Primary) - azelastine (ASTELIN) 0.1 % nasal spray; Place 1 spray into both nostrils 2 (two) times daily. Use in each nostril as directed  Dispense: 30 mL; Refill: 1 - promethazine -dextromethorphan (PROMETHAZINE -DM) 6.25-15 MG/5ML syrup; Take 10 mLs by mouth 3 (three) times daily as needed.  Dispense: 240 mL; Refill: 0 -Continue to monitor symptoms for any change in severity if there is any escalation of current symptoms or development of new symptoms follow-up in ER for further evaluation and management.       Kenneth Lax B Ranette Luckadoo   Lakiyah Arntson, [place], [place] 01/23/24 [phone]

## 2024-01-23 NOTE — Discharge Instructions (Signed)
  1. Postviral syndrome (Primary) - azelastine (ASTELIN) 0.1 % nasal spray; Place 1 spray into both nostrils 2 (two) times daily. Use in each nostril as directed  Dispense: 30 mL; Refill: 1 - promethazine -dextromethorphan (PROMETHAZINE -DM) 6.25-15 MG/5ML syrup; Take 10 mLs by mouth 3 (three) times daily as needed.  Dispense: 240 mL; Refill: 0 -Continue to monitor symptoms for any change in severity if there is any escalation of current symptoms or development of new symptoms follow-up in ER for further evaluation and management.

## 2024-02-18 LAB — LAB REPORT - SCANNED: EGFR: 40

## 2024-02-22 ENCOUNTER — Other Ambulatory Visit: Payer: Self-pay | Admitting: Internal Medicine

## 2024-02-22 NOTE — Telephone Encounter (Unsigned)
 Copied from CRM #[id number]. Topic: Clinical - Medication Refill >> Feb 22, 2024  8:07 AM Tiffini S wrote: Medication: amLODipine -benazepril  (LOTREL) 10-40 MG capsule  Has the patient contacted their pharmacy? Yes (Agent: If no, request that the patient contact the pharmacy for the refill. If patient does not wish to contact the pharmacy document the reason why and proceed with request.) (Agent: If yes, when and what did the pharmacy advise?)  This is the patient's preferred pharmacy:  [facility] [phone] [address], [place] - [address] [place] [address] Phone: [phone] Fax: [phone]  Is this the correct pharmacy for this prescription? Yes If no, delete pharmacy and type the correct one.   Has the prescription been filled recently? Yes  Is the patient out of the medication? Yes  Has the patient been seen for an appointment in the last year OR does the patient have an upcoming appointment? Yes  Can we respond through MyChart? Yes  Agent: Please be advised that Rx refills may take up to 3 business days. We ask that you follow-up with your pharmacy.

## 2024-02-22 NOTE — Telephone Encounter (Unsigned)
 Copied from CRM [id number]. Topic: Clinical - Medication Refill >> Feb 22, 2024  4:01 PM Brandy W wrote: Medication: amLODipine -benazepril  (LOTREL) 10-40 MG capsule  Has the patient contacted their pharmacy? Yes (Agent: If no, request that the patient contact the pharmacy for the refill. If patient does not wish to contact the pharmacy document the reason why and proceed with request.) (Agent: If yes, when and what did the pharmacy advise?)  This is the patient's preferred pharmacy:  [facility] [phone] [address], [place] - [address] [place] [address] Phone: [phone] Fax: [phone]  Is this the correct pharmacy for this prescription? Yes If no, delete pharmacy and type the correct one.   Has the prescription been filled recently? Yes  Is the patient out of the medication? Yes  Has the patient been seen for an appointment in the last year OR does the patient have an upcoming appointment? Yes  Can we respond through MyChart? Yes  Agent: Please be advised that Rx refills may take up to 3 business days. We ask that you follow-up with your pharmacy.

## 2024-02-23 ENCOUNTER — Ambulatory Visit: Admitting: Family Medicine

## 2024-02-23 MED ORDER — AMLODIPINE BESY-BENAZEPRIL HCL 10-40 MG PO CAPS: ORAL_CAPSULE | ORAL | 3 refills | Status: AC

## 2024-04-11 ENCOUNTER — Encounter: Payer: Self-pay | Admitting: Emergency Medicine

## 2024-04-11 ENCOUNTER — Ambulatory Visit
Admission: EM | Admit: 2024-04-11 | Discharge: 2024-04-11 | Disposition: A | Discharge status: Home / Self Care | Attending: Nurse Practitioner | Admitting: Nurse Practitioner

## 2024-04-11 ENCOUNTER — Telehealth: Payer: Self-pay | Admitting: Nurse Practitioner

## 2024-04-11 DIAGNOSIS — U071 COVID-19: Secondary | ICD-10-CM

## 2024-04-11 DIAGNOSIS — R509 Fever, unspecified: Secondary | ICD-10-CM | POA: Diagnosis not present

## 2024-04-11 LAB — POCT INFLUENZA A/B
Influenza A, POC: NEGATIVE
Influenza B, POC: NEGATIVE

## 2024-04-11 LAB — POC SOFIA SARS ANTIGEN FIA: SARS Coronavirus 2 Ag: POSITIVE — AB

## 2024-04-11 MED ORDER — PAXLOVID (150/100) 10 X 150 MG & 10 X 100MG PO TBPK: 2.0000 | ORAL_TABLET | Freq: Two times a day (BID) | ORAL | 0 refills | Status: AC

## 2024-04-11 MED ORDER — PAXLOVID (300/100) 20 X 150 MG & 10 X 100MG PO TBPK: 3.0000 | ORAL_TABLET | Freq: Two times a day (BID) | ORAL | 0 refills | Status: DC

## 2024-04-11 NOTE — ED Triage Notes (Addendum)
 Pt c/o fever, cough, runny nose, and fatigue for 1 day.  He has been visiting his mother in the hospital who has COVID. He did COVID test Friday before symptoms began just to check for COVID. Test was negative.

## 2024-04-11 NOTE — ED Provider Notes (Addendum)
 [name]    CSN: [id number] Arrival date & time: 04/11/24  9180      History   Chief Complaint No chief complaint on file.   HPI [name] is a 64 y.o. male.   Discussed the use of AI scribe software for clinical note transcription with the patient, who gave verbal consent to proceed.   [name] presents with symptoms concerning for COVID-19 that began yesterday. He reports known exposure, as his mother is currently hospitalized for COVID-related illness and was admitted on Friday. He performed a home COVID test on Friday that was negative at that time. Yesterday, he developed fever with a maximum temperature of 102.26F, accompanied by chills and generalized body aches. He also endorses rhinorrhea, nasal congestion, sneezing, and cough. He denies wheezing, shortness of breath, nausea, vomiting, or diarrhea. He has been taking Coricidin for symptomatic relief.  His past medical history is significant for gout, hypertension, hyperlipidemia, gastroesophageal reflux disease, and chronic kidney issues. Current medications include colchicine  for gout and rosuvastatin  for cholesterol management. He has known allergies to sulfa antibiotics and Vicodin. He reports having received two prior COVID-19 vaccinations.  The following sections of the patient's history were reviewed and updated as appropriate: allergies, current medications, past family history, past medical history, past social history, past surgical history, and problem list.     Past Medical History:  Diagnosis Date   Anginal pain    Chest pain 02/13/2012   echo - EF >55%;mod concentric L ventricular hypertrophy; atrial septum aneurysmal; patent foramen ovale suspected on color flow, LA mil/mod dilation; RV systolic pressure   Chest pain 06/09/2007   echo - EF 45-50%; interarterial shunt; mild valvular regurgitation;    Chest pain 06/09/2007   Myoview - EF 53%; normal perfusion all  regions;EKG negative for ischemia    Chronic kidney disease    Diabetes mellitus    type II   DIABETES MELLITUS, TYPE II 08/29/2009   GERD (gastroesophageal reflux disease) 08/12/2010   Hemorrhoids    History of anal fissures    Hyperlipidemia    HYPERLIPIDEMIA 08/29/2009   Hypertension    HYPERTENSION 01/14/2008   Lightheadedness 05/10/2002   30d monitor unremarkable   LVH (left ventricular hypertrophy)    with systolic dysfunction by echo 10/03, EF 45-50% - Dr. Lavon   Multinodular goiter 12/06/2015   Nonspecific ST-T wave electrocardiographic changes 08/11/2006   Myoview - EF 49%; normal perfusion all regions, EKG negative for ischemia   OSA (obstructive sleep apnea)    Preventative health care 08/11/2010   Sleep apnea 08/13/2008   AHI 1.2; sleep study 10/2007 - AHI 9.47at 15cm H2O and 31.3 at 16cm H2O   SLEEP APNEA, OBSTRUCTIVE 01/14/2008    Patient Active Problem List   Diagnosis Date Noted   Anemia 12/08/2022   Near syncope 11/05/2022   Hypomagnesemia 11/05/2022   Acute kidney injury (AKI) with acute tubular necrosis (ATN) 11/04/2022   Nocturia 11/03/2022   Intractable acute post-traumatic headache 11/03/2022   Acute renal failure superimposed on chronic kidney disease 11/03/2022   CKD (chronic kidney disease) stage 3, GFR 30-59 ml/min (HCC) 09/19/2022   B12 deficiency 09/19/2022   Hypokalemia 09/19/2022   Screen for colon cancer 03/21/2022   History of colon polyps 03/21/2022   Right renal mass 11/06/2021   Renal cancer, right (HCC) 11/06/2021   Hyperthyroidism 06/19/2021   Left renal mass 05/15/2021   Encounter for ablation of neoplasm of liver 05/15/2021   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   Hallux limitus of left foot 07/17/2020   Capsulitis 09/13/2019   Acute gouty arthritis 08/02/2019   Dysphagia 12/20/2018   Bursitis of right elbow 09/14/2018   Encounter for well adult exam with abnormal findings 12/10/2017   Multinodular goiter  12/06/2015   Loss of weight 11/21/2014   Tobacco abuse 08/16/2012   Anal fissure 06/19/2011   Hemorrhoids 06/12/2011   GERD (gastroesophageal reflux disease) 08/12/2010   Diabetes (HCC) 08/29/2009   Hyperlipidemia 08/29/2009   SLEEP APNEA, OBSTRUCTIVE 01/14/2008   Essential hypertension 01/14/2008    Past Surgical History:  Procedure Laterality Date   ANAL SPHINCTEROTOMY  08/20/11   CARDIAC CATHETERIZATION  10/23/2008   normal coronary arteries and LV function   COLONOSCOPY     IR RADIOLOGIST EVAL & MGMT  04/12/2021   IR RADIOLOGIST EVAL & MGMT  08/13/2021   IR RADIOLOGIST EVAL & MGMT  09/11/2021   IR RADIOLOGIST EVAL & MGMT  12/04/2021   IR RADIOLOGIST EVAL & MGMT  03/06/2022   IR RADIOLOGIST EVAL & MGMT  09/18/2023   POLYPECTOMY     RADIOLOGY WITH ANESTHESIA N/A 05/15/2021   Procedure: MICROWAVE ABLATION;  Surgeon: Hughes Simmonds, MD;  Location: [facility];  Service: Radiology;  Laterality: N/A;   RADIOLOGY WITH ANESTHESIA Right 11/06/2021   Procedure: IR WITH ANESTHESIA RIGHT RENAL CRYOABLATION;  Surgeon: Hughes Simmonds, MD;  Location: [facility];  Service: Radiology;  Laterality: Right;       Home Medications    Prior to Admission medications  Medication Sig Start Date End Date Taking? Authorizing Provider  nirmatrelvir/ritonavir (PAXLOVID , 300/100,) 20 x 150 MG & 10 x 100MG  TBPK Take 3 tablets by mouth 2 (two) times daily for 5 days. Patient GFR is 37.82. Take nirmatrelvir (150 mg) two tablets twice daily for 5 days and ritonavir (100 mg) one tablet twice daily for 5 days. 04/11/24 04/16/24 Yes Iola Lukes, FNP  albuterol  (VENTOLIN  HFA) 108 (90 Base) MCG/ACT inhaler Inhale 2 puffs into the lungs every 4 (four) hours as needed for wheezing or shortness of breath. 01/14/24   Vonna Sharlet POUR, MD  allopurinol  (ZYLOPRIM ) 100 MG tablet Take 1 tablet (100 mg total) by mouth daily. 05/28/23   Norleen Lynwood ORN, MD  amLODipine -benazepril  (LOTREL) 10-40 MG capsule TAKE 1 CAPSULE BY MOUTH EVERY DAY  02/23/24   Norleen Lynwood ORN, MD  azelastine  (ASTELIN ) 0.1 % nasal spray Place 1 spray into both nostrils 2 (two) times daily. Use in each nostril as directed 01/23/24   Reddick, Johnathan B, NP  benzonatate  (TESSALON ) 100 MG capsule Take 1 capsule (100 mg total) by mouth 3 (three) times daily as needed for cough. 01/14/24   Vonna Sharlet POUR, MD  [Paused] colchicine  0.6 MG tablet Take 1 tablet (0.6 mg total) by mouth 2 (two) times daily. Wait to take this until: April 18, 2024 Morning 05/28/23   Norleen Lynwood ORN, MD  empagliflozin  (JARDIANCE ) 10 MG TABS tablet Take 1 tablet (10 mg total) by mouth daily before breakfast. 05/14/23   Norleen Lynwood ORN, MD  hydrALAZINE  (APRESOLINE ) 50 MG tablet TAKE 1 TABLET BY MOUTH THREE TIMES A DAY 09/08/23   Norleen Lynwood ORN, MD  nebivolol  (BYSTOLIC ) 10 MG tablet TAKE 2 TABLETS BY MOUTH EVERY DAY 09/08/23   Norleen Lynwood ORN, MD  omeprazole  (PRILOSEC) 40 MG capsule TAKE 1 CAPSULE BY MOUTH EVERY DAY 09/08/23   Norleen Lynwood ORN, MD  [Paused] rosuvastatin  (CRESTOR ) 40 MG tablet TAKE 1 TABLET BY  MOUTH EVERY DAY Wait to take this until: April 18, 2024 Evening 09/08/23   Norleen Lynwood ORN, MD  SITagliptin  (ZITUVIO ) 100 MG TABS Take 100 mg by mouth daily. 03/20/23   Norleen Lynwood ORN, MD  VALIUM 5 MG tablet SMARTSIG:2 Tablet(s) By Mouth Patient not taking: Reported on 01/14/2024 07/09/23   [provider]    Family History Family History  Problem Relation Age of Onset   Hypertension Mother    Heart disease Father    Colon cancer Neg Hx    Thyroid  disease Neg Hx    Colon polyps Neg Hx    Esophageal cancer Neg Hx    Rectal cancer Neg Hx    Stomach cancer Neg Hx     Social History Social History[1]   Allergies   Sulfa antibiotics and Vicodin [hydrocodone -acetaminophen ]   Review of Systems Review of Systems  Constitutional:  Positive for chills and fever (TMAX 102.6).  HENT:  Positive for congestion, rhinorrhea, sneezing and sore throat.   Respiratory:  Positive for cough.  Negative for shortness of breath and wheezing.   Gastrointestinal:  Negative for diarrhea, nausea and vomiting.  Musculoskeletal:  Positive for myalgias.  Neurological:  Positive for headaches.  All other systems reviewed and are negative.    Physical Exam Triage Vital Signs ED Triage Vitals  Encounter Vitals Group     BP 04/11/24 0828 123/77     Girls Systolic BP Percentile --      Girls Diastolic BP Percentile --      Boys Systolic BP Percentile --      Boys Diastolic BP Percentile --      Pulse Rate 04/11/24 0828 76     Resp 04/11/24 0828 17     Temp 04/11/24 0828 97.9 F (36.6 C)     Temp Source 04/11/24 0828 Oral     SpO2 04/11/24 0828 95 %     Weight --      Height --      Head Circumference --      Peak Flow --      Pain Score 04/11/24 0831 5     Pain Loc --      Pain Education --      Exclude from Growth Chart --    No data found.  Updated Vital Signs BP 123/77 (BP Location: Right Arm)   Pulse 76   Temp 97.9 F (36.6 C) (Oral)   Resp 17   SpO2 95%   Visual Acuity Right Eye Distance:   Left Eye Distance:   Bilateral Distance:    Right Eye Near:   Left Eye Near:    Bilateral Near:     Physical Exam Vitals reviewed.  Constitutional:      General: He is awake. He is not in acute distress.    Appearance: Normal appearance. He is well-developed. He is not ill-appearing, toxic-appearing or diaphoretic.  HENT:     Head: Normocephalic.     Right Ear: Hearing, tympanic membrane, ear canal and external ear normal. No drainage, swelling or tenderness. No middle ear effusion. Tympanic membrane is not erythematous.     Left Ear: Hearing, tympanic membrane, ear canal and external ear normal. No drainage, swelling or tenderness.  No middle ear effusion. Tympanic membrane is not erythematous.     Nose: Congestion present.     Mouth/Throat:     Lips: Pink.     Mouth: Mucous membranes are moist.     Pharynx: Oropharynx is clear.  Uvula midline. No pharyngeal  swelling, oropharyngeal exudate, posterior oropharyngeal erythema or uvula swelling.     Tonsils: No tonsillar exudate or tonsillar abscesses.  Eyes:     General: Vision grossly intact.     Conjunctiva/sclera: Conjunctivae normal.  Cardiovascular:     Rate and Rhythm: Normal rate and regular rhythm.     Heart sounds: Normal heart sounds.  Pulmonary:     Effort: Pulmonary effort is normal. No tachypnea or respiratory distress.     Breath sounds: Normal breath sounds and air entry.     Comments: Respirations even and unlabored  Musculoskeletal:        General: Normal range of motion.     Cervical back: Full passive range of motion without pain, normal range of motion and neck supple.  Lymphadenopathy:     Cervical: No cervical adenopathy.  Skin:    General: Skin is warm and dry.  Neurological:     General: No focal deficit present.     Mental Status: He is alert and oriented to person, place, and time.  Psychiatric:        Speech: Speech normal.        Behavior: Behavior is cooperative.      Perry Treatments / Results  Labs (all labs ordered are listed, but only abnormal results are displayed) Labs Reviewed  POC SOFIA SARS ANTIGEN FIA - Abnormal; Notable for the following components:      Result Value   SARS Coronavirus 2 Ag Positive (*)    All other components within normal limits  POCT INFLUENZA A/B - Normal    EKG   Radiology No results found.  Procedures Procedures (including critical care time)  Medications Ordered in Perry Medications - No data to display  Initial Impression / Assessment and Plan / Perry Course  I have reviewed the triage vital signs and the nursing notes.  Pertinent labs & imaging results that were available during my care of the patient were reviewed by me and considered in my medical decision making (see chart for details).     The patient presents with symptoms of an acute respiratory illness and tested positive for COVID-19. His medical  history is significant for hypertension, diabetes, hyperlipidemia, gout, and gastroesophageal reflux disease, placing him at increased risk for complications. Review of his most recent laboratory studies from 08/2023 shows a creatinine of 1.86 mg/dL, estimated GFR of 62.17 mL/min, and A1C of 6.2%.  Based on symptom onset, risk factors, and renal function with GFR greater than 30 mL/min, the patient meets criteria for treatment with Paxlovid  at renal adjusted dosing. Due to known drug-drug interactions, colchicine  and rosuvastatin  should be held during Paxlovid  therapy and may be safely resumed on 04/19/23. All other prescribed medications for chronic conditions may be continued without interruption.  Supportive care was discussed, including maintaining adequate hydration, getting sufficient rest, and continuing Coricidin as needed for symptom relief. Education was provided regarding the current clinical course of COVID-19, which is generally less severe than earlier in the pandemic due to population immunity and viral evolution. The patient was counseled on updated CDC guidance indicating that strict five-day isolation is no longer required; return to normal activities is appropriate once symptoms are improving overall and he has been fever-free for at least 24 hours without the use of fever-reducing medications.  The patient was advised to closely monitor symptoms over the next several days and to follow up with his primary care provider if symptoms do not improve within  one week. He was instructed to seek emergency care for shortness of breath, chest pain, dizziness, syncope, confusion, severe weakness, or inability to tolerate oral fluids.  Today's evaluation has revealed no signs of a dangerous process. Discussed diagnosis with patient and/or guardian. Patient and/or guardian aware of their diagnosis, possible red flag symptoms to watch out for and need for close follow up. Patient and/or guardian  understands verbal and written discharge instructions. Patient and/or guardian comfortable with plan and disposition.  Patient and/or guardian has a clear mental status at this time, good insight into illness (after discussion and teaching) and has clear judgment to make decisions regarding their care  Documentation was completed with the aid of voice recognition software. Transcription may contain typographical errors.  Final Clinical Impressions(s) / Perry Diagnoses   Final diagnoses:  Fever, unspecified  COVID-19     Discharge Instructions      You tested positive for COVID-19, which is a viral infection and does not require antibiotics. You were prescribed Paxlovid , an antiviral medication used to treat mild to moderate COVID-19. Take it exactly as prescribed and do not skip doses. Do not take your colchicine  or rosuvastatin  while on the Paxlovid . You may resume these two medications on Monday January 19th. You can take all of your other prescription medications with the Paxlovid  except the ccolchicine  and rosuvastatin . Possible side effects include a metallic taste, diarrhea, muscle aches, or mild increases in blood pressure, which usually resolve after treatment. You may use acetaminophen  or for fever, headache, or body aches, and it is important to rest and stay well hydrated.  COVID-19 is generally less severe now than earlier in the pandemic, and current guidance allows return to normal activities once you have been fever-free for at least 24 hours without medication and your symptoms are mild and improving. Monitor your symptoms closely. Follow up with your primary care provider if you are not improving within a week. Go to the emergency department right away if you develop shortness of breath, chest pain, dizziness, confusion, severe weakness, fainting, or are unable to keep fluids down.      ED Prescriptions     Medication Sig Dispense Auth. Provider   nirmatrelvir/ritonavir  (PAXLOVID , 300/100,) 20 x 150 MG & 10 x 100MG  TBPK Take 3 tablets by mouth 2 (two) times daily for 5 days. Patient GFR is 37.82. Take nirmatrelvir (150 mg) two tablets twice daily for 5 days and ritonavir (100 mg) one tablet twice daily for 5 days. 30 tablet Iola Lukes, FNP      PDMP not reviewed this encounter.    Iola Lukes, [place] 04/11/24 9078     [1]  Social History Tobacco Use   Smoking status: Every Day    Current packs/day: 0.50    Average packs/day: 0.5 packs/day for 30.0 years (15.0 ttl pk-yrs)    Types: Cigarettes    Passive exposure: Current   Smokeless tobacco: Never  Vaping Use   Vaping status: Never Used  Substance Use Topics   Alcohol use: Yes    Alcohol/week: 3.0 standard drinks of alcohol    Types: 3 Standard drinks or equivalent per week    Comment: social   Drug use: No     Iola Lukes, FNP 04/11/24 1218  "

## 2024-04-11 NOTE — Discharge Instructions (Addendum)
 You tested positive for COVID-19, which is a viral infection and does not require antibiotics. You were prescribed Paxlovid , an antiviral medication used to treat mild to moderate COVID-19. Take it exactly as prescribed and do not skip doses. Do not take your colchicine  or rosuvastatin  while on the Paxlovid . You may resume these two medications on Monday January 19th. You can take all of your other prescription medications with the Paxlovid  except the ccolchicine  and rosuvastatin . Possible side effects include a metallic taste, diarrhea, muscle aches, or mild increases in blood pressure, which usually resolve after treatment. You may use acetaminophen  or for fever, headache, or body aches, and it is important to rest and stay well hydrated.  COVID-19 is generally less severe now than earlier in the pandemic, and current guidance allows return to normal activities once you have been fever-free for at least 24 hours without medication and your symptoms are mild and improving. Monitor your symptoms closely. Follow up with your primary care provider if you are not improving within a week. Go to the emergency department right away if you develop shortness of breath, chest pain, dizziness, confusion, severe weakness, fainting, or are unable to keep fluids down.

## 2024-06-14 ENCOUNTER — Ambulatory Visit: Admitting: Family Medicine
